# Patient Record
Sex: Female | Born: 1935 | Race: Black or African American | Hispanic: No | Smoking: Never smoker
Health system: Southern US, Community
[De-identification: ages and names within clinical notes are randomized; demographics above are authoritative.]

## PROBLEM LIST (undated history)

## (undated) DIAGNOSIS — K219 Gastro-esophageal reflux disease without esophagitis: Secondary | ICD-10-CM

## (undated) DIAGNOSIS — D649 Anemia, unspecified: Secondary | ICD-10-CM

## (undated) DIAGNOSIS — I2510 Atherosclerotic heart disease of native coronary artery without angina pectoris: Secondary | ICD-10-CM

## (undated) DIAGNOSIS — E7800 Pure hypercholesterolemia, unspecified: Secondary | ICD-10-CM

## (undated) DIAGNOSIS — I739 Peripheral vascular disease, unspecified: Secondary | ICD-10-CM

## (undated) DIAGNOSIS — K449 Diaphragmatic hernia without obstruction or gangrene: Secondary | ICD-10-CM

## (undated) DIAGNOSIS — F419 Anxiety disorder, unspecified: Secondary | ICD-10-CM

## (undated) DIAGNOSIS — K5730 Diverticulosis of large intestine without perforation or abscess without bleeding: Secondary | ICD-10-CM

## (undated) DIAGNOSIS — I4892 Unspecified atrial flutter: Secondary | ICD-10-CM

## (undated) DIAGNOSIS — I219 Acute myocardial infarction, unspecified: Secondary | ICD-10-CM

## (undated) DIAGNOSIS — J189 Pneumonia, unspecified organism: Secondary | ICD-10-CM

## (undated) DIAGNOSIS — I10 Essential (primary) hypertension: Secondary | ICD-10-CM

## (undated) DIAGNOSIS — R0602 Shortness of breath: Secondary | ICD-10-CM

## (undated) DIAGNOSIS — M1990 Unspecified osteoarthritis, unspecified site: Secondary | ICD-10-CM

## (undated) DIAGNOSIS — Z9289 Personal history of other medical treatment: Secondary | ICD-10-CM

## (undated) DIAGNOSIS — K635 Polyp of colon: Secondary | ICD-10-CM

## (undated) HISTORY — DX: Essential (primary) hypertension: I10

## (undated) HISTORY — DX: Anxiety disorder, unspecified: F41.9

## (undated) HISTORY — DX: Unspecified atrial flutter: I48.92

## (undated) HISTORY — DX: Diverticulosis of large intestine without perforation or abscess without bleeding: K57.30

## (undated) HISTORY — DX: Pure hypercholesterolemia, unspecified: E78.00

## (undated) HISTORY — DX: Peripheral vascular disease, unspecified: I73.9

## (undated) HISTORY — DX: Diaphragmatic hernia without obstruction or gangrene: K44.9

## (undated) HISTORY — DX: Anemia, unspecified: D64.9

## (undated) HISTORY — PX: HERNIA REPAIR: SHX51

## (undated) HISTORY — PX: DILATION AND CURETTAGE OF UTERUS: SHX78

## (undated) HISTORY — DX: Atherosclerotic heart disease of native coronary artery without angina pectoris: I25.10

## (undated) HISTORY — DX: Polyp of colon: K63.5

## (undated) HISTORY — DX: Unspecified osteoarthritis, unspecified site: M19.90

## (undated) HISTORY — PX: FRACTURE SURGERY: SHX138

---

## 1997-10-30 ENCOUNTER — Emergency Department (HOSPITAL_COMMUNITY): Admission: EM | Admit: 1997-10-30 | Discharge: 1997-10-30 | Payer: Self-pay | Admitting: Emergency Medicine

## 1998-06-14 ENCOUNTER — Ambulatory Visit (HOSPITAL_COMMUNITY): Admission: RE | Admit: 1998-06-14 | Discharge: 1998-06-14 | Payer: Self-pay | Admitting: Pulmonary Disease

## 1998-06-14 ENCOUNTER — Encounter: Payer: Self-pay | Admitting: Pulmonary Disease

## 1998-08-08 ENCOUNTER — Encounter (INDEPENDENT_AMBULATORY_CARE_PROVIDER_SITE_OTHER): Payer: Self-pay | Admitting: *Deleted

## 1998-11-06 ENCOUNTER — Encounter: Payer: Self-pay | Admitting: Emergency Medicine

## 1998-11-06 ENCOUNTER — Emergency Department (HOSPITAL_COMMUNITY): Admission: EM | Admit: 1998-11-06 | Discharge: 1998-11-06 | Payer: Self-pay | Admitting: *Deleted

## 1998-11-07 ENCOUNTER — Encounter: Payer: Self-pay | Admitting: Pulmonary Disease

## 1998-11-07 ENCOUNTER — Inpatient Hospital Stay (HOSPITAL_COMMUNITY): Admission: EM | Admit: 1998-11-07 | Discharge: 1998-11-11 | Payer: Self-pay | Admitting: Pulmonary Disease

## 1999-11-26 ENCOUNTER — Encounter: Payer: Self-pay | Admitting: Emergency Medicine

## 1999-11-26 ENCOUNTER — Emergency Department (HOSPITAL_COMMUNITY): Admission: EM | Admit: 1999-11-26 | Discharge: 1999-11-26 | Payer: Self-pay

## 2001-11-30 ENCOUNTER — Other Ambulatory Visit: Admission: RE | Admit: 2001-11-30 | Discharge: 2001-11-30 | Payer: Self-pay | Admitting: Obstetrics & Gynecology

## 2001-12-01 ENCOUNTER — Encounter: Admission: RE | Admit: 2001-12-01 | Discharge: 2001-12-01 | Payer: Self-pay | Admitting: Pulmonary Disease

## 2001-12-01 ENCOUNTER — Encounter: Payer: Self-pay | Admitting: Pulmonary Disease

## 2004-05-21 ENCOUNTER — Ambulatory Visit: Payer: Self-pay | Admitting: Pulmonary Disease

## 2004-10-29 ENCOUNTER — Ambulatory Visit: Payer: Self-pay | Admitting: Pulmonary Disease

## 2004-11-26 ENCOUNTER — Ambulatory Visit: Payer: Self-pay | Admitting: Pulmonary Disease

## 2004-12-18 ENCOUNTER — Ambulatory Visit: Payer: Self-pay | Admitting: Gastroenterology

## 2005-01-15 ENCOUNTER — Ambulatory Visit: Payer: Self-pay | Admitting: Gastroenterology

## 2005-01-15 ENCOUNTER — Encounter (INDEPENDENT_AMBULATORY_CARE_PROVIDER_SITE_OTHER): Payer: Self-pay | Admitting: *Deleted

## 2005-01-15 ENCOUNTER — Encounter (INDEPENDENT_AMBULATORY_CARE_PROVIDER_SITE_OTHER): Payer: Self-pay | Admitting: Specialist

## 2005-01-17 ENCOUNTER — Ambulatory Visit: Payer: Self-pay | Admitting: Pulmonary Disease

## 2005-03-08 ENCOUNTER — Ambulatory Visit: Payer: Self-pay | Admitting: Pulmonary Disease

## 2005-09-23 ENCOUNTER — Ambulatory Visit: Payer: Self-pay | Admitting: Pulmonary Disease

## 2006-03-06 ENCOUNTER — Inpatient Hospital Stay (HOSPITAL_COMMUNITY): Admission: AD | Admit: 2006-03-06 | Discharge: 2006-03-11 | Payer: Self-pay | Admitting: Pulmonary Disease

## 2006-03-06 ENCOUNTER — Encounter: Payer: Self-pay | Admitting: Cardiology

## 2006-03-06 ENCOUNTER — Ambulatory Visit: Payer: Self-pay | Admitting: Pulmonary Disease

## 2006-03-06 ENCOUNTER — Ambulatory Visit: Payer: Self-pay | Admitting: Cardiology

## 2006-03-06 ENCOUNTER — Encounter: Payer: Self-pay | Admitting: Vascular Surgery

## 2006-03-18 ENCOUNTER — Ambulatory Visit: Payer: Self-pay | Admitting: Pulmonary Disease

## 2006-05-02 ENCOUNTER — Ambulatory Visit: Payer: Self-pay | Admitting: Pulmonary Disease

## 2006-05-02 LAB — CONVERTED CEMR LAB
Albumin: 3.5 g/dL (ref 3.5–5.2)
Alkaline Phosphatase: 99 units/L (ref 39–117)
BUN: 14 mg/dL (ref 6–23)
Basophils Relative: 0.4 % (ref 0.0–1.0)
Cholesterol: 154 mg/dL (ref 0–200)
Creatinine, Ser: 0.7 mg/dL (ref 0.4–1.2)
Eosinophils Relative: 1.3 % (ref 0.0–5.0)
GFR calc Af Amer: 104 mL/min
GFR calc non Af Amer: 86 mL/min
HCT: 33.4 % — ABNORMAL LOW (ref 36.0–46.0)
Hemoglobin: 10.9 g/dL — ABNORMAL LOW (ref 12.0–15.0)
LDL Cholesterol: 84 mg/dL (ref 0–99)
MCHC: 32.6 g/dL (ref 30.0–36.0)
Neutrophils Relative %: 50.9 % (ref 43.0–77.0)
Potassium: 4 meq/L (ref 3.5–5.1)
RDW: 18.7 % — ABNORMAL HIGH (ref 11.5–14.6)
Saturation Ratios: 6.4 % — ABNORMAL LOW (ref 20.0–50.0)
Transferrin: 390.4 mg/dL — ABNORMAL HIGH (ref >=212.0)
WBC: 7.8 10*3/uL (ref 4.5–10.5)

## 2006-05-13 ENCOUNTER — Ambulatory Visit: Payer: Self-pay | Admitting: Pulmonary Disease

## 2006-05-13 LAB — CONVERTED CEMR LAB
OCCULT 1: POSITIVE — AB
OCCULT 2: POSITIVE — AB
OCCULT 3: POSITIVE — AB

## 2006-06-04 ENCOUNTER — Ambulatory Visit: Payer: Self-pay | Admitting: Gastroenterology

## 2006-06-10 ENCOUNTER — Encounter (INDEPENDENT_AMBULATORY_CARE_PROVIDER_SITE_OTHER): Payer: Self-pay | Admitting: *Deleted

## 2006-06-10 ENCOUNTER — Ambulatory Visit: Payer: Self-pay | Admitting: Gastroenterology

## 2006-06-10 ENCOUNTER — Encounter (INDEPENDENT_AMBULATORY_CARE_PROVIDER_SITE_OTHER): Payer: Self-pay | Admitting: Specialist

## 2006-07-16 ENCOUNTER — Ambulatory Visit: Payer: Self-pay | Admitting: Gastroenterology

## 2006-07-16 LAB — CONVERTED CEMR LAB
Eosinophils Absolute: 0.1 10*3/uL (ref 0.0–0.6)
Hemoglobin: 12.2 g/dL (ref 12.0–15.0)
Iron: 48 ug/dL (ref 42–145)
Lymphocytes Relative: 44.2 % (ref 12.0–46.0)
MCV: 72.5 fL — ABNORMAL LOW (ref 78.0–100.0)
Monocytes Absolute: 0.7 10*3/uL (ref 0.2–0.7)
Monocytes Relative: 9.2 % (ref 3.0–11.0)
Neutro Abs: 3.4 10*3/uL (ref 1.4–7.7)
Platelets: 227 10*3/uL (ref 150–400)

## 2006-07-29 ENCOUNTER — Ambulatory Visit: Payer: Self-pay | Admitting: Pulmonary Disease

## 2006-08-25 ENCOUNTER — Ambulatory Visit: Payer: Self-pay | Admitting: Gastroenterology

## 2006-08-25 LAB — CONVERTED CEMR LAB
OCCULT 1: NEGATIVE
OCCULT 5: NEGATIVE

## 2007-01-09 ENCOUNTER — Ambulatory Visit: Payer: Self-pay | Admitting: Pulmonary Disease

## 2007-01-09 LAB — CONVERTED CEMR LAB
ALT: 21 units/L (ref 0–35)
Albumin: 3.6 g/dL (ref 3.5–5.2)
Alkaline Phosphatase: 98 units/L (ref 39–117)
BUN: 15 mg/dL (ref 6–23)
Basophils Absolute: 0 10*3/uL (ref 0.0–0.1)
Basophils Relative: 0.6 % (ref 0.0–1.0)
CO2: 29 meq/L (ref 19–32)
Calcium: 9.3 mg/dL (ref 8.4–10.5)
Cholesterol: 161 mg/dL (ref 0–200)
Eosinophils Absolute: 0.1 10*3/uL (ref 0.0–0.6)
Ferritin: 25.8 ng/mL (ref 10.0–291.0)
GFR calc Af Amer: 89 mL/min
GFR calc non Af Amer: 74 mL/min
HDL: 61.7 mg/dL (ref >39.0)
Hemoglobin: 13.7 g/dL (ref 12.0–15.0)
Lymphocytes Relative: 41.1 % (ref 12.0–46.0)
MCHC: 33.5 g/dL (ref 30.0–36.0)
Monocytes Absolute: 0.6 10*3/uL (ref 0.2–0.7)
Monocytes Relative: 8.1 % (ref 3.0–11.0)
Neutro Abs: 3.7 10*3/uL (ref 1.4–7.7)
Platelets: 176 10*3/uL (ref 150–400)
Potassium: 4.3 meq/L (ref 3.5–5.1)
Saturation Ratios: 12.5 % — ABNORMAL LOW (ref 20.0–50.0)
TSH: 1.34 microintl units/mL (ref 0.35–5.50)
Total Protein: 7.4 g/dL (ref 6.0–8.3)
Transferrin: 320 mg/dL (ref >=212.0)
Triglycerides: 39 mg/dL (ref 0–149)
VLDL: 8 mg/dL (ref 0–40)

## 2007-03-16 DIAGNOSIS — I10 Essential (primary) hypertension: Secondary | ICD-10-CM

## 2007-03-16 DIAGNOSIS — E7800 Pure hypercholesterolemia, unspecified: Secondary | ICD-10-CM

## 2007-03-16 DIAGNOSIS — M1990 Unspecified osteoarthritis, unspecified site: Secondary | ICD-10-CM

## 2007-03-16 DIAGNOSIS — D649 Anemia, unspecified: Secondary | ICD-10-CM

## 2007-03-16 DIAGNOSIS — D126 Benign neoplasm of colon, unspecified: Secondary | ICD-10-CM

## 2007-03-16 DIAGNOSIS — I2510 Atherosclerotic heart disease of native coronary artery without angina pectoris: Secondary | ICD-10-CM | POA: Insufficient documentation

## 2007-03-16 DIAGNOSIS — F411 Generalized anxiety disorder: Secondary | ICD-10-CM

## 2007-03-16 DIAGNOSIS — K449 Diaphragmatic hernia without obstruction or gangrene: Secondary | ICD-10-CM

## 2007-03-16 DIAGNOSIS — I739 Peripheral vascular disease, unspecified: Secondary | ICD-10-CM

## 2007-05-19 ENCOUNTER — Ambulatory Visit: Payer: Self-pay | Admitting: Pulmonary Disease

## 2007-05-25 ENCOUNTER — Telehealth (INDEPENDENT_AMBULATORY_CARE_PROVIDER_SITE_OTHER): Payer: Self-pay | Admitting: *Deleted

## 2007-05-26 ENCOUNTER — Encounter: Payer: Self-pay | Admitting: Adult Health

## 2007-05-26 ENCOUNTER — Ambulatory Visit: Payer: Self-pay | Admitting: Pulmonary Disease

## 2007-05-28 LAB — CONVERTED CEMR LAB
Bilirubin Urine: NEGATIVE
Crystals: NEGATIVE
Ketones, ur: NEGATIVE mg/dL
Leukocytes, UA: NEGATIVE
Nitrite: NEGATIVE
Specific Gravity, Urine: 1.02 (ref 1.000–1.03)
Urine Glucose: NEGATIVE mg/dL
Urobilinogen, UA: 0.2 (ref 0.0–1.0)

## 2007-06-26 ENCOUNTER — Encounter: Payer: Self-pay | Admitting: Pulmonary Disease

## 2007-07-09 ENCOUNTER — Ambulatory Visit: Payer: Self-pay | Admitting: Pulmonary Disease

## 2007-07-09 DIAGNOSIS — K5730 Diverticulosis of large intestine without perforation or abscess without bleeding: Secondary | ICD-10-CM

## 2007-07-09 LAB — CONVERTED CEMR LAB
Basophils Relative: 0.3 % (ref 0.0–1.0)
Bilirubin, Direct: 0.1 mg/dL (ref 0.0–0.3)
Calcium: 9.6 mg/dL (ref 8.4–10.5)
GFR calc Af Amer: 89 mL/min
GFR calc non Af Amer: 73 mL/min
HCT: 43.7 % (ref 36.0–46.0)
Hemoglobin: 14.2 g/dL (ref 12.0–15.0)
Lymphocytes Relative: 43.7 % (ref 12.0–46.0)
Monocytes Relative: 6.2 % (ref 3.0–12.0)
Potassium: 3.8 meq/L (ref 3.5–5.1)
RBC: 5.55 M/uL — ABNORMAL HIGH (ref 3.87–5.11)
Sed Rate: 30 mm/hr — ABNORMAL HIGH (ref 0–22)
Sodium: 142 meq/L (ref 135–145)
TSH: 1.57 microintl units/mL (ref 0.35–5.50)
Total Bilirubin: 0.9 mg/dL (ref 0.3–1.2)
Total CHOL/HDL Ratio: 2.8
VLDL: 14 mg/dL (ref 0–40)

## 2007-09-11 LAB — CONVERTED CEMR LAB: Vit D, 1,25-Dihydroxy: 9 — ABNORMAL LOW (ref 30–89)

## 2007-11-23 ENCOUNTER — Telehealth: Payer: Self-pay | Admitting: Pulmonary Disease

## 2007-12-09 ENCOUNTER — Ambulatory Visit: Payer: Self-pay | Admitting: Pulmonary Disease

## 2007-12-14 ENCOUNTER — Encounter: Payer: Self-pay | Admitting: Pulmonary Disease

## 2007-12-15 ENCOUNTER — Telehealth (INDEPENDENT_AMBULATORY_CARE_PROVIDER_SITE_OTHER): Payer: Self-pay | Admitting: *Deleted

## 2007-12-31 ENCOUNTER — Telehealth: Payer: Self-pay | Admitting: Pulmonary Disease

## 2008-01-07 ENCOUNTER — Telehealth: Payer: Self-pay | Admitting: Physician Assistant

## 2008-01-13 ENCOUNTER — Ambulatory Visit: Payer: Self-pay | Admitting: Pulmonary Disease

## 2008-01-14 ENCOUNTER — Encounter: Payer: Self-pay | Admitting: Pulmonary Disease

## 2008-01-26 ENCOUNTER — Telehealth: Payer: Self-pay | Admitting: Pulmonary Disease

## 2008-02-02 ENCOUNTER — Inpatient Hospital Stay (HOSPITAL_COMMUNITY): Admission: RE | Admit: 2008-02-02 | Discharge: 2008-02-08 | Payer: Self-pay | Admitting: Orthopedic Surgery

## 2008-02-02 HISTORY — PX: TOTAL HIP ARTHROPLASTY: SHX124

## 2008-03-04 DIAGNOSIS — I219 Acute myocardial infarction, unspecified: Secondary | ICD-10-CM

## 2008-03-04 HISTORY — DX: Acute myocardial infarction, unspecified: I21.9

## 2008-04-04 HISTORY — PX: CORONARY ANGIOPLASTY WITH STENT PLACEMENT: SHX49

## 2008-04-06 ENCOUNTER — Ambulatory Visit: Payer: Self-pay | Admitting: Pulmonary Disease

## 2008-04-08 ENCOUNTER — Telehealth: Payer: Self-pay | Admitting: Pulmonary Disease

## 2008-04-11 LAB — CONVERTED CEMR LAB
Basophils Absolute: 0 10*3/uL (ref 0.0–0.1)
CO2: 30 meq/L (ref 19–32)
Chloride: 103 meq/L (ref 96–112)
Lymphocytes Relative: 35.9 % (ref 12.0–46.0)
MCHC: 32.9 g/dL (ref 30.0–36.0)
Neutrophils Relative %: 55.9 % (ref 43.0–77.0)
Pro B Natriuretic peptide (BNP): 221 pg/mL — ABNORMAL HIGH (ref 0.0–100.0)
RBC: 5.2 M/uL — ABNORMAL HIGH (ref 3.87–5.11)
RDW: 15.7 % — ABNORMAL HIGH (ref 11.5–14.6)
Sodium: 141 meq/L (ref 135–145)
TSH: 0.99 microintl units/mL (ref 0.35–5.50)

## 2008-04-14 ENCOUNTER — Telehealth (INDEPENDENT_AMBULATORY_CARE_PROVIDER_SITE_OTHER): Payer: Self-pay | Admitting: *Deleted

## 2008-04-18 ENCOUNTER — Telehealth: Payer: Self-pay | Admitting: Pulmonary Disease

## 2008-04-18 ENCOUNTER — Inpatient Hospital Stay (HOSPITAL_COMMUNITY): Admission: EM | Admit: 2008-04-18 | Discharge: 2008-04-22 | Payer: Self-pay | Admitting: Emergency Medicine

## 2008-04-24 ENCOUNTER — Emergency Department (HOSPITAL_COMMUNITY): Admission: EM | Admit: 2008-04-24 | Discharge: 2008-04-24 | Payer: Self-pay | Admitting: *Deleted

## 2008-07-11 ENCOUNTER — Ambulatory Visit: Payer: Self-pay | Admitting: Pulmonary Disease

## 2008-07-11 DIAGNOSIS — I4892 Unspecified atrial flutter: Secondary | ICD-10-CM | POA: Insufficient documentation

## 2008-07-25 ENCOUNTER — Telehealth (INDEPENDENT_AMBULATORY_CARE_PROVIDER_SITE_OTHER): Payer: Self-pay | Admitting: *Deleted

## 2008-07-26 ENCOUNTER — Ambulatory Visit: Payer: Self-pay | Admitting: Pulmonary Disease

## 2008-07-26 DIAGNOSIS — M545 Low back pain, unspecified: Secondary | ICD-10-CM | POA: Insufficient documentation

## 2008-07-26 DIAGNOSIS — E559 Vitamin D deficiency, unspecified: Secondary | ICD-10-CM

## 2008-08-16 ENCOUNTER — Telehealth (INDEPENDENT_AMBULATORY_CARE_PROVIDER_SITE_OTHER): Payer: Self-pay | Admitting: *Deleted

## 2008-11-29 ENCOUNTER — Telehealth: Payer: Self-pay | Admitting: Pulmonary Disease

## 2008-11-30 ENCOUNTER — Telehealth: Payer: Self-pay | Admitting: Adult Health

## 2008-11-30 ENCOUNTER — Ambulatory Visit: Payer: Self-pay | Admitting: Pulmonary Disease

## 2008-12-20 ENCOUNTER — Encounter (INDEPENDENT_AMBULATORY_CARE_PROVIDER_SITE_OTHER): Payer: Self-pay | Admitting: *Deleted

## 2009-01-25 ENCOUNTER — Inpatient Hospital Stay (HOSPITAL_COMMUNITY): Admission: EM | Admit: 2009-01-25 | Discharge: 2009-01-30 | Payer: Self-pay | Admitting: Emergency Medicine

## 2009-01-25 ENCOUNTER — Ambulatory Visit: Payer: Self-pay | Admitting: Internal Medicine

## 2009-01-25 ENCOUNTER — Encounter (INDEPENDENT_AMBULATORY_CARE_PROVIDER_SITE_OTHER): Payer: Self-pay | Admitting: *Deleted

## 2009-01-30 ENCOUNTER — Encounter (INDEPENDENT_AMBULATORY_CARE_PROVIDER_SITE_OTHER): Payer: Self-pay | Admitting: Internal Medicine

## 2009-01-30 ENCOUNTER — Encounter: Payer: Self-pay | Admitting: Gastroenterology

## 2009-01-31 ENCOUNTER — Telehealth (INDEPENDENT_AMBULATORY_CARE_PROVIDER_SITE_OTHER): Payer: Self-pay | Admitting: *Deleted

## 2009-02-08 ENCOUNTER — Encounter: Payer: Self-pay | Admitting: Gastroenterology

## 2009-02-10 ENCOUNTER — Ambulatory Visit: Payer: Self-pay | Admitting: Pulmonary Disease

## 2009-02-11 LAB — CONVERTED CEMR LAB
Basophils Relative: 0.7 % (ref 0.0–3.0)
Chloride: 105 meq/L (ref 96–112)
Creatinine, Ser: 1.1 mg/dL (ref 0.4–1.2)
Eosinophils Relative: 2.2 % (ref 0.0–5.0)
HCT: 33.1 % — ABNORMAL LOW (ref 36.0–46.0)
MCV: 81 fL (ref 78.0–100.0)
Monocytes Relative: 9.7 % (ref 3.0–12.0)
Neutrophils Relative %: 52.4 % (ref 43.0–77.0)
Potassium: 4.3 meq/L (ref 3.5–5.1)
RBC: 4.09 M/uL (ref 3.87–5.11)
WBC: 8.2 10*3/uL (ref 4.5–10.5)

## 2009-02-20 ENCOUNTER — Telehealth: Payer: Self-pay | Admitting: Internal Medicine

## 2009-02-21 ENCOUNTER — Ambulatory Visit: Payer: Self-pay | Admitting: Internal Medicine

## 2009-05-23 ENCOUNTER — Ambulatory Visit (HOSPITAL_COMMUNITY): Admission: RE | Admit: 2009-05-23 | Discharge: 2009-05-23 | Payer: Self-pay | Admitting: Family Medicine

## 2009-06-14 ENCOUNTER — Telehealth (INDEPENDENT_AMBULATORY_CARE_PROVIDER_SITE_OTHER): Payer: Self-pay | Admitting: *Deleted

## 2009-06-16 ENCOUNTER — Encounter: Admission: RE | Admit: 2009-06-16 | Discharge: 2009-06-16 | Payer: Self-pay | Admitting: Cardiology

## 2009-06-28 ENCOUNTER — Encounter (HOSPITAL_COMMUNITY): Admission: RE | Admit: 2009-06-28 | Discharge: 2009-09-05 | Payer: Self-pay | Admitting: Cardiology

## 2009-07-13 ENCOUNTER — Telehealth: Payer: Self-pay | Admitting: Internal Medicine

## 2009-07-13 ENCOUNTER — Telehealth: Payer: Self-pay | Admitting: Pulmonary Disease

## 2009-07-13 ENCOUNTER — Telehealth: Payer: Self-pay | Admitting: Adult Health

## 2009-08-28 ENCOUNTER — Encounter: Admission: RE | Admit: 2009-08-28 | Discharge: 2009-08-28 | Payer: Self-pay | Admitting: Orthopedic Surgery

## 2009-09-29 ENCOUNTER — Inpatient Hospital Stay (HOSPITAL_COMMUNITY): Admission: EM | Admit: 2009-09-29 | Discharge: 2009-10-04 | Payer: Self-pay | Admitting: Emergency Medicine

## 2009-10-19 ENCOUNTER — Ambulatory Visit: Payer: Self-pay | Admitting: Pulmonary Disease

## 2009-11-02 ENCOUNTER — Encounter (HOSPITAL_COMMUNITY): Admission: RE | Admit: 2009-11-02 | Discharge: 2009-12-01 | Payer: Self-pay | Admitting: Cardiology

## 2009-11-23 ENCOUNTER — Telehealth (INDEPENDENT_AMBULATORY_CARE_PROVIDER_SITE_OTHER): Payer: Self-pay | Admitting: *Deleted

## 2009-11-24 ENCOUNTER — Ambulatory Visit: Payer: Self-pay | Admitting: Pulmonary Disease

## 2009-12-02 ENCOUNTER — Encounter (HOSPITAL_COMMUNITY)
Admission: RE | Admit: 2009-12-02 | Discharge: 2010-03-02 | Payer: Self-pay | Source: Home / Self Care | Attending: Cardiology | Admitting: Cardiology

## 2010-03-05 ENCOUNTER — Emergency Department (HOSPITAL_COMMUNITY)
Admission: EM | Admit: 2010-03-05 | Discharge: 2010-03-05 | Payer: Self-pay | Source: Home / Self Care | Admitting: Family Medicine

## 2010-03-06 ENCOUNTER — Telehealth (INDEPENDENT_AMBULATORY_CARE_PROVIDER_SITE_OTHER): Payer: Self-pay | Admitting: *Deleted

## 2010-03-07 ENCOUNTER — Telehealth (INDEPENDENT_AMBULATORY_CARE_PROVIDER_SITE_OTHER): Payer: Self-pay | Admitting: *Deleted

## 2010-03-07 ENCOUNTER — Encounter: Payer: Self-pay | Admitting: Pulmonary Disease

## 2010-03-09 ENCOUNTER — Ambulatory Visit
Admission: RE | Admit: 2010-03-09 | Discharge: 2010-03-09 | Payer: Self-pay | Source: Home / Self Care | Attending: Pulmonary Disease | Admitting: Pulmonary Disease

## 2010-03-16 ENCOUNTER — Encounter (HOSPITAL_COMMUNITY)
Admission: RE | Admit: 2010-03-16 | Discharge: 2010-04-03 | Payer: Self-pay | Source: Home / Self Care | Attending: Cardiology | Admitting: Cardiology

## 2010-04-01 LAB — CONVERTED CEMR LAB
ALT: 19 units/L (ref 0–35)
AST: 26 units/L (ref 0–37)
Albumin: 4.1 g/dL (ref 3.5–5.2)
BUN: 11 mg/dL (ref 6–23)
Basophils Relative: 0.8 % (ref 0.0–3.0)
Chloride: 105 meq/L (ref 96–112)
Cholesterol: 194 mg/dL (ref 0–200)
Creatinine, Ser: 0.8 mg/dL (ref 0.4–1.2)
Eosinophils Absolute: 0.1 10*3/uL (ref 0.0–0.7)
Eosinophils Relative: 0.9 % (ref 0.0–5.0)
GFR calc non Af Amer: 73 mL/min
HCT: 42.9 % (ref 36.0–46.0)
Hemoglobin: 13.8 g/dL (ref 12.0–15.0)
MCV: 79.3 fL (ref 78.0–100.0)
Neutrophils Relative %: 48.9 % (ref 43.0–77.0)
RBC: 5.41 M/uL — ABNORMAL HIGH (ref 3.87–5.11)
Total CHOL/HDL Ratio: 3.2
Triglycerides: 86 mg/dL (ref 0–149)
WBC: 7.7 10*3/uL (ref 4.5–10.5)
aPTT: 27.8 s (ref 21.7–29.8)

## 2010-04-04 ENCOUNTER — Ambulatory Visit (HOSPITAL_COMMUNITY): Payer: MEDICARE | Attending: Cardiology

## 2010-04-05 NOTE — Assessment & Plan Note (Signed)
Summary: fever and cough//lmr   CC:  1 month ROV & add-on cough cough....  History of Present Illness: 77 y/o BF here for a follow up visit... she has mult med problems as noted below...    ~  Feb10:  Virginia Casey had an MI- eval & Rx by [facility]- cath report in [place] is incomplete dictation, DC Summary indicates a non-STEMI w/ PTCA & stent to the LAD (?status of the other vessels), paroxysmal AFlutter w/ 2:1 block, HBP & Hypercholesterolemia... all new meds including: ASA, Plavix, Amiodarone, Toprol XL, Altace, Crestor, Pepcid, & NTG... she indicates that she's had a f/u scan ?Myoview, but she doesn't know the results and we don't have DrHarwani's notes... she feels that she is stable at present and she will inquire about cardiac rehab...   ~  Jul 26, 2008:  pt called for add-on appt w/ 1 week hx LBP- no known trauma, etc... Intermittent 6/10 pain in low back & across to right pelvis and hip area (she had right THR 12/09 by DrDean)... ?ppt factors/ may last all day/ ?alleviating factors- tried 1/2 Vicodin w/ some improvement... hasn't tried heat or the Methocarbamol Rx... we discussed the needed eval & rec refer to Ortho ASAP for XRays etc... Rx= rest, heat, icey hott, Vicodin, Robaxin, etc...   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 ASA/ Plavix held, continued on Pepcid 20mg Bid, conserv Rx & improved... colonoscopy 11/29 DrJacobs showed severe divertics, one 7mm polyp removed, no bleeding seen... since disch feeling better- f/u DrPerry for GI> Labs today show Hg=10.6, MCV= 81... rec starting FeSO4... she will f/u w/ her cardiologist, DrHarwani.   ~  October 19, 2009:  she was hosp 7/29 - 10/04/09 by Maia Breslow for CP w/ Myoview showing ant ischemia, norm wall motion & EF= 82%;  cath showed 70% midLAD in-stent restenosis, otherw  non-obstructive dis seen in other vessels;  subseq PTCA was successful & she was placed on PLAVIX x77mo (plus her ASA)... her DC meds were changed to ASA/ PLAVIX, RAMIPRIL, IMDUR, AMIO, CRESTOR, PERCOCET...  we reviewed hosp records> CXR, Labs, etc (see below)... Since hosp she tells me she stopped the Isosorbide on her own due to HA & "makes me sick";  since then her BP is sl higher  ~150/90, & she has a call into DrHarwani to see what to do...   ~  November 24, 2009:  add-on visit for cough x several days> states onset URI w/ cough, yellow sputum, low grade fever, congestion, and bilat rib pain from the cough... no hemoptysis, ch in dyspnea, angina CP, etc...  taking Tylenol, fluids, Robitussin... we discussed checking CXR & Rx w/ Depo80, Pred Dosepak, Augmentin, Mucinex, & Tramadol...    Current Problem List:    ASTHMATIC BRONCHITIS (ICD-466.0) - this is her first exac in several months...  HYPERTENSION (ICD-401.9) - on RAMIPRIL 5mg Bid + IMDUR 60/d (but not taking at present due to HA) per [facility]... BP= 144/70 & not checking BP's at home... she notes HA; but denies visual changes, CP, palipit, syncope, dyspnea, edema, etc...  ATHEROSCLEROTIC HEART DISEASE (ICD-414.00) & PERIPHERAL VASCULAR DISEASE (ICD-443.9) - regularly on ASA 325mg /d, & recent PLAVIX 75mg  due to PTCA... followed by [facility] for Cards (we do not have his notes to review med changes)...  ~  hosp in Jan08 w/ syncope and MRI Brain showed mild atrophy & sm vessel dis;  MRA showed basilar art narrowing & mod stenosis in RICA cavernous segment... CDopplers however were WNL w/ antegrade vertebral flow, and no signif ICA stenoses detected...  ~  hosp Feb10 by [facility] w/ non-STEMI- s/p PTCA and stent in the LAD.  ~  hosp 7/11 by [facility] w/ CP, Abn Myoview & cath w/ 70% midLAD in-stent resteosis; s/p PTCA & started back on Plavix x 79mo.  ATRIAL FLUTTER, PAROXYSMAL (ICD-427.32) - this occured 2/10 following her cath, PTCA, stent...  he diagnosed flutter w/ 2:1 block... currently taking AMIODARONE 200mg - 1/2 tab daily...  HYPERCHOLESTEROLEMIA (ICD-272.0) - on CRESTOR 20mg /d... weight is 179#.[name].  ~  FLP 11/08 showed TChol 161, TG 39, HDL 62, LDL 92... continue same med.  ~  FLP 5/09 showed TChol 209, TG 70, HDL 74, LDL 124... rec- better diet, get weight down!  ~  FLP in hosp 2/10 showed TChol 130, TG 69, HDL 45, LDL 71  ~  she reports that [facility] does fasting labs...  ~  FLP 7/11 in hosp showed TChol 105, TG 30, HDL 66, LDL 33  HIATAL HERNIA (ICD-553.3) - prev on Nexium & Carafate Liq- but changed to PEPCID 20mg Bid by [facility] due to need for Plavix... she has a giant HH measuring 10cm per DrSam, w/ erosions in the pouch... last EGD by DrSam 4/08... hernia is seen on her CXRs.  DIVERTICULOSIS OF COLON (ICD-562.10) & COLONIC POLYPS (ICD-211.3)  ~  colonoscopy 11/06 showed divertics and 2- 4mm polyps... f/u planned in 4-32yrs...  ~  [facility] 11/10 w/ lower GIb believed due to divertics... colonoscopy by DrJacobs showed severe divertics, no bleeding site, 7mm polyp removed...  DEGENERATIVE JOINT DISEASE (ICD-715.90) & HIP PAIN, RIGHT (ICD-719.45) - currently taking OTC meds only but DrDean rec VICODIN for as needed use... Eval by DrDean 3/09 w/ bone-on-bone right hip, tried injection & pain meds... right THR done 12/09...  ANXIETY (ICD-300.00) - prev on Chlorazepate but currently not taking anything for nerves...  ANEMIA (ICD-285.9) - prev on Niferex 150mg /d... 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 Rx w/ iron tabs...   ~  last blood count 11/08 showed Hg=13.7, Fe=56...  ~  labs 5/09 showed Hg= 14.2.[name].  ~  labs 2/10 in hosp showed Hg= 13  ~  [facility] 11/10 w/ lower GIB, & Hg down to 8.6- tc 1u PC up to 9.3.Marland KitchenMarland Kitchen  ~  labs 12/10 showed Hg= 10.6.[name]. rec> continue oral Fe w/ VitC...  ~  [facility] labs 7/11 showed Hg= 11.6 - 10.5, MCV= 70... rec> restart Fe.  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   Preventive Screening-Counseling & Management  Alcohol-Tobacco     Smoking Status: never  Allergies (verified): No Known Drug Allergies  Comments:  Nurse/Medical Assistant: The patient's medications and allergies were reviewed with the patient and were updated in the Medication and Allergy Lists.  Past History:  Past Medical History: ASTHMATIC BRONCHITIS, ACUTE (ICD-466.0) HYPERTENSION (ICD-401.9) ATHEROSCLEROTIC HEART DISEASE (ICD-414.00) ATRIAL FLUTTER, PAROXYSMAL (ICD-427.32) PERIPHERAL VASCULAR DISEASE (ICD-443.9) HYPERCHOLESTEROLEMIA (ICD-272.0) HIATAL HERNIA (ICD-553.3) DIVERTICULOSIS OF COLON (ICD-562.10) COLONIC POLYPS (ICD-211.3) Tubular Adenoma DEGENERATIVE JOINT DISEASE (ICD-715.90) HIP PAIN, RIGHT (ICD-719.45) VITAMIN D DEFICIENCY (ICD-268.9) ANXIETY (ICD-300.00) ANEMIA (ICD-285.9)  Past Surgical History: S/P right THR 12/09 by DrDean S/P PTCA & stent to LAD 2/10 by DrHarwani S/P PTCA to LAD in-stent  restenosis 7/11 by [facility]  Family History: Reviewed history from 10/19/2009 and no changes required. heart disease - mother, MGM, MGF rheumatism - mother, father cancer - father (lung) stroke -  mat aunt  Social History: Reviewed history from 10/19/2009 and no changes required. Never Smoked no alcohol no caffeine lives with granddaughter 3 children, 1 has passed widow  Review of Systems      See HPI       The patient complains of dyspnea on exertion and prolonged cough.  The patient denies anorexia, fever, weight loss, weight gain, vision loss, decreased hearing, hoarseness, chest pain, syncope, peripheral edema, headaches, hemoptysis, abdominal pain, melena, hematochezia, severe indigestion/heartburn, hematuria, incontinence, muscle weakness, suspicious skin lesions, transient blindness, difficulty walking, depression, unusual weight change, abnormal bleeding, enlarged lymph nodes, and angioedema.    Vital  Signs:  Patient profile:   77 year old female Height:      62 inches Weight:      178.25 pounds BMI:     32.72 O2 Sat:      96 % on Room air Temp:     99.7 degrees F oral Pulse rate:   76 / minute BP sitting:   144 / 70  (right arm) Cuff size:   regular  Vitals Entered By: Randell Loop CMA (November 24, 2009 12:01 PM)  O2 Sat at Rest %:  96 O2 Flow:  Room air CC: 1 month ROV & add-on cough cough... Is Patient Diabetic? No Pain Assessment Patient in pain? yes      Onset of pain  pain from couging  Comments meds updated today with pt   Physical Exam  Additional Exam:  WD, WN, 77y/o BF in NAD... GENERAL:  Alert & oriented; pleasant & cooperative... HEENT:  [place]/AT, EOM-wnl, EACs-clear, TMs-wnl, NOSE-clear, THROAT-clear & wnl. NECK:  Supple w/ fairROM; no JVD; normal carotid impulses w/o bruits; no thyromegaly or nodules palpated; no lymphadenopathy. CHEST:  Coarse BS w/ no wheezing, no rales, no signs of consolidation. HEART:  Regular Rhythm; gr 1/6 SEM without rubs or gallops detected... ABDOMEN:  Soft & nontender; normal bowel sounds; no organomegaly or masses palpated... EXT: without deformities, mod arthritic changes; s/p right THR; no varicose veins/ +venous insuffic/  tr edema . BACK:  sl tender along right PSIS area... NEURO:  CN's intact; no focal neuro defict... DERM:  no lesions seen...    Impression & Recommendations:  Problem # 1:  ACUTE BRONCHITIS (ICD-466.0) She has bronchitis and an AB exac... Rx w/ Augmentin for the acute infection, Depo80 +Dosepak for the inflammation, Mucinex/ fluids/ etc for the congestion, Tramadol for the pain... Her updated medication list for this problem includes:    Robitussin Maximum Strength 15 Mg/32ml Syrp (Dextromethorphan hbr) .[name]... As needed for cough    Augmentin 875-125 Mg Tabs (Amoxicillin-pot clavulanate) .[name]... Take 1 tab by mouth two times a day til gone...  Orders: T-2 View CXR (71020TC) Depo- Medrol 80mg   (J1040) Admin of Therapeutic Inj  intramuscular or subcutaneous (04540)  Problem # 2:  HYPERTENSION (ICD-401.9) Controlled>  same meds. Her updated medication list for this problem includes:    Ramipril 5 Mg Caps (Ramipril) .[name]... Take 1 cap by mouth two times a day...  Problem # 3:  ATHEROSCLEROTIC HEART DISEASE (ICD-414.00) Managed by DrHarwani> we do not have his notes to review... Her updated medication list for this problem includes:    Aspir-low 81 Mg Tbec (Aspirin) ..... Once daily    Isosorbide Mononitrate Cr 60 Mg Xr24h-tab (  Isosorbide mononitrate) .[name]... Take 1 tab by mouth once daily    Nitrostat 0.4 Mg Subl (Nitroglycerin) ..... Use as directed    Ramipril 5 Mg Caps (Ramipril) .[name]... Take 1 cap by mouth two times a day...    Amiodarone Hcl 200 Mg Tabs (Amiodarone hcl) .[name]... Take 1/2 tab by mouth once daily...  Problem # 4:  HYPERCHOLESTEROLEMIA (ICD-272.0) On Crestor per DrHarwani>  continue same. Her updated medication list for this problem includes:    Crestor 20 Mg Tabs (Rosuvastatin calcium) .[name]... Take 1 tab by mouth once daily...  Problem # 5:  HIATAL HERNIA (ICD-553.3) GI is stable>  continue current Rx... Her updated medication list for this problem includes:    Pepcid 20 Mg Tabs (Famotidine) .[name]... Take 1 tab by mouth two times a day...  Problem # 6:  DEGENERATIVE JOINT DISEASE (ICD-715.90) Managed by DrDean for Ortho... Her updated medication list for this problem includes:    Aspir-low 81 Mg Tbec (Aspirin) ..... Once daily    Oxycodone-acetaminophen 5-325 Mg Tabs (Oxycodone-acetaminophen) .[name]... Take one tablet by mouth two times a day    Tylenol 325 Mg Tabs (Acetaminophen) .[name]... As needed for pain or fever    Tramadol Hcl 50 Mg Tabs (Tramadol hcl) .[name]... Take 1 tab every 6 h as needed for pain...  Problem # 7:  ANEMIA (ICD-285.9) She is back on the Fe & we plan f/u labs on return... Her updated medication list for this problem includes:    Ferrous Sulfate 325  (65 Fe) Mg Tabs (Ferrous sulfate) .[name]... Take 1 tab by mouth once daily (take w/ a 500mg  vit c tab)...  Complete Medication List: 1)  Aspir-low 81 Mg Tbec (Aspirin) .... Once daily 2)  Isosorbide Mononitrate Cr 60 Mg Xr24h-tab (Isosorbide mononitrate) .... Take 1 tab by mouth once daily 3)  Nitrostat 0.4 Mg Subl (Nitroglycerin) .... Use as directed 4)  Ramipril 5 Mg Caps (Ramipril) .... Take 1 cap by mouth two times a day... 5)  Amiodarone Hcl 200 Mg Tabs (Amiodarone hcl) .... Take 1/2 tab by mouth once daily.Marland KitchenMarland Kitchen 6)  Crestor 20 Mg Tabs (Rosuvastatin calcium) .... Take 1 tab by mouth once daily.Marland KitchenMarland Kitchen 7)  Pepcid 20 Mg Tabs (Famotidine) .... Take 1 tab by mouth two times a day... 8)  Oxycodone-acetaminophen 5-325 Mg Tabs (Oxycodone-acetaminophen) .... Take one tablet by mouth two times a day 9)  Ferrous Sulfate 325 (65 Fe) Mg Tabs (Ferrous sulfate) .... Take 1 tab by mouth once daily (take w/ a 500mg  vit c tab)... 10)  Tylenol 325 Mg Tabs (Acetaminophen) .... As needed for pain or fever 11)  Robitussin Maximum Strength 15 Mg/69ml Syrp (Dextromethorphan hbr) .... As needed for cough 12)  Augmentin 875-125 Mg Tabs (Amoxicillin-pot clavulanate) .... Take 1 tab by mouth two times a day til gone... 13)  Prednisone (pak) 5 Mg Tabs (Prednisone) .... Take as directed starting 11/25/09 am... 14)  Tramadol Hcl 50 Mg Tabs (Tramadol hcl) .... Take 1 tab every 6 h as needed for pain...  Patient Instructions: 1)  Today we updated your med list- see below.... 2)  For your Bronchitis:  Increase your fluid intake & start the [facility] DM 1-2 tabs twice daily... start the new AUGMENTIN antibiotic twice daily til gone... and start the Pred Dosepak in the AM 9/24 as directed... 3)  You may take the TRAMADOL for the pain (every 6H as needed & take it with an extra strength Tylenol)... 4)  Today we did your follow up  CXR to be sure there is no pneumonia... call the phone tree for your XRay results.Marland KitchenMarland Kitchen 5)  Rest at home & take it  easy... Prescriptions: TRAMADOL HCL 50 MG TABS (TRAMADOL HCL) take 1 tab every 6 H as needed for pain...  #50 x 5   Entered and Authorized by:   Michele Mcalpine MD   Signed by:   Michele Mcalpine MD on 11/24/2009   Method used:   Print then Give to Patient   RxID:   [id number] PREDNISONE (PAK) 5 MG TABS (PREDNISONE) take as directed starting 11/25/09 AM...  #5mg -6d pack x 0   Entered and Authorized by:   Michele Mcalpine MD   Signed by:   Michele Mcalpine MD on 11/24/2009   Method used:   Print then Give to Patient   RxID:   [id number] AUGMENTIN 875-125 MG TABS (AMOXICILLIN-POT CLAVULANATE) take 1 tab by mouth two times a day til gone...  #14 x 1   Entered and Authorized by:   Michele Mcalpine MD   Signed by:   Michele Mcalpine MD on 11/24/2009   Method used:   Print then Give to Patient   RxID:   [id number]    Immunization History:  Influenza Immunization History:    Influenza:  historical (09/20/2009)  Pneumovax Immunization History:    Pneumovax:  historical (05/23/2009)    Medication Administration  Injection # 1:    Medication: Depo- Medrol 80mg     Diagnosis: ACUTE BRONCHITIS (ICD-466.0)    Route: IM    Site: RUOQ gluteus    Exp Date: 06/2012    Lot #: [id number]    Mfr: [employer]    Patient tolerated injection without complications    Given by: Randell Loop CMA (November 24, 2009 12:44 PM)  Orders Added: 1)  Est. Patient Level III [53664] 2)  T-2 View CXR [71020TC] 3)  Depo- Medrol 80mg  [J1040] 4)  Admin of Therapeutic Inj  intramuscular or subcutaneous [40347]

## 2010-04-05 NOTE — Assessment & Plan Note (Signed)
Summary: rov/mbw   CC:  8 month ROV & hosp follow up visit....  History of Present Illness: 77 y/o BF here for a follow up visit... she has mult med problems as noted below...    ~  Feb10:  Inza had an MI- eval & Rx by [facility]- cath report in [place] is incomplete dictation, DC Summary indicates a non-STEMI w/ PTCA & stent to the LAD (?status of the other vessels), paroxysmal AFlutter w/ 2:1 block, HBP & Hypercholesterolemia... all new meds including: ASA, Plavix, Amiodarone, Toprol XL, Altace, Crestor, Pepcid, & NTG... she indicates that she's had a f/u scan ?Myoview, but she doesn't know the results and we don't have DrHarwani's notes... she feels that she is stable at present and she will inquire about cardiac rehab...   ~  Jul 26, 2008:  pt called for add-on appt w/ 1 week hx LBP- no known trauma, etc... Intermittent 6/10 pain in low back & across to right pelvis and hip area (she had right THR 12/09 by DrDean)... ?ppt factors/ may last all day/ ?alleviating factors- tried 1/2 Vicodin w/ some improvement... hasn't tried heat or the Methocarbamol Rx... we discussed the needed eval & rec refer to Ortho ASAP for XRays etc... Rx= rest, heat, icey hott, Vicodin, Robaxin, etc...   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 ASA/ Plavix held, continued on Pepcid 20mg Bid, conserv Rx & improved... colonoscopy 11/29 DrJacobs showed severe divertics, one 7mm polyp removed, no bleeding seen... since disch feeling better- f/u DrPerry for GI> Labs today show Hg=10.6, MCV= 81... rec starting FeSO4... she will f/u w/ her cardiologist, DrHarwani.   ~  October 19, 2009:  she was hosp 7/29 - 10/04/09 by [facility] for CP w/ Myoview showing ant ischemia, norm wall motion & EF= 82%;  cath showed 70% midLAD in-stent restenosis, otherw non-obstructive  dis seen in other vessels;  subseq PTCA was successful & she was placed on PLAVIX x30mo (plus her ASA)... her DC meds were changed to ASA/ PLAVIX, RAMIPRIL, IMDUR, AMIO, CRESTOR, PERCOCET...  we reviewed hosp records> CXR, Labs, etc (see below)... Since hosp she tells me she stopped the Isosorbide on her own due to HA & "makes me sick";  since then her BP is sl higher  ~150/90, & she has a call into DrHarwani to see what to do...    Current Problem List:    ASTHMATIC BRONCHITIS (ICD-466.0) - no recent bronchitic flairs or problems...  HYPERTENSION (ICD-401.9) - on RAMIPRIL 5mg Bid + IMDUR 60/d (but not taking at present due to HA)... BP= 150/90 & not checking BP's at home... she notes HA; but denies visual changes, CP, palipit, syncope, dyspnea, edema, etc...  ATHEROSCLEROTIC HEART DISEASE (ICD-414.00) & PERIPHERAL VASCULAR DISEASE (ICD-443.9) - regularly on ASA 325mg /d, & recent PLAVIX 75mg  due to PTCA... [facility] (we do not have his notes to review med changes)...  ~  hosp in Jan08 w/ syncope and MRI Brain showed mild atrophy & sm vessel dis;  MRA showed basilar art narrowing & mod stenosis in RICA cavernous segment... CDopplers however were WNL w/ antegrade vertebral flow, and no signif ICA stenoses detected...  ~  hosp Feb10 by [facility] w/ non-STEMI- s/p PTCA and stent in the LAD.  ~  hosp  7/11 by [facility] w/ CP, AbnMyoview & cath w/ 70% midLAD in-stent resteosis; s/p PTCA & started back on Plavix x 41mo.  ATRIAL FLUTTER, PAROXYSMAL (ICD-427.32) - this occured 2/10 following her cath, PTCA, stent... he diagnosed flutter w/ 2:1 block... currently taking AMIODARONE 200mg - 1/2 tab daily...  HYPERCHOLESTEROLEMIA (ICD-272.0) - on CRESTOR 20mg /d... weight is 179#.[name].  ~  FLP 11/08 showed TChol 161, TG 39, HDL 62, LDL 92... continue same med.  ~  FLP 5/09 showed TChol 209, TG 70, HDL 74, LDL 124... rec- better diet, get weight down!  ~  FLP in hosp 2/10 showed TChol  130, TG 69, HDL 45, LDL 71  ~  she reports that [facility] does fasting labs...  ~  FLP 7/11 in hosp showed TChol 105, TG 30, HDL 66, LDL 33  HIATAL HERNIA (ICD-553.3) - prev on Nexium & Carafate Liq- but changed to PEPCID 20mg Bid by [facility] due to need for Plavix... she has a giant HH measuring 10cm per DrSam, w/ erosions in the pouch... last EGD by DrSam 4/08... hernia is seen on her CXRs.  DIVERTICULOSIS OF COLON (ICD-562.10) & COLONIC POLYPS (ICD-211.3)  ~  colonoscopy 11/06 showed divertics and 2- 4mm polyps... f/u planned in 4-32yrs...  ~  [facility] 11/10 w/ lower GIb believed due to divertics... colonoscopy by DrJacobs showed severe divertics, no bleeding site, 7mm polyp removed...  DEGENERATIVE JOINT DISEASE (ICD-715.90) & HIP PAIN, RIGHT (ICD-719.45) - currently taking OTC meds only but DrDean rec VICODIN for as needed use... Eval by DrDean 3/09 w/ bone-on-bone right hip, tried injection & pain meds... right THR done 12/09...  ANXIETY (ICD-300.00) - prev on Chlorazepate but currently not taking anything for nerves...  ANEMIA (ICD-285.9) - prev on Niferex 150mg /d... 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 Rx w/ iron tabs...   ~  last blood count 11/08 showed Hg=13.7, Fe=56...  ~  labs 5/09 showed Hg= 14.2.[name].  ~  labs 2/10 in hosp showed Hg= 13  ~  [facility] 11/10 w/ lower GIB, & Hg down to 8.6- tc 1u PC up to 9.3.Marland KitchenMarland Kitchen  ~  labs 12/10 showed Hg= 10.6.[name]. rec> continue oral Fe w/ VitC...  ~  [facility] labs 7/11 showed Hg= 11.6 - 10.5, MCV= 70... rec> restart Fe.  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   Preventive Screening-Counseling & Management  Alcohol-Tobacco     Smoking Status: never  Allergies (verified): No Known Drug Allergies  Comments:  Nurse/Medical Assistant: The patient's medications and allergies were reviewed with the patient and were updated in the Medication and Allergy Lists.  Past  History:  Past Medical History: ASTHMATIC BRONCHITIS, ACUTE (ICD-466.0) HYPERTENSION (ICD-401.9) ATHEROSCLEROTIC HEART DISEASE (ICD-414.00) ATRIAL FLUTTER, PAROXYSMAL (ICD-427.32) PERIPHERAL VASCULAR DISEASE (ICD-443.9) HYPERCHOLESTEROLEMIA (ICD-272.0) HIATAL HERNIA (ICD-553.3) DIVERTICULOSIS OF COLON (ICD-562.10) COLONIC POLYPS (ICD-211.3) Tubular Adenoma DEGENERATIVE JOINT DISEASE (ICD-715.90) HIP PAIN, RIGHT (ICD-719.45) VITAMIN D DEFICIENCY (ICD-268.9) ANXIETY (ICD-300.00) ANEMIA (ICD-285.9)  Past Surgical History: S/P right THR 12/09 by DrDean S/P PTCA & stent to LAD 2/10 by DrHarwani S/P PTCA to LAD in-stent restenosis 7/11 by [facility]  Family History: Reviewed history from 11/30/2008 and no changes required. heart disease - mother, MGM, MGF rheumatism - mother, father cancer - father (lung) stroke -  mat aunt  Social History: Reviewed history from 11/30/2008 and no changes required. Never Smoked no alcohol no caffeine lives with granddaughter 3 children, 1 has passed widow  Review of  Systems      See HPI       The patient complains of dyspnea on exertion.  The patient denies anorexia, fever, weight loss, weight gain, vision loss, decreased hearing, hoarseness, chest pain, syncope, peripheral edema, prolonged cough, headaches, hemoptysis, abdominal pain, melena, hematochezia, severe indigestion/heartburn, hematuria, incontinence, muscle weakness, suspicious skin lesions, transient blindness, difficulty walking, depression, unusual weight change, abnormal bleeding, enlarged lymph nodes, and angioedema.    Vital Signs:  Patient profile:   77 year old female Height:      62 inches Weight:      178.50 pounds O2 Sat:      98 % on Room air Temp:     97.4 degrees F oral Pulse rate:   71 / minute BP sitting:   150 / 90  (right arm) Cuff size:   regular  Vitals Entered By: Randell Loop CMA (October 19, 2009 11:29 AM)  O2 Sat at Rest %:  98 O2 Flow:  Room  air CC: 8 month ROV & hosp follow up visit... Is Patient Diabetic? No Pain Assessment Patient in pain? no      Comments pt brought all meds today---pts meds updated today   Physical Exam  Additional Exam:  WD, WN, 77y/o BF in NAD... GENERAL:  Alert & oriented; pleasant & cooperative... HEENT:  [place]/AT, EOM-wnl, EACs-clear, TMs-wnl, NOSE-clear, THROAT-clear & wnl. NECK:  Supple w/ fairROM; no JVD; normal carotid impulses w/o bruits; no thyromegaly or nodules palpated; no lymphadenopathy. CHEST:  Coarse BS w/ no wheezing, no rales, no signs of consolidation. HEART:  Regular Rhythm; gr 1/6 SEM without rubs or gallops detected... ABDOMEN:  Soft & nontender; normal bowel sounds; no organomegaly or masses palpated... EXT: without deformities, mod arthritic changes; s/p right THR; no varicose veins/ +venous insuffic/  tr edema . BACK:  sl tender along right PSIS area... NEURO:  CN's intact; no focal neuro defict... DERM:  no lesions seen...    MISC. Report  Procedure date:  10/19/2009  Findings:      DATA REVIEWED:   ~  [name] the H&P, DCSummary, XRays & Cath report, Labs, etc> all from the 7/11 Hospitalization by [facility].   Impression & Recommendations:  Problem # 1:  ATHEROSCLEROTIC HEART DISEASE (ICD-414.00) S/p PTCA by [facility]... he adjusted her meds but she is not tolerating the Isosorbide> she is asked to check back w/ him since this is an important med for her heart & BP which is sl elev off this med... The following medications were removed from the medication list:    Toprol Xl 25 Mg Xr24h-tab (Metoprolol succinate) .[name]... Take 1/2 tab by mouth once daily... Her updated medication list for this problem includes:    Aspir-low 81 Mg Tbec (Aspirin) ..... Once daily    Isosorbide Mononitrate Cr 60 Mg Xr24h-tab (Isosorbide mononitrate) .[name]... Take 1 tab by mouth once daily    Ramipril 5 Mg Caps (Ramipril) .[name]... Take 1 cap by mouth two times a day...    Amiodarone Hcl  200 Mg Tabs (Amiodarone hcl) .[name]... Take 1/2 tab by mouth once daily...    Nitrostat 0.4 Mg Subl (Nitroglycerin) ..... Use as directed  Problem # 2:  HYPERTENSION (ICD-401.9) As above>  she is checking back w/ her Cardiologist today regarding her IMDUR Rx and poss need for substitute medication... The following medications were removed from the medication list:    Toprol Xl 25 Mg Xr24h-tab (Metoprolol succinate) .[name]... Take 1/2 tab by mouth once daily... Her updated  medication list for this problem includes:    Ramipril 5 Mg Caps (Ramipril) .[name]... Take 1 cap by mouth two times a day...  Problem # 3:  ATRIAL FLUTTER, PAROXYSMAL (ICD-427.32) She remain on AMio per Cards... holding NSR w/o arrhythmias apparent... The following medications were removed from the medication list:    Toprol Xl 25 Mg Xr24h-tab (Metoprolol succinate) .[name]... Take 1/2 tab by mouth once daily... Her updated medication list for this problem includes:    Aspir-low 81 Mg Tbec (Aspirin) ..... Once daily    Amiodarone Hcl 200 Mg Tabs (Amiodarone hcl) .[name]... Take 1/2 tab by mouth once daily...  Problem # 4:  HYPERCHOLESTEROLEMIA (ICD-272.0) Her recent FLP in hosp looked fantastic on the [address]... Her updated medication list for this problem includes:    Crestor 20 Mg Tabs (Rosuvastatin calcium) .[name]... Take 1 tab by mouth once daily...  Problem # 5:  HIATAL HERNIA (ICD-553.3) KNOWN huge HH- seen on CXRs... she remains on PEPCID due to Plavix, but could take PPI when Plavix DC'd... Her updated medication list for this problem includes:    Pepcid 20 Mg Tabs (Famotidine) .[name]... Take 1 tab by mouth two times a day...  Problem # 6:  DIVERTICULOSIS OF COLON (ICD-562.10) No recurrent prob or divertic bleeding...  Problem # 7:  ANEMIA (ICD-285.9)  She should resume FeSO$ & Vit C therapy for her microcytic anemia... we will f/u labs on ret.  Her updated medication list for this problem includes:    Ferrous Sulfate 325 (65 Fe) Mg  Tabs (Ferrous sulfate) .[name]... Take 1 tab by mouth once daily (take w/ a 500mg  vit c tab)...  Problem # 8:  OTHER MEDICAL PROBLEMS AS NOTED>>>  Complete Medication List: 1)  Aspir-low 81 Mg Tbec (Aspirin) .... Once daily 2)  Isosorbide Mononitrate Cr 60 Mg Xr24h-tab (Isosorbide mononitrate) .... Take 1 tab by mouth once daily 3)  Ramipril 5 Mg Caps (Ramipril) .... Take 1 cap by mouth two times a day... 4)  Amiodarone Hcl 200 Mg Tabs (Amiodarone hcl) .... Take 1/2 tab by mouth once daily.Marland KitchenMarland Kitchen 5)  Crestor 20 Mg Tabs (Rosuvastatin calcium) .... Take 1 tab by mouth once daily.Marland KitchenMarland Kitchen 6)  Pepcid 20 Mg Tabs (Famotidine) .... Take 1 tab by mouth two times a day... 7)  Oxycodone-acetaminophen 5-325 Mg Tabs (Oxycodone-acetaminophen) .... Take one tablet by mouth two times a day 8)  Nitrostat 0.4 Mg Subl (Nitroglycerin) .... Use as directed 9)  Ferrous Sulfate 325 (65 Fe) Mg Tabs (Ferrous sulfate) .... Take 1 tab by mouth once daily (take w/ a 500mg  vit c tab)...  Patient Instructions: 1)  Today we updated your med list- see below.... 2)  [name] your recent hospital data, XRays & blood work... 3)  Continue your current meds from [facility] & find out what he wants to do w/ you ISOSORBIDE medication.Marland KitchenMarland Kitchen 4)  Stay as active as possible... 5)  Please schedule a follow-up appointment in 6 months.    Appended Document: rov/mbw called and spoke with pt---she is aware that lab results from hosp showed she was anemic---she will start with the iron tablet once daily along with vit c 500mg  daily.  she will call for any further problems

## 2010-04-05 NOTE — Progress Notes (Signed)
Summary: nos appt  Phone Note Call from Patient   Caller: [email or web address]  Call For: Loveah Like Summary of Call: Rsc 5/11 nos to 5/16 @ 9:30a. Initial call taken by: Darletta Moll,  Jul 13, 2009 10:41 AM

## 2010-04-05 NOTE — Assessment & Plan Note (Signed)
Summary: Acute NP office visit - asmatic bronchitis   CC:  prod cough with clear mucus, wheezing, increased SOB, and temp up to 102 x5days - was given zpak and MMW 3 days ago.  History of Present Illness: 77 year old female with known history of HTN, DJD   March 09, 2010 --Presents for an acute office visit. Complains prod cough with clear mucus, wheezing, increased SOB, temp up to 102 x5days - was given zpak and MMW 3 days ago.Family has been sick, was in close contact w/ grandchild w/ URI/PNA. Of note she is on Amiodarone and Ramipril. She was given zpack last week without much help. Still has cough and no energy. Denies chest pain, dyspnea, orthopnea, hemoptysis, n/v/d, edema, headache,recent travel. Temps up and down, the highest few days ago was 102. Tolerating food/liquids w/ no vomitting.   Medications Prior to Update: 1)  Aspir-Low 81 Mg Tbec (Aspirin) .... Once Daily 2)  Isosorbide Mononitrate Cr 60 Mg Xr24h-Tab (Isosorbide Mononitrate) .... Take 1 Tab By Mouth Once Daily 3)  Nitrostat 0.4 Mg Subl (Nitroglycerin) .... Use As Directed 4)  Ramipril 5 Mg Caps (Ramipril) .... Take 1 Cap By Mouth Two Times A Day... 5)  Amiodarone Hcl 200 Mg Tabs (Amiodarone Hcl) .... Take 1/2 Tab By Mouth Once Daily.Marland KitchenMarland Kitchen 6)  Crestor 20 Mg Tabs (Rosuvastatin Calcium) .... Take 1 Tab By Mouth Once Daily.Marland KitchenMarland Kitchen 7)  Pepcid 20 Mg Tabs (Famotidine) .... Take 1 Tab By Mouth Two Times A Day... 8)  Oxycodone-Acetaminophen 5-325 Mg Tabs (Oxycodone-Acetaminophen) .... Take One Tablet By Mouth Two Times A Day 9)  Ferrous Sulfate 325 (65 Fe) Mg Tabs (Ferrous Sulfate) .... Take 1 Tab By Mouth Once Daily (Take W/ A 500mg  Vit C Tab)... 10)  Tylenol 325 Mg Tabs (Acetaminophen) .... As Needed For Pain or Fever 11)  Robitussin Maximum Strength 15 Mg/58ml Syrp (Dextromethorphan Hbr) .... As Needed For Cough 12)  Augmentin 875-125 Mg Tabs (Amoxicillin-Pot Clavulanate) .... Take 1 Tab By Mouth Two Times A Day Til Gone... 13)   Prednisone (Pak) 5 Mg Tabs (Prednisone) .... Take As Directed Starting 11/25/09 Am... 14)  Tramadol Hcl 50 Mg Tabs (Tramadol Hcl) .... Take 1 Tab Every 6 H As Needed For Pain... 15)  Mmw .... 1 Tsp Swish and Swallow Four Times A Day 16)  Zithromax Z-Pak 250 Mg Tabs (Azithromycin) .... Take As Directed  Current Medications (verified): 1)  Aspir-Low 81 Mg Tbec (Aspirin) .... Once Daily 2)  Isosorbide Mononitrate Cr 60 Mg Xr24h-Tab (Isosorbide Mononitrate) .... Take 1 Tab By Mouth Once Daily 3)  Nitrostat 0.4 Mg Subl (Nitroglycerin) .... Use As Directed 4)  Ramipril 5 Mg Caps (Ramipril) .... Take 2 Capsules By Mouth Every Evening and 1 By Mouth At Bedtime 5)  Amiodarone Hcl 200 Mg Tabs (Amiodarone Hcl) .... Take 1/2 Tab By Mouth Once Daily.Marland KitchenMarland Kitchen 6)  Crestor 20 Mg Tabs (Rosuvastatin Calcium) .... Take 1 Tab By Mouth Once Daily.Marland KitchenMarland Kitchen 7)  Pepcid 20 Mg Tabs (Famotidine) .... Take 1 Tab By Mouth Two Times A Day... 8)  Oxycodone-Acetaminophen 5-325 Mg Tabs (Oxycodone-Acetaminophen) .... Take One Tablet By Mouth Two Times A Day 9)  Ferrous Sulfate 325 (65 Fe) Mg Tabs (Ferrous Sulfate) .... Take 1 Tab By Mouth Once Daily (Take W/ A 500mg  Vit C Tab)... 10)  Tylenol 325 Mg Tabs (Acetaminophen) .... As Needed For Pain or Fever 11)  Robitussin Maximum Strength 15 Mg/58ml Syrp (Dextromethorphan Hbr) .... As Needed For Cough 12)  Tramadol Hcl  50 Mg Tabs (Tramadol Hcl) .... Take 1 Tab Every 6 H As Needed For Pain... 13)  Mmw .... 1 Tsp Swish and Swallow Four Times A Day 14)  Zithromax Z-Pak 250 Mg Tabs (Azithromycin) .... Take As Directed  Allergies (verified): No Known Drug Allergies  Past History:  Past Medical History: Last updated: 11/24/2009 ASTHMATIC BRONCHITIS, ACUTE (ICD-466.0) HYPERTENSION (ICD-401.9) ATHEROSCLEROTIC HEART DISEASE (ICD-414.00) ATRIAL FLUTTER, PAROXYSMAL (ICD-427.32) PERIPHERAL VASCULAR DISEASE (ICD-443.9) HYPERCHOLESTEROLEMIA (ICD-272.0) HIATAL HERNIA (ICD-553.3) DIVERTICULOSIS OF  COLON (ICD-562.10) COLONIC POLYPS (ICD-211.3) Tubular Adenoma DEGENERATIVE JOINT DISEASE (ICD-715.90) HIP PAIN, RIGHT (ICD-719.45) VITAMIN D DEFICIENCY (ICD-268.9) ANXIETY (ICD-300.00) ANEMIA (ICD-285.9)  Past Surgical History: Last updated: 11/24/2009 S/P right THR 12/09 by DrDean S/P PTCA & stent to LAD 2/10 by DrHarwani S/P PTCA to LAD in-stent restenosis 7/11 by DrHarwani  Family History: Last updated: 10/19/2009 heart disease - mother, MGM, MGF rheumatism - mother, father cancer - father (lung) stroke -  mat aunt  Social History: Last updated: 10/19/2009 Never Smoked no alcohol no caffeine lives with granddaughter 3 children, 1 has passed widow  Risk Factors: Smoking Status: never (11/24/2009)  Review of Systems      See HPI  Vital Signs:  Patient profile:   77 year old female Height:      62 inches Weight:      176.50 pounds BMI:     32.40 O2 Sat:      99 % on Room air Temp:     98.2 degrees F oral Pulse rate:   74 / minute BP sitting:   118 / 82  (left arm) Cuff size:   large  Vitals Entered By: Boone Master CNA/MA (March 09, 2010 10:57 AM)  O2 Flow:  Room air CC: prod cough with clear mucus, wheezing, increased SOB, temp up to 102 x5days - was given zpak and MMW 3 days ago Is Patient Diabetic? No Comments Medications reviewed with patient Daytime contact number verified with patient. Boone Master CNA/MA  March 09, 2010 11:00 AM    Physical Exam  Additional Exam:  WD, WN, 77y/o BF in NAD... GENERAL:  Alert & oriented; pleasant & cooperative... HEENT:  [place]/AT, EOM-wnl,, EACs-clear, TMs-wnl, NOSE-clear, THROAT-clear & wnl. NECK:  Supple w/ fairROM; no JVD; normal carotid impulses w/o bruits; no thyromegaly or nodules palpated; no lymphadenopathy. CHEST:  Coarse BS w/ no wheezing HEART:  Regular Rhythm; gr 1/6 SEM without rubs or gallops detected... ABDOMEN:  Soft & nontender; normal bowel sounds; no organomegaly or masses palpated... EXT:  without deformities, mod arthritic changes; s/p right THR; no varicose veins/ +venous insuffic/  tr edema . NEURO:  CN's intact; no focal neuro defict... DERM:  no lesions seen...     Impression & Recommendations:  Problem # 1:  ACUTE BRONCHITIS (ICD-466.0)  slow to resolve w/ limited improvement with zpack  xray pending.  xopenex neb given in office  Plan:  Avelox 400mg  once daily for 7 days Mucinex DM two times a day as needed cough/congestion  Hydromet 1/2-1 tsp every 6 hr as needed cough/congestion  Increase fluids and rest.  Tylenol as needed fever  Please contact office for sooner follow up if symptoms do not improve or worsen  follow up Dr. Kriste Basque 1 month as planned   The following medications were removed from the medication list:    Augmentin 875-125 Mg Tabs (Amoxicillin-pot clavulanate) .[name]... Take 1 tab by mouth two times a day til gone... Her updated medication list for this problem includes:  Robitussin Maximum Strength 15 Mg/68ml Syrp (Dextromethorphan hbr) .[name]... As needed for cough    Zithromax Z-pak 250 Mg Tabs (Azithromycin) .[name]... Take as directed    Avelox 400 Mg Tabs (Moxifloxacin hcl) .[name]... 1 by mouth once daily    Hydromet 5-1.5 Mg/70ml Syrp (Hydrocodone-homatropine) .[name]... 1/2-1 tsp every 6hr as needed cough, may make you sleepy  Orders: T-2 View CXR (71020TC) Nebulizer Tx (81017) Est. Patient Level IV (51025)  Medications Added to Medication List This Visit: 1)  Ramipril 5 Mg Caps (Ramipril) .... Take 2 capsules by mouth every evening and 1 by mouth at bedtime 2)  Avelox 400 Mg Tabs (Moxifloxacin hcl) .[name].. 1 by mouth once daily 3)  Hydromet 5-1.5 Mg/49ml Syrp (Hydrocodone-homatropine) .... 1/2-1 tsp every 6hr as needed cough, may make you sleepy  Patient Instructions: 1)  Avelox 400mg  once daily for 7 days 2)  Mucinex DM two times a day as needed cough/congestion  3)  Hydromet 1/2-1 tsp every 6 hr as needed cough/congestion  4)  Increase fluids and rest.   5)  Tylenol as needed fever  6)  Please contact office for sooner follow up if symptoms do not improve or worsen  7)  follow up Dr. Kriste Basque 1 month as planned  Prescriptions: HYDROMET 5-1.5 MG/5ML SYRP (HYDROCODONE-HOMATROPINE) 1/2-1 tsp every 6hr as needed cough, may make you sleepy  #8 oz x 0   Entered and Authorized by:   Rubye Oaks NP   Signed by:   Juleen Sorrels NP on 03/09/2010   Method used:   Print then Give to Patient   RxID:   [id number] AVELOX 400 MG TABS (MOXIFLOXACIN HCL) 1 by mouth once daily  #5 x 0   Entered and Authorized by:   Rubye Oaks NP   Signed by:   Leonda Cristo NP on 03/09/2010   Method used:   Electronically to        [facility] (retail)       [address]       [place], [place]  [id number]       Ph: [phone]       Fax: [phone]   RxID:   [id number]      Medication Administration  Medication # 1:    Medication: Xopenex 1.25mg     Diagnosis: ACUTE BRONCHITIS (ICD-466.0)    Dose: 1 vial    Route: inhaled    Exp Date: 08-12    Lot #: [id number]    Mfr: sepracor    Patient tolerated medication without complications    Given by: Zackery Barefoot CMA (March 09, 2010 11:25 AM)  Orders Added: 1)  T-2 View CXR [71020TC] 2)  Nebulizer Tx [73532] 3)  Est. Patient Level IV [99242]

## 2010-04-05 NOTE — Miscellaneous (Signed)
Summary: [facility]  [facility]   Imported By: Sherian Rein 03/15/2010 12:41:16  _____________________________________________________________________  External Attachment:    Type:   Image     Comment:   External Document

## 2010-04-05 NOTE — Progress Notes (Signed)
Summary: blood pressure  Phone Note From Other Clinic   Caller: Byrd Hesselbach with [facility] Call For: dr. Kriste Basque Summary of Call: Byrd Hesselbach with [facility] called she is faxing over Ms. Breach blood pressures. Her cardiac doctor Dr. Sharyn Lull is out of the country. If you have any questions Byrd Hesselbach can be reached at [phone]  Initial call taken by: Vedia Coffer,  March 07, 2010 4:24 PM  Follow-up for Phone Call        pt seen today in office - BP 118/82.  per pt BPs fine at home.  per TP: BP ok in office and at home.  no change in meds.  continue to monitor. Boone Master CNA/MA  March 09, 2010 11:12 AM   Byrd Hesselbach at [facility] notified . Abigail Miyamoto RN  March 09, 2010 11:24 AM

## 2010-04-05 NOTE — Progress Notes (Signed)
Summary: nos appt  Phone Note Call from Patient   Caller: [email or web address]  Call For: Virginia Casey Summary of Call: Rsc appt from 5/11 nos to 8/18 @ 11:30a. Initial call taken by: Darletta Moll,  Jul 13, 2009 10:28 AM

## 2010-04-05 NOTE — Progress Notes (Signed)
Summary: sore throat / wheezing  Phone Note Call from Patient   Caller: Patient Call For: nadel Summary of Call: sore throat and wheezing with cough [facility] Initial call taken by: Rickard Patience,  June 14, 2009 10:38 AM  Follow-up for Phone Call        pt request antibiotic and refill for cough syrup tp gave back in september--per sn ok to give z-pak and same cough syrup tp gave back in 11/2008 which was phenergan vc w/cod--pt aware both meds phoned to pharmacy Follow-up by: Philipp Deputy CMA,  June 14, 2009 2:57 PM    New/Updated Medications: PROMETHAZINE VC/CODEINE 6.25-5-10 MG/5ML SYRP (PHENYLEPH-PROMETHAZINE-COD) 1 tsp every 4-6 hrs as needed for cough ZITHROMAX Z-PAK 250 MG TABS (AZITHROMYCIN) take as directed Prescriptions: ZITHROMAX Z-PAK 250 MG TABS (AZITHROMYCIN) take as directed  #1 x 0   Entered by:   Philipp Deputy CMA   Authorized by:   Michele Mcalpine MD   Signed by:   Philipp Deputy CMA on 06/14/2009   Method used:   Telephoned to ...       [facility] [address] [address] (retail)       [address]       [place], [place]  [id number]       Ph: [phone]       Fax: [phone]   RxID:   [id number] PROMETHAZINE VC/CODEINE 6.25-5-10 MG/5ML SYRP (PHENYLEPH-PROMETHAZINE-COD) 1 tsp every 4-6 hrs as needed for cough  #8 x 0   Entered by:   Philipp Deputy CMA   Authorized by:   Michele Mcalpine MD   Signed by:   Philipp Deputy CMA on 06/14/2009   Method used:   Telephoned to ...       [facility] [address] (retail)       [address]       [place], [place]  [id number]       Ph: [phone]       Fax: [phone]   RxID:   [id number]

## 2010-04-05 NOTE — Progress Notes (Signed)
Summary: nos appt  Phone Note Call from Patient   Caller: [email or web address]  Call For: Kariss Longmire Summary of Call: Rsc nos from 5/11 to 6/6 @ 9:30a. Initial call taken by: Darletta Moll,  Jul 13, 2009 10:46 AM

## 2010-04-05 NOTE — Progress Notes (Signed)
Summary: Cold, cough, earache, headache  Phone Note Call from Patient Call back at [phone]   Caller: Patient Summary of Call: Patient has cold and earache, coughing and throat sore, headache.  Went to urgent care yesterday but could not get medication "because she did not have strep throat." Initial call taken by: Leonette Monarch,  March 06, 2010 12:34 PM  Follow-up for Phone Call        Spoke with pt and pt c/o sore throat, ear pain, headache, productive cough with clear phlegm x 3 days. Pt states she went to [facility] urgent care yesterday and they did a cxr and told her no PNA, and tested her for strep throat which was negative.  Pt staes because of this they would not give her any medication, but she knows that Dr. Kriste Basque will not wnat her to stay sick. Please advise. Carron Curie CMA  March 06, 2010 3:16 PM allergies: NKDA  Additional Follow-up for Phone Call Additional follow up Details #1::        per sn". Recs zpak #1 take as directed, mmw #4 oz. take four times a day. call if not better and we will see her. Thanks Carver Fila  March 06, 2010 3:27 PM     Additional Follow-up for Phone Call Additional follow up Details #2::    Spoke with pt and notified of the above recs per SN.  Rxs were sent to pharm. Follow-up by: Vernie Murders,  March 06, 2010 3:42 PM  New/Updated Medications: * MMW 1 tsp swish and swallow four times a day ZITHROMAX Z-PAK 250 MG TABS (AZITHROMYCIN) take as directed Prescriptions: ZITHROMAX Z-PAK 250 MG TABS (AZITHROMYCIN) take as directed  #1 x 0   Entered by:   Vernie Murders   Authorized by:   Michele Mcalpine MD   Signed by:   Vernie Murders on 03/06/2010   Method used:   Telephoned to ...       [facility] (retail)       [address]       [place], [place]  [id number]       Ph: [phone]       Fax: [phone]   RxID:   [id number] MMW 1 tsp swish and swallow four times a day  #4 oz x 0   Entered by:    Vernie Murders   Authorized by:   Michele Mcalpine MD   Signed by:   Vernie Murders on 03/06/2010   Method used:   Telephoned to ...       [facility] (retail)       [address]       [place], [place]  [id number]       Ph: [phone]       Fax: [phone]   RxID:   [id number]

## 2010-04-05 NOTE — Progress Notes (Signed)
Summary: congested/ sob-APPT SCHED  Phone Note Call from Patient Call back at [phone]   Caller: Patient Call For: nadel Summary of Call: pt coughing/ sob and congested would like antibiotic [facility] [address] Initial call taken by: Lehman Prom,  November 23, 2009 11:47 AM Caller: [facility] Call For: nadel  Summary of Call: pt congested sob with chest pains. [facility] [address] Initial call taken by: Lehman Prom,  November 23, 2009 11:45 AM  Follow-up for Phone Call        called and spoke with pt.  pt states symptoms started on Sunday 11/19/2009.  Pt c/o coughing up yellow sputum, "chest hurts on R and L side and coughing makes it worse," sore throat, wheezing, nasal congestion with clear nasal drainage.  Pt states she had a fever last night of 103.0 but took asa and denies a fever today.  Offered pt an appt today but pt declined stating she had no transportation.  Please advise.  Thank you. Arman Filter LPN  November 23, 2009 12:00 PM   NKDA  Additional Follow-up for Phone Call Additional follow up Details #1::        per SN---ov tomorrow at 12 with SN or we can call in augmentin 875mg   #14  1 by mouth two times a day if not allergic to PCN and get mucinex max otc 1 by mouth two times a day with plenty of fluids---if she wants a pain pill we can call in tramadol 50mg   #30  1 by mouth every 6 hours as needed for pain.  thanks Randell Loop CMA  November 23, 2009 12:27 PM     Additional Follow-up for Phone Call Additional follow up Details #2::    Spoke with pt and notified of recs per SN.  Pt states that she prefers to come in for appt at noon tommorrow so I sched this.  Advised that in the meantime if worsens call or go to ER. Follow-up by: Vernie Murders,  November 23, 2009 1:35 PM

## 2010-04-06 ENCOUNTER — Encounter (HOSPITAL_COMMUNITY): Payer: Medicare Other | Attending: Cardiology

## 2010-04-06 DIAGNOSIS — I209 Angina pectoris, unspecified: Secondary | ICD-10-CM | POA: Insufficient documentation

## 2010-04-06 DIAGNOSIS — I4891 Unspecified atrial fibrillation: Secondary | ICD-10-CM | POA: Insufficient documentation

## 2010-04-06 DIAGNOSIS — Z7982 Long term (current) use of aspirin: Secondary | ICD-10-CM | POA: Insufficient documentation

## 2010-04-06 DIAGNOSIS — I252 Old myocardial infarction: Secondary | ICD-10-CM | POA: Insufficient documentation

## 2010-04-06 DIAGNOSIS — E7800 Pure hypercholesterolemia, unspecified: Secondary | ICD-10-CM | POA: Insufficient documentation

## 2010-04-06 DIAGNOSIS — M1990 Unspecified osteoarthritis, unspecified site: Secondary | ICD-10-CM | POA: Insufficient documentation

## 2010-04-06 DIAGNOSIS — K219 Gastro-esophageal reflux disease without esophagitis: Secondary | ICD-10-CM | POA: Insufficient documentation

## 2010-04-06 DIAGNOSIS — Z79899 Other long term (current) drug therapy: Secondary | ICD-10-CM | POA: Insufficient documentation

## 2010-04-06 DIAGNOSIS — I2510 Atherosclerotic heart disease of native coronary artery without angina pectoris: Secondary | ICD-10-CM | POA: Insufficient documentation

## 2010-04-06 DIAGNOSIS — Z5189 Encounter for other specified aftercare: Secondary | ICD-10-CM | POA: Insufficient documentation

## 2010-04-06 DIAGNOSIS — I10 Essential (primary) hypertension: Secondary | ICD-10-CM | POA: Insufficient documentation

## 2010-04-06 DIAGNOSIS — Z7902 Long term (current) use of antithrombotics/antiplatelets: Secondary | ICD-10-CM | POA: Insufficient documentation

## 2010-04-06 DIAGNOSIS — Z9861 Coronary angioplasty status: Secondary | ICD-10-CM | POA: Insufficient documentation

## 2010-04-09 ENCOUNTER — Encounter (HOSPITAL_COMMUNITY): Payer: Medicare Other

## 2010-04-11 ENCOUNTER — Encounter (HOSPITAL_COMMUNITY): Payer: Medicare Other

## 2010-04-13 ENCOUNTER — Encounter (HOSPITAL_COMMUNITY): Payer: Medicare Other

## 2010-04-16 ENCOUNTER — Encounter (HOSPITAL_COMMUNITY): Payer: Medicare Other

## 2010-04-18 ENCOUNTER — Ambulatory Visit (INDEPENDENT_AMBULATORY_CARE_PROVIDER_SITE_OTHER): Payer: MEDICARE | Admitting: Pulmonary Disease

## 2010-04-18 ENCOUNTER — Encounter: Payer: Self-pay | Admitting: Pulmonary Disease

## 2010-04-18 ENCOUNTER — Encounter (HOSPITAL_COMMUNITY): Payer: Medicare Other

## 2010-04-18 DIAGNOSIS — I739 Peripheral vascular disease, unspecified: Secondary | ICD-10-CM

## 2010-04-18 DIAGNOSIS — K5730 Diverticulosis of large intestine without perforation or abscess without bleeding: Secondary | ICD-10-CM

## 2010-04-18 DIAGNOSIS — D649 Anemia, unspecified: Secondary | ICD-10-CM

## 2010-04-18 DIAGNOSIS — F411 Generalized anxiety disorder: Secondary | ICD-10-CM

## 2010-04-18 DIAGNOSIS — I4892 Unspecified atrial flutter: Secondary | ICD-10-CM

## 2010-04-18 DIAGNOSIS — K449 Diaphragmatic hernia without obstruction or gangrene: Secondary | ICD-10-CM

## 2010-04-18 DIAGNOSIS — E559 Vitamin D deficiency, unspecified: Secondary | ICD-10-CM

## 2010-04-18 DIAGNOSIS — D126 Benign neoplasm of colon, unspecified: Secondary | ICD-10-CM

## 2010-04-18 DIAGNOSIS — E7800 Pure hypercholesterolemia, unspecified: Secondary | ICD-10-CM

## 2010-04-18 DIAGNOSIS — J209 Acute bronchitis, unspecified: Secondary | ICD-10-CM

## 2010-04-18 DIAGNOSIS — I2510 Atherosclerotic heart disease of native coronary artery without angina pectoris: Secondary | ICD-10-CM

## 2010-04-18 DIAGNOSIS — M1990 Unspecified osteoarthritis, unspecified site: Secondary | ICD-10-CM

## 2010-04-18 DIAGNOSIS — I10 Essential (primary) hypertension: Secondary | ICD-10-CM

## 2010-04-18 DIAGNOSIS — E079 Disorder of thyroid, unspecified: Secondary | ICD-10-CM | POA: Insufficient documentation

## 2010-04-20 ENCOUNTER — Encounter (INDEPENDENT_AMBULATORY_CARE_PROVIDER_SITE_OTHER): Payer: Self-pay | Admitting: *Deleted

## 2010-04-20 ENCOUNTER — Encounter: Payer: Self-pay | Admitting: Pulmonary Disease

## 2010-04-20 ENCOUNTER — Other Ambulatory Visit: Payer: MEDICARE

## 2010-04-20 ENCOUNTER — Other Ambulatory Visit: Payer: Self-pay | Admitting: Pulmonary Disease

## 2010-04-20 ENCOUNTER — Encounter (HOSPITAL_COMMUNITY): Payer: Medicare Other

## 2010-04-20 DIAGNOSIS — I10 Essential (primary) hypertension: Secondary | ICD-10-CM

## 2010-04-20 DIAGNOSIS — E7800 Pure hypercholesterolemia, unspecified: Secondary | ICD-10-CM

## 2010-04-20 DIAGNOSIS — D649 Anemia, unspecified: Secondary | ICD-10-CM

## 2010-04-20 DIAGNOSIS — E039 Hypothyroidism, unspecified: Secondary | ICD-10-CM

## 2010-04-20 DIAGNOSIS — R748 Abnormal levels of other serum enzymes: Secondary | ICD-10-CM

## 2010-04-20 LAB — TSH: TSH: 0.03 u[IU]/mL — ABNORMAL LOW (ref 0.35–5.50)

## 2010-04-20 LAB — HEPATIC FUNCTION PANEL
ALT: 22 U/L (ref 0–35)
AST: 26 U/L (ref 0–37)
Albumin: 3.7 g/dL (ref 3.5–5.2)
Alkaline Phosphatase: 86 U/L (ref 39–117)
Total Protein: 6.9 g/dL (ref 6.0–8.3)

## 2010-04-20 LAB — CBC WITH DIFFERENTIAL/PLATELET
Basophils Absolute: 0 10*3/uL (ref 0.0–0.1)
Eosinophils Absolute: 0.1 10*3/uL (ref 0.0–0.7)
Hemoglobin: 12.4 g/dL (ref 12.0–15.0)
Lymphocytes Relative: 33.9 % (ref 12.0–46.0)
MCHC: 32.6 g/dL (ref 30.0–36.0)
Monocytes Relative: 9.6 % (ref 3.0–12.0)
Neutrophils Relative %: 55.1 % (ref 43.0–77.0)
Platelets: 160 10*3/uL (ref 150.0–400.0)
RDW: 16.1 % — ABNORMAL HIGH (ref 11.5–14.6)

## 2010-04-20 LAB — BASIC METABOLIC PANEL
CO2: 29 mEq/L (ref 19–32)
Calcium: 9.5 mg/dL (ref 8.4–10.5)
Chloride: 107 mEq/L (ref 96–112)
Glucose, Bld: 82 mg/dL (ref 70–99)
Sodium: 142 mEq/L (ref 135–145)

## 2010-04-20 LAB — LIPID PANEL: HDL: 50.4 mg/dL (ref >39.00)

## 2010-04-20 LAB — IBC PANEL
Iron: 56 ug/dL (ref 42–145)
Saturation Ratios: 10.4 % — ABNORMAL LOW (ref 20.0–50.0)
Transferrin: 385.7 mg/dL — ABNORMAL HIGH (ref 212.0–360.0)

## 2010-04-23 ENCOUNTER — Encounter (HOSPITAL_COMMUNITY): Payer: Medicare Other

## 2010-04-25 ENCOUNTER — Encounter (HOSPITAL_COMMUNITY): Payer: Medicare Other

## 2010-04-27 ENCOUNTER — Encounter (HOSPITAL_COMMUNITY): Payer: Medicare Other

## 2010-04-30 ENCOUNTER — Encounter (HOSPITAL_COMMUNITY): Payer: Medicare Other

## 2010-05-01 NOTE — Assessment & Plan Note (Signed)
Summary: 6 month return/mhh   CC:  5 month ROV & review of mult medical problems....  History of Present Illness: 77 y/o BF here for a follow up visit... she has mult med problems as noted below...    ~  October 19, 2009:  she was hosp 7/29 - 10/04/09 by Maia Breslow for CP w/ Myoview showing ant ischemia, norm wall motion & EF= 82%;  cath showed 70% midLAD in-stent restenosis, otherw non-obstructive dis seen in other vessels;  subseq PTCA was successful & she was placed on PLAVIX x26mo (plus her ASA)...  her DC meds were changed to ASA/ PLAVIX, RAMIPRIL, IMDUR, AMIO, CRESTOR, PERCOCET...  we reviewed hosp records> CXR, Labs, etc (see below)...  Since hosp she tells me she stopped the Isosorbide on her own due to HA & "makes me sick";  since then her BP is sl higher  ~150/90, & she has a call into DrHarwani to see what to do...   ~  November 24, 2009:  add-on visit for cough x several days> states onset URI w/ cough, yellow sputum, low grade fever, congestion, and bilat rib pain from the cough... no hemoptysis, ch in dyspnea, angina CP, etc...  taking Tylenol, fluids, Robitussin... we discussed checking CXR & Rx w/ Depo80, Pred Dosepak, Augmentin, Mucinex, & Tramadol...   ~  April 18, 2010:    She reports a good 6 months- no new complaints or concerns;  Maia Breslow has her in Cardiac Rehab & she really like it- walking, biking, etc & no CP/ palpit/ SOB/ edema/ etc...    BP controlled & as prev noted DrHarwani makes freq changes in her meds & we don't have notes from his office (she reports off Imdur, off Plavix, on Amio 200mg - 1/2 daily);  Chol looks fabulous on Cres20 & she may indeed be able to decr to 10mg  dose (copy labs to [facility]);  GI stable on Pepcid, & as noted she is off the Plavix now);  her Hg is stable on the Fe & VitC;  only surprize finding is low TSH at 0.03 & we will contact pt to ret for hyperthy labs & further eval...   Current Problem List:    ASTHMATIC BRONCHITIS (ICD-466.0) -  breathing is stable w/o recent resp exac...  ~  CXR 1/12 showed basilar atx & large HH, NAD...  HYPERTENSION (ICD-401.9) - on RAMIPRIL 5mg -2Bid per [facility] & off Imdur due to HA... BP= 140/76 & not checking BP's at home... she denies visual changes, CP, palipit, syncope, dyspnea, edema, etc...  ATHEROSCLEROTIC HEART DISEASE (ICD-414.00) & PERIPHERAL VASCULAR DISEASE (ICD-443.9) - regularly on ASA 81mg /d, & off Plavix now... followed by [facility] for Cards (we do not have his notes to review med changes)>>  ~  hosp in Jan08 w/ syncope and MRI Brain showed mild atrophy & sm vessel dis;  MRA showed basilar art narrowing & mod stenosis in RICA cavernous segment... CDopplers however were WNL w/ antegrade vertebral flow, and no signif ICA stenoses detected...  ~  hosp Feb10 [facility] w/ non-STEMI- s/p PTCA and stent in the LAD.  ~  hosp 7/11 by [facility] w/ CP, Abn Myoview & cath w/ 70% midLAD in-stent resteosis; s/p PTCA & started back on Plavix x 9mo.  ~  she is in Cardiac Rehab & really likes it...  ATRIAL FLUTTER, PAROXYSMAL (ICD-427.32) - this occured 2/10 following her cath, PTCA, stent... he diagnosed flutter w/ 2:1 block... currently taking AMIODARONE 200mg - 1/2 tab daily...  HYPERCHOLESTEROLEMIA (ICD-272.0) - on  CRESTOR 20mg /d... weight is 179#.[name].  ~  FLP 11/08 showed TChol 161, TG 39, HDL 62, LDL 92... continue same med.  ~  FLP 5/09 showed TChol 209, TG 70, HDL 74, LDL 124... rec- better diet, get weight down!  ~  FLP in hosp 2/10 showed TChol 130, TG 69, HDL 45, LDL 71  ~  she reports that [facility] does fasting labs...  ~  FLP 7/11 in hosp showed TChol 105, TG 30, HDL 66, LDL 33  ~  FLP 2/12 on Cres20 showed TChol 97, TG 38, HDL 50, LDL 39... copy to Cards ?decr to 10mg ?  HIATAL HERNIA (ICD-553.3) - prev on Nexium & Carafate Liq- but changed to PEPCID 20mg Bid by [facility] due to need for Plavix... she has a giant HH measuring 10cm per DrSam, w/ erosions in the pouch... last EGD by  DrSam 4/08... hernia is seen on her CXRs.  DIVERTICULOSIS OF COLON (ICD-562.10) & COLONIC POLYPS (ICD-211.3)  ~  colonoscopy 11/06 showed divertics and 2- 4mm polyps... f/u planned in 4-62yrs...  ~  [facility] 11/10 w/ lower GIB believed due to divertics... colonoscopy by DrJacobs showed severe divertics, no bleeding site, 7mm polyp removed...  DEGENERATIVE JOINT DISEASE (ICD-715.90) & HIP PAIN, RIGHT (ICD-719.45) - currently taking OTC meds only but DrDean rec Vicodin for as needed use (she prefers Tylenol)... Eval by DrDean 3/09 w/ bone-on-bone right hip, tried injection & pain meds... right THR done 12/09...  VITAMIN D DEFICIENCY (ICD-268.9)  ~  labs 5/09 showed Vit D level = 9... pt started on 50K weekly but she switched on her own to 1000u/d OTC.  ~  labs 2/12 showed Vit D level = 18... asked to incr Vit D supplement to 2000 u daily.  ANXIETY (ICD-300.00) - prev on Chlorazepate but currently not taking anything for nerves...  ANEMIA (ICD-285.9) - prev on Niferex 150mg /d, now FeSO4 325mg /d w/ Vit C 500mg ... 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  ~  last blood count 11/08 showed Hg=13.7, Fe=56...  ~  labs 5/09 showed Hg= 14.2.[name].  ~  labs 2/10 in hosp showed Hg= 13  ~  [facility] 11/10 w/ lower GIB, & Hg down to 8.6- tc 1u PC up to 9.3.Marland KitchenMarland Kitchen  ~  labs 12/10 showed Hg= 10.6.[name]. rec> continue oral Fe w/ VitC...  ~  [facility] labs 7/11 showed Hg= 11.6 - 10.5, MCV= 70... rec> restart Fe.  ~  labs 2/12 showed Hg= 12.4, MCV= 77, Fe= 56 (sat=10%)... continue Fe w/ VitC.  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   Preventive Screening-Counseling & Management  Alcohol-Tobacco     Smoking Status: never  Allergies (verified): No Known Drug Allergies  Comments:  Nurse/Medical Assistant: The patient's medications and allergies were reviewed with the patient and were updated in the Medication and Allergy Lists.  Past History:  Past  Medical History: ASTHMATIC BRONCHITIS, ACUTE (ICD-466.0) HYPERTENSION (ICD-401.9) ATHEROSCLEROTIC HEART DISEASE (ICD-414.00) ATRIAL FLUTTER, PAROXYSMAL (ICD-427.32) PERIPHERAL VASCULAR DISEASE (ICD-443.9) HYPERCHOLESTEROLEMIA (ICD-272.0) HIATAL HERNIA (ICD-553.3) DIVERTICULOSIS OF COLON (ICD-562.10) COLONIC POLYPS (ICD-211.3) Tubular Adenoma DEGENERATIVE JOINT DISEASE (ICD-715.90) HIP PAIN, RIGHT (ICD-719.45) VITAMIN D DEFICIENCY (ICD-268.9) ANXIETY (ICD-300.00) ANEMIA (ICD-285.9)  Past Surgical History: S/P right THR 12/09 by DrDean S/P PTCA & stent to LAD 2/10 by DrHarwani S/P PTCA to LAD in-stent restenosis 7/11 by [facility]  Family History: Reviewed history from 10/19/2009 and no changes required. heart disease - mother, MGM, MGF rheumatism -  mother, father cancer - father (lung) stroke -  mat aunt  Social History: Reviewed history from 10/19/2009 and no changes required. Never Smoked no alcohol no caffeine lives with granddaughter 3 children, 1 has passed widow  Review of Systems      See HPI       The patient complains of dyspnea on exertion.  The patient denies anorexia, fever, weight loss, weight gain, vision loss, decreased hearing, hoarseness, chest pain, syncope, peripheral edema, prolonged cough, headaches, hemoptysis, abdominal pain, melena, hematochezia, severe indigestion/heartburn, hematuria, incontinence, muscle weakness, suspicious skin lesions, transient blindness, difficulty walking, depression, unusual weight change, abnormal bleeding, enlarged lymph nodes, and angioedema.    Vital Signs:  Patient profile:   77 year old female Height:      62 inches Weight:      176.13 pounds BMI:     32.33 O2 Sat:      100 % on Room air Temp:     97.9 degrees F oral Pulse rate:   70 / minute BP sitting:   140 / 76  (right arm) Cuff size:   regular  Vitals Entered By: Randell Loop CMA (April 18, 2010 11:05 AM)  O2 Sat at Rest %:  100 O2 Flow:  Room  air CC: 5 month ROV & review of mult medical problems... Is Patient Diabetic? No Pain Assessment Patient in pain? no      Comments meds updated today with pt---pt brought in all meds today   Physical Exam  Additional Exam:  WD, WN, 77 y/o BF in NAD... GENERAL:  Alert & oriented; pleasant & cooperative... HEENT:  [place]/AT, EOM-wnl, EACs-clear, TMs-wnl, NOSE-clear, THROAT-clear & wnl. NECK:  Supple w/ fairROM; no JVD; normal carotid impulses w/o bruits; no thyromegaly or nodules palpated; no lymphadenopathy. CHEST:  Coarse BS w/ no wheezing, no rales, no signs of consolidation. HEART:  Regular Rhythm; gr 1/6 SEM without rubs or gallops detected... ABDOMEN:  Soft & nontender; normal bowel sounds; no organomegaly or masses palpated... EXT: without deformities, mod arthritic changes; s/p right THR; no varicose veins/ +venous insuffic/  tr edema . BACK:  sl tender along right PSIS area... NEURO:  CN's intact; no focal neuro defict... DERM:  no lesions seen...    Impression & Recommendations:  Problem # 1:  ACUTE BRONCHITIS (ICD-466.0) Breathing is stable w/o recent resp exac... The following medications were removed from the medication list:    Robitussin Maximum Strength 15 Mg/82ml Syrp (Dextromethorphan hbr) .[name]... As needed for cough    Zithromax Z-pak 250 Mg Tabs (Azithromycin) .[name]... Take as directed    Avelox 400 Mg Tabs (Moxifloxacin hcl) .[name]... 1 by mouth once daily    Hydromet 5-1.5 Mg/64ml Syrp (Hydrocodone-homatropine) .[name]... 1/2-1 tsp every 6hr as needed cough, may make you sleepy  Problem # 2:  HYPERTENSION (ICD-401.9) BP controlled on meds>  Cards per [employer] w/ freq med changes... Her updated medication list for this problem includes:    Ramipril 5 Mg Caps (Ramipril) .[name]... Take 2 capsules by mouth every morning and 2  by mouth at bedtime  Problem # 3:  ATHEROSCLEROTIC HEART DISEASE (ICD-414.00) Followed by Maia Breslow for Cards>  we don't have his notes to review... The  following medications were removed from the medication list:    Isosorbide Mononitrate Cr 60 Mg Xr24h-tab (Isosorbide mononitrate) .[name]... Take 1 tab by mouth once daily Her updated medication list for this problem includes:    Aspir-low 81 Mg Tbec (Aspirin) ..... Once daily  Nitrostat 0.4 Mg Subl (Nitroglycerin) ..... Use as directed    Ramipril 5 Mg Caps (Ramipril) .[name]... Take 2 capsules by mouth every morning and 2  by mouth at bedtime    Amiodarone Hcl 200 Mg Tabs (Amiodarone hcl) .[name]... Take 1/2 tab by mouth once daily...  Problem # 4:  ATRIAL FLUTTER, PAROXYSMAL (ICD-427.32) On Amio per [facility] & pt maintains regular f/u w/ him... Her updated medication list for this problem includes:    Aspir-low 81 Mg Tbec (Aspirin) ..... Once daily    Amiodarone Hcl 200 Mg Tabs (Amiodarone hcl) .[name]... Take 1/2 tab by mouth once daily...  Problem # 5:  HYPERCHOLESTEROLEMIA (ICD-272.0) FLP w/ low TChol & LDL... copy to Cards- may be able to decr to 10mg /d at drHarwani's discretion... Her updated medication list for this problem includes:    Crestor 20 Mg Tabs (Rosuvastatin calcium) .[name]... Take 1 tab by mouth once daily...  Problem # 6:  HIATAL HERNIA (ICD-553.3) GI is stable on Pepcid... no pain, bleeding, etc... Her updated medication list for this problem includes:    Pepcid 20 Mg Tabs (Famotidine) .[name]... Take 1 tab by mouth two times a day...  Problem # 7:  DEGENERATIVE JOINT DISEASE (ICD-715.90) DJD followed by DrDean>  stable on OTC Tylenol Prn... The following medications were removed from the medication list:    Oxycodone-acetaminophen 5-325 Mg Tabs (Oxycodone-acetaminophen) .[name]... Take one tablet by mouth two times a day    Tramadol Hcl 50 Mg Tabs (Tramadol hcl) .[name]... Take 1 tab every 6 h as needed for pain... Her updated medication list for this problem includes:    Aspir-low 81 Mg Tbec (Aspirin) ..... Once daily    Tylenol 325 Mg Tabs (Acetaminophen) .[name]... As needed for pain or  fever  Problem # 8:  VITAMIN D DEFICIENCY (ICD-268.9) Vit D level is still only 18... rec to incr Vit D to 2000 u daily...  Problem # 9:  ANEMIA (ICD-285.9) Stable on the FeSO4... Her updated medication list for this problem includes:    Ferrous Sulfate 325 (65 Fe) Mg Tabs (Ferrous sulfate) .[name]... Take 1 tab by mouth once daily (take w/ a 500mg  vit c tab)...  Problem # 10:  OTHER MEDICAL PROBLEMS AS NOTED>>> LABS REVEAL LOW TSH= 0.03... not on thyroid meds... pt to ret for hyperthy w/u 7 check FreeT3, FreeT4 > pending.  Complete Medication List: 1)  Aspir-low 81 Mg Tbec (Aspirin) .... Once daily 2)  Nitrostat 0.4 Mg Subl (Nitroglycerin) .... Use as directed 3)  Ramipril 5 Mg Caps (Ramipril) .... Take 2 capsules by mouth every morning and 2  by mouth at bedtime 4)  Amiodarone Hcl 200 Mg Tabs (Amiodarone hcl) .... Take 1/2 tab by mouth once daily.Marland KitchenMarland Kitchen 5)  Crestor 20 Mg Tabs (Rosuvastatin calcium) .... Take 1 tab by mouth once daily.Marland KitchenMarland Kitchen 6)  Pepcid 20 Mg Tabs (Famotidine) .... Take 1 tab by mouth two times a day... 7)  Tylenol 325 Mg Tabs (Acetaminophen) .... As needed for pain or fever 8)  Ferrous Sulfate 325 (65 Fe) Mg Tabs (Ferrous sulfate) .... Take 1 tab by mouth once daily (take w/ a 500mg  vit c tab).Marland KitchenMarland Kitchen 9)  Vitamin C 500 Mg Tabs (Ascorbic acid) .... Take 1 tab by mouth once daily w/ the iron pill 10)  Vitamin D3 1000 Unit Tabs (Cholecalciferol) .... Take 1 cap by mouth once daily  Patient Instructions: 1)  Today we updated your med list- see below.... 2)  Continue your current meds the same.Marland KitchenMarland Kitchen  3)  Please return to our lab on Fri 04/20/10 AM for your FASTING blood work... .then please call the "phone tree" in a few days for your lab results.Marland KitchenMarland Kitchen 4)  Call for any problems.Marland KitchenMarland Kitchen 5)  Please schedule a follow-up appointment in 6 months.

## 2010-05-02 ENCOUNTER — Encounter (INDEPENDENT_AMBULATORY_CARE_PROVIDER_SITE_OTHER): Payer: Self-pay | Admitting: *Deleted

## 2010-05-02 ENCOUNTER — Encounter (HOSPITAL_COMMUNITY): Payer: Medicare Other

## 2010-05-02 ENCOUNTER — Other Ambulatory Visit: Payer: MEDICARE

## 2010-05-02 ENCOUNTER — Other Ambulatory Visit: Payer: Self-pay | Admitting: Pulmonary Disease

## 2010-05-02 DIAGNOSIS — E0591 Thyrotoxicosis, unspecified with thyrotoxic crisis or storm: Secondary | ICD-10-CM

## 2010-05-04 ENCOUNTER — Encounter (HOSPITAL_COMMUNITY): Payer: Medicare Other | Attending: Cardiology

## 2010-05-04 DIAGNOSIS — M1990 Unspecified osteoarthritis, unspecified site: Secondary | ICD-10-CM | POA: Insufficient documentation

## 2010-05-04 DIAGNOSIS — Z7902 Long term (current) use of antithrombotics/antiplatelets: Secondary | ICD-10-CM | POA: Insufficient documentation

## 2010-05-04 DIAGNOSIS — Z9861 Coronary angioplasty status: Secondary | ICD-10-CM | POA: Insufficient documentation

## 2010-05-04 DIAGNOSIS — I252 Old myocardial infarction: Secondary | ICD-10-CM | POA: Insufficient documentation

## 2010-05-04 DIAGNOSIS — I4891 Unspecified atrial fibrillation: Secondary | ICD-10-CM | POA: Insufficient documentation

## 2010-05-04 DIAGNOSIS — I2510 Atherosclerotic heart disease of native coronary artery without angina pectoris: Secondary | ICD-10-CM | POA: Insufficient documentation

## 2010-05-04 DIAGNOSIS — Z7982 Long term (current) use of aspirin: Secondary | ICD-10-CM | POA: Insufficient documentation

## 2010-05-04 DIAGNOSIS — K219 Gastro-esophageal reflux disease without esophagitis: Secondary | ICD-10-CM | POA: Insufficient documentation

## 2010-05-04 DIAGNOSIS — I209 Angina pectoris, unspecified: Secondary | ICD-10-CM | POA: Insufficient documentation

## 2010-05-04 DIAGNOSIS — Z79899 Other long term (current) drug therapy: Secondary | ICD-10-CM | POA: Insufficient documentation

## 2010-05-04 DIAGNOSIS — E7800 Pure hypercholesterolemia, unspecified: Secondary | ICD-10-CM | POA: Insufficient documentation

## 2010-05-04 DIAGNOSIS — Z5189 Encounter for other specified aftercare: Secondary | ICD-10-CM | POA: Insufficient documentation

## 2010-05-04 DIAGNOSIS — I10 Essential (primary) hypertension: Secondary | ICD-10-CM | POA: Insufficient documentation

## 2010-05-07 ENCOUNTER — Encounter (HOSPITAL_COMMUNITY): Payer: Medicare Other

## 2010-05-09 ENCOUNTER — Telehealth (INDEPENDENT_AMBULATORY_CARE_PROVIDER_SITE_OTHER): Payer: Self-pay | Admitting: *Deleted

## 2010-05-09 ENCOUNTER — Encounter (HOSPITAL_COMMUNITY): Payer: Medicare Other

## 2010-05-10 ENCOUNTER — Telehealth (INDEPENDENT_AMBULATORY_CARE_PROVIDER_SITE_OTHER): Payer: Self-pay | Admitting: *Deleted

## 2010-05-11 ENCOUNTER — Encounter (HOSPITAL_COMMUNITY): Payer: Medicare Other

## 2010-05-14 ENCOUNTER — Encounter (HOSPITAL_COMMUNITY): Payer: Medicare Other

## 2010-05-14 LAB — POCT URINALYSIS DIPSTICK
Bilirubin Urine: NEGATIVE
Glucose, UA: NEGATIVE mg/dL
Ketones, ur: NEGATIVE mg/dL
Nitrite: NEGATIVE
pH: 5.5 (ref 5.0–8.0)

## 2010-05-15 NOTE — Progress Notes (Signed)
Summary: needs last office note and thyroid faxed for tomorrows appt  Phone Note From Other Clinic   Caller: [facility] WITH [facility]ERR Summary of Call: We referred Ms Ribaudo to Dr Sharl Ma and they need copies of her last office note and the last Thyroid results faxed to his office [phone]. Sherrion can be reached at [phone] ext [phone] Initial call taken by: Vedia Coffer,  May 10, 2010 2:13 PM  Follow-up for Phone Call        Faxed records.//Juantia Follow-up by: Darletta Moll,  May 10, 2010 2:33 PM

## 2010-05-15 NOTE — Progress Notes (Signed)
Summary: needs labs faxed for appt tomorrow  Phone Note From Other Clinic   Caller: [facility] WITH [facility] Call For: NADEL Summary of Call: Sherrion with [facility] office phoned stated that Dr Kriste Basque referred this patient to him and they need copies of her lab work faxed to [employer] at [phone] Initial call taken by: Vedia Coffer,  May 09, 2010 10:48 AM  Follow-up for Phone Call        Faxed labs.//Juanita Follow-up by: Darletta Moll,  May 10, 2010 8:27 AM

## 2010-05-16 ENCOUNTER — Encounter (HOSPITAL_COMMUNITY): Payer: Medicare Other

## 2010-05-18 ENCOUNTER — Encounter (HOSPITAL_COMMUNITY): Payer: Medicare Other

## 2010-05-18 LAB — BASIC METABOLIC PANEL
BUN: 13 mg/dL (ref 6–23)
Chloride: 109 mEq/L (ref 96–112)
Creatinine, Ser: 1.02 mg/dL (ref 0.4–1.2)
GFR calc Af Amer: >60 mL/min (ref >60)
GFR calc non Af Amer: 52 mL/min — ABNORMAL LOW (ref >60)
Potassium: 4.2 mEq/L (ref 3.5–5.1)

## 2010-05-18 LAB — CARDIAC PANEL(CRET KIN+CKTOT+MB+TROPI)
Total CK: 131 U/L (ref 7–177)
Troponin I: 0.03 ng/mL (ref 0.00–0.06)

## 2010-05-18 LAB — CBC
MCHC: 33.3 g/dL (ref 30.0–36.0)
RDW: 18.5 % — ABNORMAL HIGH (ref 11.5–15.5)

## 2010-05-18 LAB — PROTIME-INR: INR: 1.09 (ref 0.00–1.49)

## 2010-05-18 LAB — APTT: aPTT: 28 seconds (ref 24–37)

## 2010-05-19 LAB — CK TOTAL AND CKMB (NOT AT ARMC)
CK, MB: 2.9 ng/mL (ref 0.3–4.0)
Relative Index: 1.6 (ref 0.0–2.5)
Total CK: 176 U/L (ref 7–177)

## 2010-05-19 LAB — POCT CARDIAC MARKERS
CKMB, poc: 2.5 ng/mL (ref 1.0–8.0)
Myoglobin, poc: 86 ng/mL (ref 12–200)
Troponin i, poc: <0.05 ng/mL (ref 0.00–0.09)

## 2010-05-19 LAB — URINALYSIS, ROUTINE W REFLEX MICROSCOPIC
Bilirubin Urine: NEGATIVE
Glucose, UA: NEGATIVE mg/dL
Ketones, ur: NEGATIVE mg/dL
Nitrite: NEGATIVE
Protein, ur: 100 mg/dL — AB
Specific Gravity, Urine: 1.019 (ref 1.005–1.030)
Urobilinogen, UA: 0.2 mg/dL (ref 0.0–1.0)
pH: 5 (ref 5.0–8.0)

## 2010-05-19 LAB — COMPREHENSIVE METABOLIC PANEL WITH GFR
ALT: 20 U/L (ref 0–35)
AST: 29 U/L (ref 0–37)
Albumin: 3.5 g/dL (ref 3.5–5.2)
Alkaline Phosphatase: 96 U/L (ref 39–117)
BUN: 17 mg/dL (ref 6–23)
CO2: 25 meq/L (ref 19–32)
Calcium: 8.4 mg/dL (ref 8.4–10.5)
Chloride: 106 meq/L (ref 96–112)
Creatinine, Ser: 1.1 mg/dL (ref 0.4–1.2)
GFR calc Af Amer: 58 mL/min — ABNORMAL LOW (ref >60)
GFR calc non Af Amer: 48 mL/min — ABNORMAL LOW (ref >60)
Glucose, Bld: 157 mg/dL — ABNORMAL HIGH (ref 70–99)
Potassium: 4 meq/L (ref 3.5–5.1)
Sodium: 137 meq/L (ref 135–145)
Total Bilirubin: 0.7 mg/dL (ref 0.3–1.2)
Total Protein: 6.9 g/dL (ref 6.0–8.3)

## 2010-05-19 LAB — PROTIME-INR
INR: 1.07 (ref 0.00–1.49)
Prothrombin Time: 13.8 seconds (ref 11.6–15.2)
Prothrombin Time: 14 seconds (ref 11.6–15.2)

## 2010-05-19 LAB — CBC
HCT: 33.7 % — ABNORMAL LOW (ref 36.0–46.0)
HCT: 34.4 % — ABNORMAL LOW (ref 36.0–46.0)
HCT: 35.1 % — ABNORMAL LOW (ref 36.0–46.0)
HCT: 36.1 % (ref 36.0–46.0)
Hemoglobin: 10.8 g/dL — ABNORMAL LOW (ref 12.0–15.0)
Hemoglobin: 11 g/dL — ABNORMAL LOW (ref 12.0–15.0)
Hemoglobin: 11.1 g/dL — ABNORMAL LOW (ref 12.0–15.0)
Hemoglobin: 11.6 g/dL — ABNORMAL LOW (ref 12.0–15.0)
MCH: 22.5 pg — ABNORMAL LOW (ref 26.0–34.0)
MCH: 22.7 pg — ABNORMAL LOW (ref 26.0–34.0)
MCH: 22.8 pg — ABNORMAL LOW (ref 26.0–34.0)
MCH: 23.1 pg — ABNORMAL LOW (ref 26.0–34.0)
MCHC: 31.5 g/dL (ref 30.0–36.0)
MCHC: 32 g/dL (ref 30.0–36.0)
MCHC: 32.1 g/dL (ref 30.0–36.0)
MCHC: 32.1 g/dL (ref 30.0–36.0)
MCV: 70.8 fL — ABNORMAL LOW (ref 78.0–100.0)
MCV: 71.1 fL — ABNORMAL LOW (ref 78.0–100.0)
MCV: 71.5 fL — ABNORMAL LOW (ref 78.0–100.0)
MCV: 71.9 fL — ABNORMAL LOW (ref 78.0–100.0)
Platelets: 126 K/uL — ABNORMAL LOW (ref 150–400)
Platelets: 141 K/uL — ABNORMAL LOW (ref 150–400)
Platelets: 142 K/uL — ABNORMAL LOW (ref 150–400)
RBC: 4.76 MIL/uL (ref 3.87–5.11)
RBC: 4.78 MIL/uL (ref 3.87–5.11)
RBC: 4.9 MIL/uL (ref 3.87–5.11)
RBC: 5.08 MIL/uL (ref 3.87–5.11)
RDW: 19.7 % — ABNORMAL HIGH (ref 11.5–15.5)
RDW: 19.7 % — ABNORMAL HIGH (ref 11.5–15.5)
RDW: 20 % — ABNORMAL HIGH (ref 11.5–15.5)
WBC: 6.2 K/uL (ref 4.0–10.5)
WBC: 6.3 K/uL (ref 4.0–10.5)
WBC: 6.5 K/uL (ref 4.0–10.5)

## 2010-05-19 LAB — BASIC METABOLIC PANEL WITH GFR
BUN: 23 mg/dL (ref 6–23)
CO2: 27 meq/L (ref 19–32)
Calcium: 8.6 mg/dL (ref 8.4–10.5)
Chloride: 106 meq/L (ref 96–112)
Creatinine, Ser: 1.08 mg/dL (ref 0.4–1.2)
GFR calc Af Amer: 59 mL/min — ABNORMAL LOW (ref >60)
GFR calc non Af Amer: 49 mL/min — ABNORMAL LOW (ref >60)
Glucose, Bld: 100 mg/dL — ABNORMAL HIGH (ref 70–99)
Potassium: 4.2 meq/L (ref 3.5–5.1)
Sodium: 139 meq/L (ref 135–145)

## 2010-05-19 LAB — CARDIAC PANEL(CRET KIN+CKTOT+MB+TROPI)
Relative Index: 1.5 (ref 0.0–2.5)
Troponin I: 0.02 ng/mL (ref 0.00–0.06)

## 2010-05-19 LAB — LIPID PANEL
Cholesterol: 105 mg/dL (ref 0–200)
HDL: 66 mg/dL (ref >39)
LDL Cholesterol: 33 mg/dL (ref 0–99)
Total CHOL/HDL Ratio: 1.6 ratio
Triglycerides: 30 mg/dL (ref <150)
VLDL: 6 mg/dL (ref 0–40)

## 2010-05-19 LAB — APTT
aPTT: 27 s (ref 24–37)
aPTT: 42 seconds — ABNORMAL HIGH (ref 24–37)

## 2010-05-19 LAB — DIFFERENTIAL
Basophils Absolute: 0 K/uL (ref 0.0–0.1)
Basophils Relative: 0 % (ref 0–1)
Eosinophils Absolute: 0.1 K/uL (ref 0.0–0.7)
Eosinophils Relative: 2 % (ref 0–5)
Lymphocytes Relative: 40 % (ref 12–46)
Lymphs Abs: 2.5 K/uL (ref 0.7–4.0)
Monocytes Absolute: 0.6 K/uL (ref 0.1–1.0)
Monocytes Relative: 10 % (ref 3–12)
Neutro Abs: 3.1 K/uL (ref 1.7–7.7)
Neutrophils Relative %: 48 % (ref 43–77)

## 2010-05-19 LAB — URINE MICROSCOPIC-ADD ON

## 2010-05-19 LAB — HEPARIN LEVEL (UNFRACTIONATED)
Heparin Unfractionated: 0.18 [IU]/mL — ABNORMAL LOW (ref 0.30–0.70)
Heparin Unfractionated: 0.52 [IU]/mL (ref 0.30–0.70)

## 2010-05-19 LAB — BASIC METABOLIC PANEL
BUN: 16 mg/dL (ref 6–23)
Chloride: 109 mEq/L (ref 96–112)
Creatinine, Ser: 1.03 mg/dL (ref 0.4–1.2)
Glucose, Bld: 98 mg/dL (ref 70–99)
Potassium: 4.6 mEq/L (ref 3.5–5.1)

## 2010-05-19 LAB — TROPONIN I: Troponin I: 0.02 ng/mL (ref 0.00–0.06)

## 2010-05-21 ENCOUNTER — Encounter (HOSPITAL_COMMUNITY): Payer: Medicare Other

## 2010-05-23 ENCOUNTER — Encounter (HOSPITAL_COMMUNITY): Payer: Medicare Other

## 2010-05-25 ENCOUNTER — Encounter (HOSPITAL_COMMUNITY): Payer: Medicare Other

## 2010-05-28 ENCOUNTER — Encounter (HOSPITAL_COMMUNITY): Payer: Medicare Other

## 2010-05-30 ENCOUNTER — Encounter (HOSPITAL_COMMUNITY): Payer: Medicare Other

## 2010-06-01 ENCOUNTER — Encounter (HOSPITAL_COMMUNITY): Payer: Medicare Other

## 2010-06-04 ENCOUNTER — Encounter (HOSPITAL_COMMUNITY): Payer: Medicare Other | Attending: Cardiology

## 2010-06-04 DIAGNOSIS — Z9861 Coronary angioplasty status: Secondary | ICD-10-CM | POA: Insufficient documentation

## 2010-06-04 DIAGNOSIS — K219 Gastro-esophageal reflux disease without esophagitis: Secondary | ICD-10-CM | POA: Insufficient documentation

## 2010-06-04 DIAGNOSIS — I2510 Atherosclerotic heart disease of native coronary artery without angina pectoris: Secondary | ICD-10-CM | POA: Insufficient documentation

## 2010-06-04 DIAGNOSIS — E7800 Pure hypercholesterolemia, unspecified: Secondary | ICD-10-CM | POA: Insufficient documentation

## 2010-06-04 DIAGNOSIS — Z5189 Encounter for other specified aftercare: Secondary | ICD-10-CM | POA: Insufficient documentation

## 2010-06-04 DIAGNOSIS — Z79899 Other long term (current) drug therapy: Secondary | ICD-10-CM | POA: Insufficient documentation

## 2010-06-04 DIAGNOSIS — I4891 Unspecified atrial fibrillation: Secondary | ICD-10-CM | POA: Insufficient documentation

## 2010-06-04 DIAGNOSIS — Z7902 Long term (current) use of antithrombotics/antiplatelets: Secondary | ICD-10-CM | POA: Insufficient documentation

## 2010-06-04 DIAGNOSIS — I10 Essential (primary) hypertension: Secondary | ICD-10-CM | POA: Insufficient documentation

## 2010-06-04 DIAGNOSIS — Z7982 Long term (current) use of aspirin: Secondary | ICD-10-CM | POA: Insufficient documentation

## 2010-06-04 DIAGNOSIS — I252 Old myocardial infarction: Secondary | ICD-10-CM | POA: Insufficient documentation

## 2010-06-04 DIAGNOSIS — M1990 Unspecified osteoarthritis, unspecified site: Secondary | ICD-10-CM | POA: Insufficient documentation

## 2010-06-04 DIAGNOSIS — I209 Angina pectoris, unspecified: Secondary | ICD-10-CM | POA: Insufficient documentation

## 2010-06-06 ENCOUNTER — Encounter (HOSPITAL_COMMUNITY): Payer: Medicare Other

## 2010-06-06 LAB — BASIC METABOLIC PANEL
CO2: 25 mEq/L (ref 19–32)
CO2: 27 mEq/L (ref 19–32)
Calcium: 8.1 mg/dL — ABNORMAL LOW (ref 8.4–10.5)
Calcium: 8.2 mg/dL — ABNORMAL LOW (ref 8.4–10.5)
Creatinine, Ser: 0.78 mg/dL (ref 0.4–1.2)
GFR calc Af Amer: >60 mL/min (ref >60)
GFR calc Af Amer: >60 mL/min (ref >60)
GFR calc non Af Amer: >60 mL/min (ref >60)
Sodium: 140 mEq/L (ref 135–145)

## 2010-06-06 LAB — CARDIAC PANEL(CRET KIN+CKTOT+MB+TROPI)
CK, MB: 3.3 ng/mL (ref 0.3–4.0)
CK, MB: 3.8 ng/mL (ref 0.3–4.0)
Relative Index: 2.5 (ref 0.0–2.5)
Relative Index: 2.5 (ref 0.0–2.5)
Relative Index: 2.6 — ABNORMAL HIGH (ref 0.0–2.5)
Total CK: 129 U/L (ref 7–177)
Troponin I: 0.02 ng/mL (ref 0.00–0.06)

## 2010-06-06 LAB — CBC
HCT: 28.8 % — ABNORMAL LOW (ref 36.0–46.0)
HCT: 29.1 % — ABNORMAL LOW (ref 36.0–46.0)
HCT: 29.5 % — ABNORMAL LOW (ref 36.0–46.0)
HCT: 30.3 % — ABNORMAL LOW (ref 36.0–46.0)
HCT: 30.5 % — ABNORMAL LOW (ref 36.0–46.0)
HCT: 31.8 % — ABNORMAL LOW (ref 36.0–46.0)
HCT: 33.5 % — ABNORMAL LOW (ref 36.0–46.0)
Hemoglobin: 10.2 g/dL — ABNORMAL LOW (ref 12.0–15.0)
Hemoglobin: 11.1 g/dL — ABNORMAL LOW (ref 12.0–15.0)
Hemoglobin: 8.6 g/dL — ABNORMAL LOW (ref 12.0–15.0)
Hemoglobin: 8.7 g/dL — ABNORMAL LOW (ref 12.0–15.0)
Hemoglobin: 9.3 g/dL — ABNORMAL LOW (ref 12.0–15.0)
Hemoglobin: 9.5 g/dL — ABNORMAL LOW (ref 12.0–15.0)
Hemoglobin: 9.8 g/dL — ABNORMAL LOW (ref 12.0–15.0)
MCHC: 32.4 g/dL (ref 30.0–36.0)
MCHC: 32.9 g/dL (ref 30.0–36.0)
MCHC: 32.9 g/dL (ref 30.0–36.0)
MCHC: 33.2 g/dL (ref 30.0–36.0)
MCHC: 33.3 g/dL (ref 30.0–36.0)
MCHC: 33.3 g/dL (ref 30.0–36.0)
MCHC: 33.4 g/dL (ref 30.0–36.0)
MCHC: 33.4 g/dL (ref 30.0–36.0)
MCHC: 33.4 g/dL (ref 30.0–36.0)
MCV: 77.8 fL — ABNORMAL LOW (ref 78.0–100.0)
MCV: 80 fL (ref 78.0–100.0)
MCV: 80.2 fL (ref 78.0–100.0)
MCV: 80.4 fL (ref 78.0–100.0)
MCV: 80.5 fL (ref 78.0–100.0)
MCV: 80.5 fL (ref 78.0–100.0)
MCV: 80.8 fL (ref 78.0–100.0)
MCV: 81 fL (ref 78.0–100.0)
Platelets: 102 10*3/uL — ABNORMAL LOW (ref 150–400)
Platelets: 108 10*3/uL — ABNORMAL LOW (ref 150–400)
Platelets: 110 K/uL — ABNORMAL LOW (ref 150–400)
Platelets: 80 10*3/uL — ABNORMAL LOW (ref 150–400)
Platelets: 87 10*3/uL — ABNORMAL LOW (ref 150–400)
Platelets: 91 10*3/uL — ABNORMAL LOW (ref 150–400)
Platelets: 98 10*3/uL — ABNORMAL LOW (ref 150–400)
RBC: 3.21 MIL/uL — ABNORMAL LOW (ref 3.87–5.11)
RBC: 3.22 MIL/uL — ABNORMAL LOW (ref 3.87–5.11)
RBC: 3.26 MIL/uL — ABNORMAL LOW (ref 3.87–5.11)
RBC: 3.47 MIL/uL — ABNORMAL LOW (ref 3.87–5.11)
RBC: 3.66 MIL/uL — ABNORMAL LOW (ref 3.87–5.11)
RBC: 3.71 MIL/uL — ABNORMAL LOW (ref 3.87–5.11)
RBC: 3.79 MIL/uL — ABNORMAL LOW (ref 3.87–5.11)
RBC: 3.87 MIL/uL (ref 3.87–5.11)
RBC: 3.93 MIL/uL (ref 3.87–5.11)
RBC: 4.3 MIL/uL (ref 3.87–5.11)
RDW: 16.5 % — ABNORMAL HIGH (ref 11.5–15.5)
RDW: 16.9 % — ABNORMAL HIGH (ref 11.5–15.5)
RDW: 18.8 % — ABNORMAL HIGH (ref 11.5–15.5)
RDW: 18.9 % — ABNORMAL HIGH (ref 11.5–15.5)
RDW: 19.6 % — ABNORMAL HIGH (ref 11.5–15.5)
RDW: 19.9 % — ABNORMAL HIGH (ref 11.5–15.5)
WBC: 7.8 10*3/uL (ref 4.0–10.5)
WBC: 8.6 10*3/uL (ref 4.0–10.5)
WBC: 9 10*3/uL (ref 4.0–10.5)
WBC: 9.1 10*3/uL (ref 4.0–10.5)
WBC: 9.6 K/uL (ref 4.0–10.5)
WBC: 9.8 10*3/uL (ref 4.0–10.5)

## 2010-06-06 LAB — COMPREHENSIVE METABOLIC PANEL
ALT: 15 U/L (ref 0–35)
Alkaline Phosphatase: 65 U/L (ref 39–117)
BUN: 13 mg/dL (ref 6–23)
CO2: 25 mEq/L (ref 19–32)
Chloride: 111 mEq/L (ref 96–112)
GFR calc non Af Amer: >60 mL/min (ref >60)
Glucose, Bld: 83 mg/dL (ref 70–99)
Potassium: 4.2 mEq/L (ref 3.5–5.1)
Sodium: 140 mEq/L (ref 135–145)
Total Bilirubin: 1.1 mg/dL (ref 0.3–1.2)
Total Protein: 6.2 g/dL (ref 6.0–8.3)

## 2010-06-06 LAB — DIFFERENTIAL
Basophils Absolute: 0 10*3/uL (ref 0.0–0.1)
Basophils Relative: 0 % (ref 0–1)
Basophils Relative: 1 % (ref 0–1)
Eosinophils Absolute: 0.3 10*3/uL (ref 0.0–0.7)
Eosinophils Relative: 1 % (ref 0–5)
Eosinophils Relative: 1 % (ref 0–5)
Lymphocytes Relative: 23 % (ref 12–46)
Lymphocytes Relative: 26 % (ref 12–46)
Lymphs Abs: 1.9 10*3/uL (ref 0.7–4.0)
Lymphs Abs: 2.2 10*3/uL (ref 0.7–4.0)
Monocytes Absolute: 0.6 10*3/uL (ref 0.1–1.0)
Monocytes Absolute: 0.6 10*3/uL (ref 0.1–1.0)
Monocytes Relative: 5 % (ref 3–12)
Neutro Abs: 4.1 10*3/uL (ref 1.7–7.7)
Neutrophils Relative %: 64 % (ref 43–77)
Neutrophils Relative %: 71 % (ref 43–77)

## 2010-06-06 LAB — CROSSMATCH
ABO/RH(D): O POS
Antibody Screen: NEGATIVE

## 2010-06-06 LAB — COMPREHENSIVE METABOLIC PANEL WITH GFR
ALT: 20 U/L (ref 0–35)
AST: 27 U/L (ref 0–37)
Albumin: 3.5 g/dL (ref 3.5–5.2)
Alkaline Phosphatase: 77 U/L (ref 39–117)
BUN: 24 mg/dL — ABNORMAL HIGH (ref 6–23)
CO2: 26 meq/L (ref 19–32)
Calcium: 8.8 mg/dL (ref 8.4–10.5)
Chloride: 107 meq/L (ref 96–112)
Creatinine, Ser: 1.24 mg/dL — ABNORMAL HIGH (ref 0.4–1.2)
GFR calc non Af Amer: 42 mL/min — ABNORMAL LOW
Glucose, Bld: 95 mg/dL (ref 70–99)
Potassium: 4.1 meq/L (ref 3.5–5.1)
Sodium: 139 meq/L (ref 135–145)
Total Bilirubin: 0.5 mg/dL (ref 0.3–1.2)
Total Protein: 6.6 g/dL (ref 6.0–8.3)

## 2010-06-06 LAB — PROTIME-INR
INR: 1.05 (ref 0.00–1.49)
Prothrombin Time: 13.6 seconds (ref 11.6–15.2)

## 2010-06-08 ENCOUNTER — Encounter (HOSPITAL_COMMUNITY): Payer: Medicare Other

## 2010-06-11 ENCOUNTER — Encounter (HOSPITAL_COMMUNITY): Payer: Medicare Other

## 2010-06-13 ENCOUNTER — Encounter (HOSPITAL_COMMUNITY): Payer: Medicare Other

## 2010-06-15 ENCOUNTER — Encounter (HOSPITAL_COMMUNITY): Payer: Medicare Other

## 2010-06-18 ENCOUNTER — Encounter (HOSPITAL_COMMUNITY): Payer: Medicare Other

## 2010-06-19 LAB — CBC
HCT: 35.3 % — ABNORMAL LOW (ref 36.0–46.0)
HCT: 33.2 % — ABNORMAL LOW (ref 36.0–46.0)
Hemoglobin: 13.5 g/dL (ref 12.0–15.0)
MCHC: 32.6 g/dL (ref 30.0–36.0)
MCHC: 33 g/dL (ref 30.0–36.0)
MCHC: 33 g/dL (ref 30.0–36.0)
MCHC: 33.2 g/dL (ref 30.0–36.0)
MCV: 75.9 fL — ABNORMAL LOW (ref 78.0–100.0)
MCV: 74.9 fL — ABNORMAL LOW (ref 78.0–100.0)
MCV: 75.5 fL — ABNORMAL LOW (ref 78.0–100.0)
MCV: 75.7 fL — ABNORMAL LOW (ref 78.0–100.0)
Platelets: 115 10*3/uL — ABNORMAL LOW (ref 150–400)
Platelets: 139 10*3/uL — ABNORMAL LOW (ref 150–400)
Platelets: 154 10*3/uL (ref 150–400)
Platelets: 165 10*3/uL (ref 150–400)
RBC: 4.65 MIL/uL (ref 3.87–5.11)
RBC: 5.43 MIL/uL — ABNORMAL HIGH (ref 3.87–5.11)
RBC: 4.37 MIL/uL (ref 3.87–5.11)
RDW: 16.7 % — ABNORMAL HIGH (ref 11.5–15.5)
RDW: 16.9 % — ABNORMAL HIGH (ref 11.5–15.5)
WBC: 6.7 10*3/uL (ref 4.0–10.5)

## 2010-06-19 LAB — DIFFERENTIAL
Basophils Absolute: 0.1 10*3/uL (ref 0.0–0.1)
Basophils Relative: 1 % (ref 0–1)
Lymphocytes Relative: 37 % (ref 12–46)
Monocytes Relative: 6 % (ref 3–12)
Neutro Abs: 6.1 10*3/uL (ref 1.7–7.7)

## 2010-06-19 LAB — CARDIAC PANEL(CRET KIN+CKTOT+MB+TROPI)
CK, MB: 2.5 ng/mL (ref 0.3–4.0)
CK, MB: 6 ng/mL — ABNORMAL HIGH (ref 0.3–4.0)
Total CK: 481 U/L — ABNORMAL HIGH (ref 7–177)
Total CK: 131 U/L (ref 7–177)
Total CK: 166 U/L (ref 7–177)
Total CK: 349 U/L — ABNORMAL HIGH (ref 7–177)
Troponin I: 6.18 ng/mL (ref 0.00–0.06)
Troponin I: 1.62 ng/mL (ref 0.00–0.06)
Troponin I: 2.45 ng/mL (ref 0.00–0.06)
Troponin I: 4.85 ng/mL (ref 0.00–0.06)

## 2010-06-19 LAB — POCT I-STAT, CHEM 8
Chloride: 105 mEq/L (ref 96–112)
Creatinine, Ser: 0.9 mg/dL (ref 0.4–1.2)
Glucose, Bld: 91 mg/dL (ref 70–99)
HCT: 39 % (ref 36.0–46.0)
Potassium: 4 mEq/L (ref 3.5–5.1)

## 2010-06-19 LAB — POCT CARDIAC MARKERS
CKMB, poc: 25.4 ng/mL (ref 1.0–8.0)
Myoglobin, poc: 122 ng/mL (ref 12–200)
Myoglobin, poc: 39.9 ng/mL (ref 12–200)

## 2010-06-19 LAB — CK TOTAL AND CKMB (NOT AT ARMC)
CK, MB: 24.8 ng/mL — ABNORMAL HIGH (ref 0.3–4.0)
CK, MB: 2.1 ng/mL (ref 0.3–4.0)
CK, MB: 2.2 ng/mL (ref 0.3–4.0)
Relative Index: 8.9 — ABNORMAL HIGH (ref 0.0–2.5)
Relative Index: 1.7 (ref 0.0–2.5)
Total CK: 127 U/L (ref 7–177)
Total CK: 129 U/L (ref 7–177)

## 2010-06-19 LAB — BASIC METABOLIC PANEL
BUN: 12 mg/dL (ref 6–23)
BUN: 16 mg/dL (ref 6–23)
CO2: 26 mEq/L (ref 19–32)
CO2: 27 mEq/L (ref 19–32)
Calcium: 9.7 mg/dL (ref 8.4–10.5)
Calcium: 8.5 mg/dL (ref 8.4–10.5)
Calcium: 8.7 mg/dL (ref 8.4–10.5)
Calcium: 8.8 mg/dL (ref 8.4–10.5)
Calcium: 9.5 mg/dL (ref 8.4–10.5)
Chloride: 102 mEq/L (ref 96–112)
Chloride: 106 mEq/L (ref 96–112)
Creatinine, Ser: 0.79 mg/dL (ref 0.4–1.2)
Creatinine, Ser: 0.81 mg/dL (ref 0.4–1.2)
Creatinine, Ser: 0.89 mg/dL (ref 0.4–1.2)
Creatinine, Ser: 0.93 mg/dL (ref 0.4–1.2)
GFR calc Af Amer: >60 mL/min (ref >60)
GFR calc Af Amer: >60 mL/min (ref >60)
GFR calc Af Amer: >60 mL/min (ref >60)
GFR calc Af Amer: >60 mL/min (ref >60)
GFR calc Af Amer: >60 mL/min (ref >60)
GFR calc non Af Amer: >60 mL/min (ref >60)
GFR calc non Af Amer: 58 mL/min — ABNORMAL LOW (ref >60)
GFR calc non Af Amer: >60 mL/min (ref >60)
Sodium: 137 mEq/L (ref 135–145)
Sodium: 138 mEq/L (ref 135–145)
Sodium: 139 mEq/L (ref 135–145)

## 2010-06-19 LAB — COMPREHENSIVE METABOLIC PANEL
AST: 46 U/L — ABNORMAL HIGH (ref 0–37)
BUN: 11 mg/dL (ref 6–23)
CO2: 27 mEq/L (ref 19–32)
Calcium: 8.6 mg/dL (ref 8.4–10.5)
Creatinine, Ser: 0.79 mg/dL (ref 0.4–1.2)
GFR calc Af Amer: >60 mL/min (ref >60)
GFR calc non Af Amer: >60 mL/min (ref >60)
Total Bilirubin: 0.8 mg/dL (ref 0.3–1.2)

## 2010-06-19 LAB — PROTIME-INR
INR: 1.1 (ref 0.00–1.49)
Prothrombin Time: 14.3 seconds (ref 11.6–15.2)

## 2010-06-19 LAB — BRAIN NATRIURETIC PEPTIDE: Pro B Natriuretic peptide (BNP): 211 pg/mL — ABNORMAL HIGH (ref 0.0–100.0)

## 2010-06-19 LAB — APTT
aPTT: 31 seconds (ref 24–37)
aPTT: 27 seconds (ref 24–37)

## 2010-06-19 LAB — TROPONIN I
Troponin I: 0.68 ng/mL (ref 0.00–0.06)
Troponin I: 0.25 ng/mL — ABNORMAL HIGH (ref 0.00–0.06)

## 2010-06-19 LAB — TSH: TSH: 1.942 u[IU]/mL (ref 0.350–4.500)

## 2010-06-19 LAB — LIPID PANEL
Triglycerides: 69 mg/dL (ref <150)
VLDL: 14 mg/dL (ref 0–40)

## 2010-06-20 ENCOUNTER — Encounter (HOSPITAL_COMMUNITY): Payer: Medicare Other

## 2010-06-22 ENCOUNTER — Encounter (HOSPITAL_COMMUNITY): Payer: Medicare Other

## 2010-06-25 ENCOUNTER — Encounter (HOSPITAL_COMMUNITY): Payer: Medicare Other

## 2010-06-27 ENCOUNTER — Encounter (HOSPITAL_COMMUNITY): Payer: Medicare Other

## 2010-06-29 ENCOUNTER — Encounter (HOSPITAL_COMMUNITY): Payer: Medicare Other

## 2010-07-02 ENCOUNTER — Encounter (HOSPITAL_COMMUNITY): Payer: Medicare Other

## 2010-07-04 ENCOUNTER — Encounter (HOSPITAL_COMMUNITY): Payer: Medicare Other | Attending: Cardiology

## 2010-07-04 DIAGNOSIS — Z9861 Coronary angioplasty status: Secondary | ICD-10-CM | POA: Insufficient documentation

## 2010-07-04 DIAGNOSIS — E7800 Pure hypercholesterolemia, unspecified: Secondary | ICD-10-CM | POA: Insufficient documentation

## 2010-07-04 DIAGNOSIS — K219 Gastro-esophageal reflux disease without esophagitis: Secondary | ICD-10-CM | POA: Insufficient documentation

## 2010-07-04 DIAGNOSIS — Z7902 Long term (current) use of antithrombotics/antiplatelets: Secondary | ICD-10-CM | POA: Insufficient documentation

## 2010-07-04 DIAGNOSIS — I4891 Unspecified atrial fibrillation: Secondary | ICD-10-CM | POA: Insufficient documentation

## 2010-07-04 DIAGNOSIS — I2510 Atherosclerotic heart disease of native coronary artery without angina pectoris: Secondary | ICD-10-CM | POA: Insufficient documentation

## 2010-07-04 DIAGNOSIS — Z79899 Other long term (current) drug therapy: Secondary | ICD-10-CM | POA: Insufficient documentation

## 2010-07-04 DIAGNOSIS — Z7982 Long term (current) use of aspirin: Secondary | ICD-10-CM | POA: Insufficient documentation

## 2010-07-04 DIAGNOSIS — I10 Essential (primary) hypertension: Secondary | ICD-10-CM | POA: Insufficient documentation

## 2010-07-04 DIAGNOSIS — I252 Old myocardial infarction: Secondary | ICD-10-CM | POA: Insufficient documentation

## 2010-07-04 DIAGNOSIS — Z5189 Encounter for other specified aftercare: Secondary | ICD-10-CM | POA: Insufficient documentation

## 2010-07-04 DIAGNOSIS — I209 Angina pectoris, unspecified: Secondary | ICD-10-CM | POA: Insufficient documentation

## 2010-07-04 DIAGNOSIS — M1990 Unspecified osteoarthritis, unspecified site: Secondary | ICD-10-CM | POA: Insufficient documentation

## 2010-07-06 ENCOUNTER — Encounter (HOSPITAL_COMMUNITY): Payer: Medicare Other

## 2010-07-09 ENCOUNTER — Encounter (HOSPITAL_COMMUNITY): Payer: Medicare Other

## 2010-07-11 ENCOUNTER — Encounter (HOSPITAL_COMMUNITY): Payer: Medicare Other

## 2010-07-12 ENCOUNTER — Telehealth: Payer: Self-pay | Admitting: Pulmonary Disease

## 2010-07-12 MED ORDER — HYDROCODONE-HOMATROPINE 5-1.5 MG/5ML PO SYRP: ORAL_SOLUTION | ORAL | Status: DC

## 2010-07-12 NOTE — Telephone Encounter (Signed)
Ok for pt hydromet  #6oz  1 tsp by mouth every 6 hours as needed for cough with 1 refill.  thanks

## 2010-07-12 NOTE — Telephone Encounter (Signed)
Pt c/o prod cough x 5-6 days and wheezing. Mucus is white to clear. She denies any sob, sore throat or fever. She wants a refill for Hydromet that she had in 03/2010. Pls advise.No Known Allergies

## 2010-07-12 NOTE — Telephone Encounter (Signed)
Spoke w/ Matt at [facility] and advised of pt rx for hydromet. Pt is also aware rx was sent to pharmacy and nothing further was needed

## 2010-07-13 ENCOUNTER — Encounter (HOSPITAL_COMMUNITY): Payer: Medicare Other

## 2010-07-16 ENCOUNTER — Inpatient Hospital Stay (HOSPITAL_COMMUNITY): Payer: Medicare Other

## 2010-07-16 ENCOUNTER — Encounter (HOSPITAL_COMMUNITY): Payer: Medicare Other

## 2010-07-16 ENCOUNTER — Ambulatory Visit: Payer: MEDICARE | Admitting: Adult Health

## 2010-07-16 ENCOUNTER — Ambulatory Visit (INDEPENDENT_AMBULATORY_CARE_PROVIDER_SITE_OTHER): Payer: Medicare Other | Admitting: Adult Health

## 2010-07-16 ENCOUNTER — Inpatient Hospital Stay (HOSPITAL_COMMUNITY)
Admission: AD | Admit: 2010-07-16 | Discharge: 2010-07-19 | DRG: 191 | Disposition: A | Discharge status: Home / Self Care | Payer: Medicare Other | Source: Ambulatory Visit | Attending: Pulmonary Disease | Admitting: Pulmonary Disease

## 2010-07-16 ENCOUNTER — Encounter: Payer: Self-pay | Admitting: Adult Health

## 2010-07-16 DIAGNOSIS — E0590 Thyrotoxicosis, unspecified without thyrotoxic crisis or storm: Secondary | ICD-10-CM | POA: Diagnosis present

## 2010-07-16 DIAGNOSIS — I25810 Atherosclerosis of coronary artery bypass graft(s) without angina pectoris: Secondary | ICD-10-CM

## 2010-07-16 DIAGNOSIS — M1610 Unilateral primary osteoarthritis, unspecified hip: Secondary | ICD-10-CM | POA: Diagnosis present

## 2010-07-16 DIAGNOSIS — I2510 Atherosclerotic heart disease of native coronary artery without angina pectoris: Secondary | ICD-10-CM | POA: Diagnosis present

## 2010-07-16 DIAGNOSIS — F411 Generalized anxiety disorder: Secondary | ICD-10-CM | POA: Diagnosis present

## 2010-07-16 DIAGNOSIS — E7800 Pure hypercholesterolemia, unspecified: Secondary | ICD-10-CM | POA: Diagnosis present

## 2010-07-16 DIAGNOSIS — J441 Chronic obstructive pulmonary disease with (acute) exacerbation: Secondary | ICD-10-CM

## 2010-07-16 DIAGNOSIS — M169 Osteoarthritis of hip, unspecified: Secondary | ICD-10-CM | POA: Diagnosis present

## 2010-07-16 DIAGNOSIS — I4892 Unspecified atrial flutter: Secondary | ICD-10-CM | POA: Diagnosis present

## 2010-07-16 DIAGNOSIS — E0580 Other thyrotoxicosis without thyrotoxic crisis or storm: Secondary | ICD-10-CM

## 2010-07-16 DIAGNOSIS — E559 Vitamin D deficiency, unspecified: Secondary | ICD-10-CM | POA: Diagnosis present

## 2010-07-16 DIAGNOSIS — K449 Diaphragmatic hernia without obstruction or gangrene: Secondary | ICD-10-CM | POA: Diagnosis present

## 2010-07-16 DIAGNOSIS — D649 Anemia, unspecified: Secondary | ICD-10-CM | POA: Diagnosis present

## 2010-07-16 DIAGNOSIS — J45901 Unspecified asthma with (acute) exacerbation: Secondary | ICD-10-CM

## 2010-07-16 DIAGNOSIS — I70209 Unspecified atherosclerosis of native arteries of extremities, unspecified extremity: Secondary | ICD-10-CM | POA: Diagnosis present

## 2010-07-16 DIAGNOSIS — T50995A Adverse effect of other drugs, medicaments and biological substances, initial encounter: Secondary | ICD-10-CM | POA: Diagnosis present

## 2010-07-16 DIAGNOSIS — J45909 Unspecified asthma, uncomplicated: Secondary | ICD-10-CM

## 2010-07-16 DIAGNOSIS — I10 Essential (primary) hypertension: Secondary | ICD-10-CM

## 2010-07-16 LAB — COMPREHENSIVE METABOLIC PANEL
ALT: 18 U/L (ref 0–35)
AST: 25 U/L (ref 0–37)
Albumin: 3.6 g/dL (ref 3.5–5.2)
Alkaline Phosphatase: 99 U/L (ref 39–117)
Calcium: 8.6 mg/dL (ref 8.4–10.5)
GFR calc Af Amer: >60 mL/min (ref >60)
Potassium: 3.9 mEq/L (ref 3.5–5.1)
Sodium: 139 mEq/L (ref 135–145)
Total Protein: 7 g/dL (ref 6.0–8.3)

## 2010-07-16 LAB — DIFFERENTIAL
Basophils Absolute: 0 10*3/uL (ref 0.0–0.1)
Eosinophils Relative: 2 % (ref 0–5)
Lymphs Abs: 3.2 10*3/uL (ref 0.7–4.0)
Monocytes Absolute: 0.4 10*3/uL (ref 0.1–1.0)
Monocytes Relative: 5 % (ref 3–12)
Neutrophils Relative %: 47 % (ref 43–77)

## 2010-07-16 LAB — CBC
MCHC: 32.9 g/dL (ref 30.0–36.0)
RDW: 15.7 % — ABNORMAL HIGH (ref 11.5–15.5)
WBC: 7 10*3/uL (ref 4.0–10.5)

## 2010-07-16 NOTE — Progress Notes (Signed)
Subjective:    Patient ID: Virginia Casey, female    DOB: September 04, 1924, 77 y.o.   MRN: [id number]  HOSPITAL ADMISSION--H/P  HPI     77 y/o BF with known hx of HTN, Atrial Flutter, CAD     07/16/10 Acute OV  Pt presents for an acute office visit. Complains of congested cough, wheezing x 1 week. Was called in hydromet few days ago without much help. Cough is rattling. No fever or hemoptysis.  She is getting worse and is now very weak.  Coughing up white mucus. Hard to get anything up .  Feels worse today. More dyspnea. Feels very weak. Does not think she can go home.   Of note she is on Amiodarone and ACE inhibitor. Last cxr 03/2010 w/ no acute changes.   Will admit for evaluation and tx at hospital -see admit note   She was given 2 xopenx neb without much improvement.    PMH:   HYPERTENSION (ICD-401.9) - on RAMIPRIL 5mg -2Bid per [facility] & off Imdur due to HA... BP= 140/76 & not checking BP's at home... she denies visual changes, CP, palipit, syncope, dyspnea, edema, etc...   ATHEROSCLEROTIC HEART DISEASE (ICD-414.00) & PERIPHERAL VASCULAR DISEASE (ICD-443.9) - regularly on ASA 81mg /d, & off Plavix now... followed by [facility] for Cards (we do not have his notes to review med changes)>>  ~ hosp in Jan08 w/ syncope and MRI Brain showed mild atrophy & sm vessel dis; MRA showed basilar art narrowing & mod stenosis in RICA cavernous segment... CDopplers however were WNL w/ antegrade vertebral flow, and no signif ICA stenoses detected...  ~ hosp Feb10 by [facility] w/ non-STEMI- s/p PTCA and stent in the LAD.  ~ hosp 7/11 by [facility] w/ CP, Abn Myoview & cath w/ 70% midLAD in-stent resteosis; s/p PTCA & started back on Plavix x 38mo.  ~ she is in Cardiac Rehab & really likes it...   ATRIAL FLUTTER, PAROXYSMAL (ICD-427.32) - this occured 2/10 following her cath, PTCA, stent... he diagnosed flutter w/ 2:1 block... currently taking AMIODARONE 200mg - 1/2 tab daily...   HYPERCHOLESTEROLEMIA  (ICD-272.0) - on CRESTOR 20mg /d... weight is 179#.[name].  ~ FLP 11/08 showed TChol 161, TG 39, HDL 62, LDL 92... continue same med.  ~ FLP 5/09 showed TChol 209, TG 70, HDL 74, LDL 124... rec- better diet, get weight down!  ~ FLP in hosp 2/10 showed TChol 130, TG 69, HDL 45, LDL 71  ~ she reports that [facility] does fasting labs...  ~ FLP 7/11 in hosp showed TChol 105, TG 30, HDL 66, LDL 33  ~ FLP 2/12 on Cres20 showed TChol 97, TG 38, HDL 50, LDL 39... copy to Cards ?decr to 10mg ?   HIATAL HERNIA (ICD-553.3) - prev on Nexium & Carafate Liq- but changed to PEPCID 20mg Bid by [facility] due to need for Plavix... she has a giant HH measuring 10cm per DrSam, w/ erosions in the pouch... last EGD by DrSam 4/08... hernia is seen on her CXRs.   DIVERTICULOSIS OF COLON (ICD-562.10) & COLONIC POLYPS (ICD-211.3)  ~ colonoscopy 11/06 showed divertics and 2- 4mm polyps... f/u planned in 4-61yrs...  ~ [facility] 11/10 w/ lower GIB believed due to divertics... colonoscopy by DrJacobs showed severe divertics, no bleeding site, 7mm polyp removed...   \DEGENERATIVE JOINT DISEASE (ICD-715.90) & HIP PAIN, RIGHT (ICD-719.45) - currently taking OTC meds only but DrDean rec Vicodin for as needed use (she prefers Tylenol)... Eval by DrDean 3/09 w/ bone-on-bone right hip, tried injection & pain meds.Marland KitchenMarland Kitchen  right THR done 12/09...   VITAMIN D DEFICIENCY (ICD-268.9)  ~ labs 5/09 showed Vit D level = 9... pt started on 50K weekly but she switched on her own to 1000u/d OTC.  ~ labs 2/12 showed Vit D level = 18... asked to incr Vit D supplement to 2000 u daily.   ANXIETY (ICD-300.00) - prev on Chlorazepate but currently not taking anything for nerves...   ANEMIA (ICD-285.9) - prev on Niferex 150mg /d, now FeSO4 325mg /d w/ Vit C 500mg ... 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  ~ last blood count 11/08 showed Hg=13.7, Fe=56...  ~ labs 5/09 showed Hg= 14.2.[name].  ~ labs 2/10 in hosp showed Hg= 13  ~ [facility] 11/10 w/ lower GIB, & Hg  down to 8.6- tc 1u PC up to 9.3.Marland KitchenMarland Kitchen  ~ labs 12/10 showed Hg= 10.6.[name]. rec> continue oral Fe w/ VitC...  ~ [facility] labs 7/11 showed Hg= 11.6 - 10.5, MCV= 70... rec> restart Fe.  ~ labs 2/12 showed Hg= 12.4, MCV= 77, Fe= 56 (sat=10%)... continue Fe w/ VitC.  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            :.    Current outpatient prescriptions:acetaminophen (TYLENOL) 325 MG tablet, Take 650 mg by mouth every 6 (six) hours as needed.  , Disp: , Rfl: ;  amiodarone (PACERONE) 200 MG tablet, Take 1/2 tab by mouth once daily , Disp: , Rfl: ;  aspirin 81 MG tablet, Take 81 mg by mouth daily.  , Disp: , Rfl: ;  Cholecalciferol (VITAMIN D) 1000 UNITS capsule, Take 1,000 Units by mouth daily.  , Disp: , Rfl:  famotidine (PEPCID) 20 MG tablet, Take 1 tablet by mouth Twice daily., Disp: , Rfl: ;  ferrous gluconate (FERGON) 325 MG tablet, Take 325 mg by mouth daily with breakfast. With vitamin C tablet , Disp: , Rfl: ;  HYDROcodone-homatropine (HYDROMET) 5-1.5 MG/5ML syrup, Take 1 tsp by mouth every 6 hours as needed for cough, Disp: 180 mL, Rfl: 1 nitroGLYCERIN (NITROSTAT) 0.4 MG SL tablet, Place 0.4 mg under the tongue every 5 (five) minutes as needed. May repeat x3 , Disp: , Rfl: ;  ramipril (ALTACE) 5 MG capsule, Take 2 capsules by mouth every morning and 2 by mouth at bedtime, Disp: , Rfl: ;  rosuvastatin (CRESTOR) 20 MG tablet, Take 20 mg by mouth daily.  , Disp: , Rfl: ;  vitamin C (ASCORBIC ACID) 500 MG tablet, Take 500 mg by mouth daily. With iron tablet , Disp: , Rfl:   NKDA   FH noncontributory SH: she is retired. Never smoker No etoh   Review of Systems Constitutional:   No  weight loss, night sweats,  Fevers, chills,    HEENT:   No headaches,  Difficulty swallowing,  Tooth/dental problems, or  Sore throat,                No sneezing, itching, ear ache, nasal congestion, post nasal drip,   CV:  No chest pain,  Orthopnea,  PND, swelling in lower extremities, anasarca, dizziness, palpitations, syncope.   GI  No heartburn, indigestion, abdominal pain, nausea, vomiting, diarrhea, change in bowel habits, loss of appetite, bloody stools.   Resp: + cough , wheezing Neg for hemoptysis     Skin: no rash or lesions.  GU: no dysuria, change in color of urine, no urgency or frequency.  No flank pain, no  hematuria   MS:  No joint pain or swelling.  No decreased range of motion.  No back pain.  Psych:  No change in mood or affect. No depression or anxiety.  No memory loss.         Objective:   Physical Exam GEN: A/Ox3; pleasant , NAD, elderly.   HEENT:  [place]/AT,  EACs-clear, TMs-wnl, NOSE-clear, THROAT-clear, no lesions, no postnasal drip or exudate noted.   NECK:  Supple w/ fair ROM; no JVD; normal carotid impulses w/o bruits; no thyromegaly or nodules palpated; no lymphadenopathy.  RESP Coarse BS w/ exp wheezing. Loud upper airway psuedowheeze. No stridor notedno accessory muscle use, no dullness to percussion  CARD:  . RRR, no m/r/g   GI:   Soft & nt; nml bowel sounds; no organomegaly or masses detected.  Musco: Warm bil, no deformities or joint swelling noted.   Neuro: alert, no focal deficits noted.    Skin: Warm, no lesions or rashes         Assessment & Plan:  1. Acute Asthmatic Bronchitis  Plan:  Severe exacerbation  Will Admit check xray to r/o PNA Of note on ACE inhibitor with considerable upper airway wheezing- will change to ARB for now.  Also on AMiodarone. -will continue. CHeck ESR and xray . If abn. Consider cards consult.  Begin empiric antibitoics w Avelox  Begin aggressive pulmonary hygiene with xopenex nebs.  Begin Steroid therapy.  [name]  PPI for GI protection  Hepain for DVt proph.    2. Hx of Atrial flutter  Cont current regimen.  Check ekg.   3. Hx of HTN Change ACE to ARB due to severe upper airway wheezing.

## 2010-07-16 NOTE — Patient Instructions (Signed)
Admit to hospital 

## 2010-07-16 NOTE — Assessment & Plan Note (Addendum)
Severe exacerbation  Will Admit check xray to r/o PNA Of note on ACE inhibitor with considerable upper airway wheezing- will change to ARB for now.  Also on AMiodarone. -will continue. CHeck ESR and xray . If abn. Consider cards consult.  Begin empiric antibitoics w Avelox  Begin aggressive pulmonary hygiene with xopenex nebs.  Begin Steroid therapy.  [name]  PPI for GI protection  Heparin for DVT prophylaxis.

## 2010-07-17 LAB — T3, FREE: T3, Free: 2 pg/mL — ABNORMAL LOW (ref 2.3–4.2)

## 2010-07-17 NOTE — Cardiovascular Report (Signed)
NAME:  Virginia Casey, Virginia Casey                  ACCOUNT NO.:  [id number]   MEDICAL RECORD NO.:  [id number]          PATIENT TYPE:  INP   LOCATION:  2907                         FACILITY:  [facility]   PHYSICIAN:  Eduardo Osier. Sharyn Lull, M.D. DATE OF BIRTH:  02/07/1925   DATE OF PROCEDURE:  04/18/2008  DATE OF DISCHARGE:                            CARDIAC CATHETERIZATION   PROCEDURES:  1. Left cardiac catheterization with selective left and right coronary      angiography, left ventriculography via right groin using Judkins      technique.  2. Successful percutaneous transluminal coronary angioplasty to mid      left anterior descending using 2.5 x 12 mm long Voyager balloon.  3. Successful deployment of 2.75 x 15 mm long XIENCE drug-eluting      stent in mid left anterior descending.  4. Successful post dilatation of this stent using 3.0 x 8 mm long Lyndon      Voyager balloon.   INDICATION FOR THE PROCEDURE:  Ms. Sybert is 77 year old black female  with past medical history significant   DICTATION ENDED AT THIS POINT.      Eduardo Osier. Sharyn Lull, M.D.  Electronically Signed     MNH/MEDQ  D:  04/18/2008  T:  04/18/2008  Job:  [id number]

## 2010-07-17 NOTE — Discharge Summary (Signed)
NAME:  Virginia Casey, Virginia Casey                  ACCOUNT NO.:  [id number]   MEDICAL RECORD NO.:  [id number]          PATIENT TYPE:  INP   LOCATION:  3729                         FACILITY:  [facility]   PHYSICIAN:  Mohan N. Sharyn Lull, M.D. DATE OF BIRTH:  1924/05/03   DATE OF ADMISSION:  04/18/2008  DATE OF DISCHARGE:  04/22/2008                               DISCHARGE SUMMARY   ADMITTING DIAGNOSES:  1. Acute non-Q-wave myocardial infarction.  2. Uncontrolled hypertension.  3. Status post paroxysmal atrial flutter with 2:1 block.  4. Hypercholesteremia.  5. Gastroesophageal reflux disease.  6. Degenerative joint disease.   DISCHARGE DIAGNOSES:  1. Status post non-Q-wave myocardial infarction.  2. Status post percutaneous transhepatic cholecystoscopy stenting to      left anterior descending.  3. Hypertension.  4. Status post paroxysmal atrial fibrillation/flutter.  5. Hypercholesteremia.  6. Gastroesophageal reflux disease.  7. Degenerative joint disease.   DISCHARGE HOME MEDICATIONS:  1. Enteric-coated aspirin 325 mg 1 tablet daily.  2. Plavix 75 mg 1 tablet daily with food.  3. Amiodarone 200 mg 1 tablet daily.  4. Toprol-XL 25 mg half tablet daily.  5. Altace 5 mg 1 capsule daily.  6. Crestor 20 mg 1 tablet daily.  7. Pepcid 20 mg 1 tablet twice daily.  8. Nitrostat 0.4 mg sublingual, use as directed.   DIET:  Low salt, low cholesterol.   ACTIVITY:  Increase activity slowly as tolerated.  No lifting, driving,  lifting, pushing, or pulling for 1 week.  Post PTCA stent instructions  have been given.  Follow up with me in 1 week.  The patient will be  scheduled for phase II cardiac rehab as outpatient.  Presently, she is  in ortho rehab secondary to her hip surgery.   BRIEF HISTORY AND HOSPITAL COURSE:  Virginia Casey is an 77 year old black  female with past medical history significant for hypertension,  hypercholesteremia, GERD, morbid obesity, and degenerative joint  disease.  She came to  the ER via EMS complaining of retrosternal chest  pain which started around 8:00 p.m., grade 8/10, associated with nausea  and mild shortness of breath.  Initially did not seek any medical  attention as she thought it is acid reflux.  As pain got worse around  3:00 a.m. in the morning, she called EMS and brought to the ER.  EKG  done in the ER showed normal sinus rhythm with nonspecific ST-T wave  changes.  The patient also stated chest pain radiated to the back and  was noted to have elevated CPK-MB and troponin I.  CT of the chest was  done in the ED which was negative for PE or dissection.  The patient  denies such episodes of chest pain in the past.   PAST MEDICAL HISTORY:  As above.   PAST SURGICAL HISTORY:  She had right hip surgery in December 2009.   SOCIAL HISTORY:  She is widowed, lives with granddaughter.  No history  of smoking or alcohol abuse.  She is retired.  She worked as a  Dispensing optician in the past.   FAMILY HISTORY:  Father died of cancer.  Mother died of MI at the age of  81.  One brother died of MI at age of 53.   ALLERGIES:  No known drug allergies.   MEDICATION AT HOME:  She was on verapamil, Plavix, Lasix, Lescol, and  Nexium.   PHYSICAL EXAMINATION:  GENERAL:  She was alert and oriented x3, in no  acute distress.  VITAL SIGNS:  Blood pressure was 160/83, pulse was 79 and regular with  occasional atrial flutter with 2:1 block on the monitor.  She  spontaneously converted to sinus rhythm after 5 mg of IV Lopressor in  the ED.  HEENT:  Conjunctivae were pink.  NECK:  Supple.  No JVD.  LUNGS:  She had decreased breath sounds at bases.  CARDIOVASCULAR:  S1 and S2 were normal.  There was a soft systolic  murmur.  ABDOMEN:  Soft.  Bowel sounds were present.  Nontender.  EXTREMITIES:  There was no clubbing, cyanosis, or edema.   ADMISSION LABORATORY DATA:  Her first set of CK-MB was 25.4 and troponin  I was 0.37.  Second set; total CK was 278, MB 24.8,  relative index 8.9,  and troponin I was 0.68.  Repeat CK post PCI was 481, MB 47.5, relative  index 9.9, and troponin I was 6.18.  Next set CK was 166, MB 6.0, and  troponin 2.45.  Yesterday's CK was 131, MB 2.5, and troponin I is  trending down to 1.62.  Her hemoglobin was 13.5, hematocrit 40.8, and  white count of 11.1.  Sodium was 137, potassium 3.9, BUN 15, creatinine  0.88, and blood sugar was 120.  Repeat fasting blood sugar was 99.  On  February 17, fasting blood sugar was 83.  Her cholesterol was 130, HDL  was 45, LDL was 71, and triglycerides were 69.   BRIEF HOSPITAL COURSE:  The patient was directly taken to the cath lab  from the ED and underwent left cardiac cath with selective left and  right coronary angiography and PTCA stenting to LAD as per procedure  report.  The patient did not have any further episodes of chest pain  during the hospital stay.  The patient had frequent episodes of  paroxysmal atrial fibrillation and was started on IV amiodarone and  subsequently was switched to p.o. amiodarone.  The patient stayed in  sinus rhythm after switching to phase I p.o. amiodarone.  Phase I  cardiac rehab  was called.  The patient has been ambulating in hallway without any  problems.  Her groin is stable with no evidence of hematoma or bruit.  Her cardiac enzymes are gradually trending downwards.  The patient will  be discharged home on above medications and will be followed up in my  office in 1 week.       Eduardo Osier. Sharyn Lull, M.D.  Electronically Signed     MNH/MEDQ  D:  04/22/2008  T:  04/23/2008  Job:  [id number]

## 2010-07-17 NOTE — Op Note (Signed)
NAME:  Virginia Casey                  ACCOUNT NO.:  [id number]   MEDICAL RECORD NO.:  [id number]          PATIENT TYPE:  INP   LOCATION:  5014                         FACILITY:  [facility]   PHYSICIAN:  Burnard Bunting, M.D.    DATE OF BIRTH:  1924/07/16   DATE OF PROCEDURE:  02/02/2008  DATE OF DISCHARGE:                               OPERATIVE REPORT   PREOPERATIVE DIAGNOSIS:  Right hip arthritis.   POSTOPERATIVE DIAGNOSIS:  Right hip arthritis.   PROCEDURE:  Right total hip replacement.   SURGEON:  Burnard Bunting, MD   ASSISTANT:  Jerolyn Shin. Lavender, MD   ANESTHESIA:  General endotracheal.   ESTIMATED BLOOD LOSS:  400.   DRAINS:  Hemovac x1.   INDICATIONS:  Virginia Casey is an 77 year old patient with right hip  arthritis and presents now for operative management after explanation of  risks and benefits.   IMPLANTS:  DePuy ASR cup 50, ASR Taper Sleeve -3; femoral implant, size  45 ball; and SROM hip stem 80 x 13 x 160 x 36 standard neck.   PROCEDURE IN DETAIL:  The patient was brought to the operating room  where general endotracheal anesthesia was induced.  Preoperative  antibiotics were administered.  Time-out was called.  The patient was  placed in lateral decubitus position with left axilla and left peroneal  nerve well-padded; right hip, leg, and foot were then prepped,  prescrubbed with alcohol and Betadine, which allowed to air dry then  prepped including the foot with DuraPrep solution and draped in a  sterile manner.  Collier Flowers was used to scrub the operative field.  The hip  was flexed, posterior __________ was utilized.  Skin and subcutaneous  tissues were sharply divided.  Fascia lata was encountered and divided.  Gluteus maximus muscles were split in direction of their fibers.  Sciatic nerve was palpated at this time and protected all times during  the case.  Charnley retractor was placed.  Piriformis tendon was  identified and detached.  External rotators were then  detached.  Capsule  was split in T-shaped fashion and was tagged with #1 Ethibond suture and  #1 Tycron suture.  Hip was dislocated in accordance with preoperative  templating and with trial of femoral neck cut was made.  Lateralization  was then performed at the trochanteric region.  The canal was reamed up  to 13.5 with good chatter obtained.  At this time, the acetabular  retractor was placed in the anterior rim of the acetabulum.  The  acetabulum was then reamed up to 50 mm in about 45 degrees of adduction  to about 50 to 20 degrees of anteversion.  Following acetabular reaming,  an acetabular cup was tapped into position with good fixation achieved.  At this time, the __________ reaming was performed on the femur, spout  was placed, which came within 2 mm of femoral neck cut.  A +6 and a 36  standard stem were then placed and the 36 standard stem with -3 neck  adapter gave 3-4 mm of __________ excellent stability, full extension,  and  external rotation.  Position of sleep was 90 degrees of hip flexion,  10 degrees of adduction, and 70 degrees of internal rotation.  At this  time, trial femoral components were removed and thorough irrigation was  performed.  True femoral components were placed with same reduction  parameters and stability parameters maintained.  Intraoperative  radiograph was obtained before implantation of final implants and leg  lengths were approximately equal and the cup position was good.  At this  time, thorough irrigation was again performed.  Hemovac drain was  placed.  Sciatic nerve was again palpated and found to be intact.  The  capsule closed using #1 Vicryl suture.  Piriformis tendon was tagged  back to the capsule using a #1 Vicryl suture, fascia lata was closed  using a #1 Vicryl suture in an interrupted fashion.  Subcutaneous tissue  was closed in 2 layers of the 0 Vicryl suture followed by interrupted  and inverted 2-0 Vicryl suture and staples.   Mepilex dressing was  placed.  Knee immobilizer was placed.  Leg lengths were approximately  equal at the conclusion of the case.  Dr. Lenny Pastel assistance was  required all times during the case for retraction of important  neurovascular structures and limb positioning.  His assistance was a  medical necessity.       Burnard Bunting, M.D.  Electronically Signed     GSD/MEDQ  D:  02/02/2008  T:  02/03/2008  Job:  [id number]

## 2010-07-17 NOTE — Discharge Summary (Signed)
NAME:  Virginia Casey, Virginia Casey                  ACCOUNT NO.:  [id number]   MEDICAL RECORD NO.:  [id number]          PATIENT TYPE:  INP   LOCATION:  5014                         FACILITY:  [facility]   PHYSICIAN:  Burnard Bunting, M.D.    DATE OF BIRTH:  1924-06-29   DATE OF ADMISSION:  02/02/2008  DATE OF DISCHARGE:  02/08/2008                               DISCHARGE SUMMARY   DISCHARGE DIAGNOSES:  1. Right hip arthritis.  2. Hypertension.   OPERATIONS AND PROCEDURES:  Right total hip replacement performed on  February 02, 2008.   HOSPITAL COURSE:  Virginia Casey is an 77 year old female with right hip  arthritis.  She underwent right total hip replacement on February 02, 2008.  She tolerated the procedure well without immediate complications.  She was transferred to recovery room in stable condition.  Leg lengths  were approximately equal.  Dorsiflexion and plantar flexion of the foot  was intact.  Postop radiograph showed prosthesis to be in good position.  She did have a bump in her creatinine to 1.9 on postop day #1, but it  returned to normal on postop day #2 to 0.9.  She received 1 unit of  packed red blood cell transfusion on February 04, 2008, for hemoglobin of  8.1.  INR was therapeutic at the time of discharge.  She was mobilizing  and was ambulating in the hall with physical therapy assist on walker.  At the time of discharge, her incision was intact.  She was discharged  to skilled nursing facility in good condition for continued  mobilization.   DISCHARGE MEDICATIONS:  Include:  1. Calan SR 180 one p.o. at bedtime.  2. Protonix 80 mg p.o. daily.  3. Carafate 1 g p.o. b.i.d.  4. Iron complex 150 mg p.o. daily.  5. Coumadin 5 mg p.o. daily to INR 2-2.5.  6. Colace 100 mg p.o. b.i.d.  7. Lescol 40 mg p.o. daily.  8. Robaxin 500 mg p.o. q.8 h. p.r.n. spasm.  9. Vicodin ES 1 p.o. q.4-6 h. p.r.n. pain.   She will follow up with me in 7 days for suture removal.      Burnard Bunting, M.D.  Electronically Signed     GSD/MEDQ  D:  02/08/2008  T:  02/08/2008  Job:  [id number]

## 2010-07-17 NOTE — Assessment & Plan Note (Signed)
[facility]                         GASTROENTEROLOGY OFFICE NOTE   NAME:Virginia Casey, Virginia Casey                         MRN:          [id number]  DATE:07/16/2006                            DOB:          1925-01-10    This very nice patient comes in to discuss her procedure she had before.  She is still off her arthritis medicine and wanted to know whether she  could resume.  No other problems.  She said she basically feels well.  We discussed her procedure.  She has a huge hernia 10 cm.  I told her it  was as big as they come.  It had multiple erosions within the hernia  pouch, and I thought this was probably responsible for her anemia.  She  had a pulsion diverticulum and an old ulcer, and I told her I thought  this was her main problem; that she could just treat it as we are doing  and hopefully she will get better especially her anemia.  We repeated  her labs with CBC and serum iron, and we told her to get some Hemoccults  in two weeks.  She is a patient of Dr. Alroy Dust.  I told her that she  should continue her iron, and I gave her some along with her other  medications.  At the time of this dictation I do have her studies back  which is really nice.  Her hemoglobin now is 12.2.  MCV is 72.5 but her  iron is 48.  I think she should continue on her iron, continue taking  these pills for lifetime such as the __________ to take care of her  hiatal hernia and have followup CBC and Hemoccults in six weeks.     Ulyess Mort, MD  Electronically Signed    SML/MedQ  DD: 07/16/2006  DT: 07/16/2006  Job #: [id number]   cc:   Lonzo Cloud. Kriste Basque, MD

## 2010-07-18 ENCOUNTER — Encounter (HOSPITAL_COMMUNITY): Payer: Medicare Other

## 2010-07-18 LAB — CBC
HCT: 35.7 % — ABNORMAL LOW (ref 36.0–46.0)
Hemoglobin: 12.3 g/dL (ref 12.0–15.0)
MCV: 68 fL — ABNORMAL LOW (ref 78.0–100.0)
Platelets: 169 10*3/uL (ref 150–400)
RBC: 5.25 MIL/uL — ABNORMAL HIGH (ref 3.87–5.11)
WBC: 14.3 10*3/uL — ABNORMAL HIGH (ref 4.0–10.5)

## 2010-07-18 LAB — BASIC METABOLIC PANEL
BUN: 21 mg/dL (ref 6–23)
CO2: 24 mEq/L (ref 19–32)
Chloride: 105 mEq/L (ref 96–112)
Potassium: 4.3 mEq/L (ref 3.5–5.1)

## 2010-07-20 ENCOUNTER — Encounter (HOSPITAL_COMMUNITY): Payer: Medicare Other

## 2010-07-20 NOTE — Consult Note (Signed)
NAME:  Virginia, RENSTROM NO.:  [id number]   MEDICAL RECORD NO.:  [id number]          PATIENT TYPE:  INP   LOCATION:  4704                         FAC[facility]   PHYSICIAN:  Virginia Salvia, MD, FACCDATE OF BIRTH:  11-26-1924   DATE OF CONSULTATION:  03/08/2006  DATE OF DISCHARGE:                                 CONSULTATION   Thank you very much for asking [place] to see [facility] in consultation  because of syncope.   Virginia Casey is a 77 year old woman with a past medical history notable for  hypertension, hypercholesterolemia, who has actually been quite fit.  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  She had some minimal  warning.  She was unable to speak.  She did not recognize the prodrome.  Her name was called. By the time the woman with whom she was speaking  who got up off the chair and got over to her and shook her, she was  awake.  She had not lost postural tone sufficient to cause her to fall  off the bar stool on which she was sitting.  She was not described as  diaphoretic or flushed.  The patient denies nausea.  She did vomit up  her coffee.  She was then helped from the bar stool to an easy chair.  As she was walking from one site to the other, she felt better although  she describes feeling weak when she first stood.  She had residual 30  minutes of the light headedness, vital signs were not recorded.  She was  ultimately brought back to [facility] for further evaluation from  [facility].  She has been on the monitor.  This is notable for a long RP  tachycardia as described below.   She has, as noted, no previous episodes of syncope or presyncope.  She  has no known heart disease.  She has no neurological disease and no  prior strokes or TIAs.  She has had a recent cold and she has had some  coughing,  but denies coughing prior to the  onset of her episode.   PAST MEDICAL HISTORY:  Noncontributory.  She does have:  1. Hypercholesterolemia.  2. Recent cough as noted.  3. Degenerative joint disease.  4. Some anxiety.  5. GE reflux disease.   PAST SURGICAL HISTORY:  Notable for tubal ligation.   MEDICATIONS:  Include aspirin, Covera 180, Lescol, ranitidine.   ALLERGIES:  She has no known drug allergies.   SOCIAL HISTORY:  She is widowed.  She has three children, two surviving.  She has grandchildren and great-grandchildren.   PHYSICAL EXAMINATION:  GENERAL:  She is an elderly African American female appearing her stated  age of 54.  VITAL SIGNS:  Her blood pressure is 148/80 with a pulse of 85.  HEENT:  No xanthoma.  NECK:  Neck veins were flat, carotids 2+ and equal bilaterally without  bruits.  BACK:  Without kyphosis or scoliosis.  LUNGS:  Clear.  HEART:  Sounds were regular with an early systolic murmur.  ABDOMEN:  Soft with active bowel sounds.  EXTREMITIES:  Femoral pulses not exam. Distal pulses were intact.  There  is no clubbing, cyanosis or edema.  NEUROLOGICAL:  Exam was grossly normal.  SKIN:  Warm and dry.   Electrocardiogram dated January 3 demonstrated sinus rhythm at 74,  intervals were 0.5, 0.06, 0.38, with a QTC of 0.42, electrocardiogram  was, otherwise, normal.  Telemetry from March 08, 2006, at 11:15 today  demonstrates the onset of a long RP tachycardia, the onset and offset of  which are seen.  There is no significant post termination pause.  The  rest of her evaluation included a broadly negative neurological  evaluation as well as the echo noted previously with normal left  ventricular function.   IMPRESSION:  1. Syncope abrupt in onset and offset without prodrome or significant      residual.  2. Normal EF and normal electrocardiogram.  3. Long RP SVT at a cycle length of 500 milliseconds.  4. Hypertension.  5. Recent cold with a cough but  unrelated to number 1.   The story is most suggestive of abrupt bradycardia with its rapid onset  and offset. Recurrence rates are about 40%.  Given that fact, non-  specific intervention is not appropriate.  Event recording may be useful  in this group of patients even with a normal QRS given the history.  An  auto-detection monitor may help [place] identify a recurrent event.  However,  even patients with heart block, the time to recurrence is about 48 days  and so the likelihood that this type of monitor will be useful is less.  In the event that she does have recurrent events, implantation of a loop  recorder would be the next appropriate diagnostic tool if a specific  arrhythmia were not observed.   Thank you for the consultation.      Virginia Salvia, MD, [facility]  Electronically Signed     SCK/MEDQ  D:  03/08/2006  T:  03/08/2006  Job:  [id number]   cc:   [facility]. Virginia Basque, MD

## 2010-07-20 NOTE — Procedures (Signed)
EEG NUMBER:  Is [phone].   CLINICAL INFORMATION:  This patient is being evaluated for a syncopal  episode.   TECHNICAL DESCRIPTION:  This EEG was recorded during the awake and  drowsy states.  There was no evidence of any stage II sleep present  during this EEG.  The background activity shows well-formed alpha  rhythms in the 9-10 Hz range.  There is much eye movement artifact.  Intermittently during this EEG, there is some frontal intermittent  rhythmic delta activity present.  There is no evidence of any  epileptiform activity.  Photic stimulation did not produce a drive  response occipital head regions.  Hyperventilation testing produced no  significant abnormalities.   IMPRESSION:  This is a mildly abnormal electroencephalogram showing  evidence of frontal intermittent rhythmic delta activity, so called  FIRDA, usually seen with toxic and metabolic disorders and should be  taken into account the clinical state of the patient.  No other definite  abnormalities were seen on this study.           ______________________________  Genene Churn. Sandria Manly, M.D.     [id number]  D:  03/07/2006 16:37:42  T:  03/07/2006 20:46:18  Job #:  [id number]

## 2010-07-20 NOTE — Discharge Summary (Signed)
NAME:  [facility]ia [facility], [facility]                  ACCOUNT NO.:  [id number]   MEDICAL RECORD NO.:  [id number]          PATIENT TYPE:  INP   LOCATION:  4704                         FACILITY:  [facility]   PHYSICIAN:  Lonzo Cloud. Kriste Basque, MD     DATE OF BIRTH:  04/17/1924   DATE OF ADMISSION:  03/06/2006  DATE OF DISCHARGE:  03/11/2006                               DISCHARGE SUMMARY   FINAL DIAGNOSES:  1. Syncopal spell of uncertain etiology.  2. Arteriosclerotic peripheral vascular disease with abnormal MRI of      the brain showing mild atrophy and moderate small vessel disease;      and MRA showing moderate stenosis of the right internal carotid      anterior cavernous segment and basilar artery narrowing.  3. Cardiac consultation from Dr. Berton Mount indicating possible      bradyarrhythmia and etiology for syncope and recommendation for an      outpatient auto detection device monitor; follow up with Dr. Graciela Husbands      after discharge.  4. History of asthmatic bronchitis.  5. History of hypertension - controlled on medications.  6. History of hypercholesterolemia - controlled on Lescol.  7. History of anemia treated with Niferex.  8. History of hiatus hernia and gastroesophageal reflux disease on      ranitidine.  9. History of diverticulosis and colon polyps with last colonoscopy in      November of 2006 and 3-year followup recommended.  10.History of degenerative arthritis in the C-spine and previous      rotator cuff tear.  11.History of anxiety on clorazepate.   BRIEF HISTORY AND PHYSICAL:  The patient is a 77 year old black female  known to me with problems as listed above.  She presented to the office  on March 06, 2005 with a syncopal spell on the day prior.  The patient  works as a Social worker and was sitting with a family in [place] when this  episode occurred.  She described sitting at the breakfast table early in  the morning, and felt myself going out.  She leaned over and passed  out for a few  seconds only.  She awoke in the same position and felt  strange; she was nauseated and vomited once; but denied any stool or  urine incontinence, tongue biting, tonic-clonic activity, etc.  She  denied any chest pain or palpitations, etc.  She states there was no  warning to this episode, and she has had no previous or subsequent  episodes.  While in [place], the patient refused to go to the emergency  room and returned to [place] and came into my office the very  next morning and was hospitalized on.  She was quite anxious and upset,  stated she was feeling poorly and needed hospitalization.   PAST MEDICAL HISTORY:  As noted, the patient has a history of asthmatic  bronchitis in the past and had a refractory episode requiring  hospitalization in 2000.  She was treated with albuterol and Flovent at  that time, but uses these just as needed along with  Mucinex at present.  She has a history of hypertension which is treated with Covera h.s. with  good control with blood pressures in the 140/80 range at home, per the  patient's history.  She has a history of hypercholesterolemia and takes  Lescol XL 80, along with diet.  Her last cholesterol in July of 2007 was  178 with an HDL 58 and an LDL of 108.  She has a history of mild anemia  with a hemoglobin of 11.8 earlier this year and takes Niferex 1 tablet  daily.  She has a known hiatus hernia which is easily seen on her chest  x-rays and some gastroesophageal reflux disease which we treat with  ranitidine.  She has a history of diverticulosis, hemorrhoids and an  adenomatous polyp.  Her last colonoscopy in November of 2006 showed a  tubular adenoma about 3 mm in size, and followup recommended by Dr. Victorino Dike in about 3 years.  She has a history of degenerative arthritis  with known C-spine disease, and a previous rotator cuff tear.  She has  seen Dr. Ophelia Charter in the past.  She has a history of anxiety and takes  clorazepate for this.   She has refused flu shots in the past.   ALLERGIES:  She has no known drug allergies.   SOCIAL HISTORY:  She is a nonsmoker.   PHYSICAL EXAMINATION:  Physical examination on admission revealed a 77-  year-old black female in no acute distress.  Blood pressure 160/90,  pulse 84 per minute and regular, respirations 20 per minute and not  labored, O2 saturations 98% on room air, temperature 98.6.  HEENT exam  is unremarkable.  Neck exam showed no jugular distension, no carotid  bruits, no thyromegaly or lymphadenopathy.  Chest exam was clear to  percussion and auscultation without wheezes, rales or rhonchi heard.  Cardiac exam was negative revealing a regular rhythm, normal S1 and S2  without murmurs, rubs or gallops detected.  The abdomen was soft and  nontender without evidence of organomegaly or masses.  Bowel sounds were  intact.  Extremities showed no cyanosis or clubbing.  Mild venous  insufficiency and trace edema noted.  Neurologic exam was intact without  focal abnormalities detected.  Skin exam was negative.   LABORATORY DATA:  EKG showed normal sinus rhythm and nonspecific ST-T  wave changes.  The QRS was not widened, and the QT was normal.  She had  a 2-D echo which showed overall normal left ventricular systolic  function with the ejection fraction estimated between 55% and 65%.  There were no left ventricular regional wall motion abnormalities  detected.  Left ventricular wall thickness was mildly increased.  There  was moderate mitral annular calcification and mild mitral regurgitation,  mildly dilated left atrium and moderately increased peak pulmonary  artery systolic pressures.  Chest x-ray showed chronic bronchitic  changes, large hiatus hernia noted behind the heart and atelectasis of  the lung bases.  There were no acute abnormalities.  MRI of the brain  showed no acute infarction or intracranial lesions.  There was mild atrophy and moderate small vessel disease.  An  MRA performed at the same  time showed some motion degraded artifact but prominent intracranial  atherosclerotic changes were noted including moderate stenosis of the  right internal carotid artery, anterior cavernous segment, narrowing of  the proximal petrous segment of the right internal carotid, and some  basilar artery narrowing.  An EEG was done showing mild  abnormality  including some intermittent rhythmic delta activity in the frontal  lobes, but no seizure activity detected.  Carotid Dopplers were also  performed, and preliminary report indicated no hemodynamically  significant internal carotid stenoses.  CBC showed a hemoglobin of 12.2,  hematocrit 37.5, white count 15,000 with 74% segs.  Pro time of 13.8,  INR 1.0, PTT 29 seconds.  Sodium 138, potassium 4.1, chloride 103, CO2  of 27, BUN 10, creatinine 0.73, blood sugar 86, bilirubin 1.0, alkaline  phosphatase 105, SGOT 24, SGPT 18, total protein 7.5, albumin 3.6,  calcium 9.1.  Beta natruretic peptide 239.  Sedimentation rate 28.  TSH  1.40.  Serum iron 38.  TIBC 452 for saturation of 8%.  Folic acid level  greater than 20.  B-12 level 613.   HOSPITAL COURSE:  The patient was admitted with a syncopal spell for  cardiac and neurologic workups.  The results of MRI of the brain and EEG  are as noted above.  With the finding of significant intracranial  atherosclerotic disease, Plavix was added to her previous aspirin  therapy.  She was seen in consultation by Dr. Graciela Husbands of the cardiology  service.  He felt that her syncopal spell was statistically most likely  be a bradyarrhythmia with rapid onset and offset.  He noted the  recurrence rate quoted at about 40%.  He felt that an on auto detect  monitor device would be indicated, and he is going to set this up as an  outpatient.  The patient had no spells during this hospital stay.  She  had continuous cardiac monitoring with normal sinus rhythm throughout  and no ectopy.  She  ambulated on the floor and was stable and felt to be  maximum hospital benefit and ready for discharge home on March 11, 2006.   The patient did develop a moderate cough and some congestion.  She had a  low-grade temperature of 100.  She was started on Zithromax and given  Tussionex and Mucinex for the cough and sputum.   DISCHARGE MEDICATIONS:  1. Z-Pak, take as directed until gone.  2. Tussionex 1 teaspoon q.12h. as needed for cough.  3. Mucinex 1-2 tablets p.o. b.i.d. with plenty of fluids.  4. Magic mouth wash 5 cc gargle and swallow q.i.d. as needed.  5. Enteric-coated aspirin 81 mg p.o. daily.  6. Plavix 75 mg p.o. daily.  7. Lescol 1 tablet q.h.s. for cholesterol.  8. Covera HS 180 mg one p.o. daily for blood pressure.  9. Ranitidine 150 mg p.o. daily.  10.Clorazepate 7.5 mg p.o. t.i.d. as needed for nerves.  11.Niferex iron 150 mg p.o. daily.   CONDITION ON DISCHARGE:  Improved.  FOLLOW UP:  The patient will return to the office for a follow-up  evaluation on Tuesday, March 18, 2006 at 1:45 p.m.  She will follow  with Dr. Graciela Husbands for the auto detect monitor as noted.   CONDITION ON DISCHARGE:  Improved take her much      [employer]. Kriste Basque, MD  Electronically Signed     SMN/MEDQ  D:  03/10/2006  T:  03/10/2006  Job:  [id number]

## 2010-07-20 NOTE — Assessment & Plan Note (Signed)
[facility]                         GASTROENTEROLOGY OFFICE NOTE   NAME:Saltos, CLARINDA OBI                         MRN:          [id number]  DATE:06/04/2006                            DOB:          04-Sep-1924    The patient comes in with a history of blood in her stool. She says she  has not seen any blood. Her stools have been darker and she has been put  on some iron. She did have stool checks that showed three stools  positive out of six. Her last colonoscopic examination was in 2006 had  some small polyps, but not of enough significance and some diverticular  changes, but she has never had an upper endoscopic examination.   FAMILY HISTORY:  Negative.   PAST MEDICAL HISTORY:  Reveals that she has had some hyperlipidemia and  hypertension, but otherwise has been in good health.   SOCIAL HISTORY:  She is retired. She has a nursing Associate's degree.   REVIEW OF SYSTEMS:  Non-contributory.   PHYSICAL EXAMINATION:  VITAL SIGNS:  She is 5 feet 2 inches, weight 172,  blood pressure 146/76, pulse 82 and regular.  HEENT:  Oral pharynx is negative.  NECK:  Negative.  CHEST:  Clear.  HEART:  Regular rate and rhythm.  ABDOMEN:  No masses or organomegaly, nontender.   IMPRESSION:  1. Anemia; iron deficiency.  2. Positive stools.  3. History of GERD.  4. Status post colon polyps and diverticular disease.  5. Arthrosclerotic coronary vascular disease.  6. Hypertension.  7. Hyperlipidemia.  8. History of degenerative arthritis.  9. History of anxiety on Clorazepate.  10.Atherosclerosis, peripheral vascular disease with an abnormal MRI      of the brain showing some mild atrophy and some small vessel      disease.  11.Asthmatic bronchitis.  12.Recent syncopal episode.   RECOMMENDATIONS:  Schedule her for an upper endoscopy. I do not feel  that colonoscopy is indicated at this time, because it was very recently  done. In the meantime, I could  continue  her on her medications which includes Ranitidine 150 mg daily, Niferex,  Plavix, baby aspirin, Clorazepate, Lescol, and Covera-HS.     Ulyess Mort, MD  Electronically Signed    SML/MedQ  DD: 06/04/2006  DT: 06/05/2006  Job #: [id number]   cc:   Lonzo Cloud. Kriste Basque, MD

## 2010-07-23 ENCOUNTER — Encounter (HOSPITAL_COMMUNITY): Payer: Medicare Other

## 2010-07-23 ENCOUNTER — Telehealth: Payer: Self-pay | Admitting: Pulmonary Disease

## 2010-07-23 NOTE — Telephone Encounter (Signed)
[id number] x1  Put hold on sn schedule for tomorrow at 11:00

## 2010-07-23 NOTE — Telephone Encounter (Signed)
Called and spoke with pt.  Ok'd per Leigh to use 08/01/2010 at 10:30 am.  Pt was ok with this appt date and time.

## 2010-07-24 NOTE — Telephone Encounter (Signed)
This is a duplicate phone note.  Appt has already been made for pt on 5/30 at 10:30 am

## 2010-07-25 ENCOUNTER — Encounter (HOSPITAL_COMMUNITY): Payer: Medicare Other

## 2010-07-25 NOTE — Discharge Summary (Signed)
NAME:  Virginia Casey, Virginia Casey                  ACCOUNT NO.:  [id number]  MEDICAL RECORD NO.:  [id number]           PATIENT TYPE:  I  LOCATION:  1410                         FACILITY:  [facility]  PHYSICIAN:  Lonzo Cloud. Kriste Basque, MD     DATE OF BIRTH:  [birth year]  DATE OF ADMISSION:  07/16/2010 DATE OF DISCHARGE:  07/19/2010                              DISCHARGE SUMMARY   FINAL DIAGNOSES:  1:  Admitted Jul 16, 2010, via the office with refractory asthmatic bronchitis.  The patient responded to intravenous and inhaled medication in the hospital and is discharged on a prednisone Dosepak and Mucinex for office followup. 1. Hypertension.  ACE inhibitor stopped this admission and blood     pressure regimen switched to amlodipine and losartan. 2. Arteriosclerotic heart disease followed by Dr. Sharyn Lull. 3. Arteriosclerotic peripheral vascular disease. 4. History of paroxysmal atrial flutter. 5. Hypercholesterolemia on Crestor. 6. Hiatal hernia on Pepcid. 7. History of diverticulosis. 8. History of colon polyps. 9. Degenerative arthritis. 10.Vitamin D deficiency. 11.Anemia on iron and vitamin C. 12.Anxiety. 13.Hyperthyroidism with abnormal thyroid function tests on amiodarone,     evaluation by Dr. Lucianne Muss, treated with propylthiouracil 50 mg per     oral three times daily.  BRIEF HISTORY AND PHYSICAL:  The patient is an 77 year old black female known to [place] from previous office visits.  She is followed by Dr. Sharyn Lull for her cardiovascular problems and by Dr. Lucianne Muss for hyperthyroidism. She presented to the office on Jul 16, 2010, with refractory asthmatic bronchitis and weakness and she did not think that she could make it on her own at home.  She noted a 1-week history of cough and chest congestion with wheezing.  She was taking cough syrup at home, but noticed increased congestion, rattling cough and progressive weakness as noted.  She was short of breath as well.  She denied sputum production, fever,  chills or sweats.  She denied any hemoptysis or chest pain.  She did not feel that she could go home and therefore was admitted for in- hospital therapy.  As noted, she has a history of asthmatic bronchitis in the past and a known large hiatal hernia, but she denied any aspiration symptoms.  She has hypertension and has been on an ACE inhibitor from Dr. Sharyn Lull.  It was felt that this may be playing a role and the ACE inhibitor will be switched during this hospitalization.  She has a history of arteriosclerotic heart disease with paroxysmal atrial flutter for which she takes amiodarone under Dr. Annitta Jersey supervision and arteriosclerotic peripheral vascular disease.  She has hypercholesterolemia controlled on Crestor.  As noted, she has a large hiatal hernia and takes Pepcid.  She has a history of diverticulosis and colon polyps.  She has degenerative arthritis and has had a moderate amount of right hip pain.  She has vitamin D deficiency and takes vitamin D supplements.  She has a history of anemia for which she takes iron and vitamin C.  She has moderate anxiety.  She was diagnosed last February with hyperthyroidism and, as noted, is on amiodarone.  She was referred  to Dr. Lucianne Muss who placed her on anti-thyroid drugs, initially methimazole and then switched to propylthiouracil.  She is currently taking PTU 50 mg p.o. t.i.d.  PHYSICAL EXAMINATION:  Physical examination at the time of admission revealed an elderly black female in no acute distress.  Blood pressure 144/90, pulse 60 per minute and irregular, respirations 18 per minute and not labored, temperature 98 degrees.  HEENT examination unremarkable.  Neck examination showed no jugular venous distention, no carotid bruits, no thyromegaly or lymphadenopathy.  Chest examination revealed coarse breath sounds with expiratory wheezing and scattered rhonchi.  No rales or signs of consolidation.  Cardiac examination revealed a slightly  irregular rhythm, grade 1/6 systolic ejection murmur in the left sternal border.  No rubs or gallops heard.  The abdomen was soft and nontender without evidence of organomegaly or masses. Extremities showed no cyanosis, clubbing or edema.  Neurologic examination was intact without focal abnormalities detected.  Skin was warm and dry with no lesions detected.  LABORATORY DATA:  Chest x-ray showed moderate hiatal hernia, unchanged in appearance from old films.  There was stable cardiomegaly and some bibasilar atelectasis, osteopenia and kyphosis noted.  No acute abnormalities detected.  EKG showed a normal sinus rhythm and nonspecific ST-T wave changes.  CBC showed a hemoglobin of 12.1, hematocrit 36.8, white count 7000 with a normal differential.  Chemistry studies were normal with a blood sugar 114, BUN 15, creatinine 0.95. Liver enzymes and proteins were normal.  Sedimentation rate 24.  TSH 0.84 with free T3 2.0 and free T4 1.40.  HOSPITAL COURSE:  The patient was admitted with refractory asthmatic bronchitis, chest congestion and wheezing.  She did not feel that she could make it on her own at home.  She was placed on IV Solu-Medrol andIV Avelox, given low-flow oxygen and nebulizer treatments with Xopenex. She responded nicely to this regimen.  We were able to wean the IV Avelox in favor of the oral tablets.  We were able to slowly wean the IV Solu-Medrol in favor of oral prednisone.  She continued on IV nebulizer treatments with the Xopenex.  We added a mucolytic agent in the form of Mucinex 1200 mg p.o. b.i.d. with plenty of fluids.  As noted, she responded nicely and was felt to be MHB and ready for discharge on Jul 19, 2010.  Her hiatal hernia appeared to be well- regulated with acid suppressors and an anti-reflux regimen.  We were concerned about her ACE inhibitor and its effect on her respiratory status and we stopped her ramipril in favor of losartan.  Her blood pressure  tended to be high in the hospital in the 150/90 range and low- dose amlodipine 5 mg a day was added to her regimen with good control at that point.  Her thyroid disease was well-maintained on the PTU 50 mg p.o. t.i.d. under the direction of Dr. Lucianne Muss.  MEDICATIONS AT DISCHARGE: 1. Prednisone Dosepak - take as directed till gone. 2. Mucinex 600 mg tablets two tablets p.o. b.i.d. with plenty of     fluids. 3. Hydromet cough syrup one teaspoon every 4 hours as needed for     cough. 4. Aspirin 81 mg p.o. daily. 5. Amiodarone 200 mg tablet one-half tablet daily. 6. Crestor 20 mg p.o. daily. 7. Pepcid 20 mg p.o. b.i.d. 8. Iron sulfate 325 mg p.o. daily. 9. Vitamin C 500 mg p.o. daily. 10.Vitamin D 1000 units p.o. daily. 11.PTU (propylthiouracil) 50 mg p.o. t.i.d.  CONDITION ON DISCHARGE:  Improved.  DISPOSITION:  The patient is being discharged home on the above medications.  FOLLOWUP:  She will follow up with me in the office in one week and follow up with Dr. Sharyn Lull and Dr. Lucianne Muss as scheduled.     Lonzo Cloud. Kriste Basque, MD     SMN/MEDQ  D:  07/19/2010  T:  07/19/2010  Job:  [id number]  Electronically Signed by Alroy Dust MD on 07/25/2010 01:38:18 PM

## 2010-07-27 ENCOUNTER — Encounter (HOSPITAL_COMMUNITY): Payer: Medicare Other

## 2010-07-30 ENCOUNTER — Encounter (HOSPITAL_COMMUNITY): Payer: Medicare Other

## 2010-08-01 ENCOUNTER — Encounter (HOSPITAL_COMMUNITY): Payer: Medicare Other

## 2010-08-01 ENCOUNTER — Ambulatory Visit (INDEPENDENT_AMBULATORY_CARE_PROVIDER_SITE_OTHER): Payer: Medicare Other | Admitting: Pulmonary Disease

## 2010-08-01 ENCOUNTER — Encounter: Payer: Self-pay | Admitting: Pulmonary Disease

## 2010-08-01 DIAGNOSIS — I4892 Unspecified atrial flutter: Secondary | ICD-10-CM

## 2010-08-01 DIAGNOSIS — J45909 Unspecified asthma, uncomplicated: Secondary | ICD-10-CM

## 2010-08-01 DIAGNOSIS — I2510 Atherosclerotic heart disease of native coronary artery without angina pectoris: Secondary | ICD-10-CM

## 2010-08-01 DIAGNOSIS — I739 Peripheral vascular disease, unspecified: Secondary | ICD-10-CM

## 2010-08-01 DIAGNOSIS — E079 Disorder of thyroid, unspecified: Secondary | ICD-10-CM

## 2010-08-01 DIAGNOSIS — K5730 Diverticulosis of large intestine without perforation or abscess without bleeding: Secondary | ICD-10-CM

## 2010-08-01 DIAGNOSIS — M1990 Unspecified osteoarthritis, unspecified site: Secondary | ICD-10-CM

## 2010-08-01 DIAGNOSIS — F411 Generalized anxiety disorder: Secondary | ICD-10-CM

## 2010-08-01 DIAGNOSIS — K449 Diaphragmatic hernia without obstruction or gangrene: Secondary | ICD-10-CM

## 2010-08-01 DIAGNOSIS — D649 Anemia, unspecified: Secondary | ICD-10-CM

## 2010-08-01 DIAGNOSIS — E7800 Pure hypercholesterolemia, unspecified: Secondary | ICD-10-CM

## 2010-08-01 DIAGNOSIS — I10 Essential (primary) hypertension: Secondary | ICD-10-CM

## 2010-08-01 MED ORDER — ALPRAZOLAM 0.25 MG PO TABS: ORAL_TABLET | ORAL | Status: DC

## 2010-08-01 NOTE — Progress Notes (Signed)
Subjective:    Patient ID: Virginia Casey, female    DOB: 1924/08/07, 77 y.o.   MRN: [id number]  HPI 77 y/o BF here for a follow up visit... she has mult med problems as noted below...   ~  October 19, 2009:  she was hosp 7/29 - 10/04/09 by Maia Breslow for CP w/ Myoview showing ant ischemia, norm wall motion & EF= 82%;  cath showed 70% midLAD in-stent restenosis, otherw non-obstructive dis seen in other vessels;  subseq PTCA was successful & she was placed on PLAVIX x46mo (plus her ASA)...  her DC meds were changed to ASA/ PLAVIX, RAMIPRIL, IMDUR, AMIO, CRESTOR, PERCOCET...  we reviewed hosp records> CXR, Labs, etc (see below)...  Since hosp she tells me she stopped the Isosorbide on her own due to HA & "makes me sick";  since then her BP is sl higher ~150/90, & she has a call into DrHarwani to see what to do...  ~  November 24, 2009: S add-on visit for cough x several days> states onset URI w/ cough, yellow sputum, low grade fever, congestion, and bilat rib pain from the cough... no hemoptysis, ch in dyspnea, angina CP, etc...  taking Tylenol, fluids, Robitussin... we discussed checking CXR & Rx w/ Depo80, Pred Dosepak, Augmentin, Mucinex, & Tramadol...  ~  April 18, 2010:    She reports a good 6 months- no new complaints or concerns;  Maia Breslow has her in Cardiac Rehab & she really like it- walking, biking, etc & no CP/ palpit/ SOB/ edema/ etc...    BP controlled & as prev noted DrHarwani makes freq changes in her meds & we don't have notes from his office (she reports off Imdur, off Plavix, on Amio 200mg - 1/2 daily);  Chol looks fabulous on Cres20 & she may indeed be able to decr to 10mg  dose (copy labs to [facility]);  GI stable on Pepcid, & as noted she is off the Plavix now);  her Hg is stable on the Fe & VitC;  only surprize finding is low TSH at 0.03 & we will contact pt to ret for further eval (Hyperthyroidism confirmed & she is on Amio> referred to [facility]- treated w/ PTU 50mg  Tid, we don't have  his notes to review)...   ~  Aug 01, 2010:  37mo ROV & post-hosp check> she was ADM 5/14 - 07/19/10 w/ refractory AB & she responded to IV & inhaled Rx being disch on Pred Dosepak, Mucinex, Hydromet;  She reports feeling much better, finished the Pred, & advised to use the Mucinex & Hydromet as needed...  She has already been back to see [facility] & she is off the ACE now on Losartan & Amlodipine for her BP & ASHD... [name] her hosp XRays and Labs...  Problem List:    ASTHMATIC BRONCHITIS (ICD-466.0) - breathing is stable now- s/p recent exacerbation... ~  CXR 1/12 showed basilar atx & large HH, NAD... ~  [facility] 5/12 w/ refractory AB exac & improved w/ standard therapy & brief course of Pred...  HYPERTENSION (ICD-401.9) - now on LOSARTAN 100mg /d & AMLODIPINE 5mg /d (and off her prev Ramipril due to asthma & Imdur due to HA) ... BP= 110/72 & not checking BP's at home... she denies visual changes, CP, palipit, syncope, dyspnea, edema, etc...  ATHEROSCLEROTIC HEART DISEASE (ICD-414.00) & PERIPHERAL VASCULAR DISEASE (ICD-443.9) - regularly on ASA 81mg /d, & off Plavix... followed by [facility] for Cards (we do not have his notes to review med changes)>> ~  hosp in  EAV40 w/ syncope and MRI Brain showed mild atrophy & sm vessel dis;  MRA showed basilar art narrowing & mod stenosis in RICA cavernous segment... CDopplers however were WNL w/ antegrade vertebral flow, and no signif ICA stenoses detected... ~  hosp Feb10 by [facility] w/ non-STEMI- s/p PTCA and stent in the LAD. ~  hosp 7/11 by [facility] w/ CP, Abn Myoview & cath w/ 70% midLAD in-stent resteosis; s/p PTCA & started back on Plavix x 25mo. ~  she is in Cardiac Rehab & really likes it...  ATRIAL FLUTTER, PAROXYSMAL (ICD-427.32) - this occured 2/10 following her cath, PTCA, stent... he diagnosed flutter w/ 2:1 block... currently taking AMIODARONE 200mg - 1/2 tab daily...  HYPERCHOLESTEROLEMIA (ICD-272.0) - on CRESTOR 20mg /d... weight is 179#.[name]. ~   FLP 11/08 showed TChol 161, TG 39, HDL 62, LDL 92... continue same med. ~  FLP 5/09 showed TChol 209, TG 70, HDL 74, LDL 124... rec- better diet, get weight down! ~  FLP in hosp 2/10 showed TChol 130, TG 69, HDL 45, LDL 71 ~  she reports that [facility] does fasting labs... ~  FLP 7/11 in hosp showed TChol 105, TG 30, HDL 66, LDL 33 ~  FLP 2/12 on Cres20 showed TChol 97, TG 38, HDL 50, LDL 39... copy to Cards ?decr to 10mg ?  HIATAL HERNIA (ICD-553.3) - prev on Nexium & Carafate Liq- but changed to PEPCID 20mg Bid by [facility] due to need for Plavix... she has a giant HH measuring 10cm per DrSam, w/ erosions in the pouch... last EGD by DrSam 4/08... hernia is seen on her CXRs.  DIVERTICULOSIS OF COLON (ICD-562.10) & COLONIC POLYPS (ICD-211.3) ~  colonoscopy 11/06 showed divertics and 2- 4mm polyps... f/u planned in 4-34yrs... ~  [facility] 11/10 w/ lower GIB believed due to divertics... colonoscopy by DrJacobs showed severe divertics, no bleeding site, 7mm polyp removed...  DEGENERATIVE JOINT DISEASE (ICD-715.90) & HIP PAIN, RIGHT (ICD-719.45) - currently taking OTC meds only but DrDean rec Vicodin for as needed use (she prefers Tylenol)... Eval by DrDean 3/09 w/ bone-on-bone right hip, tried injection & pain meds... right THR done 12/09...  VITAMIN D DEFICIENCY (ICD-268.9) ~  labs 5/09 showed Vit D level = 9... pt started on 50K weekly but she switched on her own to 1000u/d OTC. ~  labs 2/12 showed Vit D level = 18... asked to incr Vit D supplement to 2000 u daily.  ANXIETY (ICD-300.00) - she is requesting a mild nerve pill & we have written ALPRAZOLAM 0.25mg  Prn use...  ANEMIA (ICD-285.9) - prev on Niferex 150mg /d, now FeSO4 325mg /d w/ Vit C 500mg ... 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 ~  last blood count 11/08 showed Hg=13.7, Fe=56... ~  labs 5/09 showed Hg= 14.2.[name]. ~  labs 2/10 in hosp showed Hg= 13 ~  [facility] 11/10 w/ lower GIB, & Hg down to 8.6- tc 1u PC up to 9.3.Marland KitchenMarland Kitchen ~  labs 12/10  showed Hg= 10.6.[name]. rec> continue oral Fe w/ VitC... ~  [facility] labs 7/11 showed Hg= 11.6 - 10.5, MCV= 70... rec> restart Fe. ~  labs 2/12 showed Hg= 12.4, MCV= 77, Fe= 56 (sat=10%)... continue Fe w/ VitC. ~  Labs 5/12 in hosp showed Hg= 12.3, MCV= 68, & she continues on FeSO4 daily w/ VitC...  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   Past Surgical History  Procedure Date  . Total hip arthroplasty 12/09  Dr August Saucer  . Coronary angioplasty with stent placement 2/10    Dr Sharyn Lull  . Coronary angioplasty with stent placement 7/11    Dr Sharyn Lull    Outpatient Encounter Prescriptions as of 08/01/2010  Medication Sig Dispense Refill  . acetaminophen (TYLENOL) 325 MG tablet Take 650 mg by mouth every 6 (six) hours as needed.        [name] amiodarone (PACERONE) 200 MG tablet Take 1/2 tab by mouth once daily       . amLODipine (NORVASC) 5 MG tablet Take 5 mg by mouth daily.        [name] aspirin 81 MG tablet Take 81 mg by mouth daily.        . Cholecalciferol (VITAMIN D) 1000 UNITS capsule Take 1,000 Units by mouth daily.        . famotidine (PEPCID) 20 MG tablet Take 1 tablet by mouth Twice daily.      . ferrous gluconate (FERGON) 325 MG tablet Take 325 mg by mouth daily with breakfast. With vitamin C tablet       . HYDROcodone-homatropine (HYDROMET) 5-1.5 MG/5ML syrup Take 1 tsp by mouth every 6 hours as needed for cough  180 mL  1  . losartan (COZAAR) 100 MG tablet Take 100 mg by mouth daily.        . nitroGLYCERIN (NITROSTAT) 0.4 MG SL tablet Place 0.4 mg under the tongue every 5 (five) minutes as needed. May repeat x3       . rosuvastatin (CRESTOR) 20 MG tablet Take 20 mg by mouth daily.        . vitamin C (ASCORBIC ACID) 500 MG tablet Take 500 mg by mouth daily. With iron tablet       . DISCONTD: ramipril (ALTACE) 5 MG capsule Take 2 capsules by mouth every morning and 2 by mouth at bedtime        No Known Allergies   Review of  Systems         See HPI - all other systems neg except as noted... The patient complains of dyspnea on exertion.  The patient denies anorexia, fever, weight loss, weight gain, vision loss, decreased hearing, hoarseness, chest pain, syncope, peripheral edema, prolonged cough, headaches, hemoptysis, abdominal pain, melena, hematochezia, severe indigestion/heartburn, hematuria, incontinence, muscle weakness, suspicious skin lesions, transient blindness, difficulty walking, depression, unusual weight change, abnormal bleeding, enlarged lymph nodes, and angioedema.    Objective:   Physical Exam     WD, WN, 77 y/o BF in NAD... GENERAL:  Alert & oriented; pleasant & cooperative... HEENT:  [place]/AT, EOM-wnl, EACs-clear, TMs-wnl, NOSE-clear, THROAT-clear & wnl. NECK:  Supple w/ fairROM; no JVD; normal carotid impulses w/o bruits; no thyromegaly or nodules palpated; no lymphadenopathy. CHEST:  Coarse BS w/ no wheezing, no rales, no signs of consolidation. HEART:  Regular Rhythm; gr 1/6 SEM without rubs or gallops detected... ABDOMEN:  Soft & nontender; normal bowel sounds; no organomegaly or masses palpated... EXT: without deformities, mod arthritic changes; s/p right THR; no varicose veins/ +venous insuffic/  tr edema . BACK:  sl tender along right PSIS area... NEURO:  CN's intact; no focal neuro defict... DERM:  no lesions seen...   Assessment & Plan:   AB>  Improved now after in hosp Rx & she has finished the Dosepak...  HBP>  Well controlled on the Losartan & Amlodipine; asked to monitor BP at home...  ASHD>  Followed by Maia Breslow...  AFlutter> on Amio per [facility] & she will continue follow up  w/ him...  CHOL>  On Cres20 w/ good control of FLP...  GI>  Hx HH/ GERD/ Divertics/ Polyps>  She is followed by DrJacobs for GI, continue H2blocker Rx...  DJD>  She is followed by DrDean for Ortho...  Anemia>  Hg is OK but MCV is low, we will f/u Fe on ret visit...  Anxiety>  Requesting a mild  nerve pill & we wrote for Alprazolam..[name]

## 2010-08-01 NOTE — Patient Instructions (Signed)
Today we updated your med list in EPIC...    We wrote a new prescription for Alprazolam, a mild nerve pill, to try 1/2 to 1 tab up to 3 times daily...  Continue your other meds the same...  Call for any problems...  Let's plan a follow up visit in 3 months.Marland KitchenMarland Kitchen

## 2010-08-03 ENCOUNTER — Encounter (HOSPITAL_COMMUNITY): Payer: Medicare Other | Attending: Cardiology

## 2010-08-03 DIAGNOSIS — I2510 Atherosclerotic heart disease of native coronary artery without angina pectoris: Secondary | ICD-10-CM | POA: Insufficient documentation

## 2010-08-03 DIAGNOSIS — Z5189 Encounter for other specified aftercare: Secondary | ICD-10-CM | POA: Insufficient documentation

## 2010-08-03 DIAGNOSIS — K219 Gastro-esophageal reflux disease without esophagitis: Secondary | ICD-10-CM | POA: Insufficient documentation

## 2010-08-03 DIAGNOSIS — I252 Old myocardial infarction: Secondary | ICD-10-CM | POA: Insufficient documentation

## 2010-08-03 DIAGNOSIS — Z7982 Long term (current) use of aspirin: Secondary | ICD-10-CM | POA: Insufficient documentation

## 2010-08-03 DIAGNOSIS — I209 Angina pectoris, unspecified: Secondary | ICD-10-CM | POA: Insufficient documentation

## 2010-08-03 DIAGNOSIS — Z9861 Coronary angioplasty status: Secondary | ICD-10-CM | POA: Insufficient documentation

## 2010-08-03 DIAGNOSIS — Z7902 Long term (current) use of antithrombotics/antiplatelets: Secondary | ICD-10-CM | POA: Insufficient documentation

## 2010-08-03 DIAGNOSIS — I4891 Unspecified atrial fibrillation: Secondary | ICD-10-CM | POA: Insufficient documentation

## 2010-08-03 DIAGNOSIS — M1990 Unspecified osteoarthritis, unspecified site: Secondary | ICD-10-CM | POA: Insufficient documentation

## 2010-08-03 DIAGNOSIS — I10 Essential (primary) hypertension: Secondary | ICD-10-CM | POA: Insufficient documentation

## 2010-08-03 DIAGNOSIS — Z79899 Other long term (current) drug therapy: Secondary | ICD-10-CM | POA: Insufficient documentation

## 2010-08-03 DIAGNOSIS — E7800 Pure hypercholesterolemia, unspecified: Secondary | ICD-10-CM | POA: Insufficient documentation

## 2010-08-04 ENCOUNTER — Encounter: Payer: Self-pay | Admitting: Pulmonary Disease

## 2010-08-06 ENCOUNTER — Encounter (HOSPITAL_COMMUNITY): Payer: Medicare Other

## 2010-08-08 ENCOUNTER — Encounter (HOSPITAL_COMMUNITY): Payer: Medicare Other

## 2010-08-10 ENCOUNTER — Encounter (HOSPITAL_COMMUNITY): Payer: Medicare Other

## 2010-08-13 ENCOUNTER — Encounter (HOSPITAL_COMMUNITY): Payer: Medicare Other

## 2010-08-15 ENCOUNTER — Encounter (HOSPITAL_COMMUNITY): Payer: Medicare Other

## 2010-08-17 ENCOUNTER — Encounter (HOSPITAL_COMMUNITY): Payer: Medicare Other

## 2010-08-20 ENCOUNTER — Encounter (HOSPITAL_COMMUNITY): Payer: Medicare Other

## 2010-08-22 ENCOUNTER — Encounter (HOSPITAL_COMMUNITY): Payer: Medicare Other

## 2010-08-24 ENCOUNTER — Encounter (HOSPITAL_COMMUNITY): Payer: Medicare Other

## 2010-08-27 ENCOUNTER — Encounter (HOSPITAL_COMMUNITY): Payer: Medicare Other

## 2010-08-29 ENCOUNTER — Encounter (HOSPITAL_COMMUNITY): Payer: Medicare Other

## 2010-08-31 ENCOUNTER — Encounter (HOSPITAL_COMMUNITY): Payer: Medicare Other

## 2010-09-03 ENCOUNTER — Encounter (HOSPITAL_COMMUNITY): Payer: Medicare Other | Attending: Cardiology

## 2010-09-03 DIAGNOSIS — I10 Essential (primary) hypertension: Secondary | ICD-10-CM | POA: Insufficient documentation

## 2010-09-03 DIAGNOSIS — I209 Angina pectoris, unspecified: Secondary | ICD-10-CM | POA: Insufficient documentation

## 2010-09-03 DIAGNOSIS — M1990 Unspecified osteoarthritis, unspecified site: Secondary | ICD-10-CM | POA: Insufficient documentation

## 2010-09-03 DIAGNOSIS — Z7982 Long term (current) use of aspirin: Secondary | ICD-10-CM | POA: Insufficient documentation

## 2010-09-03 DIAGNOSIS — E7800 Pure hypercholesterolemia, unspecified: Secondary | ICD-10-CM | POA: Insufficient documentation

## 2010-09-03 DIAGNOSIS — Z79899 Other long term (current) drug therapy: Secondary | ICD-10-CM | POA: Insufficient documentation

## 2010-09-03 DIAGNOSIS — Z7902 Long term (current) use of antithrombotics/antiplatelets: Secondary | ICD-10-CM | POA: Insufficient documentation

## 2010-09-03 DIAGNOSIS — I252 Old myocardial infarction: Secondary | ICD-10-CM | POA: Insufficient documentation

## 2010-09-03 DIAGNOSIS — Z5189 Encounter for other specified aftercare: Secondary | ICD-10-CM | POA: Insufficient documentation

## 2010-09-03 DIAGNOSIS — Z9861 Coronary angioplasty status: Secondary | ICD-10-CM | POA: Insufficient documentation

## 2010-09-03 DIAGNOSIS — I4891 Unspecified atrial fibrillation: Secondary | ICD-10-CM | POA: Insufficient documentation

## 2010-09-03 DIAGNOSIS — K219 Gastro-esophageal reflux disease without esophagitis: Secondary | ICD-10-CM | POA: Insufficient documentation

## 2010-09-03 DIAGNOSIS — I2510 Atherosclerotic heart disease of native coronary artery without angina pectoris: Secondary | ICD-10-CM | POA: Insufficient documentation

## 2010-09-05 ENCOUNTER — Encounter (HOSPITAL_COMMUNITY): Payer: Medicare Other

## 2010-09-07 ENCOUNTER — Encounter (HOSPITAL_COMMUNITY): Payer: Medicare Other

## 2010-09-10 ENCOUNTER — Encounter (HOSPITAL_COMMUNITY): Payer: Medicare Other

## 2010-09-12 ENCOUNTER — Encounter (HOSPITAL_COMMUNITY): Payer: Medicare Other

## 2010-09-14 ENCOUNTER — Encounter (HOSPITAL_COMMUNITY): Payer: Medicare Other

## 2010-09-17 ENCOUNTER — Encounter (HOSPITAL_COMMUNITY): Payer: Medicare Other

## 2010-09-19 ENCOUNTER — Encounter (HOSPITAL_COMMUNITY): Payer: Medicare Other

## 2010-09-21 ENCOUNTER — Encounter (HOSPITAL_COMMUNITY): Payer: Medicare Other

## 2010-09-24 ENCOUNTER — Encounter (HOSPITAL_COMMUNITY): Payer: Medicare Other

## 2010-09-26 ENCOUNTER — Encounter (HOSPITAL_COMMUNITY): Payer: Medicare Other

## 2010-09-28 ENCOUNTER — Encounter (HOSPITAL_COMMUNITY): Payer: Medicare Other

## 2010-10-01 ENCOUNTER — Encounter (HOSPITAL_COMMUNITY): Payer: Medicare Other

## 2010-10-03 ENCOUNTER — Encounter (HOSPITAL_COMMUNITY): Payer: Self-pay | Attending: Cardiology

## 2010-10-03 DIAGNOSIS — I209 Angina pectoris, unspecified: Secondary | ICD-10-CM | POA: Insufficient documentation

## 2010-10-03 DIAGNOSIS — Z9861 Coronary angioplasty status: Secondary | ICD-10-CM | POA: Insufficient documentation

## 2010-10-03 DIAGNOSIS — I4891 Unspecified atrial fibrillation: Secondary | ICD-10-CM | POA: Insufficient documentation

## 2010-10-03 DIAGNOSIS — E7800 Pure hypercholesterolemia, unspecified: Secondary | ICD-10-CM | POA: Insufficient documentation

## 2010-10-03 DIAGNOSIS — Z7982 Long term (current) use of aspirin: Secondary | ICD-10-CM | POA: Insufficient documentation

## 2010-10-03 DIAGNOSIS — Z79899 Other long term (current) drug therapy: Secondary | ICD-10-CM | POA: Insufficient documentation

## 2010-10-03 DIAGNOSIS — Z5189 Encounter for other specified aftercare: Secondary | ICD-10-CM | POA: Insufficient documentation

## 2010-10-03 DIAGNOSIS — I10 Essential (primary) hypertension: Secondary | ICD-10-CM | POA: Insufficient documentation

## 2010-10-03 DIAGNOSIS — I252 Old myocardial infarction: Secondary | ICD-10-CM | POA: Insufficient documentation

## 2010-10-03 DIAGNOSIS — K219 Gastro-esophageal reflux disease without esophagitis: Secondary | ICD-10-CM | POA: Insufficient documentation

## 2010-10-03 DIAGNOSIS — I2510 Atherosclerotic heart disease of native coronary artery without angina pectoris: Secondary | ICD-10-CM | POA: Insufficient documentation

## 2010-10-03 DIAGNOSIS — M1990 Unspecified osteoarthritis, unspecified site: Secondary | ICD-10-CM | POA: Insufficient documentation

## 2010-10-03 DIAGNOSIS — Z7902 Long term (current) use of antithrombotics/antiplatelets: Secondary | ICD-10-CM | POA: Insufficient documentation

## 2010-10-05 ENCOUNTER — Encounter (HOSPITAL_COMMUNITY): Payer: Self-pay

## 2010-10-08 ENCOUNTER — Encounter (HOSPITAL_COMMUNITY): Payer: Self-pay

## 2010-10-10 ENCOUNTER — Encounter (HOSPITAL_COMMUNITY): Payer: Self-pay

## 2010-10-12 ENCOUNTER — Encounter (HOSPITAL_COMMUNITY): Payer: Self-pay

## 2010-10-15 ENCOUNTER — Encounter (HOSPITAL_COMMUNITY): Payer: Self-pay

## 2010-10-17 ENCOUNTER — Encounter: Payer: Self-pay | Admitting: Pulmonary Disease

## 2010-10-17 ENCOUNTER — Encounter (HOSPITAL_COMMUNITY): Payer: Self-pay

## 2010-10-17 ENCOUNTER — Ambulatory Visit (INDEPENDENT_AMBULATORY_CARE_PROVIDER_SITE_OTHER): Payer: Medicare Other | Admitting: Pulmonary Disease

## 2010-10-17 DIAGNOSIS — I4892 Unspecified atrial flutter: Secondary | ICD-10-CM

## 2010-10-17 DIAGNOSIS — I2510 Atherosclerotic heart disease of native coronary artery without angina pectoris: Secondary | ICD-10-CM

## 2010-10-17 DIAGNOSIS — M1990 Unspecified osteoarthritis, unspecified site: Secondary | ICD-10-CM

## 2010-10-17 DIAGNOSIS — D126 Benign neoplasm of colon, unspecified: Secondary | ICD-10-CM

## 2010-10-17 DIAGNOSIS — D649 Anemia, unspecified: Secondary | ICD-10-CM

## 2010-10-17 DIAGNOSIS — K5730 Diverticulosis of large intestine without perforation or abscess without bleeding: Secondary | ICD-10-CM

## 2010-10-17 DIAGNOSIS — I10 Essential (primary) hypertension: Secondary | ICD-10-CM

## 2010-10-17 DIAGNOSIS — E7800 Pure hypercholesterolemia, unspecified: Secondary | ICD-10-CM

## 2010-10-17 DIAGNOSIS — F411 Generalized anxiety disorder: Secondary | ICD-10-CM

## 2010-10-17 DIAGNOSIS — K449 Diaphragmatic hernia without obstruction or gangrene: Secondary | ICD-10-CM

## 2010-10-17 DIAGNOSIS — J45909 Unspecified asthma, uncomplicated: Secondary | ICD-10-CM

## 2010-10-17 NOTE — Patient Instructions (Signed)
Today we updated your med list in EPIC...  Continue your current meds the same...  Call for any prob w/ your breathing...  Let's plan a follow up visit in 6 months, sooner if needed for problems.Marland KitchenMarland Kitchen

## 2010-10-18 ENCOUNTER — Encounter: Payer: Self-pay | Admitting: Pulmonary Disease

## 2010-10-18 NOTE — Progress Notes (Signed)
Subjective:    Patient ID: Virginia Casey, female    DOB: November 05, 1924, 77 y.o.   MRN: [id number]  HPI 77 y/o BF here for a follow up visit... she has mult med problems as noted below...   ~  October 19, 2009:  she was hosp 7/29 - 10/04/09 by Maia Breslow for CP w/ Myoview showing ant ischemia, norm wall motion & EF= 82%;  cath showed 70% midLAD in-stent restenosis, otherw non-obstructive dis seen in other vessels;  subseq PTCA was successful & she was placed on PLAVIX x37mo (plus her ASA)...  her DC meds were changed to ASA/ PLAVIX, RAMIPRIL, IMDUR, AMIO, CRESTOR, PERCOCET...  we reviewed hosp records> CXR, Labs, etc (see below)...  Since hosp she tells me she stopped the Isosorbide on her own due to HA & "makes me sick";  since then her BP is sl higher ~150/90, & she has a call into DrHarwani's office...  ~  November 24, 2009:  add-on visit for cough x several days> states onset URI w/ cough, yellow sputum, low grade fever, congestion, and bilat rib pain from the cough... no hemoptysis, ch in dyspnea, angina CP, etc...  taking Tylenol, fluids, Robitussin... we discussed checking CXR (cardiomeg, HH, osteopenia, & mild basilar atx) & Rx w/ Depo80, Pred Dosepak, Augmentin, Mucinex, & Tramadol...  ~  April 18, 2010:    She reports a good 6 months- no new complaints or concerns;  Maia Breslow has her in Cardiac Rehab & she really like it- walking, biking, etc & no CP/ palpit/ SOB/ edema/ etc...    BP controlled & as prev noted DrHarwani makes freq changes in her meds & we don't have notes from his office (she reports off Imdur, off Plavix, on Amio 200mg - 1/2 daily);  Chol looks fabulous on Cres20 & she may indeed be able to decr to 10mg  dose (copy labs to [facility]);  GI stable on Pepcid, & as noted she is off the Plavix now);  her Hg is stable on the Fe & VitC;  only surprize finding is low TSH at 0.03 & we will contact pt to ret for further eval (Hyperthyroidism confirmed & she is on Amio> referred to [facility]-  treated w/ PTU 50mg  Tid, we don't have his notes to review)...   ~  Aug 01, 2010:  19mo ROV & post-hosp check> she was ADM 5/14 - 07/19/10 w/ refractory AB & she responded to IV & inhaled Rx being disch on Pred Dosepak, Mucinex, Hydromet;  She reports feeling much better, finished the Pred, & advised to use the Mucinex & Hydromet as needed...  She has already been back to see [facility] & she is off the ACE now on Losartan & Amlodipine for her BP & ASHD... [name] her hosp XRays and Labs...  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 ~  CXR 1/12 showed basilar atx & large HH, cardiomeg, osteopenia, NAD... ~  [facility] 5/12 w/ refractory AB exac & improved w/ standard therapy & brief course of Pred;  CXR- similar, NAD...  HYPERTENSION (ICD-401.9) - now on LOSARTAN 100mg /d & AMLODIPINE 5mg /d (and off her prev Ramipril due to asthma & Imdur due to HA) ... BP= 110/72 & not checking BP's at home... she denies visual changes, CP, palipit, syncope, dyspnea, edema, etc...  ATHEROSCLEROTIC HEART DISEASE (ICD-414.00) & PERIPHERAL VASCULAR DISEASE (ICD-443.9) - regularly on ASA 81mg /d, & off Plavix... followed by [facility] for Cards (we do not have his notes to review med changes)>> ~  hosp in Jan08 w/ syncope and MRI Brain showed mild atrophy & sm vessel dis;  MRA showed basilar art narrowing & mod stenosis in RICA cavernous segment... CDopplers however were WNL w/ antegrade vertebral flow, and no signif ICA stenoses detected... ~  hosp Feb10 by [facility] w/ non-STEMI- s/p  PTCA and stent in the LAD. ~  hosp 7/11 by [facility] w/ CP, Abn Myoview & cath w/ 70% midLAD in-stent resteosis; s/p PTCA & started back on Plavix x 63mo. ~  she is in Cardiac Rehab & really likes it...  ATRIAL FLUTTER, PAROXYSMAL (ICD-427.32) - this occured 2/10 following her cath, PTCA, stent... he diagnosed flutter w/ 2:1 block... currently taking AMIODARONE 200mg - 1/2 tab weaned to MWF only...  HYPERCHOLESTEROLEMIA (ICD-272.0) - on CRESTOR 20mg /d... weight is 179#.[name]. ~  FLP 11/08 showed TChol 161, TG 39, HDL 62, LDL 92... continue same med. ~  FLP 5/09 showed TChol 209, TG 70, HDL 74, LDL 124... rec- better diet, get weight down! ~  FLP in hosp 2/10 showed TChol 130, TG 69, HDL 45, LDL 71 ~  she reports that [facility] does fasting labs... ~  FLP 7/11 in hosp showed TChol 105, TG 30, HDL 66, LDL 33 ~  FLP 2/12 on Cres20 showed TChol 97, TG 38, HDL 50, LDL 39... copy to Cards ?decr to 10mg ?  THYROID DISEASE >> Hx Amiodarone induced hyperthyroidism diagnosed 2/12 & referred to [facility], treated w/ PTU (now off) & DrHarwani tapered the Amio...  HIATAL HERNIA (ICD-553.3) - prev on Nexium & Carafate Liq- but changed to PEPCID 20mg Bid by [facility] due to need for Plavix... she has a giant HH measuring 10cm per DrSam, w/ erosions in the pouch... last EGD by DrSam 4/08... hernia is seen on her CXRs.  DIVERTICULOSIS OF COLON (ICD-562.10) & COLONIC POLYPS (ICD-211.3) ~  colonoscopy 11/06 showed divertics and 2- 4mm polyps... f/u planned in 4-40yrs... ~  [facility] 11/10 w/ lower GIB believed due to divertics... colonoscopy by DrJacobs showed severe divertics, no bleeding site, 7mm polyp removed...  DEGENERATIVE JOINT DISEASE (ICD-715.90) & HIP PAIN, RIGHT (ICD-719.45) - currently taking OTC meds only but DrDean rec Vicodin for as needed use (she prefers Tylenol)... Eval by DrDean 3/09 w/ bone-on-bone right hip, tried injection & pain meds... right THR done 12/09...  VITAMIN D DEFICIENCY (ICD-268.9) ~  labs  5/09 showed Vit D level = 9... pt started on 50K weekly but she switched on her own to 1000u/d OTC. ~  labs 2/12 showed Vit D level = 18... asked to incr Vit D supplement to 2000 u daily.  ANXIETY (ICD-300.00) - she is requesting a mild nerve pill & we have written ALPRAZOLAM 0.25mg  Prn use...  ANEMIA (ICD-285.9) - prev on Niferex 150mg /d, now FeSO4 325mg /d w/ Vit C 500mg ... 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 ~  last blood count 11/08 showed Hg=13.7, Fe=56... ~  labs 5/09 showed Hg= 14.2.[name]. ~  labs 2/10 in hosp showed Hg= 13 ~  [facility] 11/10 w/ lower GIB, &  Hg down to 8.6- tc 1u PC up to 9.3.Marland KitchenMarland Kitchen ~  labs 12/10 showed Hg= 10.6.[name]. rec> continue oral Fe w/ VitC... ~  [facility] labs 7/11 showed Hg= 11.6 - 10.5, MCV= 70... rec> restart Fe. ~  labs 2/12 showed Hg= 12.4, MCV= 77, Fe= 56 (sat=10%)... continue Fe w/ VitC. ~  Labs 5/12 in hosp showed Hg= 12.3, MCV= 68, & she continues on FeSO4 daily w/ VitC...  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   Past Surgical History  Procedure Date  . Total hip arthroplasty 12/09    Dr August Saucer  . Coronary angioplasty with stent placement 2/10    Dr Sharyn Lull  . Coronary angioplasty with stent placement 7/11    Dr Sharyn Lull    Outpatient Encounter Prescriptions as of 10/17/2010  Medication Sig Dispense Refill  . acetaminophen (TYLENOL) 325 MG tablet Take 650 mg by mouth every 6 (six) hours as needed.        . ALPRAZolam (XANAX) 0.25 MG tablet Take 1/2 to 1 tablet by mouth three times daily as needed for nerves  90 tablet  5  . amiodarone (PACERONE) 200 MG tablet Take 1/2 tab by mouth on Monday, Wednesday and friday      . amLODipine (NORVASC) 5 MG tablet Take 5 mg by mouth daily.        [name] aspirin 81 MG tablet Take 81 mg by mouth daily.        . Cholecalciferol (VITAMIN D) 1000 UNITS capsule Take 1,000 Units by mouth daily.        . famotidine (PEPCID) 20 MG tablet Take 1 tablet by  mouth Twice daily.      . ferrous gluconate (FERGON) 325 MG tablet Take 325 mg by mouth daily with breakfast. With vitamin C tablet       . losartan (COZAAR) 100 MG tablet Take 100 mg by mouth daily.        . nitroGLYCERIN (NITROSTAT) 0.4 MG SL tablet Place 0.4 mg under the tongue every 5 (five) minutes as needed. May repeat x3       . rosuvastatin (CRESTOR) 20 MG tablet Take 20 mg by mouth daily.        . vitamin C (ASCORBIC ACID) 500 MG tablet Take 500 mg by mouth daily. With iron tablet       . DISCONTD: HYDROcodone-homatropine (HYDROMET) 5-1.5 MG/5ML syrup Take 1 tsp by mouth every 6 hours as needed for cough  180 mL  1    No Known Allergies   Review of Systems         See HPI - all other systems neg except as noted... The patient complains of dyspnea on exertion.  The patient denies anorexia, fever, weight loss, weight gain, vision loss, decreased hearing, hoarseness, chest pain, syncope, peripheral edema, prolonged cough, headaches, hemoptysis, abdominal pain, melena, hematochezia, severe indigestion/heartburn, hematuria, incontinence, muscle weakness, suspicious skin lesions, transient blindness, difficulty walking, depression, unusual weight change, abnormal bleeding, enlarged lymph nodes, and angioedema.    Objective:   Physical Exam     WD, WN, 77 y/o BF in NAD... GENERAL:  Alert & oriented; pleasant & cooperative... HEENT:  [place]/AT, EOM-wnl, EACs-clear, TMs-wnl, NOSE-clear, THROAT-clear & wnl. NECK:  Supple w/ fairROM; no JVD; normal carotid impulses w/o bruits; no thyromegaly or nodules palpated; no lymphadenopathy. CHEST:  Coarse BS w/ no wheezing, no rales, no signs of consolidation. HEART:  Regular Rhythm;  gr 1/6 SEM without rubs or gallops detected... ABDOMEN:  Soft & nontender; normal bowel sounds; no organomegaly or masses palpated... EXT: without deformities, mod arthritic changes; s/p right THR; no varicose veins/ +venous insuffic/  tr edema . BACK:  sl tender along  right PSIS area... NEURO:  CN's intact; no focal neuro defict... DERM:  no lesions seen...   Assessment & Plan:   AB>  Improved overall & she stopped all meds on her own...   HBP>  Well controlled on the Losartan & Amlodipine; asked to monitor BP at home...  ASHD>  Followed by [facility] for Cards...  AFlutter>  DrHarwani has weaned the Amio to 1/2 tab on MWF; her prev thyroid problems have resolved...  CHOL>  On Cres20 w/ good control of FLP...  THYROID DISEASE>  Prev Amio induced hyperthyroidism managed by DrKumar on PTU which she has recently come off of per his recommendation...  GI>  Hx HH/ GERD/ Divertics/ Polyps>  She is followed by DrJacobs for GI, continue H2blocker Rx...  DJD>  She is followed by DrDean for Ortho...  Anemia>  Hg is OK but MCV is low, we will f/u Fe on ret visit...  Anxiety>  Requesting a mild nerve pill & we wrote for Alprazolam..[name]

## 2010-10-19 ENCOUNTER — Encounter (HOSPITAL_COMMUNITY): Payer: Self-pay

## 2010-10-22 ENCOUNTER — Encounter (HOSPITAL_COMMUNITY): Payer: Self-pay

## 2010-10-24 ENCOUNTER — Encounter (HOSPITAL_COMMUNITY): Payer: Self-pay

## 2010-10-26 ENCOUNTER — Encounter (HOSPITAL_COMMUNITY): Payer: Self-pay

## 2010-10-29 ENCOUNTER — Encounter (HOSPITAL_COMMUNITY): Payer: Self-pay

## 2010-10-31 ENCOUNTER — Encounter (HOSPITAL_COMMUNITY): Payer: Self-pay

## 2010-11-02 ENCOUNTER — Encounter (HOSPITAL_COMMUNITY): Payer: Self-pay

## 2010-11-05 ENCOUNTER — Encounter (HOSPITAL_COMMUNITY): Payer: Self-pay | Attending: Cardiology

## 2010-11-05 DIAGNOSIS — K219 Gastro-esophageal reflux disease without esophagitis: Secondary | ICD-10-CM | POA: Insufficient documentation

## 2010-11-05 DIAGNOSIS — I252 Old myocardial infarction: Secondary | ICD-10-CM | POA: Insufficient documentation

## 2010-11-05 DIAGNOSIS — Z7902 Long term (current) use of antithrombotics/antiplatelets: Secondary | ICD-10-CM | POA: Insufficient documentation

## 2010-11-05 DIAGNOSIS — I2510 Atherosclerotic heart disease of native coronary artery without angina pectoris: Secondary | ICD-10-CM | POA: Insufficient documentation

## 2010-11-05 DIAGNOSIS — M1990 Unspecified osteoarthritis, unspecified site: Secondary | ICD-10-CM | POA: Insufficient documentation

## 2010-11-05 DIAGNOSIS — Z5189 Encounter for other specified aftercare: Secondary | ICD-10-CM | POA: Insufficient documentation

## 2010-11-05 DIAGNOSIS — I10 Essential (primary) hypertension: Secondary | ICD-10-CM | POA: Insufficient documentation

## 2010-11-05 DIAGNOSIS — Z9861 Coronary angioplasty status: Secondary | ICD-10-CM | POA: Insufficient documentation

## 2010-11-05 DIAGNOSIS — E7800 Pure hypercholesterolemia, unspecified: Secondary | ICD-10-CM | POA: Insufficient documentation

## 2010-11-05 DIAGNOSIS — Z79899 Other long term (current) drug therapy: Secondary | ICD-10-CM | POA: Insufficient documentation

## 2010-11-05 DIAGNOSIS — I4891 Unspecified atrial fibrillation: Secondary | ICD-10-CM | POA: Insufficient documentation

## 2010-11-05 DIAGNOSIS — I209 Angina pectoris, unspecified: Secondary | ICD-10-CM | POA: Insufficient documentation

## 2010-11-05 DIAGNOSIS — Z7982 Long term (current) use of aspirin: Secondary | ICD-10-CM | POA: Insufficient documentation

## 2010-11-07 ENCOUNTER — Encounter (HOSPITAL_COMMUNITY): Payer: Self-pay

## 2010-11-09 ENCOUNTER — Encounter (HOSPITAL_COMMUNITY): Payer: Self-pay

## 2010-11-12 ENCOUNTER — Encounter (HOSPITAL_COMMUNITY): Payer: Self-pay

## 2010-11-14 ENCOUNTER — Encounter (HOSPITAL_COMMUNITY): Payer: Self-pay

## 2010-11-16 ENCOUNTER — Encounter (HOSPITAL_COMMUNITY): Payer: Self-pay

## 2010-11-19 ENCOUNTER — Encounter (HOSPITAL_COMMUNITY): Payer: Self-pay

## 2010-11-21 ENCOUNTER — Encounter (HOSPITAL_COMMUNITY): Payer: Self-pay

## 2010-11-23 ENCOUNTER — Encounter (HOSPITAL_COMMUNITY): Payer: Self-pay

## 2010-11-26 ENCOUNTER — Encounter (HOSPITAL_COMMUNITY): Payer: Self-pay

## 2010-11-28 ENCOUNTER — Encounter (HOSPITAL_COMMUNITY): Payer: Self-pay

## 2010-11-30 ENCOUNTER — Encounter (HOSPITAL_COMMUNITY): Payer: Self-pay

## 2010-12-03 ENCOUNTER — Encounter (HOSPITAL_COMMUNITY): Payer: Self-pay | Attending: Cardiology

## 2010-12-03 DIAGNOSIS — I209 Angina pectoris, unspecified: Secondary | ICD-10-CM | POA: Insufficient documentation

## 2010-12-03 DIAGNOSIS — I252 Old myocardial infarction: Secondary | ICD-10-CM | POA: Insufficient documentation

## 2010-12-03 DIAGNOSIS — E7800 Pure hypercholesterolemia, unspecified: Secondary | ICD-10-CM | POA: Insufficient documentation

## 2010-12-03 DIAGNOSIS — K219 Gastro-esophageal reflux disease without esophagitis: Secondary | ICD-10-CM | POA: Insufficient documentation

## 2010-12-03 DIAGNOSIS — Z9861 Coronary angioplasty status: Secondary | ICD-10-CM | POA: Insufficient documentation

## 2010-12-03 DIAGNOSIS — I2510 Atherosclerotic heart disease of native coronary artery without angina pectoris: Secondary | ICD-10-CM | POA: Insufficient documentation

## 2010-12-03 DIAGNOSIS — I10 Essential (primary) hypertension: Secondary | ICD-10-CM | POA: Insufficient documentation

## 2010-12-03 DIAGNOSIS — Z5189 Encounter for other specified aftercare: Secondary | ICD-10-CM | POA: Insufficient documentation

## 2010-12-03 DIAGNOSIS — Z79899 Other long term (current) drug therapy: Secondary | ICD-10-CM | POA: Insufficient documentation

## 2010-12-03 DIAGNOSIS — M1990 Unspecified osteoarthritis, unspecified site: Secondary | ICD-10-CM | POA: Insufficient documentation

## 2010-12-03 DIAGNOSIS — Z7982 Long term (current) use of aspirin: Secondary | ICD-10-CM | POA: Insufficient documentation

## 2010-12-03 DIAGNOSIS — Z7902 Long term (current) use of antithrombotics/antiplatelets: Secondary | ICD-10-CM | POA: Insufficient documentation

## 2010-12-03 DIAGNOSIS — I4891 Unspecified atrial fibrillation: Secondary | ICD-10-CM | POA: Insufficient documentation

## 2010-12-04 LAB — URINALYSIS, ROUTINE W REFLEX MICROSCOPIC
Glucose, UA: NEGATIVE mg/dL
Hgb urine dipstick: NEGATIVE
Ketones, ur: NEGATIVE mg/dL
Protein, ur: 100 mg/dL — AB

## 2010-12-04 LAB — CBC
Hemoglobin: 13.2 g/dL (ref 12.0–15.0)
MCHC: 32.9 g/dL (ref 30.0–36.0)
MCV: 77.7 fL — ABNORMAL LOW (ref 78.0–100.0)
RBC: 5.16 MIL/uL — ABNORMAL HIGH (ref 3.87–5.11)

## 2010-12-04 LAB — BASIC METABOLIC PANEL
CO2: 26 mEq/L (ref 19–32)
Chloride: 108 mEq/L (ref 96–112)
Creatinine, Ser: 0.78 mg/dL (ref 0.4–1.2)
GFR calc Af Amer: >60 mL/min (ref >60)

## 2010-12-04 LAB — URINE MICROSCOPIC-ADD ON

## 2010-12-04 LAB — URINE CULTURE: Colony Count: 55000

## 2010-12-05 ENCOUNTER — Encounter (HOSPITAL_COMMUNITY): Payer: Self-pay

## 2010-12-07 ENCOUNTER — Encounter (HOSPITAL_COMMUNITY): Payer: Self-pay

## 2010-12-07 LAB — GLUCOSE, CAPILLARY
Glucose-Capillary: 116 mg/dL — ABNORMAL HIGH (ref 70–99)
Glucose-Capillary: 117 mg/dL — ABNORMAL HIGH (ref 70–99)
Glucose-Capillary: 125 mg/dL — ABNORMAL HIGH (ref 70–99)
Glucose-Capillary: 181 mg/dL — ABNORMAL HIGH (ref 70–99)
Glucose-Capillary: 93 mg/dL (ref 70–99)

## 2010-12-07 LAB — CBC
HCT: 28.3 % — ABNORMAL LOW (ref 36.0–46.0)
HCT: 30.2 % — ABNORMAL LOW (ref 36.0–46.0)
Hemoglobin: 10 g/dL — ABNORMAL LOW (ref 12.0–15.0)
Hemoglobin: 8.1 g/dL — ABNORMAL LOW (ref 12.0–15.0)
Hemoglobin: 9.4 g/dL — ABNORMAL LOW (ref 12.0–15.0)
MCHC: 33.4 g/dL (ref 30.0–36.0)
MCHC: 34 g/dL (ref 30.0–36.0)
MCV: 80.9 fL (ref 78.0–100.0)
Platelets: 150 10*3/uL (ref 150–400)
RBC: 3.07 MIL/uL — ABNORMAL LOW (ref 3.87–5.11)
RDW: 16.8 % — ABNORMAL HIGH (ref 11.5–15.5)
WBC: 12.4 10*3/uL — ABNORMAL HIGH (ref 4.0–10.5)
WBC: 8 10*3/uL (ref 4.0–10.5)

## 2010-12-07 LAB — BASIC METABOLIC PANEL
BUN: 24 mg/dL — ABNORMAL HIGH (ref 6–23)
CO2: 24 mEq/L (ref 19–32)
Calcium: 7.6 mg/dL — ABNORMAL LOW (ref 8.4–10.5)
GFR calc Af Amer: >60 mL/min (ref >60)
GFR calc non Af Amer: 25 mL/min — ABNORMAL LOW (ref >60)
Potassium: 5.7 mEq/L — ABNORMAL HIGH (ref 3.5–5.1)
Sodium: 134 mEq/L — ABNORMAL LOW (ref 135–145)
Sodium: 136 mEq/L (ref 135–145)

## 2010-12-07 LAB — PROTIME-INR
INR: 1.1 (ref 0.00–1.49)
INR: 1.5 (ref 0.00–1.49)
INR: 2.1 — ABNORMAL HIGH (ref 0.00–1.49)
INR: 2.1 — ABNORMAL HIGH (ref 0.00–1.49)
Prothrombin Time: 14.4 seconds (ref 11.6–15.2)
Prothrombin Time: 25 seconds — ABNORMAL HIGH (ref 11.6–15.2)
Prothrombin Time: 25.2 seconds — ABNORMAL HIGH (ref 11.6–15.2)

## 2010-12-07 LAB — CROSSMATCH
ABO/RH(D): O POS
Antibody Screen: NEGATIVE

## 2010-12-07 LAB — URINALYSIS, ROUTINE W REFLEX MICROSCOPIC
Glucose, UA: NEGATIVE mg/dL
Ketones, ur: 15 mg/dL — AB
Specific Gravity, Urine: 1.026 (ref 1.005–1.030)
pH: 6 (ref 5.0–8.0)

## 2010-12-07 LAB — URINE MICROSCOPIC-ADD ON

## 2010-12-10 ENCOUNTER — Encounter (HOSPITAL_COMMUNITY): Payer: Self-pay

## 2010-12-12 ENCOUNTER — Encounter (HOSPITAL_COMMUNITY): Payer: Self-pay

## 2010-12-14 ENCOUNTER — Encounter (HOSPITAL_COMMUNITY): Payer: Self-pay

## 2010-12-17 ENCOUNTER — Encounter (HOSPITAL_COMMUNITY): Payer: Self-pay

## 2010-12-19 ENCOUNTER — Encounter (HOSPITAL_COMMUNITY): Payer: Self-pay

## 2010-12-21 ENCOUNTER — Encounter (HOSPITAL_COMMUNITY): Payer: Self-pay

## 2010-12-24 ENCOUNTER — Encounter (HOSPITAL_COMMUNITY): Payer: Self-pay

## 2010-12-26 ENCOUNTER — Encounter (HOSPITAL_COMMUNITY): Payer: Self-pay

## 2010-12-28 ENCOUNTER — Encounter (HOSPITAL_COMMUNITY): Payer: Self-pay

## 2010-12-31 ENCOUNTER — Encounter (HOSPITAL_COMMUNITY): Payer: Self-pay

## 2011-01-02 ENCOUNTER — Encounter (HOSPITAL_COMMUNITY): Payer: Self-pay

## 2011-01-04 ENCOUNTER — Encounter (HOSPITAL_COMMUNITY): Payer: Self-pay

## 2011-01-07 ENCOUNTER — Encounter (HOSPITAL_COMMUNITY): Payer: Self-pay

## 2011-01-07 DIAGNOSIS — M1990 Unspecified osteoarthritis, unspecified site: Secondary | ICD-10-CM | POA: Insufficient documentation

## 2011-01-07 DIAGNOSIS — Z9861 Coronary angioplasty status: Secondary | ICD-10-CM | POA: Insufficient documentation

## 2011-01-07 DIAGNOSIS — I2510 Atherosclerotic heart disease of native coronary artery without angina pectoris: Secondary | ICD-10-CM | POA: Insufficient documentation

## 2011-01-07 DIAGNOSIS — I4891 Unspecified atrial fibrillation: Secondary | ICD-10-CM | POA: Insufficient documentation

## 2011-01-07 DIAGNOSIS — I252 Old myocardial infarction: Secondary | ICD-10-CM | POA: Insufficient documentation

## 2011-01-07 DIAGNOSIS — Z5189 Encounter for other specified aftercare: Secondary | ICD-10-CM | POA: Insufficient documentation

## 2011-01-07 DIAGNOSIS — I10 Essential (primary) hypertension: Secondary | ICD-10-CM | POA: Insufficient documentation

## 2011-01-07 DIAGNOSIS — K219 Gastro-esophageal reflux disease without esophagitis: Secondary | ICD-10-CM | POA: Insufficient documentation

## 2011-01-07 DIAGNOSIS — Z7902 Long term (current) use of antithrombotics/antiplatelets: Secondary | ICD-10-CM | POA: Insufficient documentation

## 2011-01-07 DIAGNOSIS — Z79899 Other long term (current) drug therapy: Secondary | ICD-10-CM | POA: Insufficient documentation

## 2011-01-07 DIAGNOSIS — I209 Angina pectoris, unspecified: Secondary | ICD-10-CM | POA: Insufficient documentation

## 2011-01-07 DIAGNOSIS — E7800 Pure hypercholesterolemia, unspecified: Secondary | ICD-10-CM | POA: Insufficient documentation

## 2011-01-07 DIAGNOSIS — Z7982 Long term (current) use of aspirin: Secondary | ICD-10-CM | POA: Insufficient documentation

## 2011-01-09 ENCOUNTER — Encounter (HOSPITAL_COMMUNITY): Payer: Self-pay

## 2011-01-11 ENCOUNTER — Encounter (HOSPITAL_COMMUNITY): Payer: Self-pay

## 2011-01-14 ENCOUNTER — Encounter (HOSPITAL_COMMUNITY): Payer: Self-pay

## 2011-01-16 ENCOUNTER — Encounter (HOSPITAL_COMMUNITY): Payer: Self-pay

## 2011-01-18 ENCOUNTER — Encounter (HOSPITAL_COMMUNITY): Payer: Self-pay

## 2011-01-21 ENCOUNTER — Encounter (HOSPITAL_COMMUNITY): Payer: Self-pay

## 2011-01-23 ENCOUNTER — Encounter (HOSPITAL_COMMUNITY): Payer: Self-pay

## 2011-01-25 ENCOUNTER — Encounter (HOSPITAL_COMMUNITY): Payer: Self-pay

## 2011-01-28 ENCOUNTER — Encounter (HOSPITAL_COMMUNITY)
Admission: RE | Admit: 2011-01-28 | Discharge: 2011-01-28 | Disposition: A | Discharge status: Home / Self Care | Payer: Self-pay | Source: Ambulatory Visit | Attending: Cardiology | Admitting: Cardiology

## 2011-01-30 ENCOUNTER — Encounter (HOSPITAL_COMMUNITY)
Admission: RE | Admit: 2011-01-30 | Discharge: 2011-01-30 | Disposition: A | Discharge status: Home / Self Care | Payer: Self-pay | Source: Ambulatory Visit | Attending: Cardiology | Admitting: Cardiology

## 2011-02-01 ENCOUNTER — Encounter (HOSPITAL_COMMUNITY)
Admission: RE | Admit: 2011-02-01 | Discharge: 2011-02-01 | Disposition: A | Discharge status: Home / Self Care | Payer: Self-pay | Source: Ambulatory Visit | Attending: Cardiology | Admitting: Cardiology

## 2011-02-04 ENCOUNTER — Encounter (HOSPITAL_COMMUNITY): Payer: Self-pay

## 2011-02-06 ENCOUNTER — Encounter (HOSPITAL_COMMUNITY)
Admission: RE | Admit: 2011-02-06 | Discharge: 2011-02-06 | Disposition: A | Discharge status: Home / Self Care | Payer: Self-pay | Source: Ambulatory Visit | Attending: Cardiology | Admitting: Cardiology

## 2011-02-06 DIAGNOSIS — I252 Old myocardial infarction: Secondary | ICD-10-CM | POA: Insufficient documentation

## 2011-02-06 DIAGNOSIS — E7800 Pure hypercholesterolemia, unspecified: Secondary | ICD-10-CM | POA: Insufficient documentation

## 2011-02-06 DIAGNOSIS — M1990 Unspecified osteoarthritis, unspecified site: Secondary | ICD-10-CM | POA: Insufficient documentation

## 2011-02-06 DIAGNOSIS — Z5189 Encounter for other specified aftercare: Secondary | ICD-10-CM | POA: Insufficient documentation

## 2011-02-06 DIAGNOSIS — K219 Gastro-esophageal reflux disease without esophagitis: Secondary | ICD-10-CM | POA: Insufficient documentation

## 2011-02-06 DIAGNOSIS — I4891 Unspecified atrial fibrillation: Secondary | ICD-10-CM | POA: Insufficient documentation

## 2011-02-06 DIAGNOSIS — I209 Angina pectoris, unspecified: Secondary | ICD-10-CM | POA: Insufficient documentation

## 2011-02-06 DIAGNOSIS — I2510 Atherosclerotic heart disease of native coronary artery without angina pectoris: Secondary | ICD-10-CM | POA: Insufficient documentation

## 2011-02-06 DIAGNOSIS — Z79899 Other long term (current) drug therapy: Secondary | ICD-10-CM | POA: Insufficient documentation

## 2011-02-06 DIAGNOSIS — Z7982 Long term (current) use of aspirin: Secondary | ICD-10-CM | POA: Insufficient documentation

## 2011-02-06 DIAGNOSIS — Z9861 Coronary angioplasty status: Secondary | ICD-10-CM | POA: Insufficient documentation

## 2011-02-06 DIAGNOSIS — Z7902 Long term (current) use of antithrombotics/antiplatelets: Secondary | ICD-10-CM | POA: Insufficient documentation

## 2011-02-06 DIAGNOSIS — I10 Essential (primary) hypertension: Secondary | ICD-10-CM | POA: Insufficient documentation

## 2011-02-08 ENCOUNTER — Ambulatory Visit: Payer: Medicare Other

## 2011-02-08 ENCOUNTER — Encounter (HOSPITAL_COMMUNITY)
Admission: RE | Admit: 2011-02-08 | Discharge: 2011-02-08 | Disposition: A | Discharge status: Home / Self Care | Payer: Self-pay | Source: Ambulatory Visit | Attending: Cardiology | Admitting: Cardiology

## 2011-02-08 ENCOUNTER — Ambulatory Visit (INDEPENDENT_AMBULATORY_CARE_PROVIDER_SITE_OTHER): Payer: Medicare Other

## 2011-02-08 DIAGNOSIS — Z23 Encounter for immunization: Secondary | ICD-10-CM

## 2011-02-11 ENCOUNTER — Encounter (HOSPITAL_COMMUNITY)
Admission: RE | Admit: 2011-02-11 | Discharge: 2011-02-11 | Disposition: A | Discharge status: Home / Self Care | Payer: Self-pay | Source: Ambulatory Visit | Attending: Cardiology | Admitting: Cardiology

## 2011-02-13 ENCOUNTER — Encounter (HOSPITAL_COMMUNITY)
Admission: RE | Admit: 2011-02-13 | Discharge: 2011-02-13 | Disposition: A | Discharge status: Home / Self Care | Payer: Self-pay | Source: Ambulatory Visit | Attending: Cardiology | Admitting: Cardiology

## 2011-02-15 ENCOUNTER — Encounter (HOSPITAL_COMMUNITY)
Admission: RE | Admit: 2011-02-15 | Discharge: 2011-02-15 | Disposition: A | Discharge status: Home / Self Care | Payer: Self-pay | Source: Ambulatory Visit | Attending: Cardiology | Admitting: Cardiology

## 2011-02-18 ENCOUNTER — Encounter (HOSPITAL_COMMUNITY)
Admission: RE | Admit: 2011-02-18 | Discharge: 2011-02-18 | Disposition: A | Discharge status: Home / Self Care | Payer: Self-pay | Source: Ambulatory Visit | Attending: Cardiology | Admitting: Cardiology

## 2011-02-20 ENCOUNTER — Encounter (HOSPITAL_COMMUNITY): Payer: Self-pay

## 2011-02-22 ENCOUNTER — Encounter (HOSPITAL_COMMUNITY): Payer: Self-pay

## 2011-02-25 ENCOUNTER — Encounter (HOSPITAL_COMMUNITY): Payer: Self-pay

## 2011-02-27 ENCOUNTER — Encounter (HOSPITAL_COMMUNITY): Payer: Self-pay

## 2011-03-01 ENCOUNTER — Encounter (HOSPITAL_COMMUNITY): Payer: Self-pay

## 2011-03-04 ENCOUNTER — Encounter (HOSPITAL_COMMUNITY): Payer: Self-pay

## 2011-03-06 ENCOUNTER — Other Ambulatory Visit: Payer: Self-pay | Admitting: Pulmonary Disease

## 2011-03-06 ENCOUNTER — Other Ambulatory Visit: Payer: Self-pay | Admitting: Cardiology

## 2011-03-06 ENCOUNTER — Encounter (HOSPITAL_COMMUNITY): Payer: Self-pay

## 2011-03-08 ENCOUNTER — Encounter (HOSPITAL_COMMUNITY)
Admission: RE | Admit: 2011-03-08 | Discharge: 2011-03-08 | Disposition: A | Discharge status: Home / Self Care | Payer: Self-pay | Source: Ambulatory Visit | Attending: Cardiology | Admitting: Cardiology

## 2011-03-08 DIAGNOSIS — I4891 Unspecified atrial fibrillation: Secondary | ICD-10-CM | POA: Insufficient documentation

## 2011-03-08 DIAGNOSIS — Z5189 Encounter for other specified aftercare: Secondary | ICD-10-CM | POA: Insufficient documentation

## 2011-03-08 DIAGNOSIS — M1990 Unspecified osteoarthritis, unspecified site: Secondary | ICD-10-CM | POA: Insufficient documentation

## 2011-03-08 DIAGNOSIS — I209 Angina pectoris, unspecified: Secondary | ICD-10-CM | POA: Insufficient documentation

## 2011-03-08 DIAGNOSIS — Z9861 Coronary angioplasty status: Secondary | ICD-10-CM | POA: Insufficient documentation

## 2011-03-08 DIAGNOSIS — Z79899 Other long term (current) drug therapy: Secondary | ICD-10-CM | POA: Insufficient documentation

## 2011-03-08 DIAGNOSIS — K219 Gastro-esophageal reflux disease without esophagitis: Secondary | ICD-10-CM | POA: Insufficient documentation

## 2011-03-08 DIAGNOSIS — Z7982 Long term (current) use of aspirin: Secondary | ICD-10-CM | POA: Insufficient documentation

## 2011-03-08 DIAGNOSIS — I10 Essential (primary) hypertension: Secondary | ICD-10-CM | POA: Insufficient documentation

## 2011-03-08 DIAGNOSIS — I252 Old myocardial infarction: Secondary | ICD-10-CM | POA: Insufficient documentation

## 2011-03-08 DIAGNOSIS — Z7902 Long term (current) use of antithrombotics/antiplatelets: Secondary | ICD-10-CM | POA: Insufficient documentation

## 2011-03-08 DIAGNOSIS — I2510 Atherosclerotic heart disease of native coronary artery without angina pectoris: Secondary | ICD-10-CM | POA: Insufficient documentation

## 2011-03-08 DIAGNOSIS — E7800 Pure hypercholesterolemia, unspecified: Secondary | ICD-10-CM | POA: Insufficient documentation

## 2011-03-11 ENCOUNTER — Encounter (HOSPITAL_COMMUNITY)
Admission: RE | Admit: 2011-03-11 | Discharge: 2011-03-11 | Disposition: A | Discharge status: Home / Self Care | Payer: Self-pay | Source: Ambulatory Visit | Attending: Cardiology | Admitting: Cardiology

## 2011-03-13 ENCOUNTER — Encounter (HOSPITAL_COMMUNITY)
Admission: RE | Admit: 2011-03-13 | Discharge: 2011-03-13 | Disposition: A | Discharge status: Home / Self Care | Payer: Self-pay | Source: Ambulatory Visit | Attending: Cardiology | Admitting: Cardiology

## 2011-03-15 ENCOUNTER — Encounter (HOSPITAL_COMMUNITY)
Admission: RE | Admit: 2011-03-15 | Discharge: 2011-03-15 | Disposition: A | Discharge status: Home / Self Care | Payer: Self-pay | Source: Ambulatory Visit | Attending: Cardiology | Admitting: Cardiology

## 2011-03-18 ENCOUNTER — Encounter (HOSPITAL_COMMUNITY): Payer: Self-pay

## 2011-03-20 ENCOUNTER — Encounter (HOSPITAL_COMMUNITY)
Admission: RE | Admit: 2011-03-20 | Discharge: 2011-03-20 | Disposition: A | Discharge status: Home / Self Care | Payer: Self-pay | Source: Ambulatory Visit | Attending: Cardiology | Admitting: Cardiology

## 2011-03-22 ENCOUNTER — Encounter (HOSPITAL_COMMUNITY): Payer: Self-pay

## 2011-03-25 ENCOUNTER — Encounter (HOSPITAL_COMMUNITY): Payer: Self-pay

## 2011-03-27 ENCOUNTER — Encounter (HOSPITAL_COMMUNITY)
Admission: RE | Admit: 2011-03-27 | Discharge: 2011-03-27 | Disposition: A | Discharge status: Home / Self Care | Payer: Self-pay | Source: Ambulatory Visit | Attending: Cardiology | Admitting: Cardiology

## 2011-03-29 ENCOUNTER — Encounter (HOSPITAL_COMMUNITY): Payer: Self-pay

## 2011-04-01 ENCOUNTER — Encounter (HOSPITAL_COMMUNITY): Payer: Self-pay

## 2011-04-03 ENCOUNTER — Encounter (HOSPITAL_COMMUNITY)
Admission: RE | Admit: 2011-04-03 | Discharge: 2011-04-03 | Disposition: A | Discharge status: Home / Self Care | Payer: Self-pay | Source: Ambulatory Visit | Attending: Cardiology | Admitting: Cardiology

## 2011-04-05 ENCOUNTER — Encounter (HOSPITAL_COMMUNITY)
Admission: RE | Admit: 2011-04-05 | Discharge: 2011-04-05 | Disposition: A | Discharge status: Home / Self Care | Payer: Self-pay | Source: Ambulatory Visit | Attending: Cardiology | Admitting: Cardiology

## 2011-04-05 DIAGNOSIS — Z79899 Other long term (current) drug therapy: Secondary | ICD-10-CM | POA: Insufficient documentation

## 2011-04-05 DIAGNOSIS — I10 Essential (primary) hypertension: Secondary | ICD-10-CM | POA: Insufficient documentation

## 2011-04-05 DIAGNOSIS — M1990 Unspecified osteoarthritis, unspecified site: Secondary | ICD-10-CM | POA: Insufficient documentation

## 2011-04-05 DIAGNOSIS — Z7902 Long term (current) use of antithrombotics/antiplatelets: Secondary | ICD-10-CM | POA: Insufficient documentation

## 2011-04-05 DIAGNOSIS — K219 Gastro-esophageal reflux disease without esophagitis: Secondary | ICD-10-CM | POA: Insufficient documentation

## 2011-04-05 DIAGNOSIS — Z9861 Coronary angioplasty status: Secondary | ICD-10-CM | POA: Insufficient documentation

## 2011-04-05 DIAGNOSIS — Z5189 Encounter for other specified aftercare: Secondary | ICD-10-CM | POA: Insufficient documentation

## 2011-04-05 DIAGNOSIS — I2510 Atherosclerotic heart disease of native coronary artery without angina pectoris: Secondary | ICD-10-CM | POA: Insufficient documentation

## 2011-04-05 DIAGNOSIS — I252 Old myocardial infarction: Secondary | ICD-10-CM | POA: Insufficient documentation

## 2011-04-05 DIAGNOSIS — I4891 Unspecified atrial fibrillation: Secondary | ICD-10-CM | POA: Insufficient documentation

## 2011-04-05 DIAGNOSIS — E7800 Pure hypercholesterolemia, unspecified: Secondary | ICD-10-CM | POA: Insufficient documentation

## 2011-04-05 DIAGNOSIS — Z7982 Long term (current) use of aspirin: Secondary | ICD-10-CM | POA: Insufficient documentation

## 2011-04-05 DIAGNOSIS — I209 Angina pectoris, unspecified: Secondary | ICD-10-CM | POA: Insufficient documentation

## 2011-04-08 ENCOUNTER — Encounter (HOSPITAL_COMMUNITY)
Admission: RE | Admit: 2011-04-08 | Discharge: 2011-04-08 | Disposition: A | Discharge status: Home / Self Care | Payer: Self-pay | Source: Ambulatory Visit | Attending: Cardiology | Admitting: Cardiology

## 2011-04-10 ENCOUNTER — Encounter (HOSPITAL_COMMUNITY)
Admission: RE | Admit: 2011-04-10 | Discharge: 2011-04-10 | Disposition: A | Discharge status: Home / Self Care | Payer: Self-pay | Source: Ambulatory Visit | Attending: Cardiology | Admitting: Cardiology

## 2011-04-12 ENCOUNTER — Encounter (HOSPITAL_COMMUNITY)
Admission: RE | Admit: 2011-04-12 | Discharge: 2011-04-12 | Disposition: A | Discharge status: Home / Self Care | Payer: Self-pay | Source: Ambulatory Visit | Attending: Cardiology | Admitting: Cardiology

## 2011-04-15 ENCOUNTER — Encounter (HOSPITAL_COMMUNITY): Payer: Self-pay

## 2011-04-17 ENCOUNTER — Encounter (HOSPITAL_COMMUNITY): Payer: Self-pay

## 2011-04-19 ENCOUNTER — Encounter (HOSPITAL_COMMUNITY)
Admission: RE | Admit: 2011-04-19 | Discharge: 2011-04-19 | Disposition: A | Discharge status: Home / Self Care | Payer: Self-pay | Source: Ambulatory Visit | Attending: Cardiology | Admitting: Cardiology

## 2011-04-22 ENCOUNTER — Encounter (HOSPITAL_COMMUNITY): Payer: Self-pay

## 2011-04-23 ENCOUNTER — Ambulatory Visit (INDEPENDENT_AMBULATORY_CARE_PROVIDER_SITE_OTHER): Payer: Medicare Other | Admitting: Pulmonary Disease

## 2011-04-23 ENCOUNTER — Other Ambulatory Visit (INDEPENDENT_AMBULATORY_CARE_PROVIDER_SITE_OTHER): Payer: Medicare Other

## 2011-04-23 ENCOUNTER — Encounter: Payer: Self-pay | Admitting: Pulmonary Disease

## 2011-04-23 VITALS — BP 122/82 | HR 74 | Temp 97.0°F | Ht 62.0 in | Wt 175.4 lb

## 2011-04-23 DIAGNOSIS — E7800 Pure hypercholesterolemia, unspecified: Secondary | ICD-10-CM

## 2011-04-23 DIAGNOSIS — M1990 Unspecified osteoarthritis, unspecified site: Secondary | ICD-10-CM

## 2011-04-23 DIAGNOSIS — J45909 Unspecified asthma, uncomplicated: Secondary | ICD-10-CM

## 2011-04-23 DIAGNOSIS — I2510 Atherosclerotic heart disease of native coronary artery without angina pectoris: Secondary | ICD-10-CM

## 2011-04-23 DIAGNOSIS — K5730 Diverticulosis of large intestine without perforation or abscess without bleeding: Secondary | ICD-10-CM

## 2011-04-23 DIAGNOSIS — E079 Disorder of thyroid, unspecified: Secondary | ICD-10-CM

## 2011-04-23 DIAGNOSIS — I4892 Unspecified atrial flutter: Secondary | ICD-10-CM

## 2011-04-23 DIAGNOSIS — E559 Vitamin D deficiency, unspecified: Secondary | ICD-10-CM

## 2011-04-23 DIAGNOSIS — K449 Diaphragmatic hernia without obstruction or gangrene: Secondary | ICD-10-CM

## 2011-04-23 DIAGNOSIS — I10 Essential (primary) hypertension: Secondary | ICD-10-CM

## 2011-04-23 DIAGNOSIS — D126 Benign neoplasm of colon, unspecified: Secondary | ICD-10-CM

## 2011-04-23 DIAGNOSIS — D649 Anemia, unspecified: Secondary | ICD-10-CM

## 2011-04-23 LAB — HEPATIC FUNCTION PANEL
ALT: 20 U/L (ref 0–35)
Alkaline Phosphatase: 83 U/L (ref 39–117)
Bilirubin, Direct: 0.2 mg/dL (ref 0.0–0.3)
Total Bilirubin: 0.9 mg/dL (ref 0.3–1.2)

## 2011-04-23 LAB — CBC WITH DIFFERENTIAL/PLATELET
Basophils Relative: 0.5 % (ref 0.0–3.0)
Eosinophils Relative: 0.5 % (ref 0.0–5.0)
Lymphocytes Relative: 27.5 % (ref 12.0–46.0)
MCV: 78.1 fl (ref 78.0–100.0)
Monocytes Absolute: 0.6 10*3/uL (ref 0.1–1.0)
Monocytes Relative: 7.4 % (ref 3.0–12.0)
Neutrophils Relative %: 64.1 % (ref 43.0–77.0)
Platelets: 134 10*3/uL — ABNORMAL LOW (ref 150.0–400.0)
RBC: 5.18 Mil/uL — ABNORMAL HIGH (ref 3.87–5.11)
WBC: 7.7 10*3/uL (ref 4.5–10.5)

## 2011-04-23 LAB — BASIC METABOLIC PANEL
BUN: 26 mg/dL — ABNORMAL HIGH (ref 6–23)
Creatinine, Ser: 1.5 mg/dL — ABNORMAL HIGH (ref 0.4–1.2)
GFR: 44.23 mL/min — ABNORMAL LOW (ref >60.00)
Glucose, Bld: 85 mg/dL (ref 70–99)
Potassium: 4.5 mEq/L (ref 3.5–5.1)

## 2011-04-23 LAB — LIPID PANEL
HDL: 59.7 mg/dL (ref >39.00)
LDL Cholesterol: 50 mg/dL (ref 0–99)
Total CHOL/HDL Ratio: 2
VLDL: 8.4 mg/dL (ref 0.0–40.0)

## 2011-04-23 MED ORDER — AMLODIPINE BESYLATE 5 MG PO TABS: 5.0000 mg | ORAL_TABLET | Freq: Every day | ORAL | Status: DC

## 2011-04-23 NOTE — Patient Instructions (Signed)
Today we updated your med list in our EPIC system...    Continue your current medications the same...  Today we did your follow up fasting blood work...    Please call the [facility] in a few days for your results...    Dial [id number] & when prompted enter your patient number followed by the # symbol...    Your patient number is:  [id number]#  Keep up the good work on your diet & exercise program...  Call for any questions...  Congrats to our [employer]!!!  Let's plan a follow up visit in 6 months.Marland KitchenMarland Kitchen

## 2011-04-23 NOTE — Progress Notes (Signed)
Subjective:    Patient ID: Virginia Casey, female    DOB: 01/25/1925, 78 y.o.   MRN: [id number]  HPI 78 y/o BF here for a follow up visit... she has mult med problems as noted below...   ~  October 19, 2009:  she was hosp 7/29 - 10/04/09 by Maia Breslow for CP w/ Myoview showing ant ischemia, norm wall motion & EF= 82%;  cath showed 70% midLAD in-stent restenosis, otherw non-obstructive dis seen in other vessels;  subseq PTCA was successful & she was placed on PLAVIX x31mo (plus her ASA)...  her DC meds were changed to ASA/ PLAVIX, RAMIPRIL, IMDUR, AMIO, CRESTOR, PERCOCET...  we reviewed hosp records> CXR, Labs, etc (see below)...  Since hosp she tells me she stopped the Isosorbide on her own due to HA & "makes me sick";  since then her BP is sl higher ~150/90, & she has a call into DrHarwani's office...  ~  November 24, 2009:  add-on visit for cough x several days> states onset URI w/ cough, yellow sputum, low grade fever, congestion, and bilat rib pain from the cough... no hemoptysis, ch in dyspnea, angina CP, etc...  taking Tylenol, fluids, Robitussin... we discussed checking CXR (cardiomeg, HH, osteopenia, & mild basilar atx) & Rx w/ Depo80, Pred Dosepak, Augmentin, Mucinex, & Tramadol...  ~  April 18, 2010:    She reports a good 6 months- no new complaints or concerns;  Maia Breslow has her in Cardiac Rehab & she really like it- walking, biking, etc & no CP/ palpit/ SOB/ edema/ etc...    BP controlled & as prev noted DrHarwani makes freq changes in her meds & we don't have notes from his office (she reports off Imdur, off Plavix, on Amio 200mg - 1/2 daily);  Chol looks fabulous on Cres20 & she may indeed be able to decr to 10mg  dose (copy labs to [facility]);  GI stable on Pepcid, & as noted she is off the Plavix now);  her Hg is stable on the Fe & VitC;  only surprize finding is low TSH at 0.03 & we will contact pt to ret for further eval (Hyperthyroidism confirmed & she is on Amio> referred to [facility]-  treated w/ PTU 50mg  Tid, we don't have his notes to review)...   ~  Aug 01, 2010:  67mo ROV & post-hosp check> she was ADM 5/14 - 07/19/10 w/ refractory AB & she responded to IV & inhaled Rx being disch on Pred Dosepak, Mucinex, Hydromet;  She reports feeling much better, finished the Pred, & advised to use the Mucinex & Hydromet as needed...  She has already been back to see [facility] & she is off the ACE now on Losartan & Amlodipine for her BP & ASHD... [name] her hosp XRays and Labs...  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 She is very proud of being nominated for Mother of the Year thru her church...    AB> doing well, no resp infections or exacerbations, on no regular meds...    HBP> on Amlod5 & Losartan100; BP= 122/82 & she denies CP, palpit, SOB, edema; rec to continue same meds + diet...    CAD> followed by Maia Breslow; we do not have his notes; pt states she is doing fine & denies CP/ angina/ etc; she loves the cardiac rehab program...    PAFlutter> still on a low dose Amio per DrHarwani- 200mg tabs- taking 1/2 MWF; we do ot have records & rely on her hx & med list; she denies palpit, dizzy, SOB, etc...    Cerebrovasc dis> on ASA81; she denies cerebral ischemic symptoms...    Chol> on Cres20; FLP shows TChol 118, TG 42, HDL 60, LDL 50- looks great continue same...    Thyroid dis> followed by Aaron Edelman; she has  hx hyperthyroidism secondary to Amio & a prob multinod goiter; treated w/ Tapazole, then PTU but we do not have recent notes; she tells me that she is off the PTU & "everything is fine";     GI> HH, Divertics, Polyps> on PepcidBid    DJD> on Tylenol & VitD 1000u daily but Vit D level today is still low at 20 & she is asked to incr to 2000u/d...    Anxiety> on Alpraz0.25 prn which really helps...    Anemia> on Fe w/ VitC; Hg=13.1, MCV=78, Fe=66; rec to continue supplements... LABS 2/13:  FLP- great on Cres20;  Chems- ok w/ BUN=26 Creat=1.5;  CBC- wnl x MCV=78 Fe=66;  TSH=2.81;  VitD=20 & rec to incr supplement.  Problem List:  ASTHMATIC BRONCHITIS (ICD-466.0) - breathing is stable now- s/p recent exacerbation... ~  CXR 1/12 showed basilar atx & large HH, cardiomeg, osteopenia, NAD... ~  [facility] 5/12 w/ refractory AB exac & improved w/ standard therapy & brief course of Pred; CXR- similar, NAD...  HYPERTENSION (ICD-401.9) - now on LOSARTAN 100mg /d & AMLODIPINE 5mg /d (and off her prev Ramipril due to asthma & Imdur due to HA) ... BP= 110/72 & not checking BP's at home... she denies visual changes, CP, palipit, syncope, dyspnea, edema, etc...  ATHEROSCLEROTIC HEART DISEASE (ICD-414.00) & PERIPHERAL VASCULAR DISEASE (ICD-443.9) - regularly on ASA 81mg /d, & off Plavix... followed by [facility] for Cards (we do not have his notes to review med changes)>> ~  hosp in Jan08 w/ syncope and MRI Brain showed mild atrophy & sm vessel dis;  MRA showed basilar art narrowing & mod stenosis in RICA cavernous segment... CDopplers however were WNL w/ antegrade vertebral flow, and no signif ICA stenoses detected... ~  hosp Feb10 by [facility] w/ non-STEMI- s/p PTCA and stent in the LAD. ~  hosp 7/11 by [facility] w/ CP, Abn Myoview & cath w/ 70% midLAD in-stent resteosis; s/p PTCA & started back on Plavix x 73mo. ~  she is in Cardiac Rehab & really likes it...  ATRIAL FLUTTER, PAROXYSMAL (ICD-427.32) - this occured 2/10  following her cath, PTCA, stent... he diagnosed flutter w/ 2:1 block... currently taking AMIODARONE 200mg - 1/2 tab weaned to MWF only...  HYPERCHOLESTEROLEMIA (ICD-272.0) - on CRESTOR 20mg /d... weight is 179#.[name]. ~  FLP 11/08 showed TChol 161, TG 39, HDL 62, LDL 92... continue same med. ~  FLP 5/09 showed TChol 209, TG 70, HDL 74, LDL 124... rec- better diet, get weight down! ~  FLP in hosp 2/10 showed TChol 130, TG 69, HDL 45,  LDL 71 ~  she reports that [facility] does fasting labs... ~  FLP 7/11 in hosp showed TChol 105, TG 30, HDL 66, LDL 33 ~  FLP 2/12 on Cres20 showed TChol 97, TG 38, HDL 50, LDL 39... copy to Cards ?decr to 10mg ? ~  FLP 2/13 on Cres20 showed TChol 118, TG 42, HDL 60, LDL 50... Continue same.  THYROID DISEASE >> Hx Amiodarone induced hyperthyroidism diagnosed 2/12 & referred to [facility], treated w/ PTU (now off) & DrHarwani tapered the Amio...  HIATAL HERNIA (ICD-553.3) - prev on Nexium & Carafate Liq- but changed to PEPCID 20mg Bid by [facility] due to need for Plavix... she has a giant HH measuring 10cm per DrSam, w/ erosions in the pouch... last EGD by DrSam 4/08... hernia is seen on her CXRs.  DIVERTICULOSIS OF COLON (ICD-562.10) & COLONIC POLYPS (ICD-211.3) ~  colonoscopy 11/06 showed divertics and 2- 4mm polyps... f/u planned in 4-59yrs... ~  [facility] 11/10 w/ lower GIB believed due to divertics... colonoscopy by DrJacobs showed severe divertics, no bleeding site, 7mm polyp removed...  DEGENERATIVE JOINT DISEASE (ICD-715.90) & HIP PAIN, RIGHT (ICD-719.45) - currently taking OTC meds only but DrDean rec Vicodin for as needed use (she prefers Tylenol)... Eval by DrDean 3/09 w/ bone-on-bone right hip, tried injection & pain meds... right THR done 12/09...  VITAMIN D DEFICIENCY (ICD-268.9) ~  labs 5/09 showed Vit D level = 9... pt started on 50K weekly but she switched on her own to 1000u/d OTC. ~  labs 2/12 showed Vit D level = 18... asked to incr Vit D supplement to 2000 u  daily (she never did). ~  Labs 2/13 showed Vit D level = 20... Asked to finally incr VitD supplement to 2000u daily...  ANXIETY (ICD-300.00) - she is requesting a mild nerve pill & we have written ALPRAZOLAM 0.25mg  Prn use...  ANEMIA (ICD-285.9) - prev on Niferex 150mg /d, now FeSO4 325mg /d w/ Vit C 500mg ... 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 ~  last blood count 11/08 showed Hg=13.7, Fe=56... ~  labs 5/09 showed Hg= 14.2.[name]. ~  labs 2/10 in hosp showed Hg= 13 ~  [facility] 11/10 w/ lower GIB, & Hg down to 8.6- tc 1u PC up to 9.3.Marland KitchenMarland Kitchen ~  labs 12/10 showed Hg= 10.6.[name]. rec> continue oral Fe w/ VitC... ~  [facility] labs 7/11 showed Hg= 11.6 - 10.5, MCV= 70... rec> restart Fe. ~  labs 2/12 showed Hg= 12.4, MCV= 77, Fe= 56 (sat=10%)... continue Fe w/ VitC. ~  Labs 5/12 in hosp showed Hg= 12.3, MCV= 68, & she continues on FeSO4 daily w/ VitC... ~  Labs 2/13 showed Hg=13.1, MCV=78, Fe=66; rec to continue supplements...  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   Past Surgical History  Procedure Date  . Total hip arthroplasty 12/09    Dr August Saucer  . Coronary angioplasty with stent placement 2/10    Dr Sharyn Lull  . Coronary angioplasty with stent placement 7/11    Dr Sharyn Lull    Outpatient Encounter Prescriptions as of 04/23/2011  Medication Sig Dispense Refill  . acetaminophen (TYLENOL) 325 MG tablet Take 650 mg by mouth every 6 (six) hours as needed.        . ALPRAZolam (XANAX) 0.25 MG tablet take 1/2 to 1 tablet by mouth three times a day if needed for nerves  90 tablet  2  . amiodarone (PACERONE) 200 MG tablet Take 1/2  tab by mouth on Monday, Wednesday and friday      . amLODipine (NORVASC) 5 MG tablet Take 5 mg by mouth daily.        [name] aspirin 81 MG tablet Take 81 mg by mouth daily.        . Cholecalciferol (VITAMIN D) 1000 UNITS capsule Take 1,000 Units by mouth daily.        . famotidine (PEPCID) 20 MG tablet Take 1 tablet  by mouth Twice daily.      . ferrous gluconate (FERGON) 325 MG tablet Take 325 mg by mouth daily with breakfast. With vitamin C tablet       . losartan (COZAAR) 100 MG tablet Take 100 mg by mouth daily.        . nitroGLYCERIN (NITROSTAT) 0.4 MG SL tablet Place 0.4 mg under the tongue every 5 (five) minutes as needed. May repeat x3       . rosuvastatin (CRESTOR) 20 MG tablet Take 20 mg by mouth daily.        . vitamin C (ASCORBIC ACID) 500 MG tablet Take 500 mg by mouth daily. With iron tablet         No Known Allergies   Current Medications, Allergies, Past Medical History, Past Surgical History, Family History, and Social History were reviewed in [employer] record.   Review of Systems         See HPI - all other systems neg except as noted... The patient complains of dyspnea on exertion.  The patient denies anorexia, fever, weight loss, weight gain, vision loss, decreased hearing, hoarseness, chest pain, syncope, peripheral edema, prolonged cough, headaches, hemoptysis, abdominal pain, melena, hematochezia, severe indigestion/heartburn, hematuria, incontinence, muscle weakness, suspicious skin lesions, transient blindness, difficulty walking, depression, unusual weight change, abnormal bleeding, enlarged lymph nodes, and angioedema.     Objective:   Physical Exam     WD, WN, 78 y/o BF in NAD... GENERAL:  Alert & oriented; pleasant & cooperative... HEENT:  [place]/AT, EOM-wnl, EACs-clear, TMs-wnl, NOSE-clear, THROAT-clear & wnl. NECK:  Supple w/ fairROM; no JVD; normal carotid impulses w/o bruits; no thyromegaly or nodules palpated; no lymphadenopathy. CHEST:  Coarse BS w/ no wheezing, no rales, no signs of consolidation. HEART:  Regular Rhythm; gr 1/6 SEM without rubs or gallops detected... ABDOMEN:  Soft & nontender; normal bowel sounds; no organomegaly or masses palpated... EXT: without deformities, mod arthritic changes; s/p right THR; no varicose veins/ +venous  insuffic/  tr edema . BACK:  sl tender along right PSIS area... NEURO:  CN's intact; no focal neuro defict... DERM:  no lesions seen...  RADIOLOGY DATA:  Reviewed in the EPIC EMR & discussed w/ the patient...    >>CXR 5/12 showed mod sized HH, stable cardiomeg, bibasilar atx, osteopenia, NAD...  LABORATORY DATA:  Reviewed in the EPIC EMR & discussed w/ the patient...    >>LABS 2/13:  FLP- great on Cres20;  Chems- ok w/ BUN=26 Creat=1.5;  CBC- wnl x MCV=78 Fe=66;  TSH=2.81;  VitD=20 & rec to incr supplement.   Assessment & Plan:   AB>  Improved overall & she stopped all meds on her own...   HBP>  Well controlled on the Losartan & Amlodipine; asked to monitor BP at home...  ASHD>  Followed by [facility] for Cards...  AFlutter>  DrHarwani has weaned the Amio to 1/2 tab on MWF; her prev thyroid problems have resolved...  CHOL>  On Cres20 w/ good control of FLP...  THYROID DISEASE>  Prev Amio induced hyperthyroidism managed by DrKumar on PTU which she has recently come off of per his recommendation...  GI>  Hx HH/ GERD/ Divertics/ Polyps>  She is followed by DrJacobs for GI, continue H2blocker Rx...  DJD>  She is followed by DrDean for Ortho...  Anemia>  Hg is OK but MCV is low, Fe=66 & she will continue her Fe & VitC supplements...  Anxiety>  Requesting a mild nerve pill & we wrote for Alprazolam...   Patient's Medications  New Prescriptions   No medications on file  Previous Medications   ACETAMINOPHEN (TYLENOL) 325 MG TABLET    Take 650 mg by mouth every 6 (six) hours as needed.     ALPRAZOLAM (XANAX) 0.25 MG TABLET    take 1/2 to 1 tablet by mouth three times a day if needed for nerves   AMIODARONE (PACERONE) 200 MG TABLET    Take 1/2 tab by mouth on Monday, Wednesday and friday   ASPIRIN 81 MG TABLET    Take 81 mg by mouth daily.     CHOLECALCIFEROL (VITAMIN D) 1000 UNITS CAPSULE    Take 1,000 Units by mouth daily.     FAMOTIDINE (PEPCID) 20 MG TABLET    Take 1 tablet by  mouth Twice daily.   FERROUS GLUCONATE (FERGON) 325 MG TABLET    Take 325 mg by mouth daily with breakfast. With vitamin C tablet    LOSARTAN (COZAAR) 100 MG TABLET    Take 100 mg by mouth daily.     NITROGLYCERIN (NITROSTAT) 0.4 MG SL TABLET    Place 0.4 mg under the tongue every 5 (five) minutes as needed. May repeat x3    ROSUVASTATIN (CRESTOR) 20 MG TABLET    Take 20 mg by mouth daily.     VITAMIN C (ASCORBIC ACID) 500 MG TABLET    Take 500 mg by mouth daily. With iron tablet   Modified Medications   Modified Medication Previous Medication   AMLODIPINE (NORVASC) 5 MG TABLET amLODipine (NORVASC) 5 MG tablet      Take 1 tablet (5 mg total) by mouth daily.    Take 5 mg by mouth daily.    Discontinued Medications   No medications on file

## 2011-04-24 ENCOUNTER — Encounter (HOSPITAL_COMMUNITY)
Admission: RE | Admit: 2011-04-24 | Discharge: 2011-04-24 | Disposition: A | Discharge status: Home / Self Care | Payer: Self-pay | Source: Ambulatory Visit | Attending: Cardiology | Admitting: Cardiology

## 2011-04-26 ENCOUNTER — Encounter (HOSPITAL_COMMUNITY): Payer: Self-pay

## 2011-04-29 ENCOUNTER — Encounter (HOSPITAL_COMMUNITY)
Admission: RE | Admit: 2011-04-29 | Discharge: 2011-04-29 | Disposition: A | Discharge status: Home / Self Care | Payer: Self-pay | Source: Ambulatory Visit | Attending: Cardiology | Admitting: Cardiology

## 2011-05-01 ENCOUNTER — Encounter (HOSPITAL_COMMUNITY)
Admission: RE | Admit: 2011-05-01 | Discharge: 2011-05-01 | Disposition: A | Discharge status: Home / Self Care | Payer: Self-pay | Source: Ambulatory Visit | Attending: Cardiology | Admitting: Cardiology

## 2011-05-03 ENCOUNTER — Encounter (HOSPITAL_COMMUNITY): Payer: Self-pay

## 2011-05-03 DIAGNOSIS — K219 Gastro-esophageal reflux disease without esophagitis: Secondary | ICD-10-CM | POA: Insufficient documentation

## 2011-05-03 DIAGNOSIS — I252 Old myocardial infarction: Secondary | ICD-10-CM | POA: Insufficient documentation

## 2011-05-03 DIAGNOSIS — M1990 Unspecified osteoarthritis, unspecified site: Secondary | ICD-10-CM | POA: Insufficient documentation

## 2011-05-03 DIAGNOSIS — Z7902 Long term (current) use of antithrombotics/antiplatelets: Secondary | ICD-10-CM | POA: Insufficient documentation

## 2011-05-03 DIAGNOSIS — I10 Essential (primary) hypertension: Secondary | ICD-10-CM | POA: Insufficient documentation

## 2011-05-03 DIAGNOSIS — I4891 Unspecified atrial fibrillation: Secondary | ICD-10-CM | POA: Insufficient documentation

## 2011-05-03 DIAGNOSIS — I2510 Atherosclerotic heart disease of native coronary artery without angina pectoris: Secondary | ICD-10-CM | POA: Insufficient documentation

## 2011-05-03 DIAGNOSIS — Z5189 Encounter for other specified aftercare: Secondary | ICD-10-CM | POA: Insufficient documentation

## 2011-05-03 DIAGNOSIS — Z7982 Long term (current) use of aspirin: Secondary | ICD-10-CM | POA: Insufficient documentation

## 2011-05-03 DIAGNOSIS — Z79899 Other long term (current) drug therapy: Secondary | ICD-10-CM | POA: Insufficient documentation

## 2011-05-03 DIAGNOSIS — Z9861 Coronary angioplasty status: Secondary | ICD-10-CM | POA: Insufficient documentation

## 2011-05-03 DIAGNOSIS — E7800 Pure hypercholesterolemia, unspecified: Secondary | ICD-10-CM | POA: Insufficient documentation

## 2011-05-03 DIAGNOSIS — I209 Angina pectoris, unspecified: Secondary | ICD-10-CM | POA: Insufficient documentation

## 2011-05-06 ENCOUNTER — Encounter (HOSPITAL_COMMUNITY): Payer: Self-pay

## 2011-05-08 ENCOUNTER — Encounter (HOSPITAL_COMMUNITY): Payer: Self-pay

## 2011-05-10 ENCOUNTER — Encounter (HOSPITAL_COMMUNITY): Payer: Self-pay

## 2011-05-13 ENCOUNTER — Encounter (HOSPITAL_COMMUNITY): Payer: Self-pay

## 2011-05-15 ENCOUNTER — Encounter (HOSPITAL_COMMUNITY): Payer: Self-pay

## 2011-05-17 ENCOUNTER — Encounter (HOSPITAL_COMMUNITY): Payer: Self-pay

## 2011-05-20 ENCOUNTER — Encounter (HOSPITAL_COMMUNITY): Payer: Self-pay

## 2011-05-22 ENCOUNTER — Encounter (HOSPITAL_COMMUNITY)
Admission: RE | Admit: 2011-05-22 | Discharge: 2011-05-22 | Disposition: A | Discharge status: Home / Self Care | Payer: Self-pay | Source: Ambulatory Visit | Attending: Cardiology | Admitting: Cardiology

## 2011-05-24 ENCOUNTER — Encounter (HOSPITAL_COMMUNITY)
Admission: RE | Admit: 2011-05-24 | Discharge: 2011-05-24 | Disposition: A | Discharge status: Home / Self Care | Payer: Self-pay | Source: Ambulatory Visit | Attending: Cardiology | Admitting: Cardiology

## 2011-05-27 ENCOUNTER — Encounter (HOSPITAL_COMMUNITY)
Admission: RE | Admit: 2011-05-27 | Discharge: 2011-05-27 | Disposition: A | Discharge status: Home / Self Care | Payer: Self-pay | Source: Ambulatory Visit | Attending: Cardiology | Admitting: Cardiology

## 2011-05-29 ENCOUNTER — Encounter (HOSPITAL_COMMUNITY)
Admission: RE | Admit: 2011-05-29 | Discharge: 2011-05-29 | Disposition: A | Discharge status: Home / Self Care | Payer: Self-pay | Source: Ambulatory Visit | Attending: Cardiology | Admitting: Cardiology

## 2011-05-31 ENCOUNTER — Encounter (HOSPITAL_COMMUNITY)
Admission: RE | Admit: 2011-05-31 | Discharge: 2011-05-31 | Disposition: A | Discharge status: Home / Self Care | Payer: Self-pay | Source: Ambulatory Visit | Attending: Cardiology | Admitting: Cardiology

## 2011-06-03 ENCOUNTER — Encounter (HOSPITAL_COMMUNITY)
Admission: RE | Admit: 2011-06-03 | Discharge: 2011-06-03 | Disposition: A | Discharge status: Home / Self Care | Payer: Self-pay | Source: Ambulatory Visit | Attending: Cardiology | Admitting: Cardiology

## 2011-06-03 DIAGNOSIS — M1990 Unspecified osteoarthritis, unspecified site: Secondary | ICD-10-CM | POA: Insufficient documentation

## 2011-06-03 DIAGNOSIS — I252 Old myocardial infarction: Secondary | ICD-10-CM | POA: Insufficient documentation

## 2011-06-03 DIAGNOSIS — Z79899 Other long term (current) drug therapy: Secondary | ICD-10-CM | POA: Insufficient documentation

## 2011-06-03 DIAGNOSIS — I10 Essential (primary) hypertension: Secondary | ICD-10-CM | POA: Insufficient documentation

## 2011-06-03 DIAGNOSIS — Z7902 Long term (current) use of antithrombotics/antiplatelets: Secondary | ICD-10-CM | POA: Insufficient documentation

## 2011-06-03 DIAGNOSIS — E7800 Pure hypercholesterolemia, unspecified: Secondary | ICD-10-CM | POA: Insufficient documentation

## 2011-06-03 DIAGNOSIS — I2510 Atherosclerotic heart disease of native coronary artery without angina pectoris: Secondary | ICD-10-CM | POA: Insufficient documentation

## 2011-06-03 DIAGNOSIS — I4891 Unspecified atrial fibrillation: Secondary | ICD-10-CM | POA: Insufficient documentation

## 2011-06-03 DIAGNOSIS — Z9861 Coronary angioplasty status: Secondary | ICD-10-CM | POA: Insufficient documentation

## 2011-06-03 DIAGNOSIS — Z7982 Long term (current) use of aspirin: Secondary | ICD-10-CM | POA: Insufficient documentation

## 2011-06-03 DIAGNOSIS — K219 Gastro-esophageal reflux disease without esophagitis: Secondary | ICD-10-CM | POA: Insufficient documentation

## 2011-06-03 DIAGNOSIS — I209 Angina pectoris, unspecified: Secondary | ICD-10-CM | POA: Insufficient documentation

## 2011-06-03 DIAGNOSIS — Z5189 Encounter for other specified aftercare: Secondary | ICD-10-CM | POA: Insufficient documentation

## 2011-06-05 ENCOUNTER — Encounter (HOSPITAL_COMMUNITY): Payer: Self-pay

## 2011-06-07 ENCOUNTER — Encounter (HOSPITAL_COMMUNITY): Payer: Self-pay

## 2011-06-10 ENCOUNTER — Encounter (HOSPITAL_COMMUNITY): Payer: Self-pay

## 2011-06-12 ENCOUNTER — Encounter (HOSPITAL_COMMUNITY): Payer: Self-pay

## 2011-06-14 ENCOUNTER — Encounter (HOSPITAL_COMMUNITY): Payer: Self-pay

## 2011-06-17 ENCOUNTER — Encounter (HOSPITAL_COMMUNITY): Payer: Self-pay

## 2011-06-19 ENCOUNTER — Encounter (HOSPITAL_COMMUNITY)
Admission: RE | Admit: 2011-06-19 | Discharge: 2011-06-19 | Disposition: A | Discharge status: Home / Self Care | Payer: Self-pay | Source: Ambulatory Visit | Attending: Cardiology | Admitting: Cardiology

## 2011-06-21 ENCOUNTER — Encounter (HOSPITAL_COMMUNITY)
Admission: RE | Admit: 2011-06-21 | Discharge: 2011-06-21 | Disposition: A | Discharge status: Home / Self Care | Payer: Self-pay | Source: Ambulatory Visit | Attending: Cardiology | Admitting: Cardiology

## 2011-06-24 ENCOUNTER — Encounter (HOSPITAL_COMMUNITY)
Admission: RE | Admit: 2011-06-24 | Discharge: 2011-06-24 | Disposition: A | Discharge status: Home / Self Care | Payer: Self-pay | Source: Ambulatory Visit | Attending: Cardiology | Admitting: Cardiology

## 2011-06-26 ENCOUNTER — Encounter (HOSPITAL_COMMUNITY): Payer: Self-pay

## 2011-06-28 ENCOUNTER — Encounter (HOSPITAL_COMMUNITY)
Admission: RE | Admit: 2011-06-28 | Discharge: 2011-06-28 | Disposition: A | Discharge status: Home / Self Care | Payer: Self-pay | Source: Ambulatory Visit | Attending: Cardiology | Admitting: Cardiology

## 2011-07-01 ENCOUNTER — Encounter (HOSPITAL_COMMUNITY)
Admission: RE | Admit: 2011-07-01 | Discharge: 2011-07-01 | Disposition: A | Discharge status: Home / Self Care | Payer: Self-pay | Source: Ambulatory Visit | Attending: Cardiology | Admitting: Cardiology

## 2011-07-03 ENCOUNTER — Encounter (HOSPITAL_COMMUNITY)
Admission: RE | Admit: 2011-07-03 | Discharge: 2011-07-03 | Disposition: A | Discharge status: Home / Self Care | Payer: Self-pay | Source: Ambulatory Visit | Attending: Cardiology | Admitting: Cardiology

## 2011-07-03 DIAGNOSIS — I209 Angina pectoris, unspecified: Secondary | ICD-10-CM | POA: Insufficient documentation

## 2011-07-03 DIAGNOSIS — M1990 Unspecified osteoarthritis, unspecified site: Secondary | ICD-10-CM | POA: Insufficient documentation

## 2011-07-03 DIAGNOSIS — Z5189 Encounter for other specified aftercare: Secondary | ICD-10-CM | POA: Insufficient documentation

## 2011-07-03 DIAGNOSIS — Z79899 Other long term (current) drug therapy: Secondary | ICD-10-CM | POA: Insufficient documentation

## 2011-07-03 DIAGNOSIS — Z7982 Long term (current) use of aspirin: Secondary | ICD-10-CM | POA: Insufficient documentation

## 2011-07-03 DIAGNOSIS — E7800 Pure hypercholesterolemia, unspecified: Secondary | ICD-10-CM | POA: Insufficient documentation

## 2011-07-03 DIAGNOSIS — K219 Gastro-esophageal reflux disease without esophagitis: Secondary | ICD-10-CM | POA: Insufficient documentation

## 2011-07-03 DIAGNOSIS — Z9861 Coronary angioplasty status: Secondary | ICD-10-CM | POA: Insufficient documentation

## 2011-07-03 DIAGNOSIS — I4891 Unspecified atrial fibrillation: Secondary | ICD-10-CM | POA: Insufficient documentation

## 2011-07-03 DIAGNOSIS — I252 Old myocardial infarction: Secondary | ICD-10-CM | POA: Insufficient documentation

## 2011-07-03 DIAGNOSIS — Z7902 Long term (current) use of antithrombotics/antiplatelets: Secondary | ICD-10-CM | POA: Insufficient documentation

## 2011-07-03 DIAGNOSIS — I2510 Atherosclerotic heart disease of native coronary artery without angina pectoris: Secondary | ICD-10-CM | POA: Insufficient documentation

## 2011-07-03 DIAGNOSIS — I10 Essential (primary) hypertension: Secondary | ICD-10-CM | POA: Insufficient documentation

## 2011-07-05 ENCOUNTER — Encounter (HOSPITAL_COMMUNITY): Payer: Self-pay

## 2011-07-08 ENCOUNTER — Encounter (HOSPITAL_COMMUNITY)
Admission: RE | Admit: 2011-07-08 | Discharge: 2011-07-08 | Disposition: A | Discharge status: Home / Self Care | Payer: Self-pay | Source: Ambulatory Visit | Attending: Cardiology | Admitting: Cardiology

## 2011-07-10 ENCOUNTER — Encounter (HOSPITAL_COMMUNITY)
Admission: RE | Admit: 2011-07-10 | Discharge: 2011-07-10 | Disposition: A | Discharge status: Home / Self Care | Payer: Self-pay | Source: Ambulatory Visit | Attending: Cardiology | Admitting: Cardiology

## 2011-07-12 ENCOUNTER — Encounter (HOSPITAL_COMMUNITY)
Admission: RE | Admit: 2011-07-12 | Discharge: 2011-07-12 | Disposition: A | Discharge status: Home / Self Care | Payer: Self-pay | Source: Ambulatory Visit | Attending: Cardiology | Admitting: Cardiology

## 2011-07-15 ENCOUNTER — Encounter (HOSPITAL_COMMUNITY)
Admission: RE | Admit: 2011-07-15 | Discharge: 2011-07-15 | Disposition: A | Discharge status: Home / Self Care | Payer: Self-pay | Source: Ambulatory Visit | Attending: Cardiology | Admitting: Cardiology

## 2011-07-17 ENCOUNTER — Encounter (HOSPITAL_COMMUNITY)
Admission: RE | Admit: 2011-07-17 | Discharge: 2011-07-17 | Disposition: A | Discharge status: Home / Self Care | Payer: Self-pay | Source: Ambulatory Visit | Attending: Cardiology | Admitting: Cardiology

## 2011-07-19 ENCOUNTER — Encounter (HOSPITAL_COMMUNITY): Payer: Self-pay

## 2011-07-22 ENCOUNTER — Encounter (HOSPITAL_COMMUNITY): Payer: Self-pay

## 2011-07-24 ENCOUNTER — Encounter (HOSPITAL_COMMUNITY): Payer: Self-pay

## 2011-07-26 ENCOUNTER — Encounter (HOSPITAL_COMMUNITY): Payer: Self-pay

## 2011-07-29 ENCOUNTER — Encounter (HOSPITAL_COMMUNITY): Payer: Self-pay

## 2011-07-31 ENCOUNTER — Encounter (HOSPITAL_COMMUNITY): Payer: Self-pay

## 2011-08-02 ENCOUNTER — Encounter (HOSPITAL_COMMUNITY): Payer: Self-pay

## 2011-08-05 ENCOUNTER — Encounter (HOSPITAL_COMMUNITY): Payer: Self-pay

## 2011-08-05 DIAGNOSIS — Z79899 Other long term (current) drug therapy: Secondary | ICD-10-CM | POA: Insufficient documentation

## 2011-08-05 DIAGNOSIS — I252 Old myocardial infarction: Secondary | ICD-10-CM | POA: Insufficient documentation

## 2011-08-05 DIAGNOSIS — Z7902 Long term (current) use of antithrombotics/antiplatelets: Secondary | ICD-10-CM | POA: Insufficient documentation

## 2011-08-05 DIAGNOSIS — K219 Gastro-esophageal reflux disease without esophagitis: Secondary | ICD-10-CM | POA: Insufficient documentation

## 2011-08-05 DIAGNOSIS — E7800 Pure hypercholesterolemia, unspecified: Secondary | ICD-10-CM | POA: Insufficient documentation

## 2011-08-05 DIAGNOSIS — Z5189 Encounter for other specified aftercare: Secondary | ICD-10-CM | POA: Insufficient documentation

## 2011-08-05 DIAGNOSIS — I10 Essential (primary) hypertension: Secondary | ICD-10-CM | POA: Insufficient documentation

## 2011-08-05 DIAGNOSIS — M1990 Unspecified osteoarthritis, unspecified site: Secondary | ICD-10-CM | POA: Insufficient documentation

## 2011-08-05 DIAGNOSIS — I4891 Unspecified atrial fibrillation: Secondary | ICD-10-CM | POA: Insufficient documentation

## 2011-08-05 DIAGNOSIS — I2510 Atherosclerotic heart disease of native coronary artery without angina pectoris: Secondary | ICD-10-CM | POA: Insufficient documentation

## 2011-08-05 DIAGNOSIS — Z7982 Long term (current) use of aspirin: Secondary | ICD-10-CM | POA: Insufficient documentation

## 2011-08-05 DIAGNOSIS — Z9861 Coronary angioplasty status: Secondary | ICD-10-CM | POA: Insufficient documentation

## 2011-08-05 DIAGNOSIS — I209 Angina pectoris, unspecified: Secondary | ICD-10-CM | POA: Insufficient documentation

## 2011-08-07 ENCOUNTER — Encounter (HOSPITAL_COMMUNITY): Payer: Self-pay

## 2011-08-09 ENCOUNTER — Other Ambulatory Visit: Payer: Self-pay | Admitting: Pulmonary Disease

## 2011-08-09 ENCOUNTER — Encounter (HOSPITAL_COMMUNITY): Payer: Self-pay

## 2011-08-09 MED ORDER — LOSARTAN POTASSIUM 100 MG PO TABS: 100.0000 mg | ORAL_TABLET | Freq: Every day | ORAL | Status: DC

## 2011-08-12 ENCOUNTER — Encounter (HOSPITAL_COMMUNITY): Payer: Self-pay

## 2011-08-14 ENCOUNTER — Encounter (HOSPITAL_COMMUNITY): Payer: Self-pay

## 2011-08-16 ENCOUNTER — Encounter (HOSPITAL_COMMUNITY): Payer: Self-pay

## 2011-08-19 ENCOUNTER — Encounter (HOSPITAL_COMMUNITY)
Admission: RE | Admit: 2011-08-19 | Discharge: 2011-08-19 | Disposition: A | Discharge status: Home / Self Care | Payer: Self-pay | Source: Ambulatory Visit | Attending: Cardiology | Admitting: Cardiology

## 2011-08-21 ENCOUNTER — Encounter (HOSPITAL_COMMUNITY)
Admission: RE | Admit: 2011-08-21 | Discharge: 2011-08-21 | Disposition: A | Discharge status: Home / Self Care | Payer: Self-pay | Source: Ambulatory Visit | Attending: Cardiology | Admitting: Cardiology

## 2011-08-23 ENCOUNTER — Encounter (HOSPITAL_COMMUNITY)
Admission: RE | Admit: 2011-08-23 | Discharge: 2011-08-23 | Disposition: A | Discharge status: Home / Self Care | Payer: Self-pay | Source: Ambulatory Visit | Attending: Cardiology | Admitting: Cardiology

## 2011-08-26 ENCOUNTER — Encounter (HOSPITAL_COMMUNITY): Payer: Self-pay

## 2011-08-28 ENCOUNTER — Encounter (HOSPITAL_COMMUNITY)
Admission: RE | Admit: 2011-08-28 | Discharge: 2011-08-28 | Disposition: A | Discharge status: Home / Self Care | Payer: Self-pay | Source: Ambulatory Visit | Attending: Cardiology | Admitting: Cardiology

## 2011-08-30 ENCOUNTER — Encounter (HOSPITAL_COMMUNITY)
Admission: RE | Admit: 2011-08-30 | Discharge: 2011-08-30 | Disposition: A | Discharge status: Home / Self Care | Payer: Self-pay | Source: Ambulatory Visit | Attending: Cardiology | Admitting: Cardiology

## 2011-09-02 ENCOUNTER — Encounter (HOSPITAL_COMMUNITY): Payer: Medicare Other | Attending: Cardiology

## 2011-09-02 DIAGNOSIS — Z9861 Coronary angioplasty status: Secondary | ICD-10-CM | POA: Insufficient documentation

## 2011-09-02 DIAGNOSIS — M1990 Unspecified osteoarthritis, unspecified site: Secondary | ICD-10-CM | POA: Insufficient documentation

## 2011-09-02 DIAGNOSIS — I4891 Unspecified atrial fibrillation: Secondary | ICD-10-CM | POA: Insufficient documentation

## 2011-09-02 DIAGNOSIS — Z7902 Long term (current) use of antithrombotics/antiplatelets: Secondary | ICD-10-CM | POA: Insufficient documentation

## 2011-09-02 DIAGNOSIS — I252 Old myocardial infarction: Secondary | ICD-10-CM | POA: Insufficient documentation

## 2011-09-02 DIAGNOSIS — Z7982 Long term (current) use of aspirin: Secondary | ICD-10-CM | POA: Insufficient documentation

## 2011-09-02 DIAGNOSIS — I10 Essential (primary) hypertension: Secondary | ICD-10-CM | POA: Insufficient documentation

## 2011-09-02 DIAGNOSIS — Z79899 Other long term (current) drug therapy: Secondary | ICD-10-CM | POA: Insufficient documentation

## 2011-09-02 DIAGNOSIS — I2510 Atherosclerotic heart disease of native coronary artery without angina pectoris: Secondary | ICD-10-CM | POA: Insufficient documentation

## 2011-09-02 DIAGNOSIS — K219 Gastro-esophageal reflux disease without esophagitis: Secondary | ICD-10-CM | POA: Insufficient documentation

## 2011-09-02 DIAGNOSIS — Z5189 Encounter for other specified aftercare: Secondary | ICD-10-CM | POA: Insufficient documentation

## 2011-09-02 DIAGNOSIS — E7800 Pure hypercholesterolemia, unspecified: Secondary | ICD-10-CM | POA: Insufficient documentation

## 2011-09-02 DIAGNOSIS — I209 Angina pectoris, unspecified: Secondary | ICD-10-CM | POA: Insufficient documentation

## 2011-09-04 ENCOUNTER — Encounter (HOSPITAL_COMMUNITY): Payer: Medicare Other

## 2011-09-06 ENCOUNTER — Encounter (HOSPITAL_COMMUNITY): Payer: Medicare Other

## 2011-09-09 ENCOUNTER — Encounter (HOSPITAL_COMMUNITY): Payer: Medicare Other

## 2011-09-11 ENCOUNTER — Encounter (HOSPITAL_COMMUNITY): Payer: Medicare Other

## 2011-09-13 ENCOUNTER — Encounter (HOSPITAL_COMMUNITY): Payer: Medicare Other

## 2011-09-16 ENCOUNTER — Encounter (HOSPITAL_COMMUNITY): Payer: Medicare Other

## 2011-09-18 ENCOUNTER — Encounter (HOSPITAL_COMMUNITY): Payer: Medicare Other

## 2011-09-20 ENCOUNTER — Other Ambulatory Visit: Payer: Self-pay | Admitting: Pulmonary Disease

## 2011-09-20 ENCOUNTER — Encounter (HOSPITAL_COMMUNITY): Payer: Medicare Other

## 2011-09-23 ENCOUNTER — Encounter (HOSPITAL_COMMUNITY): Payer: Medicare Other

## 2011-09-24 ENCOUNTER — Telehealth: Payer: Self-pay | Admitting: Pulmonary Disease

## 2011-09-24 NOTE — Telephone Encounter (Signed)
Received a faxed refill request from [facility] for Alprazolam 0.25 mg tablet. Patient last seen 04/23/11. Dr. Kriste Basque please advise if ok to refill. Thanks.

## 2011-09-25 ENCOUNTER — Emergency Department (HOSPITAL_COMMUNITY)
Admission: EM | Admit: 2011-09-25 | Discharge: 2011-09-25 | Disposition: A | Discharge status: Home / Self Care | Payer: Medicare Other | Attending: Emergency Medicine | Admitting: Emergency Medicine

## 2011-09-25 ENCOUNTER — Other Ambulatory Visit: Payer: Self-pay | Admitting: Pulmonary Disease

## 2011-09-25 ENCOUNTER — Encounter (HOSPITAL_COMMUNITY): Payer: Medicare Other

## 2011-09-25 ENCOUNTER — Encounter (HOSPITAL_COMMUNITY): Payer: Self-pay

## 2011-09-25 ENCOUNTER — Emergency Department (HOSPITAL_COMMUNITY): Payer: Medicare Other

## 2011-09-25 DIAGNOSIS — R002 Palpitations: Secondary | ICD-10-CM

## 2011-09-25 DIAGNOSIS — Z79899 Other long term (current) drug therapy: Secondary | ICD-10-CM | POA: Insufficient documentation

## 2011-09-25 DIAGNOSIS — I10 Essential (primary) hypertension: Secondary | ICD-10-CM | POA: Insufficient documentation

## 2011-09-25 DIAGNOSIS — Z8679 Personal history of other diseases of the circulatory system: Secondary | ICD-10-CM

## 2011-09-25 DIAGNOSIS — R079 Chest pain, unspecified: Secondary | ICD-10-CM | POA: Insufficient documentation

## 2011-09-25 DIAGNOSIS — R209 Unspecified disturbances of skin sensation: Secondary | ICD-10-CM | POA: Insufficient documentation

## 2011-09-25 DIAGNOSIS — R202 Paresthesia of skin: Secondary | ICD-10-CM

## 2011-09-25 DIAGNOSIS — Z7982 Long term (current) use of aspirin: Secondary | ICD-10-CM | POA: Insufficient documentation

## 2011-09-25 DIAGNOSIS — E7800 Pure hypercholesterolemia, unspecified: Secondary | ICD-10-CM | POA: Insufficient documentation

## 2011-09-25 DIAGNOSIS — F411 Generalized anxiety disorder: Secondary | ICD-10-CM | POA: Insufficient documentation

## 2011-09-25 LAB — CBC WITH DIFFERENTIAL/PLATELET
Eosinophils Relative: 2 % (ref 0–5)
HCT: 42 % (ref 36.0–46.0)
Lymphocytes Relative: 41 % (ref 12–46)
Lymphs Abs: 2.5 10*3/uL (ref 0.7–4.0)
MCH: 25.2 pg — ABNORMAL LOW (ref 26.0–34.0)
MCV: 72.9 fL — ABNORMAL LOW (ref 78.0–100.0)
Monocytes Absolute: 0.4 10*3/uL (ref 0.1–1.0)
RBC: 5.76 MIL/uL — ABNORMAL HIGH (ref 3.87–5.11)
WBC: 6.1 10*3/uL (ref 4.0–10.5)

## 2011-09-25 LAB — BASIC METABOLIC PANEL
BUN: 24 mg/dL — ABNORMAL HIGH (ref 6–23)
CO2: 27 mEq/L (ref 19–32)
Calcium: 9.5 mg/dL (ref 8.4–10.5)
Creatinine, Ser: 1.18 mg/dL — ABNORMAL HIGH (ref 0.50–1.10)
Glucose, Bld: 89 mg/dL (ref 70–99)

## 2011-09-25 MED ORDER — ALPRAZOLAM 0.25 MG PO TABS: 0.2500 mg | ORAL_TABLET | Freq: Three times a day (TID) | ORAL | Status: DC | PRN

## 2011-09-25 NOTE — ED Provider Notes (Signed)
History     CSN: [id number]  Arrival date & time 09/25/11  1309   First MD Initiated Contact with Patient 09/25/11 1344      Chief Complaint  Patient presents with  . Irregular Heart Beat  . Chest Pain    (Consider location/radiation/quality/duration/timing/severity/associated sxs/prior treatment) Patient is a 78 y.o. female presenting with chest pain. The history is provided by the patient.  Chest Pain The chest pain began 3 - 5 hours ago. Chest pain occurs constantly. The chest pain is improving. Primary symptoms include shortness of breath. Pertinent negatives for primary symptoms include no fever, no abdominal pain, no nausea and no vomiting.  Associated symptoms include numbness. Associated symptoms comments: She is treated by Dr. Sharyn Lull for an "irregular heart beat" with small dose Amiodarone. She was seen by him 2 days ago for what she describes as chest fullness and mild SOB and he increased her Amiodarone for recurrent irregular rhythm. She felt better but had recurrent symptoms this morning and came here for evaluation. She also reports having the sensation of numbness in  her left lower extremity without weakness, that is improved but persistent..     Past Medical History  Diagnosis Date  . Asthmatic bronchitis   . HTN (hypertension)   . Atherosclerotic heart disease   . Atrial flutter, paroxysmal   . PVD (peripheral vascular disease)   . Hypercholesterolemia   . Hiatal hernia   . Diverticulosis of colon   . Colon polyp   . DJD (degenerative joint disease)   . Hip pain, right   . Vitamin d deficiency   . Anxiety   . Anemia     Past Surgical History  Procedure Date  . Total hip arthroplasty 12/09    Dr August Saucer  . Coronary angioplasty with stent placement 2/10    Dr Sharyn Lull  . Coronary angioplasty with stent placement 7/11    Dr Sharyn Lull    Family History  Problem Relation Age of Onset  . Heart disease Mother   . Heart disease Maternal Grandmother   .  Heart disease Maternal Grandfather   . Rheum arthritis Mother   . Rheum arthritis Father   . Lung cancer Father   . Stroke Maternal Aunt     History  Substance Use Topics  . Smoking status: Never Smoker   . Smokeless tobacco: Never Used  . Alcohol Use: No    OB History    Grav Para Term Preterm Abortions TAB SAB Ect Mult Living                  Review of Systems  Constitutional: Negative for fever.  Respiratory: Positive for shortness of breath.   Cardiovascular: Positive for chest pain.  Gastrointestinal: Negative for nausea, vomiting and abdominal pain.  Neurological: Positive for numbness.    Allergies  Review of patient's allergies indicates no known allergies.  Home Medications   Current Outpatient Rx  Name Route Sig Dispense Refill  . ACETAMINOPHEN 325 MG PO TABS Oral Take 325 mg by mouth every 6 (six) hours as needed. For pain    . ALPRAZOLAM 0.25 MG PO TABS  take 1/2 to 1 tablet by mouth three times a day if needed for nerves 90 tablet 2  . AMIODARONE HCL 200 MG PO TABS Oral Take 200 mg by mouth 2 (two) times daily. Take 1/2 tab by mouth on Monday, Wednesday and friday    . APIXABAN 5 MG PO TABS Oral Take 5 mg by  mouth 2 (two) times daily.    . ASPIRIN 81 MG PO TABS Oral Take 81 mg by mouth daily.     [name] VITAMIN D 1000 UNITS PO CAPS Oral Take 1,000 Units by mouth daily.      [name] FAMOTIDINE 20 MG PO TABS Oral Take 1 tablet by mouth Twice daily.    [name] FERROUS GLUCONATE 325 MG PO TABS Oral Take 325 mg by mouth daily with breakfast. With vitamin C tablet     . LOSARTAN POTASSIUM 100 MG PO TABS Oral Take 1 tablet (100 mg total) by mouth daily. 30 tablet 11  . NITROGLYCERIN 0.4 MG SL SUBL Sublingual Place 0.4 mg under the tongue every 5 (five) minutes as needed. May repeat x3     . ROSUVASTATIN CALCIUM 20 MG PO TABS Oral Take 20 mg by mouth daily.     [name] VITAMIN C 500 MG PO TABS Oral Take 500 mg by mouth daily. With iron tablet       BP 111/60  Pulse 52  Temp 98 F  (36.7 C) (Oral)  Resp 18  SpO2 96%  Physical Exam  Constitutional: She is oriented to person, place, and time. She appears well-developed and well-nourished.  HENT:  Head: Normocephalic.  Neck: Normal range of motion. Neck supple.  Cardiovascular: Normal rate and regular rhythm.   Pulmonary/Chest: Effort normal and breath sounds normal.  Abdominal: Soft. Bowel sounds are normal. There is no tenderness. There is no rebound and no guarding.  Musculoskeletal: Normal range of motion.  Neurological: She is alert and oriented to person, place, and time. Coordination normal.       Equal sensation bilateral lower extremities to light touch.  Skin: Skin is warm and dry. No rash noted.  Psychiatric: She has a normal mood and affect.    ED Course  Procedures (including critical care time)   Labs Reviewed  CBC WITH DIFFERENTIAL  BASIC METABOLIC PANEL  TROPONIN I   Dg Chest Portable 1 View  09/25/2011  *RADIOLOGY REPORT*  Clinical Data: Irregular heart beat  PORTABLE CHEST - 1 VIEW  Comparison: 07/16/2010  Findings: Stable large hiatal hernia and associated bibasilar atelectasis.  Normal heart size.  Upper lungs clear.  No pneumothorax or pleural effusion.  IMPRESSION: Stable hiatal hernia and basilar atelectasis.  Original Report Authenticated By: Donavan Burnet, M.D.    Date: 09/25/2011  Rate: 52  Rhythm: sinus bradycardia  QRS Axis: normal  Intervals: normal  ST/T Wave abnormalities: normal  Conduction Disutrbances:none  Narrative Interpretation:   Old EKG Reviewed: unchanged    No diagnosis found.  1. Palpitations, h/o a-fib 2. Paresthesias   MDM  Patient's EKG is NSR without arrhythmia. She does not have further symptoms of chest fullness. Will consult Dr. Sharyn Lull.       Rodena Medin, PA-C 09/25/11 1557

## 2011-09-25 NOTE — Telephone Encounter (Signed)
Faxed refill request received from [facility] for Alprazolam 0.25 mg tablet. Patient last seen 04/23/11.  Refill ok to refill per TD. Refill called in.

## 2011-09-25 NOTE — ED Notes (Signed)
MD at bedside. 

## 2011-09-25 NOTE — ED Notes (Addendum)
Pt and family aware that we are waiting on results.

## 2011-09-25 NOTE — ED Notes (Signed)
Pt here for chest pain, onset at home points to upper abd pain when occurs. Pt sts started 1 hour 45 mins ago. Unable to get ahold of md today so came here. Denies nauseat or vomiting no stroke symptoms noted but per her MD sts they are scared it is a stroke.

## 2011-09-25 NOTE — Telephone Encounter (Signed)
Refill request for Alprazolam. Patient last seen 04/23/11. Has an appointment pending for 10/22/11. Dr Kriste Basque please advise if ok to refill. Thanks.

## 2011-09-25 NOTE — Telephone Encounter (Signed)
Faxed refill request received for Alprazolam 0.25mg  tablet; ok to refill per Philipp Deputy. Refill called in to pharmacy

## 2011-09-26 ENCOUNTER — Other Ambulatory Visit (HOSPITAL_COMMUNITY): Payer: Self-pay | Admitting: Cardiology

## 2011-09-26 DIAGNOSIS — R079 Chest pain, unspecified: Secondary | ICD-10-CM

## 2011-09-26 NOTE — ED Provider Notes (Signed)
Medical screening examination/treatment/procedure(s) were conducted as a shared visit with non-physician practitioner(s) and myself.  I personally evaluated the patient during the encounter  Nonspecific chest pains, points to epigastrum. Has close followup with Harwani.   Charles B. Bernette Mayers, MD 09/26/11 [phone]

## 2011-09-27 ENCOUNTER — Encounter (HOSPITAL_COMMUNITY): Payer: Medicare Other

## 2011-09-30 ENCOUNTER — Encounter (HOSPITAL_COMMUNITY): Payer: Medicare Other

## 2011-09-30 NOTE — Telephone Encounter (Signed)
Refill phoned in to pharmacy 7/24

## 2011-10-02 ENCOUNTER — Encounter (HOSPITAL_COMMUNITY): Payer: Medicare Other

## 2011-10-04 ENCOUNTER — Encounter (HOSPITAL_COMMUNITY): Payer: Medicare Other

## 2011-10-04 DIAGNOSIS — K219 Gastro-esophageal reflux disease without esophagitis: Secondary | ICD-10-CM | POA: Insufficient documentation

## 2011-10-04 DIAGNOSIS — Z7982 Long term (current) use of aspirin: Secondary | ICD-10-CM | POA: Insufficient documentation

## 2011-10-04 DIAGNOSIS — I10 Essential (primary) hypertension: Secondary | ICD-10-CM | POA: Insufficient documentation

## 2011-10-04 DIAGNOSIS — M1990 Unspecified osteoarthritis, unspecified site: Secondary | ICD-10-CM | POA: Insufficient documentation

## 2011-10-04 DIAGNOSIS — Z79899 Other long term (current) drug therapy: Secondary | ICD-10-CM | POA: Insufficient documentation

## 2011-10-04 DIAGNOSIS — Z7902 Long term (current) use of antithrombotics/antiplatelets: Secondary | ICD-10-CM | POA: Insufficient documentation

## 2011-10-04 DIAGNOSIS — Z5189 Encounter for other specified aftercare: Secondary | ICD-10-CM | POA: Insufficient documentation

## 2011-10-04 DIAGNOSIS — I4891 Unspecified atrial fibrillation: Secondary | ICD-10-CM | POA: Insufficient documentation

## 2011-10-04 DIAGNOSIS — I2510 Atherosclerotic heart disease of native coronary artery without angina pectoris: Secondary | ICD-10-CM | POA: Insufficient documentation

## 2011-10-04 DIAGNOSIS — Z9861 Coronary angioplasty status: Secondary | ICD-10-CM | POA: Insufficient documentation

## 2011-10-04 DIAGNOSIS — I252 Old myocardial infarction: Secondary | ICD-10-CM | POA: Insufficient documentation

## 2011-10-04 DIAGNOSIS — I209 Angina pectoris, unspecified: Secondary | ICD-10-CM | POA: Insufficient documentation

## 2011-10-04 DIAGNOSIS — E7800 Pure hypercholesterolemia, unspecified: Secondary | ICD-10-CM | POA: Insufficient documentation

## 2011-10-07 ENCOUNTER — Encounter (HOSPITAL_COMMUNITY)
Admission: RE | Admit: 2011-10-07 | Discharge: 2011-10-07 | Disposition: A | Discharge status: Home / Self Care | Payer: Medicare Other | Source: Ambulatory Visit | Attending: Cardiology | Admitting: Cardiology

## 2011-10-07 NOTE — Progress Notes (Signed)
Pt returned to cardiac maintenance exercise program. Pt absent for six weeks due to travel and part time work.  Pt reported that she experienced some "fullness in her chest" that required  Pt to  Come into the ER for evaluation.  Per pt "they didn't find anything".  Pt was seen in the office by Dr. Sharyn Lull who she also reported "couldn't find anything". Pt also states that she has been overwhelmed the last couple of weeks having her great grandchildren from [place] in for a visit.  Pt is scheduled for a stress test on Tuesday morning.  Pt advised that she would not be able to exercise until she was cleared by the MD.  Pt plans to call when she knows the result.

## 2011-10-08 ENCOUNTER — Encounter (HOSPITAL_COMMUNITY)
Admission: RE | Admit: 2011-10-08 | Discharge: 2011-10-08 | Disposition: A | Discharge status: Home / Self Care | Payer: Medicare Other | Source: Ambulatory Visit | Attending: Cardiology | Admitting: Cardiology

## 2011-10-08 VITALS — BP 163/75 | HR 78

## 2011-10-08 DIAGNOSIS — R079 Chest pain, unspecified: Secondary | ICD-10-CM | POA: Insufficient documentation

## 2011-10-08 MED ORDER — TECHNETIUM TC 99M TETROFOSMIN IV KIT
10.0000 | PACK | Freq: Once | INTRAVENOUS | Status: AC | PRN
  Administered 2011-10-08: 10 via INTRAVENOUS

## 2011-10-08 MED ORDER — REGADENOSON 0.4 MG/5ML IV SOLN
INTRAVENOUS | Status: AC
  Filled 2011-10-08: qty 5

## 2011-10-08 MED ORDER — TECHNETIUM TC 99M TETROFOSMIN IV KIT
30.0000 | PACK | Freq: Once | INTRAVENOUS | Status: AC | PRN
  Administered 2011-10-08: 30 via INTRAVENOUS

## 2011-10-08 MED ORDER — REGADENOSON 0.4 MG/5ML IV SOLN
0.4000 mg | Freq: Once | INTRAVENOUS | Status: AC
  Administered 2011-10-08: 0.4 mg via INTRAVENOUS

## 2011-10-09 ENCOUNTER — Encounter (HOSPITAL_COMMUNITY): Payer: Medicare Other

## 2011-10-11 ENCOUNTER — Encounter (HOSPITAL_COMMUNITY): Admission: RE | Admit: 2011-10-11 | Payer: Medicare Other | Source: Ambulatory Visit

## 2011-10-14 ENCOUNTER — Encounter (HOSPITAL_COMMUNITY)
Admission: RE | Admit: 2011-10-14 | Discharge: 2011-10-14 | Disposition: A | Discharge status: Home / Self Care | Payer: Medicare Other | Source: Ambulatory Visit | Attending: Cardiology | Admitting: Cardiology

## 2011-10-16 ENCOUNTER — Encounter (HOSPITAL_COMMUNITY): Payer: Medicare Other

## 2011-10-18 ENCOUNTER — Encounter (HOSPITAL_COMMUNITY)
Admission: RE | Admit: 2011-10-18 | Discharge: 2011-10-18 | Disposition: A | Discharge status: Home / Self Care | Payer: Medicare Other | Source: Ambulatory Visit | Attending: Cardiology | Admitting: Cardiology

## 2011-10-21 ENCOUNTER — Encounter (HOSPITAL_COMMUNITY)
Admission: RE | Admit: 2011-10-21 | Discharge: 2011-10-21 | Disposition: A | Discharge status: Home / Self Care | Payer: Medicare Other | Source: Ambulatory Visit | Attending: Cardiology | Admitting: Cardiology

## 2011-10-22 ENCOUNTER — Ambulatory Visit (INDEPENDENT_AMBULATORY_CARE_PROVIDER_SITE_OTHER): Payer: Medicare Other | Admitting: Pulmonary Disease

## 2011-10-22 ENCOUNTER — Encounter: Payer: Self-pay | Admitting: Pulmonary Disease

## 2011-10-22 VITALS — BP 124/86 | HR 50 | Temp 96.8°F | Ht 62.0 in | Wt 174.0 lb

## 2011-10-22 DIAGNOSIS — E7800 Pure hypercholesterolemia, unspecified: Secondary | ICD-10-CM

## 2011-10-22 DIAGNOSIS — F411 Generalized anxiety disorder: Secondary | ICD-10-CM

## 2011-10-22 DIAGNOSIS — E079 Disorder of thyroid, unspecified: Secondary | ICD-10-CM

## 2011-10-22 DIAGNOSIS — D126 Benign neoplasm of colon, unspecified: Secondary | ICD-10-CM

## 2011-10-22 DIAGNOSIS — D649 Anemia, unspecified: Secondary | ICD-10-CM

## 2011-10-22 DIAGNOSIS — K449 Diaphragmatic hernia without obstruction or gangrene: Secondary | ICD-10-CM

## 2011-10-22 DIAGNOSIS — K5730 Diverticulosis of large intestine without perforation or abscess without bleeding: Secondary | ICD-10-CM

## 2011-10-22 DIAGNOSIS — I10 Essential (primary) hypertension: Secondary | ICD-10-CM

## 2011-10-22 DIAGNOSIS — M1990 Unspecified osteoarthritis, unspecified site: Secondary | ICD-10-CM

## 2011-10-22 DIAGNOSIS — I4892 Unspecified atrial flutter: Secondary | ICD-10-CM

## 2011-10-22 DIAGNOSIS — I739 Peripheral vascular disease, unspecified: Secondary | ICD-10-CM

## 2011-10-22 DIAGNOSIS — I2510 Atherosclerotic heart disease of native coronary artery without angina pectoris: Secondary | ICD-10-CM

## 2011-10-22 DIAGNOSIS — J45909 Unspecified asthma, uncomplicated: Secondary | ICD-10-CM

## 2011-10-22 MED ORDER — AMLODIPINE BESYLATE 5 MG PO TABS: 5.0000 mg | ORAL_TABLET | Freq: Every day | ORAL | Status: DC

## 2011-10-22 NOTE — Progress Notes (Signed)
Subjective:    Patient ID: Virginia Casey, female    DOB: 01/25/1925, 78 y.o.   MRN: [id number]  HPI 78 y/o BF here for a follow up visit... Virginia Casey has mult med problems as noted below...   ~  October 19, 2009:  Virginia Casey was hosp 7/29 - 10/04/09 by Maia Breslow for CP w/ Myoview showing ant ischemia, norm wall motion & EF= 82%;  cath showed 70% midLAD in-stent restenosis, otherw non-obstructive dis seen in other vessels;  subseq PTCA was successful & Virginia Casey was placed on PLAVIX x31mo (plus her ASA)...  her DC meds were changed to ASA/ PLAVIX, RAMIPRIL, IMDUR, AMIO, CRESTOR, PERCOCET...  we reviewed hosp records> CXR, Labs, etc (see below)...  Since hosp Virginia Casey tells me Virginia Casey stopped the Isosorbide on her own due to HA & "makes me sick";  since then her BP is sl higher ~150/90, & Virginia Casey has a call into DrHarwani's office...  ~  November 24, 2009:  add-on visit for cough x several days> states onset URI w/ cough, yellow sputum, low grade fever, congestion, and bilat rib pain from the cough... no hemoptysis, ch in dyspnea, angina CP, etc...  taking Tylenol, fluids, Robitussin... we discussed checking CXR (cardiomeg, HH, osteopenia, & mild basilar atx) & Rx w/ Depo80, Pred Dosepak, Augmentin, Mucinex, & Tramadol...  ~  April 18, 2010:    Virginia Casey reports a good 6 months- no new complaints or concerns;  Maia Breslow has her in Cardiac Rehab & Virginia Casey really like it- walking, biking, etc & no CP/ palpit/ SOB/ edema/ etc...    BP controlled & as prev noted DrHarwani makes freq changes in her meds & we don't have notes from his office (Virginia Casey reports off Imdur, off Plavix, on Amio 200mg - 1/2 daily);  Chol looks fabulous on Cres20 & Virginia Casey may indeed be able to decr to 10mg  dose (copy labs to [facility]);  GI stable on Pepcid, & as noted Virginia Casey is off the Plavix now);  her Hg is stable on the Fe & VitC;  only surprize finding is low TSH at 0.03 & we will contact pt to ret for further eval (Hyperthyroidism confirmed & Virginia Casey is on Amio> referred to [facility]-  treated w/ PTU 50mg  Tid, we don't have his notes to review)...   ~  Aug 01, 2010:  67mo ROV & post-hosp check> Virginia Casey was ADM 5/14 - 07/19/10 w/ refractory AB & Virginia Casey responded to IV & inhaled Rx being disch on Pred Dosepak, Mucinex, Hydromet;  Virginia Casey reports feeling much better, finished the Pred, & advised to use the Mucinex & Hydromet as needed...  Virginia Casey has already been back to see [facility] & Virginia Casey is off the ACE now on Losartan & Amlodipine for her BP & ASHD... [name] her hosp XRays and Labs...  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 "I've got twins coming next month in [place]"; Virginia Casey states that Virginia Casey stopped all her breathing meds "I'm doing fine" Virginia Casey says;  Virginia Casey further notes that DrKumar took her off her thyroid med (PTU) noting that her condition has cleared up off the Amio & back to baseline;  ~  April 23, 2011:  52mo ROV & Virginia Casey states that Virginia Casey is doing well, no new complaints or concerns today;  Virginia Casey continues to follow up w/ [facility] for Cards, and DrKumar for Endocrine;  [name] her Problem List, Meds, XRays, &  Labs today... Virginia Casey is very proud of being nominated for Mother of the Year thru her church...    AB> doing well, no resp infections or exacerbations, on no regular meds...    HBP> on Amlod5 & Losartan100; BP= 122/82 & Virginia Casey denies CP, palpit, SOB, edema; rec to continue same meds + diet...    CAD> followed by Maia Breslow; we do not have his notes; pt states Virginia Casey is doing fine & denies CP/ angina/ etc; Virginia Casey loves the cardiac rehab program...    PAFlutter> still on a low dose Amio per [facility]- 200mg tabs- taking 1/2 MWF; we do ot have records & rely on her hx & med list; Virginia Casey denies palpit, dizzy, SOB, etc...    Cerebrovasc dis> on ASA81; Virginia Casey denies cerebral ischemic symptoms...    Chol> on Cres20; FLP shows TChol 118, TG 42, HDL 60, LDL 50- looks great continue same...    Thyroid dis> followed by Aaron Edelman; Virginia Casey has  hx hyperthyroidism secondary to Amio & a prob multinod goiter; treated w/ Tapazole, then PTU but we do not have recent notes; Virginia Casey tells me that Virginia Casey is off the PTU & "everything is fine";     GI> HH, Divertics, Polyps> on PepcidBid    DJD> on Tylenol & VitD 1000u daily but Vit D level today is still low at 20 & Virginia Casey is asked to incr to 2000u/d...    Anxiety> on Alpraz0.25 prn which really helps...    Anemia> on Fe w/ VitC; Hg=13.1, MCV=78, Fe=66; rec to continue supplements... LABS 2/13:  FLP- great on Cres20;  Chems- ok w/ BUN=26 Creat=1.5;  CBC- wnl x MCV=78 Fe=66;  TSH=2.81;  VitD=20 & rec to incr supplement.  ~  October 22, 2011:  38mo ROV & Virginia Casey reports that Virginia Casey is stable- had some chest discomfort and went to the ER 7/13> no evid of ischemia & Virginia Casey had f/u [facility] who did a stress test which was neg...     [name] her medical problems & current medications and updated them below >> CXR 7/13 showed normal heart size, largeHH, bibasilar atx, otherw clear... EKG 7/13 showed SBrady, rate52, no acute changes... Myoview 8/13 at [facility] showed no ischemia or infarction, normal wall motion, EF=70%... LABS 7/13:  Chems- wnl;  CBC- wnl;      Problem List:  Hearing Loss >> Virginia Casey saw DrCrossley 3/13 w/ a high freq hearing loss & he removed creumen impactions' they are holding off on hearing aides for now...  ASTHMATIC BRONCHITIS (ICD-466.0) - breathing is stable now- s/p recent exacerbation... ~  CXR 1/12 showed basilar atx & large HH, cardiomeg, osteopenia, NAD... ~  Pr[facility] 5/12 w/ refractory AB exac & improved w/ standard therapy & brief course of Pred; CXR- similar, NAD...  HYPERTENSION (ICD-401.9) - now on LOSARTAN 100mg /d & AMLODIPINE 5mg /d (and off her prev Ramipril due to asthma & Imdur due to HA) ...  ~  8/12:  BP= 110/72 & not checking BP's at home; Virginia Casey denies visual changes, CP, palipit, syncope, dyspnea, edema, etc... ~  2/13:  BP= 122/82 & Virginia Casey is stable, mostly asymptomatic w/o CP, palpit,  SOB, etc... ~  8/13:  BP= 124/86 & Virginia Casey remains stable...  ATHEROSCLEROTIC HEART DISEASE (ICD-414.00) & PERIPHERAL VASCULAR DISEASE (ICD-443.9) - regularly on ASA 81mg /d, & off Plavix... followed by [facility] for Cards (we do not have his notes to review med changes)>> ~  hosp in Jan08 w/ syncope and MRI Brain showed mild atrophy & sm vessel dis;  MRA showed  basilar art narrowing & mod stenosis in RICA cavernous segment... CDopplers however were WNL w/ antegrade vertebral flow, and no signif ICA stenoses detected... ~  hosp Feb10 by [facility] w/ non-STEMI- s/p PTCA and stent in the LAD. ~  hosp 7/11 by [facility] w/ CP, Abn Myoview & cath w/ 70% midLAD in-stent resteosis; s/p PTCA & started back on Plavix... ~  Virginia Casey is in Cardiac Rehab & really likes it... ~  7/13:  Virginia Casey went to the ER w/ chest discomfort; ruled out for ischemia & followed up w/ DrHarwani> he did MYOVIEW 8/13 which was neg for ischemia or infarction, norm wall motion & EF=70%...  ATRIAL FLUTTER, PAROXYSMAL (ICD-427.32) - this occured 2/10 following her cath, PTCA, stent... he diagnosed flutter w/ 2:1 block... currently taking AMIODARONE 200mg - 1/2 tab/d...  HYPERCHOLESTEROLEMIA (ICD-272.0) - on CRESTOR 20mg /d... ~  FLP 11/08 showed TChol 161, TG 39, HDL 62, LDL 92... continue same med. ~  FLP 5/09 showed TChol 209, TG 70, HDL 74, LDL 124... rec- better diet, get weight down! ~  FLP in hosp 2/10 showed TChol 130, TG 69, HDL 45, LDL 71 ~  Virginia Casey reports that [facility] does fasting labs... ~  FLP 7/11 in hosp showed TChol 105, TG 30, HDL 66, LDL 33 ~  FLP 2/12 on Cres20 showed TChol 97, TG 38, HDL 50, LDL 39... copy to Cards ?decr to 10mg ? ~  FLP 2/13 on Cres20 showed TChol 118, TG 42, HDL 60, LDL 50... Continue same.  THYROID DISEASE >> Hx Amiodarone induced hyperthyroidism diagnosed 2/12 & referred to [facility], treated w/ PTU (now off) & DrHarwani tapered the Amio... ~  1/13:  Virginia Casey had f/u DrKumar> everything improve4d w/ reduction in  Amio dose; clinically euthyroid & no goiter palpated; TFT's in his office were WNL==> TSH=3.74,  FreeT3=2.4 (2.0-4.4), FreeT4=0.91 (0.61-1.12)  HIATAL HERNIA (ICD-553.3) - prev on Nexium & Carafate Liq- but changed to PEPCID 20mg Bid by [facility] due to need for Plavix... Virginia Casey has a giant HH measuring 10cm per DrSam, w/ erosions in the pouch... last EGD by DrSam 4/08... hernia is seen on her CXRs.  DIVERTICULOSIS OF COLON (ICD-562.10) & COLONIC POLYPS (ICD-211.3) ~  colonoscopy 11/06 showed divertics and 2- 4mm polyps... f/u planned in 4-70yrs... ~  [facility] 11/10 w/ lower GIB believed due to divertics... colonoscopy by DrJacobs showed severe divertics, no bleeding site, 7mm polyp removed...  DEGENERATIVE JOINT DISEASE (ICD-715.90) & HIP PAIN, RIGHT (ICD-719.45) - currently taking OTC meds only but DrDean rec Vicodin for as needed use (Virginia Casey prefers Tylenol)... Eval by DrDean 3/09 w/ bone-on-bone right hip, tried injection & pain meds... right THR done 12/09...  VITAMIN D DEFICIENCY (ICD-268.9) ~  labs 5/09 showed Vit D level = 9... pt started on 50K weekly but Virginia Casey switched on her own to 1000u/d OTC. ~  labs 2/12 showed Vit D level = 18... asked to incr Vit D supplement to 2000 u daily (Virginia Casey never did). ~  Labs 2/13 showed Vit D level = 20... Asked to finally incr VitD supplement to 2000u daily...  ANXIETY (ICD-300.00) - Virginia Casey is requesting a mild nerve pill & we have written ALPRAZOLAM 0.25mg  Prn use...  ANEMIA (ICD-285.9) - prev on Niferex 150mg /d, now FeSO4 325mg /d w/ Vit C 500mg ... Virginia Casey had an IDA from her giant HH and erosions... ~  last blood count 11/08 showed Hg=13.7, Fe=56... ~  labs 5/09 showed Hg= 14.2.[name]. ~  labs 2/10 in hosp showed Hg= 13 ~  [facility] 11/10 w/ lower GIB, &  Hg down to 8.6- tc 1u PC up to 9.3.Marland KitchenMarland Kitchen ~  labs 12/10 showed Hg= 10.6.[name]. rec> continue oral Fe w/ VitC... ~  [facility] labs 7/11 showed Hg= 11.6 - 10.5, MCV= 70... rec> restart Fe. ~  labs 2/12 showed Hg= 12.4, MCV= 77, Fe= 56  (sat=10%)... continue Fe w/ VitC. ~  Labs 5/12 in hosp showed Hg= 12.3, MCV= 68, & Virginia Casey continues on FeSO4 daily w/ VitC... ~  Labs 2/13 showed Hg=13.1, MCV=78, Fe=66; rec to continue supplements...  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   Past Surgical History  Procedure Date  . Total hip arthroplasty 12/09    Dr August Saucer  . Coronary angioplasty with stent placement 2/10    Dr Sharyn Lull  . Coronary angioplasty with stent placement 7/11    Dr Sharyn Lull    Outpatient Encounter Prescriptions as of 10/22/2011  Medication Sig Dispense Refill  . acetaminophen (TYLENOL) 325 MG tablet Take 325 mg by mouth every 6 (six) hours as needed. For pain      . ALPRAZolam (XANAX) 0.25 MG tablet Take 1 tablet (0.25 mg total) by mouth 3 (three) times daily as needed for sleep.  90 tablet  2  . amiodarone (PACERONE) 200 MG tablet Take 200 mg by mouth 2 (two) times daily. Take 1/2 tab by mouth on Monday, Wednesday and friday      . amLODipine (NORVASC) 5 MG tablet Take 5 mg by mouth daily.      [name] apixaban (ELIQUIS) 5 MG TABS tablet Take 5 mg by mouth 2 (two) times daily.      [name] aspirin 81 MG tablet Take 81 mg by mouth daily.       . Cholecalciferol (VITAMIN D) 1000 UNITS capsule Take 1,000 Units by mouth daily.        . famotidine (PEPCID) 20 MG tablet Take 1 tablet by mouth Twice daily.      . ferrous gluconate (FERGON) 325 MG tablet Take 325 mg by mouth daily with breakfast. With vitamin C tablet       . losartan (COZAAR) 100 MG tablet Take 1 tablet (100 mg total) by mouth daily.  30 tablet  11  . nitroGLYCERIN (NITROSTAT) 0.4 MG SL tablet Place 0.4 mg under the tongue every 5 (five) minutes as needed. May repeat x3       . rosuvastatin (CRESTOR) 20 MG tablet Take 20 mg by mouth daily.       . vitamin C (ASCORBIC ACID) 500 MG tablet Take 500 mg by mouth daily. With iron tablet         No Known Allergies   Current Medications, Allergies,  Past Medical History, Past Surgical History, Family History, and Social History were reviewed in [employer] record.   Review of Systems         See HPI - all other systems neg except as noted... The patient complains of dyspnea on exertion.  The patient denies anorexia, fever, weight loss, weight gain, vision loss, decreased hearing, hoarseness, chest pain, syncope, peripheral edema, prolonged cough, headaches, hemoptysis, abdominal pain, melena, hematochezia, severe indigestion/heartburn, hematuria, incontinence, muscle weakness, suspicious skin lesions, transient blindness, difficulty walking, depression, unusual weight change, abnormal bleeding, enlarged lymph nodes, and angioedema.     Objective:   Physical Exam     WD, WN, 78 y/o BF in NAD... GENERAL:  Alert & oriented; pleasant & cooperative... HEENT:  [place]/AT,  EOM-wnl, EACs-clear, TMs-wnl, NOSE-clear, THROAT-clear & wnl. NECK:  Supple w/ fairROM; no JVD; normal carotid impulses w/o bruits; no thyromegaly or nodules palpated; no lymphadenopathy. CHEST:  Coarse BS w/ no wheezing, no rales, no signs of consolidation. HEART:  Regular Rhythm; gr 1/6 SEM without rubs or gallops detected... ABDOMEN:  Soft & nontender; normal bowel sounds; no organomegaly or masses palpated... EXT: without deformities, mod arthritic changes; s/p right THR; no varicose veins/ +venous insuffic/  tr edema . BACK:  sl tender along right PSIS area... NEURO:  CN's intact; no focal neuro defict... DERM:  no lesions seen...  RADIOLOGY DATA:  Reviewed in the EPIC EMR & discussed w/ the patient...    >>CXR 5/12 showed mod sized HH, stable cardiomeg, bibasilar atx, osteopenia, NAD...  LABORATORY DATA:  Reviewed in the EPIC EMR & discussed w/ the patient...    >>LABS 2/13:  FLP- great on Cres20;  Chems- ok w/ BUN=26 Creat=1.5;  CBC- wnl x MCV=78 Fe=66;  TSH=2.81;  VitD=20 & rec to incr supplement.   Assessment & Plan:    AB>  Improved  overall & Virginia Casey stopped all meds on her own...   HBP>  Well controlled on the Losartan & Amlodipine; asked to monitor BP at home...  ASHD>  Followed by [facility] for Cards...  AFlutter>  DrHarwani has weaned the Amio to 1/2 tab; her prev thyroid problems have resolved...  CHOL>  On Cres20 w/ good control of FLP...  THYROID DISEASE>  Prev Amio induced hyperthyroidism managed by DrKumar (on PTU prev) & resolved on lower dose of Amio...  GI>  Hx HH/ GERD/ Divertics/ Polyps>  Virginia Casey is followed by DrJacobs for GI, continue H2blocker Rx...  DJD>  Virginia Casey is followed by DrDean for Ortho...  Anemia>  Hg is OK but MCV is low, Fe=66 & Virginia Casey will continue her Fe & VitC supplements...  Anxiety>  Requesting a mild nerve pill & we wrote for Alprazolam...   Patient's Medications  New Prescriptions   No medications on file  Previous Medications   ACETAMINOPHEN (TYLENOL) 325 MG TABLET    Take 325 mg by mouth every 6 (six) hours as needed. For pain   ALPRAZOLAM (XANAX) 0.25 MG TABLET    Take 1 tablet (0.25 mg total) by mouth 3 (three) times daily as needed for sleep.   AMIODARONE (PACERONE) 200 MG TABLET    Take 200 mg by mouth 2 (two) times daily. Take 1/2 tab by mouth on Monday, Wednesday and friday   APIXABAN (ELIQUIS) 5 MG TABS TABLET    Take 5 mg by mouth 2 (two) times daily.   ASPIRIN 81 MG TABLET    Take 81 mg by mouth daily.    CHOLECALCIFEROL (VITAMIN D) 1000 UNITS CAPSULE    Take 1,000 Units by mouth daily.     FAMOTIDINE (PEPCID) 20 MG TABLET    Take 1 tablet by mouth Twice daily.   FERROUS GLUCONATE (FERGON) 325 MG TABLET    Take 325 mg by mouth daily with breakfast. With vitamin C tablet    LOSARTAN (COZAAR) 100 MG TABLET    Take 1 tablet (100 mg total) by mouth daily.   NITROGLYCERIN (NITROSTAT) 0.4 MG SL TABLET    Place 0.4 mg under the tongue every 5 (five) minutes as needed. May repeat x3    ROSUVASTATIN (CRESTOR) 20 MG TABLET    Take 20 mg by mouth daily.    VITAMIN C (ASCORBIC ACID) 500 MG  TABLET    Take  500 mg by mouth daily. With iron tablet   Modified Medications   Modified Medication Previous Medication   AMLODIPINE (NORVASC) 5 MG TABLET amLODipine (NORVASC) 5 MG tablet      Take 1 tablet (5 mg total) by mouth daily.    Take 5 mg by mouth daily.  Discontinued Medications   No medications on file

## 2011-10-22 NOTE — Patient Instructions (Addendum)
Today we updated your med list in our EPIC system...    Continue your current medications the same...  We refilled your NORVASC (Amlodipine) as requested...  [name] your recent ER visit XRays and labs...  Call for any problems...  Let's plan on another follow up visit in 6 months w/ FASTING blood work at that time.Marland KitchenMarland Kitchen

## 2011-10-23 ENCOUNTER — Encounter (HOSPITAL_COMMUNITY)
Admission: RE | Admit: 2011-10-23 | Discharge: 2011-10-23 | Disposition: A | Discharge status: Home / Self Care | Payer: Medicare Other | Source: Ambulatory Visit | Attending: Cardiology | Admitting: Cardiology

## 2011-10-25 ENCOUNTER — Encounter (HOSPITAL_COMMUNITY): Payer: Medicare Other

## 2011-10-28 ENCOUNTER — Encounter (HOSPITAL_COMMUNITY): Payer: Medicare Other

## 2011-10-30 ENCOUNTER — Encounter (HOSPITAL_COMMUNITY)
Admission: RE | Admit: 2011-10-30 | Discharge: 2011-10-30 | Disposition: A | Discharge status: Home / Self Care | Payer: Medicare Other | Source: Ambulatory Visit | Attending: Cardiology | Admitting: Cardiology

## 2011-11-01 ENCOUNTER — Encounter (HOSPITAL_COMMUNITY): Payer: Medicare Other

## 2011-11-04 ENCOUNTER — Encounter (HOSPITAL_COMMUNITY): Payer: Medicare Other

## 2011-11-06 ENCOUNTER — Encounter (HOSPITAL_COMMUNITY)
Admission: RE | Admit: 2011-11-06 | Discharge: 2011-11-06 | Disposition: A | Discharge status: Home / Self Care | Payer: Medicare Other | Source: Ambulatory Visit | Attending: Cardiology | Admitting: Cardiology

## 2011-11-06 DIAGNOSIS — I10 Essential (primary) hypertension: Secondary | ICD-10-CM | POA: Insufficient documentation

## 2011-11-06 DIAGNOSIS — Z79899 Other long term (current) drug therapy: Secondary | ICD-10-CM | POA: Insufficient documentation

## 2011-11-06 DIAGNOSIS — Z7902 Long term (current) use of antithrombotics/antiplatelets: Secondary | ICD-10-CM | POA: Insufficient documentation

## 2011-11-06 DIAGNOSIS — M1990 Unspecified osteoarthritis, unspecified site: Secondary | ICD-10-CM | POA: Insufficient documentation

## 2011-11-06 DIAGNOSIS — Z9861 Coronary angioplasty status: Secondary | ICD-10-CM | POA: Insufficient documentation

## 2011-11-06 DIAGNOSIS — I4891 Unspecified atrial fibrillation: Secondary | ICD-10-CM | POA: Insufficient documentation

## 2011-11-06 DIAGNOSIS — I2510 Atherosclerotic heart disease of native coronary artery without angina pectoris: Secondary | ICD-10-CM | POA: Insufficient documentation

## 2011-11-06 DIAGNOSIS — Z5189 Encounter for other specified aftercare: Secondary | ICD-10-CM | POA: Insufficient documentation

## 2011-11-06 DIAGNOSIS — E7800 Pure hypercholesterolemia, unspecified: Secondary | ICD-10-CM | POA: Insufficient documentation

## 2011-11-06 DIAGNOSIS — Z7982 Long term (current) use of aspirin: Secondary | ICD-10-CM | POA: Insufficient documentation

## 2011-11-06 DIAGNOSIS — I252 Old myocardial infarction: Secondary | ICD-10-CM | POA: Insufficient documentation

## 2011-11-06 DIAGNOSIS — K219 Gastro-esophageal reflux disease without esophagitis: Secondary | ICD-10-CM | POA: Insufficient documentation

## 2011-11-06 DIAGNOSIS — I209 Angina pectoris, unspecified: Secondary | ICD-10-CM | POA: Insufficient documentation

## 2011-11-08 ENCOUNTER — Encounter (HOSPITAL_COMMUNITY): Payer: Medicare Other

## 2011-11-11 ENCOUNTER — Encounter (HOSPITAL_COMMUNITY): Payer: Medicare Other

## 2011-11-13 ENCOUNTER — Encounter (HOSPITAL_COMMUNITY): Payer: Medicare Other

## 2011-11-15 ENCOUNTER — Encounter (HOSPITAL_COMMUNITY): Payer: Medicare Other

## 2011-11-18 ENCOUNTER — Encounter (HOSPITAL_COMMUNITY): Payer: Medicare Other

## 2011-11-20 ENCOUNTER — Encounter (HOSPITAL_COMMUNITY)
Admission: RE | Admit: 2011-11-20 | Discharge: 2011-11-20 | Disposition: A | Discharge status: Home / Self Care | Payer: Medicare Other | Source: Ambulatory Visit | Attending: Cardiology | Admitting: Cardiology

## 2011-11-22 ENCOUNTER — Encounter (HOSPITAL_COMMUNITY)
Admission: RE | Admit: 2011-11-22 | Discharge: 2011-11-22 | Disposition: A | Discharge status: Home / Self Care | Payer: Medicare Other | Source: Ambulatory Visit | Attending: Cardiology | Admitting: Cardiology

## 2011-11-25 ENCOUNTER — Encounter (HOSPITAL_COMMUNITY): Payer: Medicare Other

## 2011-11-27 ENCOUNTER — Encounter (HOSPITAL_COMMUNITY)
Admission: RE | Admit: 2011-11-27 | Discharge: 2011-11-27 | Disposition: A | Discharge status: Home / Self Care | Payer: Medicare Other | Source: Ambulatory Visit | Attending: Cardiology | Admitting: Cardiology

## 2011-11-29 ENCOUNTER — Encounter (HOSPITAL_COMMUNITY): Payer: Medicare Other

## 2011-12-02 ENCOUNTER — Encounter (HOSPITAL_COMMUNITY): Payer: Medicare Other

## 2011-12-04 ENCOUNTER — Encounter (HOSPITAL_COMMUNITY)
Admission: RE | Admit: 2011-12-04 | Discharge: 2011-12-04 | Disposition: A | Discharge status: Home / Self Care | Payer: Medicare Other | Source: Ambulatory Visit | Attending: Cardiology | Admitting: Cardiology

## 2011-12-04 DIAGNOSIS — Z7902 Long term (current) use of antithrombotics/antiplatelets: Secondary | ICD-10-CM | POA: Insufficient documentation

## 2011-12-04 DIAGNOSIS — Z79899 Other long term (current) drug therapy: Secondary | ICD-10-CM | POA: Insufficient documentation

## 2011-12-04 DIAGNOSIS — M1990 Unspecified osteoarthritis, unspecified site: Secondary | ICD-10-CM | POA: Insufficient documentation

## 2011-12-04 DIAGNOSIS — Z7982 Long term (current) use of aspirin: Secondary | ICD-10-CM | POA: Insufficient documentation

## 2011-12-04 DIAGNOSIS — I252 Old myocardial infarction: Secondary | ICD-10-CM | POA: Insufficient documentation

## 2011-12-04 DIAGNOSIS — Z5189 Encounter for other specified aftercare: Secondary | ICD-10-CM | POA: Insufficient documentation

## 2011-12-04 DIAGNOSIS — E7800 Pure hypercholesterolemia, unspecified: Secondary | ICD-10-CM | POA: Insufficient documentation

## 2011-12-04 DIAGNOSIS — I209 Angina pectoris, unspecified: Secondary | ICD-10-CM | POA: Insufficient documentation

## 2011-12-04 DIAGNOSIS — I4891 Unspecified atrial fibrillation: Secondary | ICD-10-CM | POA: Insufficient documentation

## 2011-12-04 DIAGNOSIS — I2510 Atherosclerotic heart disease of native coronary artery without angina pectoris: Secondary | ICD-10-CM | POA: Insufficient documentation

## 2011-12-04 DIAGNOSIS — K219 Gastro-esophageal reflux disease without esophagitis: Secondary | ICD-10-CM | POA: Insufficient documentation

## 2011-12-04 DIAGNOSIS — I10 Essential (primary) hypertension: Secondary | ICD-10-CM | POA: Insufficient documentation

## 2011-12-04 DIAGNOSIS — Z9861 Coronary angioplasty status: Secondary | ICD-10-CM | POA: Insufficient documentation

## 2011-12-06 ENCOUNTER — Encounter (HOSPITAL_COMMUNITY): Payer: Medicare Other

## 2011-12-09 ENCOUNTER — Encounter (HOSPITAL_COMMUNITY)
Admission: RE | Admit: 2011-12-09 | Discharge: 2011-12-09 | Disposition: A | Discharge status: Home / Self Care | Payer: Medicare Other | Source: Ambulatory Visit | Attending: Cardiology | Admitting: Cardiology

## 2011-12-11 ENCOUNTER — Encounter (HOSPITAL_COMMUNITY)
Admission: RE | Admit: 2011-12-11 | Discharge: 2011-12-11 | Disposition: A | Discharge status: Home / Self Care | Payer: Medicare Other | Source: Ambulatory Visit | Attending: Cardiology | Admitting: Cardiology

## 2011-12-13 ENCOUNTER — Encounter (HOSPITAL_COMMUNITY)
Admission: RE | Admit: 2011-12-13 | Discharge: 2011-12-13 | Disposition: A | Discharge status: Home / Self Care | Payer: Medicare Other | Source: Ambulatory Visit | Attending: Cardiology | Admitting: Cardiology

## 2011-12-16 ENCOUNTER — Encounter (HOSPITAL_COMMUNITY): Payer: Medicare Other

## 2011-12-18 ENCOUNTER — Encounter (HOSPITAL_COMMUNITY): Payer: Medicare Other

## 2011-12-20 ENCOUNTER — Encounter (HOSPITAL_COMMUNITY): Payer: Medicare Other

## 2011-12-23 ENCOUNTER — Encounter (HOSPITAL_COMMUNITY)
Admission: RE | Admit: 2011-12-23 | Discharge: 2011-12-23 | Disposition: A | Discharge status: Home / Self Care | Payer: Medicare Other | Source: Ambulatory Visit | Attending: Cardiology | Admitting: Cardiology

## 2011-12-25 ENCOUNTER — Encounter (HOSPITAL_COMMUNITY): Payer: Medicare Other

## 2011-12-27 ENCOUNTER — Encounter (HOSPITAL_COMMUNITY): Payer: Medicare Other

## 2011-12-30 ENCOUNTER — Encounter (HOSPITAL_COMMUNITY)
Admission: RE | Admit: 2011-12-30 | Discharge: 2011-12-30 | Disposition: A | Discharge status: Home / Self Care | Payer: Medicare Other | Source: Ambulatory Visit | Attending: Cardiology | Admitting: Cardiology

## 2012-01-01 ENCOUNTER — Encounter (HOSPITAL_COMMUNITY)
Admission: RE | Admit: 2012-01-01 | Discharge: 2012-01-01 | Disposition: A | Discharge status: Home / Self Care | Payer: Medicare Other | Source: Ambulatory Visit | Attending: Cardiology | Admitting: Cardiology

## 2012-01-03 ENCOUNTER — Encounter (HOSPITAL_COMMUNITY): Payer: Medicare Other

## 2012-01-03 DIAGNOSIS — K219 Gastro-esophageal reflux disease without esophagitis: Secondary | ICD-10-CM | POA: Insufficient documentation

## 2012-01-03 DIAGNOSIS — I2510 Atherosclerotic heart disease of native coronary artery without angina pectoris: Secondary | ICD-10-CM | POA: Insufficient documentation

## 2012-01-03 DIAGNOSIS — I10 Essential (primary) hypertension: Secondary | ICD-10-CM | POA: Insufficient documentation

## 2012-01-03 DIAGNOSIS — M1990 Unspecified osteoarthritis, unspecified site: Secondary | ICD-10-CM | POA: Insufficient documentation

## 2012-01-03 DIAGNOSIS — Z79899 Other long term (current) drug therapy: Secondary | ICD-10-CM | POA: Insufficient documentation

## 2012-01-03 DIAGNOSIS — I209 Angina pectoris, unspecified: Secondary | ICD-10-CM | POA: Insufficient documentation

## 2012-01-03 DIAGNOSIS — E7800 Pure hypercholesterolemia, unspecified: Secondary | ICD-10-CM | POA: Insufficient documentation

## 2012-01-03 DIAGNOSIS — I4891 Unspecified atrial fibrillation: Secondary | ICD-10-CM | POA: Insufficient documentation

## 2012-01-03 DIAGNOSIS — I252 Old myocardial infarction: Secondary | ICD-10-CM | POA: Insufficient documentation

## 2012-01-03 DIAGNOSIS — Z7902 Long term (current) use of antithrombotics/antiplatelets: Secondary | ICD-10-CM | POA: Insufficient documentation

## 2012-01-03 DIAGNOSIS — Z9861 Coronary angioplasty status: Secondary | ICD-10-CM | POA: Insufficient documentation

## 2012-01-03 DIAGNOSIS — Z5189 Encounter for other specified aftercare: Secondary | ICD-10-CM | POA: Insufficient documentation

## 2012-01-03 DIAGNOSIS — Z7982 Long term (current) use of aspirin: Secondary | ICD-10-CM | POA: Insufficient documentation

## 2012-01-06 ENCOUNTER — Encounter (HOSPITAL_COMMUNITY)
Admission: RE | Admit: 2012-01-06 | Discharge: 2012-01-06 | Disposition: A | Discharge status: Home / Self Care | Payer: Medicare Other | Source: Ambulatory Visit | Attending: Cardiology | Admitting: Cardiology

## 2012-01-08 ENCOUNTER — Encounter (HOSPITAL_COMMUNITY)
Admission: RE | Admit: 2012-01-08 | Discharge: 2012-01-08 | Disposition: A | Discharge status: Home / Self Care | Payer: Medicare Other | Source: Ambulatory Visit | Attending: Cardiology | Admitting: Cardiology

## 2012-01-10 ENCOUNTER — Encounter (HOSPITAL_COMMUNITY): Payer: Medicare Other

## 2012-01-13 ENCOUNTER — Encounter (HOSPITAL_COMMUNITY): Payer: Medicare Other

## 2012-01-15 ENCOUNTER — Encounter (HOSPITAL_COMMUNITY): Payer: Medicare Other

## 2012-01-17 ENCOUNTER — Encounter (HOSPITAL_COMMUNITY): Payer: Medicare Other

## 2012-01-20 ENCOUNTER — Encounter (HOSPITAL_COMMUNITY): Payer: Medicare Other

## 2012-01-22 ENCOUNTER — Encounter (HOSPITAL_COMMUNITY): Payer: Medicare Other

## 2012-01-24 ENCOUNTER — Encounter (HOSPITAL_COMMUNITY): Payer: Medicare Other

## 2012-01-27 ENCOUNTER — Encounter (HOSPITAL_COMMUNITY): Payer: Medicare Other

## 2012-01-29 ENCOUNTER — Encounter (HOSPITAL_COMMUNITY): Payer: Medicare Other

## 2012-01-31 ENCOUNTER — Encounter (HOSPITAL_COMMUNITY): Payer: Medicare Other

## 2012-02-03 ENCOUNTER — Encounter (HOSPITAL_COMMUNITY): Payer: Medicare Other

## 2012-02-03 DIAGNOSIS — E7800 Pure hypercholesterolemia, unspecified: Secondary | ICD-10-CM | POA: Insufficient documentation

## 2012-02-03 DIAGNOSIS — M1990 Unspecified osteoarthritis, unspecified site: Secondary | ICD-10-CM | POA: Insufficient documentation

## 2012-02-03 DIAGNOSIS — I4891 Unspecified atrial fibrillation: Secondary | ICD-10-CM | POA: Insufficient documentation

## 2012-02-03 DIAGNOSIS — K219 Gastro-esophageal reflux disease without esophagitis: Secondary | ICD-10-CM | POA: Insufficient documentation

## 2012-02-03 DIAGNOSIS — I2510 Atherosclerotic heart disease of native coronary artery without angina pectoris: Secondary | ICD-10-CM | POA: Insufficient documentation

## 2012-02-03 DIAGNOSIS — Z9861 Coronary angioplasty status: Secondary | ICD-10-CM | POA: Insufficient documentation

## 2012-02-03 DIAGNOSIS — Z7902 Long term (current) use of antithrombotics/antiplatelets: Secondary | ICD-10-CM | POA: Insufficient documentation

## 2012-02-03 DIAGNOSIS — I252 Old myocardial infarction: Secondary | ICD-10-CM | POA: Insufficient documentation

## 2012-02-03 DIAGNOSIS — Z7982 Long term (current) use of aspirin: Secondary | ICD-10-CM | POA: Insufficient documentation

## 2012-02-03 DIAGNOSIS — I10 Essential (primary) hypertension: Secondary | ICD-10-CM | POA: Insufficient documentation

## 2012-02-03 DIAGNOSIS — Z5189 Encounter for other specified aftercare: Secondary | ICD-10-CM | POA: Insufficient documentation

## 2012-02-03 DIAGNOSIS — Z79899 Other long term (current) drug therapy: Secondary | ICD-10-CM | POA: Insufficient documentation

## 2012-02-03 DIAGNOSIS — I209 Angina pectoris, unspecified: Secondary | ICD-10-CM | POA: Insufficient documentation

## 2012-02-05 ENCOUNTER — Encounter (HOSPITAL_COMMUNITY): Payer: Medicare Other

## 2012-02-07 ENCOUNTER — Encounter (HOSPITAL_COMMUNITY): Payer: Medicare Other

## 2012-02-10 ENCOUNTER — Encounter (HOSPITAL_COMMUNITY)
Admission: RE | Admit: 2012-02-10 | Discharge: 2012-02-10 | Disposition: A | Discharge status: Home / Self Care | Payer: Medicare Other | Source: Ambulatory Visit | Attending: Cardiology | Admitting: Cardiology

## 2012-02-12 ENCOUNTER — Encounter (HOSPITAL_COMMUNITY)
Admission: RE | Admit: 2012-02-12 | Discharge: 2012-02-12 | Disposition: A | Discharge status: Home / Self Care | Payer: Medicare Other | Source: Ambulatory Visit | Attending: Cardiology | Admitting: Cardiology

## 2012-02-14 ENCOUNTER — Encounter (HOSPITAL_COMMUNITY): Payer: Medicare Other

## 2012-02-17 ENCOUNTER — Encounter (HOSPITAL_COMMUNITY)
Admission: RE | Admit: 2012-02-17 | Discharge: 2012-02-17 | Disposition: A | Discharge status: Home / Self Care | Payer: Medicare Other | Source: Ambulatory Visit | Attending: Cardiology | Admitting: Cardiology

## 2012-02-19 ENCOUNTER — Encounter (HOSPITAL_COMMUNITY)
Admission: RE | Admit: 2012-02-19 | Discharge: 2012-02-19 | Disposition: A | Discharge status: Home / Self Care | Payer: Medicare Other | Source: Ambulatory Visit | Attending: Cardiology | Admitting: Cardiology

## 2012-02-21 ENCOUNTER — Encounter (HOSPITAL_COMMUNITY): Payer: Medicare Other

## 2012-02-24 ENCOUNTER — Encounter (HOSPITAL_COMMUNITY): Payer: Medicare Other

## 2012-02-26 ENCOUNTER — Encounter (HOSPITAL_COMMUNITY): Payer: Medicare Other

## 2012-02-28 ENCOUNTER — Encounter (HOSPITAL_COMMUNITY): Payer: Medicare Other

## 2012-03-02 ENCOUNTER — Encounter (HOSPITAL_COMMUNITY): Payer: Medicare Other

## 2012-03-04 ENCOUNTER — Encounter (HOSPITAL_COMMUNITY): Payer: Medicare Other

## 2012-03-06 ENCOUNTER — Encounter (HOSPITAL_COMMUNITY): Admission: RE | Admit: 2012-03-06 | Payer: Medicare Other | Source: Ambulatory Visit

## 2012-03-06 DIAGNOSIS — I2510 Atherosclerotic heart disease of native coronary artery without angina pectoris: Secondary | ICD-10-CM | POA: Insufficient documentation

## 2012-03-06 DIAGNOSIS — I252 Old myocardial infarction: Secondary | ICD-10-CM | POA: Insufficient documentation

## 2012-03-06 DIAGNOSIS — M1990 Unspecified osteoarthritis, unspecified site: Secondary | ICD-10-CM | POA: Insufficient documentation

## 2012-03-06 DIAGNOSIS — Z79899 Other long term (current) drug therapy: Secondary | ICD-10-CM | POA: Insufficient documentation

## 2012-03-06 DIAGNOSIS — Z7902 Long term (current) use of antithrombotics/antiplatelets: Secondary | ICD-10-CM | POA: Insufficient documentation

## 2012-03-06 DIAGNOSIS — Z5189 Encounter for other specified aftercare: Secondary | ICD-10-CM | POA: Insufficient documentation

## 2012-03-06 DIAGNOSIS — I4891 Unspecified atrial fibrillation: Secondary | ICD-10-CM | POA: Insufficient documentation

## 2012-03-06 DIAGNOSIS — Z9861 Coronary angioplasty status: Secondary | ICD-10-CM | POA: Insufficient documentation

## 2012-03-06 DIAGNOSIS — K219 Gastro-esophageal reflux disease without esophagitis: Secondary | ICD-10-CM | POA: Insufficient documentation

## 2012-03-06 DIAGNOSIS — I209 Angina pectoris, unspecified: Secondary | ICD-10-CM | POA: Insufficient documentation

## 2012-03-06 DIAGNOSIS — E7800 Pure hypercholesterolemia, unspecified: Secondary | ICD-10-CM | POA: Insufficient documentation

## 2012-03-06 DIAGNOSIS — I10 Essential (primary) hypertension: Secondary | ICD-10-CM | POA: Insufficient documentation

## 2012-03-06 DIAGNOSIS — Z7982 Long term (current) use of aspirin: Secondary | ICD-10-CM | POA: Insufficient documentation

## 2012-03-09 ENCOUNTER — Encounter (HOSPITAL_COMMUNITY)
Admission: RE | Admit: 2012-03-09 | Discharge: 2012-03-09 | Disposition: A | Discharge status: Home / Self Care | Payer: Medicare Other | Source: Ambulatory Visit | Attending: Cardiology | Admitting: Cardiology

## 2012-03-11 ENCOUNTER — Encounter (HOSPITAL_COMMUNITY): Payer: Medicare Other

## 2012-03-13 ENCOUNTER — Encounter (HOSPITAL_COMMUNITY): Payer: Medicare Other

## 2012-03-16 ENCOUNTER — Encounter (HOSPITAL_COMMUNITY)
Admission: RE | Admit: 2012-03-16 | Discharge: 2012-03-16 | Disposition: A | Discharge status: Home / Self Care | Payer: Medicare Other | Source: Ambulatory Visit | Attending: Cardiology | Admitting: Cardiology

## 2012-03-18 ENCOUNTER — Encounter (HOSPITAL_COMMUNITY): Payer: Medicare Other

## 2012-03-20 ENCOUNTER — Encounter (HOSPITAL_COMMUNITY): Payer: Medicare Other

## 2012-03-23 ENCOUNTER — Encounter (HOSPITAL_COMMUNITY): Payer: Medicare Other

## 2012-03-25 ENCOUNTER — Encounter (HOSPITAL_COMMUNITY): Payer: Medicare Other

## 2012-03-27 ENCOUNTER — Encounter (HOSPITAL_COMMUNITY): Payer: Medicare Other

## 2012-03-30 ENCOUNTER — Encounter (HOSPITAL_COMMUNITY)
Admission: RE | Admit: 2012-03-30 | Discharge: 2012-03-30 | Disposition: A | Discharge status: Home / Self Care | Payer: Medicare Other | Source: Ambulatory Visit | Attending: Cardiology | Admitting: Cardiology

## 2012-03-31 ENCOUNTER — Other Ambulatory Visit: Payer: Self-pay | Admitting: Pulmonary Disease

## 2012-04-01 ENCOUNTER — Encounter (HOSPITAL_COMMUNITY): Payer: Medicare Other

## 2012-04-03 ENCOUNTER — Encounter (HOSPITAL_COMMUNITY): Payer: Medicare Other

## 2012-04-06 ENCOUNTER — Encounter (HOSPITAL_COMMUNITY): Payer: Self-pay

## 2012-04-06 DIAGNOSIS — I252 Old myocardial infarction: Secondary | ICD-10-CM | POA: Insufficient documentation

## 2012-04-06 DIAGNOSIS — Z9861 Coronary angioplasty status: Secondary | ICD-10-CM | POA: Insufficient documentation

## 2012-04-06 DIAGNOSIS — Z5189 Encounter for other specified aftercare: Secondary | ICD-10-CM | POA: Insufficient documentation

## 2012-04-06 DIAGNOSIS — I4891 Unspecified atrial fibrillation: Secondary | ICD-10-CM | POA: Insufficient documentation

## 2012-04-06 DIAGNOSIS — I2510 Atherosclerotic heart disease of native coronary artery without angina pectoris: Secondary | ICD-10-CM | POA: Insufficient documentation

## 2012-04-06 DIAGNOSIS — I209 Angina pectoris, unspecified: Secondary | ICD-10-CM | POA: Insufficient documentation

## 2012-04-08 ENCOUNTER — Encounter (HOSPITAL_COMMUNITY): Payer: Self-pay

## 2012-04-10 ENCOUNTER — Encounter (HOSPITAL_COMMUNITY): Payer: Self-pay

## 2012-04-13 ENCOUNTER — Encounter (HOSPITAL_COMMUNITY)
Admission: RE | Admit: 2012-04-13 | Discharge: 2012-04-13 | Disposition: A | Discharge status: Home / Self Care | Payer: Self-pay | Source: Ambulatory Visit | Attending: Cardiology | Admitting: Cardiology

## 2012-04-15 ENCOUNTER — Encounter (HOSPITAL_COMMUNITY): Payer: Self-pay

## 2012-04-17 ENCOUNTER — Encounter (HOSPITAL_COMMUNITY): Payer: Self-pay

## 2012-04-20 ENCOUNTER — Encounter (HOSPITAL_COMMUNITY)
Admission: RE | Admit: 2012-04-20 | Discharge: 2012-04-20 | Disposition: A | Discharge status: Home / Self Care | Payer: Self-pay | Source: Ambulatory Visit | Attending: Cardiology | Admitting: Cardiology

## 2012-04-22 ENCOUNTER — Encounter (HOSPITAL_COMMUNITY): Payer: Self-pay

## 2012-04-23 ENCOUNTER — Ambulatory Visit: Payer: Medicare Other | Admitting: Pulmonary Disease

## 2012-04-24 ENCOUNTER — Encounter (HOSPITAL_COMMUNITY)
Admission: RE | Admit: 2012-04-24 | Discharge: 2012-04-24 | Disposition: A | Discharge status: Home / Self Care | Payer: Self-pay | Source: Ambulatory Visit | Attending: Cardiology | Admitting: Cardiology

## 2012-04-27 ENCOUNTER — Encounter (HOSPITAL_COMMUNITY)
Admission: RE | Admit: 2012-04-27 | Discharge: 2012-04-27 | Disposition: A | Discharge status: Home / Self Care | Payer: Self-pay | Source: Ambulatory Visit | Attending: Cardiology | Admitting: Cardiology

## 2012-04-29 ENCOUNTER — Encounter (HOSPITAL_COMMUNITY): Payer: Self-pay

## 2012-05-01 ENCOUNTER — Encounter (HOSPITAL_COMMUNITY): Payer: Self-pay

## 2012-05-04 ENCOUNTER — Encounter (HOSPITAL_COMMUNITY)
Admission: RE | Admit: 2012-05-04 | Discharge: 2012-05-04 | Disposition: A | Discharge status: Home / Self Care | Payer: Self-pay | Source: Ambulatory Visit | Attending: Cardiology | Admitting: Cardiology

## 2012-05-04 DIAGNOSIS — Z5189 Encounter for other specified aftercare: Secondary | ICD-10-CM | POA: Insufficient documentation

## 2012-05-04 DIAGNOSIS — I2510 Atherosclerotic heart disease of native coronary artery without angina pectoris: Secondary | ICD-10-CM | POA: Insufficient documentation

## 2012-05-04 DIAGNOSIS — I252 Old myocardial infarction: Secondary | ICD-10-CM | POA: Insufficient documentation

## 2012-05-04 DIAGNOSIS — I4891 Unspecified atrial fibrillation: Secondary | ICD-10-CM | POA: Insufficient documentation

## 2012-05-04 DIAGNOSIS — I209 Angina pectoris, unspecified: Secondary | ICD-10-CM | POA: Insufficient documentation

## 2012-05-04 DIAGNOSIS — Z9861 Coronary angioplasty status: Secondary | ICD-10-CM | POA: Insufficient documentation

## 2012-05-06 ENCOUNTER — Encounter (HOSPITAL_COMMUNITY): Payer: Self-pay

## 2012-05-08 ENCOUNTER — Encounter (HOSPITAL_COMMUNITY): Payer: Self-pay

## 2012-05-11 ENCOUNTER — Encounter (HOSPITAL_COMMUNITY): Payer: Self-pay

## 2012-05-13 ENCOUNTER — Encounter (HOSPITAL_COMMUNITY): Payer: Self-pay

## 2012-05-15 ENCOUNTER — Encounter (HOSPITAL_COMMUNITY): Payer: Self-pay

## 2012-05-18 ENCOUNTER — Encounter (HOSPITAL_COMMUNITY): Payer: Self-pay

## 2012-05-20 ENCOUNTER — Encounter (HOSPITAL_COMMUNITY): Payer: Self-pay

## 2012-05-22 ENCOUNTER — Encounter (HOSPITAL_COMMUNITY): Payer: Self-pay

## 2012-05-25 ENCOUNTER — Encounter (HOSPITAL_COMMUNITY)
Admission: RE | Admit: 2012-05-25 | Discharge: 2012-05-25 | Disposition: A | Discharge status: Home / Self Care | Payer: Self-pay | Source: Ambulatory Visit | Attending: Cardiology | Admitting: Cardiology

## 2012-05-27 ENCOUNTER — Encounter (HOSPITAL_COMMUNITY): Payer: Self-pay

## 2012-05-29 ENCOUNTER — Encounter (HOSPITAL_COMMUNITY): Payer: Self-pay

## 2012-06-01 ENCOUNTER — Ambulatory Visit (INDEPENDENT_AMBULATORY_CARE_PROVIDER_SITE_OTHER): Payer: Medicare Other | Admitting: Pulmonary Disease

## 2012-06-01 ENCOUNTER — Encounter (HOSPITAL_COMMUNITY)
Admission: RE | Admit: 2012-06-01 | Discharge: 2012-06-01 | Disposition: A | Discharge status: Home / Self Care | Payer: Self-pay | Source: Ambulatory Visit | Attending: Cardiology | Admitting: Cardiology

## 2012-06-01 ENCOUNTER — Encounter: Payer: Self-pay | Admitting: Pulmonary Disease

## 2012-06-01 ENCOUNTER — Other Ambulatory Visit (INDEPENDENT_AMBULATORY_CARE_PROVIDER_SITE_OTHER): Payer: Medicare Other

## 2012-06-01 VITALS — BP 130/84 | HR 56 | Temp 97.5°F | Ht 62.0 in | Wt 172.6 lb

## 2012-06-01 DIAGNOSIS — E7800 Pure hypercholesterolemia, unspecified: Secondary | ICD-10-CM

## 2012-06-01 DIAGNOSIS — I4892 Unspecified atrial flutter: Secondary | ICD-10-CM

## 2012-06-01 DIAGNOSIS — I2510 Atherosclerotic heart disease of native coronary artery without angina pectoris: Secondary | ICD-10-CM

## 2012-06-01 DIAGNOSIS — I10 Essential (primary) hypertension: Secondary | ICD-10-CM

## 2012-06-01 DIAGNOSIS — D126 Benign neoplasm of colon, unspecified: Secondary | ICD-10-CM

## 2012-06-01 DIAGNOSIS — K5730 Diverticulosis of large intestine without perforation or abscess without bleeding: Secondary | ICD-10-CM

## 2012-06-01 DIAGNOSIS — E559 Vitamin D deficiency, unspecified: Secondary | ICD-10-CM

## 2012-06-01 DIAGNOSIS — D649 Anemia, unspecified: Secondary | ICD-10-CM

## 2012-06-01 DIAGNOSIS — F411 Generalized anxiety disorder: Secondary | ICD-10-CM

## 2012-06-01 DIAGNOSIS — K449 Diaphragmatic hernia without obstruction or gangrene: Secondary | ICD-10-CM

## 2012-06-01 DIAGNOSIS — J45909 Unspecified asthma, uncomplicated: Secondary | ICD-10-CM

## 2012-06-01 DIAGNOSIS — E079 Disorder of thyroid, unspecified: Secondary | ICD-10-CM

## 2012-06-01 DIAGNOSIS — M1990 Unspecified osteoarthritis, unspecified site: Secondary | ICD-10-CM

## 2012-06-01 LAB — CBC WITH DIFFERENTIAL/PLATELET
Basophils Absolute: 0 10*3/uL (ref 0.0–0.1)
Basophils Relative: 0.6 % (ref 0.0–3.0)
Eosinophils Absolute: 0.1 10*3/uL (ref 0.0–0.7)
Lymphocytes Relative: 36.1 % (ref 12.0–46.0)
MCHC: 31.8 g/dL (ref 30.0–36.0)
Neutrophils Relative %: 53.2 % (ref 43.0–77.0)
RBC: 5.26 Mil/uL — ABNORMAL HIGH (ref 3.87–5.11)

## 2012-06-01 LAB — LIPID PANEL
Cholesterol: 128 mg/dL (ref 0–200)
Total CHOL/HDL Ratio: 2
Triglycerides: 32 mg/dL (ref 0.0–149.0)

## 2012-06-01 LAB — HEPATIC FUNCTION PANEL
ALT: 17 U/L (ref 0–35)
Bilirubin, Direct: 0.2 mg/dL (ref 0.0–0.3)
Total Protein: 7.8 g/dL (ref 6.0–8.3)

## 2012-06-01 LAB — BASIC METABOLIC PANEL
BUN: 19 mg/dL (ref 6–23)
CO2: 27 mEq/L (ref 19–32)
Chloride: 103 mEq/L (ref 96–112)
Creatinine, Ser: 1.1 mg/dL (ref 0.4–1.2)

## 2012-06-01 LAB — TSH: TSH: 2.11 u[IU]/mL (ref 0.35–5.50)

## 2012-06-01 NOTE — Patient Instructions (Addendum)
Today we updated your med list in our EPIC system...    Continue your current medications the same...  Today we did your follow up FASTING blood work...    We will contact you w/ the results when available...   Keep up the good work in cardiac rehab...  Call for any questions...  Let's plan a follow up visit in 63mo, sooner if needed for problems.Marland KitchenMarland Kitchen

## 2012-06-01 NOTE — Progress Notes (Signed)
Subjective:    Patient ID: Virginia Casey, female    DOB: [birth year], 79 y.o.   MRN: [id number]  HPI 79 y/o BF here for a follow up visit... she has mult med problems as noted below...   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 She is very proud of being nominated for Mother of the Year thru her church...    AB> doing well, no resp infections or exacerbations, on no regular meds...    HBP> on Amlod5 & Losartan100; BP= 122/82 & she denies CP, palpit, SOB, edema; rec to continue same meds + diet...    CAD> followed by Maia Breslow; we do not have his notes; pt states she is doing fine & denies CP/ angina/ etc; she loves the cardiac rehab program...    PAFlutter> still on a low dose Amio per DrHarwani- 200mg tabs- taking 1/2 MWF; we do ot have records & rely on her hx & med list; she denies palpit, dizzy, SOB, etc...    Cerebrovasc dis> on ASA81; she denies cerebral ischemic symptoms...    Chol> on Cres20; FLP shows TChol 118, TG 42, HDL 60, LDL 50- looks great continue same...    Thyroid dis> followed by Aaron Edelman; she has hx hyperthyroidism secondary to Amio & a prob multinod goiter; treated w/ Tapazole, then PTU but we do not have recent notes; she tells me that she is off the PTU & "everything is fine";     GI> HH, Divertics, Polyps> on PepcidBid    DJD> on Tylenol & VitD 1000u daily but Vit D level today is still low at  20 & she is asked to incr to 2000u/d...    Anxiety> on Alpraz0.25 prn which really helps...    Anemia> on Fe w/ VitC; Hg=13.1, MCV=78, Fe=66; rec to continue supplements... LABS 2/13:  FLP- great on Cres20;  Chems- ok w/ BUN=26 Creat=1.5;  CBC- wnl x MCV=78 Fe=66;  TSH=2.81;  VitD=20 & rec to incr supplement.  ~  October 22, 2011:  42mo ROV & Virginia Casey reports that she is stable- had some chest discomfort and went to the ER 7/13> no evid of ischemia & she had f/u [facility] who did a stress test which was neg...     [name] her medical problems & current medications and updated them below >> CXR 7/13 showed normal heart size, largeHH, bibasilar atx, otherw clear... EKG 7/13 showed SBrady, rate52, no acute changes... Myoview 8/13 at [facility] showed no ischemia or infarction, normal wall motion, EF=70%... LABS 7/13:  Chems- wnl;  CBC- wnl...   ~  June 01, 2012:  46mo ROV & Virginia Casey reports a stable interval w/o new complaints or concerns... [name] the following medical problems during today's office visit >>     AB> doing well, no resp infections or exacerbations, on no  regular meds...    HBP> on Losartan100 & off Amlod5; BP= 130/84 & she denies CP, palpit, SOB, edema; rec to continue same meds + diet...    CAD> followed by Maia Breslow; we do not have his notes; pt states she is doing fine & denies CP/ angina/ etc; she loves the cardiac rehab program...    PAFlutter> still on Amio per [facility]- 200mg tabs- taking one daily now; she is off the Eliquis; we do not have records & rely on her hx & med list; she denies palpit, dizzy, SOB, etc...    Cerebrovasc dis> on ASA81; she denies cerebral ischemic symptoms...    Chol> on Cres20; FLP shows TChol 128, TG 32, HDL 68, LDL 53- looks great continue same...    Thyroid dis> followed by Aaron Edelman; she has hx hyperthyroidism secondary to Amio & a prob multinod goiter; treated w/ Tapazole, then PTU but we do not have recent notes; she tells me that she is off the PTU &  "everything is fine";     GI> HH, Divertics, Polyps> on PepcidBid; she denies abd pain, dysphagia, n/v, c/d, blood seen...    DJD> on Tylenol & VitD 1000u daily but Vit D level today is still low at 20 & she is asked to incr to 2000u/d...    Anxiety> on Alpraz0.25 prn which really helps...    Anemia> on Fe w/ VitC; Hg=13.1, MCV=78, Fe=66; rec to continue supplements... [name] prob list, meds, xrays and labs> see below for updates >>  LABS 3/14:  FLP- at goals on Cres20;  Chems- wnl;  CBC- wnl;  TSH=2.11;  VitD=12 & Rec to start 50,000u weekly supplement...          Problem List:  Hearing Loss >> she saw DrCrossley 3/13 w/ a high freq hearing loss & he removed creumen impactions' they are holding off on hearing aides for now...  ASTHMATIC BRONCHITIS (ICD-466.0) - breathing is stable now- s/p recent exacerbation... ~  CXR 1/12 showed basilar atx & large HH, cardiomeg, osteopenia, NAD... ~  [facility] 5/12 w/ refractory AB exac & improved w/ standard therapy & brief course of Pred; CXR- similar, NAD.[name]. ~  CXR 7/13 showed norm heart size, large HH, clear lungs, NAD...  HYPERTENSION (ICD-401.9) - now on LOSARTAN 100mg /d & AMLODIPINE 5mg /d (and off her prev Ramipril due to asthma & Imdur due to HA) ...  ~  8/12:  BP= 110/72 & not checking BP's at home; she denies visual changes, CP, palipit, syncope, dyspnea, edema, etc... ~  2/13:  BP= 122/82 & she is stable, mostly asymptomatic w/o CP, palpit, SOB, etc... ~  8/13:  BP= 124/86 & she remains stable... ~  3/14: on Losartan100 & off Amlod5; BP= 130/84 & she denies CP, palpit, SOB, edema; rec to continue same meds + diet.  ATHEROSCLEROTIC HEART DISEASE (ICD-414.00) & PERIPHERAL VASCULAR DISEASE (ICD-443.9) - regularly on ASA 81mg /d, & off Plavix... followed by [facility] for Cards (we do not have his notes to review med changes)>> ~  hosp in Jan08 w/ syncope and MRI Brain showed mild atrophy & sm vessel dis;  MRA showed basilar art narrowing & mod  stenosis in RICA cavernous segment... CDopplers however were WNL w/ antegrade vertebral flow, and no signif ICA stenoses detected... ~  hosp Feb10 by [facility] w/ non-STEMI- s/p PTCA and stent in the LAD. ~  hosp 7/11 by [facility] w/ CP, Abn Myoview & cath w/ 70% midLAD in-stent resteosis; s/p PTCA & started back on Plavix.Marland KitchenMarland Kitchen ~  she is in Cardiac Rehab & really likes it... ~  7/13:  She went to the ER w/ chest discomfort; ruled out for ischemia (EKG= SBrday, rate52, wnl/NAD) & followed up w/ DrHarwani> he did MYOVIEW 8/13 which was neg for ischemia or infarction, norm wall motion & EF=70%...  ATRIAL FLUTTER, PAROXYSMAL (ICD-427.32) - this occured 2/10 following her cath, PTCA, stent... he diagnosed flutter w/ 2:1 block... currently taking AMIODARONE 200mg /d...  HYPERCHOLESTEROLEMIA (ICD-272.0) - on CRESTOR 20mg /d... ~  FLP 11/08 showed TChol 161, TG 39, HDL 62, LDL 92... continue same med. ~  FLP 5/09 showed TChol 209, TG 70, HDL 74, LDL 124... rec- better diet, get weight down! ~  FLP in hosp 2/10 showed TChol 130, TG 69, HDL 45, LDL 71 ~  she reports that [facility] does fasting labs... ~  FLP 7/11 in hosp showed TChol 105, TG 30, HDL 66, LDL 33 ~  FLP 2/12 on Cres20 showed TChol 97, TG 38, HDL 50, LDL 39... copy to Cards ?decr to 10mg ? ~  FLP 2/13 on Cres20 showed TChol 118, TG 42, HDL 60, LDL 50... Continue same. ~  FLP 3/14 on Cres20 showed TChol 128, TG 32, HDL 68, LDL 53   THYROID DISEASE >> Hx Amiodarone induced hyperthyroidism diagnosed 2/12 & referred to [facility], treated w/ PTU (now off) & DrHarwani tapered the Amio... ~  1/13:  She had f/u DrKumar> everything improve4d w/ reduction in Amio dose; clinically euthyroid & no goiter palpated; TFT's in his office were WNL==> TSH=3.74,  FreeT3=2.4 (2.0-4.4), FreeT4=0.91 (0.61-1.12) ~  Labs 3/14 showed TSH= 2.11  HIATAL HERNIA (ICD-553.3) - prev on Nexium & Carafate Liq- but changed to PEPCID 20mg Bid by [facility] due to need for Plavix... she  has a giant HH measuring 10cm per DrSam, w/ erosions in the pouch... last EGD by DrSam 4/08... hernia is seen on her CXRs.  DIVERTICULOSIS OF COLON (ICD-562.10) & COLONIC POLYPS (ICD-211.3) ~  colonoscopy 11/06 showed divertics and 2- 4mm polyps... f/u planned in 4-81yrs... ~  [facility] 11/10 w/ lower GIB believed due to divertics... colonoscopy by DrJacobs showed severe divertics, no bleeding site, 7mm polyp removed...  DEGENERATIVE JOINT DISEASE (ICD-715.90) & HIP PAIN, RIGHT (ICD-719.45) - currently taking OTC meds only but DrDean rec Vicodin for as needed use (she prefers Tylenol)... Eval by DrDean 3/09 w/ bone-on-bone right hip, tried injection & pain meds... right THR done 12/09...  VITAMIN D DEFICIENCY (ICD-268.9) ~  labs 5/09 showed Vit D level = 9... pt started on 50K weekly but she switched on her own to 1000u/d OTC. ~  labs 2/12 showed Vit D level = 18... asked to incr Vit D supplement to 2000 u daily (she never did). ~  Labs 2/13 showed Vit D level = 20... Asked to finally incr VitD supplement to 2000u daily... ~  Labs 3/14 showed Vit D = 12 & Rec to switch to 50K weekly Rx...  ANXIETY (ICD-300.00) - she is requesting a mild nerve pill & we have written ALPRAZOLAM 0.25mg  Prn use...  ANEMIA (ICD-285.9) - prev on Niferex 150mg /d, now FeSO4 325mg /d w/ Vit C 500mg ... 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 ~  last blood count 11/08 showed Hg=13.7, Fe=56... ~  labs 5/09 showed Hg= 14.2.[name]. ~  labs 2/10 in hosp showed Hg= 13 ~  [facility] 11/10 w/ lower GIB, & Hg down to 8.6- tc 1u PC up to 9.3.Marland KitchenMarland Kitchen ~  labs 12/10 showed Hg= 10.6.[name]. rec> continue oral Fe w/ VitC... ~  Hosp labs 7/11 showed Hg= 11.6 - 10.5, MCV= 70... rec> restart Fe. ~  labs 2/12 showed Hg= 12.4, MCV= 77, Fe= 56 (sat=10%)... continue Fe w/ VitC. ~  Labs 5/12 in hosp showed Hg= 12.3, MCV= 68, & she continues on FeSO4 daily w/ VitC... ~  Labs 2/13 showed Hg=13.1, MCV=78, Fe=66; rec to continue supplements... 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   Past Surgical History  Procedure Laterality Date  . Total hip arthroplasty  12/09    Dr August Saucer  . Coronary angioplasty with stent placement  2/10    Dr Sharyn Lull  . Coronary angioplasty with stent placement  7/11    Dr Sharyn Lull    Outpatient Encounter Prescriptions as of 06/01/2012  Medication Sig Dispense Refill  . acetaminophen (TYLENOL) 325 MG tablet Take 325 mg by mouth every 6 (six) hours as needed. For pain      . ALPRAZolam (XANAX) 0.25 MG tablet take 1 tablet by mouth three times a day if needed  90 tablet  0  . amiodarone (PACERONE) 200 MG tablet Take 200 mg by mouth daily.       [name] aspirin 81 MG tablet Take 81 mg by mouth daily.       . Cholecalciferol (VITAMIN D) 1000 UNITS capsule Take 1,000 Units by mouth daily.        . famotidine (PEPCID) 20 MG tablet Take 1 tablet by mouth Twice daily.      . ferrous gluconate (FERGON) 325 MG tablet Take 325 mg by mouth daily with breakfast. With vitamin C tablet       . losartan (COZAAR) 100 MG tablet Take 1 tablet (100 mg total) by mouth daily.  30 tablet  11  . nitroGLYCERIN (NITROSTAT) 0.4 MG SL tablet Place 0.4 mg under the tongue every 5 (five) minutes as needed. May repeat x3       . rosuvastatin (CRESTOR) 20 MG tablet Take 20 mg by mouth daily.       . vitamin C (ASCORBIC ACID) 500 MG tablet Take 500 mg by mouth daily. With iron tablet       . [DISCONTINUED] amLODipine (NORVASC) 5 MG tablet Take 1 tablet (5 mg total) by mouth daily.  30 tablet  11  . [DISCONTINUED] apixaban (ELIQUIS) 5 MG TABS tablet Take 5 mg by mouth 2 (two) times daily.       No facility-administered encounter medications on file as of 06/01/2012.    No Known Allergies   Current Medications, Allergies, Past Medical History, Past Surgical History, Family History, and Social History were reviewed in [employer]  record.   Review of Systems         See HPI - all other systems neg except as noted... The patient complains of dyspnea on exertion.  The patient denies anorexia, fever, weight loss, weight gain, vision loss, decreased hearing, hoarseness, chest pain, syncope, peripheral edema, prolonged cough, headaches, hemoptysis, abdominal pain, melena, hematochezia, severe indigestion/heartburn, hematuria, incontinence, muscle weakness, suspicious skin lesions, transient blindness, difficulty walking, depression, unusual weight change, abnormal bleeding, enlarged lymph nodes, and angioedema.     Objective:   Physical Exam     WD, WN, 79 y/o BF in NAD... GENERAL:  Alert & oriented; pleasant & cooperative... HEENT:  [place]/AT, EOM-wnl, EACs-clear, TMs-wnl, NOSE-clear, THROAT-clear & wnl. NECK:  Supple w/ fairROM; no JVD;  normal carotid impulses w/o bruits; no thyromegaly or nodules palpated; no lymphadenopathy. CHEST:  Coarse BS w/ no wheezing, no rales, no signs of consolidation. HEART:  Regular Rhythm; gr 1/6 SEM without rubs or gallops detected... ABDOMEN:  Soft & nontender; normal bowel sounds; no organomegaly or masses palpated... EXT: without deformities, mod arthritic changes; s/p right THR; no varicose veins/ +venous insuffic/  tr edema . BACK:  sl tender along right PSIS area... NEURO:  CN's intact; no focal neuro defict... DERM:  no lesions seen...  RADIOLOGY DATA:  Reviewed in the EPIC EMR & discussed w/ the patient...    >>CXR 5/12 showed mod sized HH, stable cardiomeg, bibasilar atx, osteopenia, NAD...  LABORATORY DATA:  Reviewed in the EPIC EMR & discussed w/ the patient...    >>LABS 2/13:  FLP- great on Cres20;  Chems- ok w/ BUN=26 Creat=1.5;  CBC- wnl x MCV=78 Fe=66;  TSH=2.81;  VitD=20 & rec to incr supplement.   Assessment & Plan:    AB>  Improved overall & she stopped all meds on her own...   HBP>  Well controlled on the Losartan & Amlodipine; asked to monitor BP at  home...  ASHD>  Followed by [facility] for Cards...  AFlutter>  DrHarwani has her on Amio to 1 tab daily; her prev thyroid problems have resolved...  CHOL>  On Cres20 w/ good control of FLP...  THYROID DISEASE>  Prev Amio induced hyperthyroidism managed by DrKumar (on PTU prev) & resolved on lower dose of Amio...  GI>  Hx HH/ GERD/ Divertics/ Polyps>  She is followed by DrJacobs for GI, continue H2blocker Rx...  DJD>  She is followed by DrDean for Ortho...  Anemia>  Hg is OK but MCV is low, Fe=66 & she will continue her Fe & VitC supplements...  Anxiety>  Requesting a mild nerve pill & we wrote for Alprazolam...   Patient's Medications  New Prescriptions   VITAMIN D, ERGOCALCIFEROL, (DRISDOL) 50000 UNITS CAPS    Take 1 capsule (50,000 Units total) by mouth every 7 (seven) days.  Previous Medications   ACETAMINOPHEN (TYLENOL) 325 MG TABLET    Take 325 mg by mouth every 6 (six) hours as needed. For pain   ALPRAZOLAM (XANAX) 0.25 MG TABLET    take 1 tablet by mouth three times a day if needed   AMIODARONE (PACERONE) 200 MG TABLET    Take 200 mg by mouth daily.    ASPIRIN 81 MG TABLET    Take 81 mg by mouth daily.    CHOLECALCIFEROL (VITAMIN D) 1000 UNITS CAPSULE    Take 1,000 Units by mouth daily.     FAMOTIDINE (PEPCID) 20 MG TABLET    Take 1 tablet by mouth Twice daily.   FERROUS GLUCONATE (FERGON) 325 MG TABLET    Take 325 mg by mouth daily with breakfast. With vitamin C tablet    LOSARTAN (COZAAR) 100 MG TABLET    Take 1 tablet (100 mg total) by mouth daily.   NITROGLYCERIN (NITROSTAT) 0.4 MG SL TABLET    Place 0.4 mg under the tongue every 5 (five) minutes as needed. May repeat x3    ROSUVASTATIN (CRESTOR) 20 MG TABLET    Take 20 mg by mouth daily.    VITAMIN C (ASCORBIC ACID) 500 MG TABLET    Take 500 mg by mouth daily. With iron tablet   Modified Medications   No medications on file  Discontinued Medications   AMLODIPINE (NORVASC) 5 MG TABLET    Take 1  tablet (5 mg total) by  mouth daily.   APIXABAN (ELIQUIS) 5 MG TABS TABLET    Take 5 mg by mouth 2 (two) times daily.

## 2012-06-02 LAB — VITAMIN D 25 HYDROXY (VIT D DEFICIENCY, FRACTURES): Vit D, 25-Hydroxy: 12 ng/mL — ABNORMAL LOW (ref 30–89)

## 2012-06-03 ENCOUNTER — Encounter (HOSPITAL_COMMUNITY): Payer: Self-pay

## 2012-06-03 DIAGNOSIS — Z9861 Coronary angioplasty status: Secondary | ICD-10-CM | POA: Insufficient documentation

## 2012-06-03 DIAGNOSIS — I4891 Unspecified atrial fibrillation: Secondary | ICD-10-CM | POA: Insufficient documentation

## 2012-06-03 DIAGNOSIS — I209 Angina pectoris, unspecified: Secondary | ICD-10-CM | POA: Insufficient documentation

## 2012-06-03 DIAGNOSIS — I252 Old myocardial infarction: Secondary | ICD-10-CM | POA: Insufficient documentation

## 2012-06-03 DIAGNOSIS — Z5189 Encounter for other specified aftercare: Secondary | ICD-10-CM | POA: Insufficient documentation

## 2012-06-03 DIAGNOSIS — I2510 Atherosclerotic heart disease of native coronary artery without angina pectoris: Secondary | ICD-10-CM | POA: Insufficient documentation

## 2012-06-04 ENCOUNTER — Other Ambulatory Visit: Payer: Self-pay | Admitting: Pulmonary Disease

## 2012-06-04 MED ORDER — VITAMIN D (ERGOCALCIFEROL) 1.25 MG (50000 UNIT) PO CAPS: 50000.0000 [IU] | ORAL_CAPSULE | ORAL | Status: DC

## 2012-06-05 ENCOUNTER — Encounter (HOSPITAL_COMMUNITY): Payer: Self-pay

## 2012-06-08 ENCOUNTER — Encounter (HOSPITAL_COMMUNITY)
Admission: RE | Admit: 2012-06-08 | Discharge: 2012-06-08 | Disposition: A | Discharge status: Home / Self Care | Payer: Self-pay | Source: Ambulatory Visit | Attending: Cardiology | Admitting: Cardiology

## 2012-06-10 ENCOUNTER — Encounter (HOSPITAL_COMMUNITY): Payer: Self-pay

## 2012-06-12 ENCOUNTER — Encounter (HOSPITAL_COMMUNITY): Payer: Self-pay

## 2012-06-15 ENCOUNTER — Encounter (HOSPITAL_COMMUNITY): Payer: Self-pay

## 2012-06-17 ENCOUNTER — Encounter (HOSPITAL_COMMUNITY): Payer: Self-pay

## 2012-06-19 ENCOUNTER — Encounter (HOSPITAL_COMMUNITY): Payer: Self-pay

## 2012-06-22 ENCOUNTER — Encounter (HOSPITAL_COMMUNITY)
Admission: RE | Admit: 2012-06-22 | Discharge: 2012-06-22 | Disposition: A | Discharge status: Home / Self Care | Payer: Self-pay | Source: Ambulatory Visit | Attending: Cardiology | Admitting: Cardiology

## 2012-06-24 ENCOUNTER — Encounter (HOSPITAL_COMMUNITY): Payer: Self-pay

## 2012-06-26 ENCOUNTER — Encounter (HOSPITAL_COMMUNITY): Payer: Self-pay

## 2012-06-29 ENCOUNTER — Encounter (HOSPITAL_COMMUNITY)
Admission: RE | Admit: 2012-06-29 | Discharge: 2012-06-29 | Disposition: A | Discharge status: Home / Self Care | Payer: Self-pay | Source: Ambulatory Visit | Attending: Cardiology | Admitting: Cardiology

## 2012-07-01 ENCOUNTER — Encounter (HOSPITAL_COMMUNITY): Payer: Self-pay

## 2012-07-03 ENCOUNTER — Encounter (HOSPITAL_COMMUNITY): Payer: Self-pay

## 2012-07-03 DIAGNOSIS — I209 Angina pectoris, unspecified: Secondary | ICD-10-CM | POA: Insufficient documentation

## 2012-07-03 DIAGNOSIS — I4891 Unspecified atrial fibrillation: Secondary | ICD-10-CM | POA: Insufficient documentation

## 2012-07-03 DIAGNOSIS — Z9861 Coronary angioplasty status: Secondary | ICD-10-CM | POA: Insufficient documentation

## 2012-07-03 DIAGNOSIS — I2510 Atherosclerotic heart disease of native coronary artery without angina pectoris: Secondary | ICD-10-CM | POA: Insufficient documentation

## 2012-07-03 DIAGNOSIS — I252 Old myocardial infarction: Secondary | ICD-10-CM | POA: Insufficient documentation

## 2012-07-03 DIAGNOSIS — Z5189 Encounter for other specified aftercare: Secondary | ICD-10-CM | POA: Insufficient documentation

## 2012-07-06 ENCOUNTER — Encounter (HOSPITAL_COMMUNITY)
Admission: RE | Admit: 2012-07-06 | Discharge: 2012-07-06 | Disposition: A | Discharge status: Home / Self Care | Payer: Self-pay | Source: Ambulatory Visit | Attending: Cardiology | Admitting: Cardiology

## 2012-07-08 ENCOUNTER — Encounter (HOSPITAL_COMMUNITY): Payer: Self-pay

## 2012-07-10 ENCOUNTER — Encounter (HOSPITAL_COMMUNITY): Payer: Self-pay

## 2012-07-13 ENCOUNTER — Encounter (HOSPITAL_COMMUNITY): Payer: Self-pay

## 2012-07-15 ENCOUNTER — Encounter (HOSPITAL_COMMUNITY): Payer: Self-pay

## 2012-07-17 ENCOUNTER — Encounter (HOSPITAL_COMMUNITY): Payer: Self-pay

## 2012-07-20 ENCOUNTER — Encounter (HOSPITAL_COMMUNITY): Payer: Self-pay

## 2012-07-22 ENCOUNTER — Encounter (HOSPITAL_COMMUNITY): Payer: Self-pay

## 2012-07-24 ENCOUNTER — Encounter (HOSPITAL_COMMUNITY): Payer: Self-pay

## 2012-07-29 ENCOUNTER — Encounter (HOSPITAL_COMMUNITY): Payer: Self-pay

## 2012-07-31 ENCOUNTER — Encounter (HOSPITAL_COMMUNITY): Payer: Self-pay

## 2012-08-03 ENCOUNTER — Encounter (HOSPITAL_COMMUNITY)
Admission: RE | Admit: 2012-08-03 | Discharge: 2012-08-03 | Disposition: A | Discharge status: Home / Self Care | Payer: Self-pay | Source: Ambulatory Visit | Attending: Cardiology | Admitting: Cardiology

## 2012-08-03 DIAGNOSIS — Z5189 Encounter for other specified aftercare: Secondary | ICD-10-CM | POA: Insufficient documentation

## 2012-08-03 DIAGNOSIS — I209 Angina pectoris, unspecified: Secondary | ICD-10-CM | POA: Insufficient documentation

## 2012-08-03 DIAGNOSIS — I2510 Atherosclerotic heart disease of native coronary artery without angina pectoris: Secondary | ICD-10-CM | POA: Insufficient documentation

## 2012-08-03 DIAGNOSIS — Z9861 Coronary angioplasty status: Secondary | ICD-10-CM | POA: Insufficient documentation

## 2012-08-03 DIAGNOSIS — I4891 Unspecified atrial fibrillation: Secondary | ICD-10-CM | POA: Insufficient documentation

## 2012-08-03 DIAGNOSIS — I252 Old myocardial infarction: Secondary | ICD-10-CM | POA: Insufficient documentation

## 2012-08-05 ENCOUNTER — Encounter (HOSPITAL_COMMUNITY): Payer: Self-pay

## 2012-08-07 ENCOUNTER — Encounter (HOSPITAL_COMMUNITY): Payer: Self-pay

## 2012-08-10 ENCOUNTER — Encounter (HOSPITAL_COMMUNITY)
Admission: RE | Admit: 2012-08-10 | Discharge: 2012-08-10 | Disposition: A | Discharge status: Home / Self Care | Payer: Self-pay | Source: Ambulatory Visit | Attending: Cardiology | Admitting: Cardiology

## 2012-08-12 ENCOUNTER — Encounter (HOSPITAL_COMMUNITY): Payer: Self-pay

## 2012-08-14 ENCOUNTER — Encounter (HOSPITAL_COMMUNITY): Payer: Self-pay

## 2012-08-17 ENCOUNTER — Encounter (HOSPITAL_COMMUNITY)
Admission: RE | Admit: 2012-08-17 | Discharge: 2012-08-17 | Disposition: A | Discharge status: Home / Self Care | Payer: Self-pay | Source: Ambulatory Visit | Attending: Cardiology | Admitting: Cardiology

## 2012-08-19 ENCOUNTER — Encounter (HOSPITAL_COMMUNITY): Payer: Self-pay

## 2012-08-21 ENCOUNTER — Encounter (HOSPITAL_COMMUNITY): Payer: Self-pay

## 2012-08-24 ENCOUNTER — Encounter (HOSPITAL_COMMUNITY): Payer: Self-pay

## 2012-08-26 ENCOUNTER — Encounter (HOSPITAL_COMMUNITY): Payer: Self-pay

## 2012-08-28 ENCOUNTER — Encounter (HOSPITAL_COMMUNITY): Payer: Self-pay

## 2012-08-31 ENCOUNTER — Encounter (HOSPITAL_COMMUNITY)
Admission: RE | Admit: 2012-08-31 | Discharge: 2012-08-31 | Disposition: A | Discharge status: Home / Self Care | Payer: Self-pay | Source: Ambulatory Visit | Attending: Cardiology | Admitting: Cardiology

## 2012-09-02 ENCOUNTER — Encounter (HOSPITAL_COMMUNITY): Payer: Medicare Other | Attending: Cardiology

## 2012-09-02 DIAGNOSIS — I209 Angina pectoris, unspecified: Secondary | ICD-10-CM | POA: Insufficient documentation

## 2012-09-02 DIAGNOSIS — I4891 Unspecified atrial fibrillation: Secondary | ICD-10-CM | POA: Insufficient documentation

## 2012-09-02 DIAGNOSIS — I2510 Atherosclerotic heart disease of native coronary artery without angina pectoris: Secondary | ICD-10-CM | POA: Insufficient documentation

## 2012-09-02 DIAGNOSIS — Z9861 Coronary angioplasty status: Secondary | ICD-10-CM | POA: Insufficient documentation

## 2012-09-02 DIAGNOSIS — I252 Old myocardial infarction: Secondary | ICD-10-CM | POA: Insufficient documentation

## 2012-09-02 DIAGNOSIS — Z5189 Encounter for other specified aftercare: Secondary | ICD-10-CM | POA: Insufficient documentation

## 2012-09-07 ENCOUNTER — Encounter (HOSPITAL_COMMUNITY): Payer: Medicare Other

## 2012-09-07 ENCOUNTER — Other Ambulatory Visit: Payer: Self-pay | Admitting: Pulmonary Disease

## 2012-09-09 ENCOUNTER — Encounter (HOSPITAL_COMMUNITY): Payer: Medicare Other

## 2012-09-11 ENCOUNTER — Encounter (HOSPITAL_COMMUNITY): Payer: Medicare Other

## 2012-09-14 ENCOUNTER — Encounter (HOSPITAL_COMMUNITY): Payer: Medicare Other

## 2012-09-16 ENCOUNTER — Encounter (HOSPITAL_COMMUNITY): Payer: Medicare Other

## 2012-09-18 ENCOUNTER — Encounter (HOSPITAL_COMMUNITY): Payer: Medicare Other

## 2012-09-21 ENCOUNTER — Telehealth: Payer: Self-pay | Admitting: Pulmonary Disease

## 2012-09-21 ENCOUNTER — Other Ambulatory Visit: Payer: Self-pay | Admitting: Pulmonary Disease

## 2012-09-21 ENCOUNTER — Encounter (HOSPITAL_COMMUNITY): Payer: Medicare Other

## 2012-09-21 NOTE — Telephone Encounter (Signed)
Virginia Casey has already spoken with pt and scheduled ov w/ SN tomorrow 7.22.14 @ 1130 Will sign off

## 2012-09-21 NOTE — Telephone Encounter (Signed)
Pt scheduled for appt 09/22/12 at 11:30 am with Dr. Kriste Basque.

## 2012-09-21 NOTE — Telephone Encounter (Signed)
Pt calling again in ref to previous msg can be reached at [phone].Virginia Casey

## 2012-09-22 ENCOUNTER — Ambulatory Visit (INDEPENDENT_AMBULATORY_CARE_PROVIDER_SITE_OTHER)
Admission: RE | Admit: 2012-09-22 | Discharge: 2012-09-22 | Disposition: A | Discharge status: Home / Self Care | Payer: Medicare Other | Source: Ambulatory Visit | Attending: Pulmonary Disease | Admitting: Pulmonary Disease

## 2012-09-22 ENCOUNTER — Encounter: Payer: Self-pay | Admitting: Pulmonary Disease

## 2012-09-22 ENCOUNTER — Ambulatory Visit (INDEPENDENT_AMBULATORY_CARE_PROVIDER_SITE_OTHER): Payer: Medicare Other | Admitting: Pulmonary Disease

## 2012-09-22 ENCOUNTER — Other Ambulatory Visit (INDEPENDENT_AMBULATORY_CARE_PROVIDER_SITE_OTHER): Payer: Medicare Other

## 2012-09-22 VITALS — BP 132/92 | HR 57 | Temp 97.0°F | Ht 61.0 in | Wt 173.6 lb

## 2012-09-22 DIAGNOSIS — R0600 Dyspnea, unspecified: Secondary | ICD-10-CM

## 2012-09-22 DIAGNOSIS — M545 Low back pain: Secondary | ICD-10-CM

## 2012-09-22 DIAGNOSIS — R0609 Other forms of dyspnea: Secondary | ICD-10-CM

## 2012-09-22 DIAGNOSIS — E559 Vitamin D deficiency, unspecified: Secondary | ICD-10-CM

## 2012-09-22 DIAGNOSIS — E079 Disorder of thyroid, unspecified: Secondary | ICD-10-CM

## 2012-09-22 DIAGNOSIS — F411 Generalized anxiety disorder: Secondary | ICD-10-CM

## 2012-09-22 DIAGNOSIS — J45909 Unspecified asthma, uncomplicated: Secondary | ICD-10-CM

## 2012-09-22 DIAGNOSIS — K449 Diaphragmatic hernia without obstruction or gangrene: Secondary | ICD-10-CM

## 2012-09-22 DIAGNOSIS — D649 Anemia, unspecified: Secondary | ICD-10-CM

## 2012-09-22 DIAGNOSIS — E7800 Pure hypercholesterolemia, unspecified: Secondary | ICD-10-CM

## 2012-09-22 DIAGNOSIS — R0989 Other specified symptoms and signs involving the circulatory and respiratory systems: Secondary | ICD-10-CM

## 2012-09-22 DIAGNOSIS — I10 Essential (primary) hypertension: Secondary | ICD-10-CM

## 2012-09-22 DIAGNOSIS — M1990 Unspecified osteoarthritis, unspecified site: Secondary | ICD-10-CM

## 2012-09-22 DIAGNOSIS — I2510 Atherosclerotic heart disease of native coronary artery without angina pectoris: Secondary | ICD-10-CM

## 2012-09-22 DIAGNOSIS — J4520 Mild intermittent asthma, uncomplicated: Secondary | ICD-10-CM

## 2012-09-22 DIAGNOSIS — K5730 Diverticulosis of large intestine without perforation or abscess without bleeding: Secondary | ICD-10-CM

## 2012-09-22 DIAGNOSIS — I4892 Unspecified atrial flutter: Secondary | ICD-10-CM

## 2012-09-22 LAB — BASIC METABOLIC PANEL
CO2: 30 mEq/L (ref 19–32)
Calcium: 9.4 mg/dL (ref 8.4–10.5)
GFR: 56.46 mL/min — ABNORMAL LOW (ref >60.00)
Sodium: 140 mEq/L (ref 135–145)

## 2012-09-22 LAB — CBC WITH DIFFERENTIAL/PLATELET
Basophils Relative: 0.5 % (ref 0.0–3.0)
Eosinophils Relative: 1.4 % (ref 0.0–5.0)
Hemoglobin: 13.3 g/dL (ref 12.0–15.0)
Lymphocytes Relative: 34.6 % (ref 12.0–46.0)
Monocytes Relative: 9.8 % (ref 3.0–12.0)
Neutro Abs: 3.6 10*3/uL (ref 1.4–7.7)
RBC: 5.2 Mil/uL — ABNORMAL HIGH (ref 3.87–5.11)
WBC: 6.7 10*3/uL (ref 4.5–10.5)

## 2012-09-22 MED ORDER — IPRATROPIUM-ALBUTEROL 20-100 MCG/ACT IN AERS: 1.0000 | INHALATION_SPRAY | Freq: Four times a day (QID) | RESPIRATORY_TRACT | Status: DC

## 2012-09-22 NOTE — Patient Instructions (Addendum)
Today we updated your med list in our EPIC system...    Continue your current medications the same...  We decided to add in a NEW INHALER for your shortness of breath & wheezing>    Use the COMBIVENT Respimat as demonstrated - one inhalation every 6H as needed for wheezing...  Today we did your follow up CXR, PFT, Labs>>    We will contact you w/ the results when available...   Stay as active as poss but avoid the heat & humidity...  Call for any questions...  Let's plan a follow up visit in 36mo, sooner if needed for problems.Marland KitchenMarland Kitchen

## 2012-09-22 NOTE — Progress Notes (Signed)
Subjective:    Patient ID: Virginia Casey, female    DOB: Apr 13, 1924, 79 y.o.   MRN: [id number]  HPI 80 y/o BF here for a follow up visit... she has mult med problems as noted below...   ~  April 23, 2011:  165mo ROV & [name] states that she is doing well, no new complaints or concerns today;  She continues to follow up w/ [facility] for Cards, and DrKumar for Endocrine;  [name] her Problem List, Meds, XRays, & Labs today... She is very proud of being nominated for Mother of the Year thru her church...    AB> doing well, no resp infections or exacerbations, on no regular meds...    HBP> on Amlod5 & Losartan100; BP= 122/82 & she denies CP, palpit, SOB, edema; rec to continue same meds + diet...    CAD> followed by Maia Breslow; we do not have his notes; pt states she is doing fine & denies CP/ angina/ etc; she loves the cardiac rehab program...    PAFlutter> still on a low dose Amio per [facility]- 200mg tabs- taking 1/2 MWF; we do ot have records & rely on her hx & med list; she denies palpit, dizzy, SOB, etc...    Cerebrovasc dis> on ASA81; she denies cerebral ischemic symptoms...    Chol> on Cres20; FLP shows TChol 118, TG 42, HDL 60, LDL 50- looks great continue same...    Thyroid dis> followed by Aaron Edelman; she has hx hyperthyroidism secondary to Amio & a prob multinod goiter; treated w/ Tapazole, then PTU but we do not have recent notes; she tells me that she is off the PTU & "everything is fine";     GI> HH, Divertics, Polyps> on PepcidBid    DJD> on Tylenol & VitD 1000u daily but Vit D level today is still low at 20 & she is asked to incr to 2000u/d...    Anxiety> on Alpraz0.25 prn which really helps...    Anemia> on Fe w/ VitC; Hg=13.1, MCV=78, Fe=66; rec to continue supplements... LABS 2/13:  FLP- great on Cres20;  Chems- ok w/ BUN=26 Creat=1.5;  CBC- wnl x MCV=78 Fe=66;  TSH=2.81;  VitD=20 & rec to incr supplement.  ~  October 22, 2011:  165mo ROV & Ladavia reports that she is stable- had some  chest discomfort and went to the ER 7/13> no evid of ischemia & she had f/u [facility] who did a stress test which was neg...     [name] her medical problems & current medications and updated them below >> CXR 7/13 showed normal heart size, largeHH, bibasilar atx, otherw clear... EKG 7/13 showed SBrady, rate52, no acute changes... Myoview 8/13 at [facility] showed no ischemia or infarction, normal wall motion, EF=70%... LABS 7/13:  Chems- wnl;  CBC- wnl...   ~  June 01, 2012:  14mo ROV & Tranise reports a stable interval w/o new complaints or concerns... [name] the following medical problems during today's office visit >>     AB> doing well, no resp infections or exacerbations, on no regular meds...    HBP> on Losartan100 & off Amlod5; BP= 130/84 & she denies CP, palpit, SOB, edema; rec to continue same meds + diet...    CAD> followed by Maia Breslow; we do not have his notes; pt states she is doing fine & denies CP/ angina/ etc; she loves the cardiac rehab program...    PAFlutter> still on Amio per [facility]- 200mg tabs- taking one daily now; she is off the Eliquis; we  do not have records & rely on her hx & med list; she denies palpit, dizzy, SOB, etc...    Cerebrovasc dis> on ASA81; she denies cerebral ischemic symptoms...    Chol> on Cres20; FLP shows TChol 128, TG 32, HDL 68, LDL 53- looks great continue same...    Thyroid dis> followed by Aaron Edelman; she has hx hyperthyroidism secondary to Amio & a prob multinod goiter; treated w/ Tapazole, then PTU but we do not have recent notes; she tells me that she is off the PTU & "everything is fine";     GI> HH, Divertics, Polyps> on PepcidBid; she denies abd pain, dysphagia, n/v, c/d, blood seen...    DJD> on Tylenol & VitD 1000u daily but Vit D level today is still low at 20 & she is asked to incr to 2000u/d...    Anxiety> on Alpraz0.25 prn which really helps...    Anemia> on Fe w/ VitC; Hg=13.1, MCV=78, Fe=66; rec to continue supplements... [name]  prob list, meds, xrays and labs> see below for updates >>  LABS 3/14:  FLP- at goals on Cres20;  Chems- wnl;  CBC- wnl;  TSH=2.11;  VitD=12 & Rec to start 50,000u weekly supplement...   ~  September 22, 2012:  84mo ROV & add-on appt at pt request for SOB> Phelan reports that she went [place] the [place] (very hot >100 daily) & ret yest noting incr SOB, can't get a DB, ?wheezy, didn't rest well & was anxious (this was her deceased husb's favorite group);  We discussed doing a thorough assessment>>  Exam is clear- no wheezing, rales, or rhonchi heard...  O2 sats were 98% on RA & did not desat w/ ambulation...  CXR 7/14 showed cardiomeg, largeHH, NAD...   PFT's were attempted but pt absolutely could not do the simple spirometry...  LABS 7/14 showed Chems- wnl, CBC- ok x plat=113K We decided to treat w/ a COMBIVENT Respimat to use 1 spray every 6h as needed for wheezing; she will relax & take her ALPRAZOLAM 0.25mg  tid & avoid the heat... [name] prob list, meds, xrays and labs> see below for updates >>           Problem List:  Hearing Loss >> she saw DrCrossley 3/13 w/ a high freq hearing loss & he removed creumen impactions' they are holding off on hearing aides for now...  ASTHMATIC BRONCHITIS (ICD-466.0) - breathing is stable now- s/p recent exacerbation... ~  CXR 1/12 showed basilar atx & large HH, cardiomeg, osteopenia, NAD... ~  [facility] 5/12 w/ refractory AB exac & improved w/ standard therapy & brief course of Pred; CXR- similar, NAD.[name]. ~  CXR 7/13 showed norm heart size, large HH, clear lungs, NAD.[name]. ~  7/14:  She presented w/ a mild exac brought on by the stress of a trip to [facility] & the heat etc=> Rx w/ COMBIVENT every 6h as needed... ~  CXR 7/14 showed cardiomeg, largeHH, NAD... We attempted PFTs but she was unable to perform... Os sats were 98% on RA...  HYPERTENSION (ICD-401.9) - now on LOSARTAN 100mg /d & AMLODIPINE 5mg /d (and off her prev Ramipril due to asthma &  Imdur due to HA) ...  ~  8/12:  BP= 110/72 & not checking BP's at home; she denies visual changes, CP, palipit, syncope, dyspnea, edema, etc... ~  2/13:  BP= 122/82 & she is stable, mostly asymptomatic w/o CP, palpit, SOB, etc... ~  8/13:  BP= 124/86 & she remains stable... ~  3/14: on Losartan100 & off Amlod5; BP= 130/84 & she denies CP, palpit, SOB, edema; rec to continue same meds + diet. ~  7/14: on Losartan100 & off Amlod5; BP= 132/92 7 she denies CP, palpit, or edema...  ATHEROSCLEROTIC HEART DISEASE (ICD-414.00) & PERIPHERAL VASCULAR DISEASE (ICD-443.9) - regularly on ASA 81mg /d, & off Plavix... followed by [facility] for Cards (we do not have his notes to review med changes)>> ~  hosp in Jan08 w/ syncope and MRI Brain showed mild atrophy & sm vessel dis;  MRA showed basilar art narrowing & mod stenosis in RICA cavernous segment... CDopplers however were WNL w/ antegrade vertebral flow, and no signif ICA stenoses detected... ~  hosp Feb10 by [facility] w/ non-STEMI- s/p PTCA and stent in the LAD. ~  hosp 7/11 by [facility] w/ CP, Abn Myoview & cath w/ 70% midLAD in-stent resteosis; s/p PTCA & started back on Plavix... ~  she is in Cardiac Rehab & really likes it... ~  7/13:  She went to the ER w/ chest discomfort; ruled out for ischemia (EKG= SBrday, rate52, wnl/NAD) & followed up w/ DrHarwani> he did MYOVIEW 8/13 which was neg for ischemia or infarction, norm wall motion & EF=70%... ~  7/14:  She continues to f/u w/ [facility] her cardiologist;  She will see him soon to f/yu from this [place] trip in case he wants to do any additional tests...  ATRIAL FLUTTER, PAROXYSMAL (ICD-427.32) - this occured 2/10 following her cath, PTCA, stent... he diagnosed flutter w/ 2:1 block... currently taking AMIODARONE 200mg /d...  HYPERCHOLESTEROLEMIA (ICD-272.0) - on CRESTOR 20mg /d... ~  FLP 11/08 showed TChol 161, TG 39, HDL 62, LDL 92... continue same med. ~  FLP 5/09 showed TChol 209, TG 70, HDL 74, LDL  124... rec- better diet, get weight down! ~  FLP in hosp 2/10 showed TChol 130, TG 69, HDL 45, LDL 71 ~  she reports that [facility] does fasting labs... ~  FLP 7/11 in hosp showed TChol 105, TG 30, HDL 66, LDL 33 ~  FLP 2/12 on Cres20 showed TChol 97, TG 38, HDL 50, LDL 39... copy to Cards ?decr to 10mg ? ~  FLP 2/13 on Cres20 showed TChol 118, TG 42, HDL 60, LDL 50... Continue same. ~  FLP 3/14 on Cres20 showed TChol 128, TG 32, HDL 68, LDL 53   THYROID DISEASE >> Hx Amiodarone induced hyperthyroidism diagnosed 2/12 & referred to [facility], treated w/ PTU (now off) & DrHarwani tapered the Amio... ~  1/13:  She had f/u DrKumar> everything improve4d w/ reduction in Amio dose; clinically euthyroid & no goiter palpated; TFT's in his office were WNL==> TSH=3.74,  FreeT3=2.4 (2.0-4.4), FreeT4=0.91 (0.61-1.12) ~  Labs 3/14 showed TSH= 2.11  HIATAL HERNIA (ICD-553.3) - prev on Nexium & Carafate Liq- but changed to PEPCID 20mg Bid by [facility] due to need for Plavix... she has a giant HH measuring 10cm per DrSam, w/ erosions in the pouch... last EGD by DrSam 4/08... hernia is seen on her CXRs.  DIVERTICULOSIS OF COLON (ICD-562.10) & COLONIC POLYPS (ICD-211.3) ~  colonoscopy 11/06 showed divertics and 2- 4mm polyps... f/u planned in 4-65yrs... ~  [facility] 11/10 w/ lower GIB believed due to divertics... colonoscopy by DrJacobs showed severe divertics, no bleeding site, 7mm polyp removed...  DEGENERATIVE JOINT DISEASE (ICD-715.90) & HIP PAIN, RIGHT (ICD-719.45) - currently taking OTC meds only but DrDean rec Vicodin for as needed use (she prefers Tylenol)... Eval by DrDean 3/09 w/ bone-on-bone right hip, tried injection & pain meds... right THR  done 12/09...  VITAMIN D DEFICIENCY (ICD-268.9) ~  labs 5/09 showed Vit D level = 9... pt started on 50K weekly but she switched on her own to 1000u/d OTC. ~  labs 2/12 showed Vit D level = 18... asked to incr Vit D supplement to 2000 u daily (she never did). ~  Labs 2/13  showed Vit D level = 20... Asked to finally incr VitD supplement to 2000u daily... ~  Labs 3/14 showed Vit D = 12 & Rec to switch to 50K weekly Rx...  ANXIETY (ICD-300.00) - she is requesting a mild nerve pill & we have written ALPRAZOLAM 0.25mg  Prn use...  ANEMIA (ICD-285.9) - prev on Niferex 150mg /d, now FeSO4 325mg /d w/ Vit C 500mg ... 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 ~  last blood count 11/08 showed Hg=13.7, Fe=56... ~  labs 5/09 showed Hg= 14.2.[name]. ~  labs 2/10 in hosp showed Hg= 13 ~  [facility] 11/10 w/ lower GIB, & Hg down to 8.6- tc 1u PC up to 9.3.Marland KitchenMarland Kitchen ~  labs 12/10 showed Hg= 10.6.[name]. rec> continue oral Fe w/ VitC... ~  [facility] labs 7/11 showed Hg= 11.6 - 10.5, MCV= 70... rec> restart Fe. ~  labs 2/12 showed Hg= 12.4, MCV= 77, Fe= 56 (sat=10%)... continue Fe w/ VitC. ~  Labs 5/12 in hosp showed Hg= 12.3, MCV= 68, & she continues on FeSO4 daily w/ VitC... ~  Labs 2/13 showed Hg=13.1, MCV=78, Fe=66; rec to continue supplements... 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   Past Surgical History  Procedure Laterality Date  . Total hip arthroplasty  12/09    Dr August Saucer  . Coronary angioplasty with stent placement  2/10    Dr Sharyn Lull  . Coronary angioplasty with stent placement  7/11    Dr Sharyn Lull    Outpatient Encounter Prescriptions as of 09/22/2012  Medication Sig Dispense Refill  . acetaminophen (TYLENOL) 325 MG tablet Take 325 mg by mouth every 6 (six) hours as needed. For pain      . ALPRAZolam (XANAX) 0.25 MG tablet take 1 tablet by mouth three times a day if needed  90 tablet  0  . amiodarone (PACERONE) 200 MG tablet Take 200 mg by mouth daily.       [name] aspirin 81 MG tablet Take 81 mg by mouth daily.       . Cholecalciferol (VITAMIN D) 1000 UNITS capsule Take 1,000 Units by mouth daily.        . famotidine (PEPCID) 20 MG tablet Take 1 tablet by mouth Twice daily.      .  ferrous gluconate (FERGON) 325 MG tablet Take 325 mg by mouth daily with breakfast. With vitamin C tablet       . losartan (COZAAR) 100 MG tablet take 1 tablet by mouth once daily  30 tablet  11  . nitroGLYCERIN (NITROSTAT) 0.4 MG SL tablet Place 0.4 mg under the tongue every 5 (five) minutes as needed. May repeat x3       . rosuvastatin (CRESTOR) 20 MG tablet Take 20 mg by mouth daily.       . vitamin C (ASCORBIC ACID) 500 MG tablet Take 500 mg by mouth daily. With iron tablet       . Vitamin D, Ergocalciferol, (DRISDOL) 50000 UNITS CAPS Take 1 capsule (50,000 Units total) by mouth every 7 (seven) days.  4 capsule  11   No facility-administered encounter medications on file as of 09/22/2012.    No Known Allergies   Current Medications, Allergies, Past Medical History, Past Surgical History, Family History, and Social History were reviewed in [employer] record.   Review of Systems         See HPI - all other systems neg except as noted... The patient complains of dyspnea on exertion.  The patient denies anorexia, fever, weight loss, weight gain, vision loss, decreased hearing, hoarseness, chest pain, syncope, peripheral edema, prolonged cough, headaches, hemoptysis, abdominal pain, melena, hematochezia, severe indigestion/heartburn, hematuria, incontinence, muscle weakness, suspicious skin lesions, transient blindness, difficulty walking, depression, unusual weight change, abnormal bleeding, enlarged lymph nodes, and angioedema.     Objective:   Physical Exam     WD, WN, 79 y/o BF in NAD... GENERAL:  Alert & oriented; pleasant & cooperative... HEENT:  [place]/AT, EOM-wnl, EACs-clear, TMs-wnl, NOSE-clear, THROAT-clear & wnl. NECK:  Supple w/ fairROM; no JVD; normal carotid impulses w/o bruits; no thyromegaly or nodules palpated; no lymphadenopathy. CHEST:  Coarse BS w/ no wheezing, no rales, no signs of consolidation. HEART:  Regular Rhythm; gr 1/6 SEM without rubs or  gallops detected... ABDOMEN:  Soft & nontender; normal bowel sounds; no organomegaly or masses palpated... EXT: without deformities, mod arthritic changes; s/p right THR; no varicose veins/ +venous insuffic/  tr edema . BACK:  sl tender along right PSIS area... NEURO:  CN's intact; no focal neuro defict... DERM:  no lesions seen...  RADIOLOGY DATA:  Reviewed in the EPIC EMR & discussed w/ the patient...    >>CXR 5/12 showed mod sized HH, stable cardiomeg, bibasilar atx, osteopenia, NAD...  LABORATORY DATA:  Reviewed in the EPIC EMR & discussed w/ the patient...    >>LABS 2/13:  FLP- great on Cres20;  Chems- ok w/ BUN=26 Creat=1.5;  CBC- wnl x MCV=78 Fe=66;  TSH=2.81;  VitD=20 & rec to incr supplement.   Assessment & Plan:    AB>  Improved overall & she had stopped all meds on her own=> we decided to try the COMBIVENT respimat for prn use...  HBP>  Fair control on the Losartan & Amlodipine; asked to monitor BP at home...  ASHD>  Followed by [facility] for Cards...  AFlutter>  DrHarwani has her on Amio to 1 tab daily; her prev thyroid problems have resolved...  CHOL>  On Cres20 w/ good control of FLP...  THYROID DISEASE>  Prev Amio induced hyperthyroidism managed by DrKumar (on PTU prev) & resolved on lower dose of Amio...  GI>  Hx HH/ GERD/ Divertics/ Polyps>  She is followed by DrJacobs for GI, continue H2blocker Rx...  DJD>  She is followed by DrDean for Ortho...  Anemia>  Hg is OK but MCV is low, Fe=66 & she will continue her Fe & VitC supplements...  Anxiety>  Requesting a mild nerve pill & we wrote for Alprazolam...   Patient's Medications  New Prescriptions   CLOPIDOGREL (PLAVIX) 75 MG TABLET    Take 1 tablet (75 mg total) by mouth daily with breakfast.   IPRATROPIUM-ALBUTEROL (COMBIVENT RESPIMAT) 20-100 MCG/ACT AERS RESPIMAT    Inhale 1 puff into the lungs every 6 (six) hours.  Previous Medications   ACETAMINOPHEN (TYLENOL) 325 MG TABLET    Take 325 mg by mouth every 6  (six) hours as needed. For pain   ALPRAZOLAM (XANAX) 0.25 MG TABLET    Take 0.25 mg by mouth 3 (three) times daily as needed  for anxiety.   AMIODARONE (PACERONE) 200 MG TABLET    Take 200 mg by mouth daily.    AMLODIPINE (NORVASC) 5 MG TABLET    Take 5 mg by mouth daily.   ASPIRIN 81 MG TABLET    Take 81 mg by mouth daily.    CHOLECALCIFEROL (VITAMIN D) 1000 UNITS CAPSULE    Take 1,000 Units by mouth daily.     FAMOTIDINE (PEPCID) 20 MG TABLET    Take 1 tablet by mouth Twice daily.   FERROUS GLUCONATE (FERGON) 325 MG TABLET    Take 325 mg by mouth daily with breakfast. With vitamin C tablet    ISOSORBIDE MONONITRATE (IMDUR) 30 MG 24 HR TABLET    Take 30 mg by mouth daily.   LOSARTAN (COZAAR) 100 MG TABLET    take 1 tablet by mouth once daily   NITROGLYCERIN (NITROSTAT) 0.4 MG SL TABLET    Place 0.4 mg under the tongue every 5 (five) minutes as needed. May repeat x3    ROSUVASTATIN (CRESTOR) 20 MG TABLET    Take 20 mg by mouth daily.    VITAMIN C (ASCORBIC ACID) 500 MG TABLET    Take 500 mg by mouth daily. With iron tablet    VITAMIN D, ERGOCALCIFEROL, (DRISDOL) 50000 UNITS CAPS    Take 1 capsule (50,000 Units total) by mouth every 7 (seven) days.  Modified Medications   No medications on file  Discontinued Medications   ALPRAZOLAM (XANAX) 0.25 MG TABLET    take 1 tablet by mouth three times a day if needed

## 2012-09-23 ENCOUNTER — Other Ambulatory Visit: Payer: Self-pay | Admitting: Pulmonary Disease

## 2012-09-23 ENCOUNTER — Encounter (HOSPITAL_COMMUNITY): Payer: Medicare Other

## 2012-09-23 MED ORDER — ALPRAZOLAM 0.25 MG PO TABS: ORAL_TABLET | ORAL | Status: DC

## 2012-09-23 NOTE — Telephone Encounter (Signed)
Received faxed refill for Alprazolam 0.25mg  TID PRN  Last refills 03/31/12 for #90 w/ 0 refills Last ov 7.22.14 w/ SN  Per Leigh, okay to refill with 5 additional refills ATC [facility] and speak with pharmacist, but line kept circling back to the automated message Voice message left on pharmacy phone for pt's refill as authorized by [facility] Med list updated.

## 2012-09-25 ENCOUNTER — Other Ambulatory Visit (HOSPITAL_COMMUNITY): Payer: Self-pay | Admitting: Cardiology

## 2012-09-25 ENCOUNTER — Encounter (HOSPITAL_COMMUNITY): Payer: Medicare Other

## 2012-09-25 DIAGNOSIS — R079 Chest pain, unspecified: Secondary | ICD-10-CM

## 2012-09-28 ENCOUNTER — Encounter (HOSPITAL_COMMUNITY): Payer: Medicare Other

## 2012-09-29 ENCOUNTER — Other Ambulatory Visit: Payer: Self-pay

## 2012-09-29 ENCOUNTER — Encounter (HOSPITAL_COMMUNITY)
Admission: RE | Admit: 2012-09-29 | Discharge: 2012-09-29 | Disposition: A | Discharge status: Home / Self Care | Payer: Medicare Other | Source: Ambulatory Visit | Attending: Cardiology | Admitting: Cardiology

## 2012-09-29 DIAGNOSIS — R079 Chest pain, unspecified: Secondary | ICD-10-CM | POA: Insufficient documentation

## 2012-09-29 MED ORDER — TECHNETIUM TC 99M SESTAMIBI GENERIC - CARDIOLITE
10.0000 | Freq: Once | INTRAVENOUS | Status: AC | PRN
  Administered 2012-09-29: 10 via INTRAVENOUS

## 2012-09-29 MED ORDER — TECHNETIUM TC 99M SESTAMIBI GENERIC - CARDIOLITE
30.0000 | Freq: Once | INTRAVENOUS | Status: AC | PRN
  Administered 2012-09-29: 30 via INTRAVENOUS

## 2012-09-29 MED ORDER — REGADENOSON 0.4 MG/5ML IV SOLN
INTRAVENOUS | Status: AC
  Administered 2012-09-29: 0.4 mg
  Filled 2012-09-29: qty 5

## 2012-09-30 ENCOUNTER — Encounter (HOSPITAL_COMMUNITY): Payer: Medicare Other

## 2012-10-02 ENCOUNTER — Encounter (HOSPITAL_COMMUNITY): Payer: Medicare Other | Attending: Cardiology

## 2012-10-02 DIAGNOSIS — I4891 Unspecified atrial fibrillation: Secondary | ICD-10-CM | POA: Insufficient documentation

## 2012-10-02 DIAGNOSIS — Z9861 Coronary angioplasty status: Secondary | ICD-10-CM | POA: Insufficient documentation

## 2012-10-02 DIAGNOSIS — Z5189 Encounter for other specified aftercare: Secondary | ICD-10-CM | POA: Insufficient documentation

## 2012-10-02 DIAGNOSIS — I209 Angina pectoris, unspecified: Secondary | ICD-10-CM | POA: Insufficient documentation

## 2012-10-02 DIAGNOSIS — I2510 Atherosclerotic heart disease of native coronary artery without angina pectoris: Secondary | ICD-10-CM | POA: Insufficient documentation

## 2012-10-02 DIAGNOSIS — I252 Old myocardial infarction: Secondary | ICD-10-CM | POA: Insufficient documentation

## 2012-10-05 ENCOUNTER — Encounter (HOSPITAL_COMMUNITY): Payer: Self-pay | Admitting: Pharmacy Technician

## 2012-10-05 ENCOUNTER — Encounter (HOSPITAL_COMMUNITY): Payer: Medicare Other

## 2012-10-06 ENCOUNTER — Encounter (HOSPITAL_COMMUNITY): Payer: Self-pay | Admitting: General Practice

## 2012-10-06 ENCOUNTER — Encounter (HOSPITAL_COMMUNITY)
Admission: RE | Disposition: A | Discharge status: Home / Self Care | Payer: Self-pay | Source: Ambulatory Visit | Attending: Cardiology

## 2012-10-06 ENCOUNTER — Ambulatory Visit (HOSPITAL_COMMUNITY)
Admission: RE | Admit: 2012-10-06 | Discharge: 2012-10-07 | Disposition: A | Discharge status: Home / Self Care | Payer: Medicare Other | Source: Ambulatory Visit | Attending: Cardiology | Admitting: Cardiology

## 2012-10-06 DIAGNOSIS — Z9861 Coronary angioplasty status: Secondary | ICD-10-CM

## 2012-10-06 DIAGNOSIS — Y831 Surgical operation with implant of artificial internal device as the cause of abnormal reaction of the patient, or of later complication, without mention of misadventure at the time of the procedure: Secondary | ICD-10-CM | POA: Insufficient documentation

## 2012-10-06 DIAGNOSIS — M1990 Unspecified osteoarthritis, unspecified site: Secondary | ICD-10-CM

## 2012-10-06 DIAGNOSIS — D649 Anemia, unspecified: Secondary | ICD-10-CM | POA: Insufficient documentation

## 2012-10-06 DIAGNOSIS — Z79899 Other long term (current) drug therapy: Secondary | ICD-10-CM | POA: Insufficient documentation

## 2012-10-06 DIAGNOSIS — I252 Old myocardial infarction: Secondary | ICD-10-CM | POA: Insufficient documentation

## 2012-10-06 DIAGNOSIS — K219 Gastro-esophageal reflux disease without esophagitis: Secondary | ICD-10-CM | POA: Insufficient documentation

## 2012-10-06 DIAGNOSIS — E7800 Pure hypercholesterolemia, unspecified: Secondary | ICD-10-CM | POA: Insufficient documentation

## 2012-10-06 DIAGNOSIS — I2510 Atherosclerotic heart disease of native coronary artery without angina pectoris: Secondary | ICD-10-CM | POA: Insufficient documentation

## 2012-10-06 DIAGNOSIS — I200 Unstable angina: Secondary | ICD-10-CM | POA: Insufficient documentation

## 2012-10-06 DIAGNOSIS — I10 Essential (primary) hypertension: Secondary | ICD-10-CM | POA: Insufficient documentation

## 2012-10-06 HISTORY — PX: LEFT HEART CATHETERIZATION WITH CORONARY ANGIOGRAM: SHX5451

## 2012-10-06 HISTORY — DX: Acute myocardial infarction, unspecified: I21.9

## 2012-10-06 HISTORY — DX: Gastro-esophageal reflux disease without esophagitis: K21.9

## 2012-10-06 HISTORY — DX: Shortness of breath: R06.02

## 2012-10-06 HISTORY — DX: Pneumonia, unspecified organism: J18.9

## 2012-10-06 HISTORY — PX: CORONARY ANGIOPLASTY: SHX604

## 2012-10-06 HISTORY — DX: Personal history of other medical treatment: Z92.89

## 2012-10-06 SURGERY — LEFT HEART CATHETERIZATION WITH CORONARY ANGIOGRAM
Anesthesia: LOCAL

## 2012-10-06 MED ORDER — AMIODARONE HCL 200 MG PO TABS
200.0000 mg | ORAL_TABLET | Freq: Every day | ORAL | Status: DC
  Administered 2012-10-06 – 2012-10-07 (×2): 200 mg via ORAL
  Filled 2012-10-06 (×2): qty 1

## 2012-10-06 MED ORDER — ASPIRIN 81 MG PO CHEW
81.0000 mg | CHEWABLE_TABLET | Freq: Every day | ORAL | Status: DC
  Administered 2012-10-07: 09:00:00 81 mg via ORAL
  Filled 2012-10-06: qty 1

## 2012-10-06 MED ORDER — ASPIRIN 81 MG PO CHEW
CHEWABLE_TABLET | ORAL | Status: AC
  Filled 2012-10-06: qty 4

## 2012-10-06 MED ORDER — IPRATROPIUM-ALBUTEROL 20-100 MCG/ACT IN AERS
1.0000 | INHALATION_SPRAY | Freq: Four times a day (QID) | RESPIRATORY_TRACT | Status: DC
  Administered 2012-10-06 – 2012-10-07 (×3): 1 via RESPIRATORY_TRACT
  Filled 2012-10-06: qty 4

## 2012-10-06 MED ORDER — VITAMIN D (ERGOCALCIFEROL) 1.25 MG (50000 UNIT) PO CAPS
50000.0000 [IU] | ORAL_CAPSULE | ORAL | Status: DC
  Administered 2012-10-06: 50000 [IU] via ORAL
  Filled 2012-10-06: qty 1

## 2012-10-06 MED ORDER — FERROUS GLUCONATE 324 (38 FE) MG PO TABS
325.0000 mg | ORAL_TABLET | Freq: Every day | ORAL | Status: DC
  Administered 2012-10-07: 09:00:00 324 mg via ORAL
  Filled 2012-10-06 (×2): qty 1

## 2012-10-06 MED ORDER — AMLODIPINE BESYLATE 5 MG PO TABS
5.0000 mg | ORAL_TABLET | Freq: Every day | ORAL | Status: DC
  Administered 2012-10-07: 5 mg via ORAL
  Filled 2012-10-06: qty 1

## 2012-10-06 MED ORDER — MIDAZOLAM HCL 2 MG/2ML IJ SOLN
INTRAMUSCULAR | Status: AC
  Filled 2012-10-06: qty 2

## 2012-10-06 MED ORDER — NITROGLYCERIN 0.4 MG SL SUBL: 0.4000 mg | SUBLINGUAL_TABLET | SUBLINGUAL | Status: DC | PRN

## 2012-10-06 MED ORDER — NITROGLYCERIN IN D5W 200-5 MCG/ML-% IV SOLN
10.0000 ug/min | INTRAVENOUS | Status: DC
  Filled 2012-10-06: qty 250

## 2012-10-06 MED ORDER — BIVALIRUDIN 250 MG IV SOLR
INTRAVENOUS | Status: AC
  Filled 2012-10-06: qty 250

## 2012-10-06 MED ORDER — CLOPIDOGREL BISULFATE 75 MG PO TABS
75.0000 mg | ORAL_TABLET | Freq: Every day | ORAL | Status: DC
  Administered 2012-10-07: 09:00:00 75 mg via ORAL
  Filled 2012-10-06: qty 1

## 2012-10-06 MED ORDER — ASPIRIN 81 MG PO CHEW: 81.0000 mg | CHEWABLE_TABLET | Freq: Every day | ORAL | Status: DC

## 2012-10-06 MED ORDER — ATORVASTATIN CALCIUM 80 MG PO TABS
80.0000 mg | ORAL_TABLET | Freq: Every day | ORAL | Status: DC
  Administered 2012-10-06: 21:00:00 80 mg via ORAL
  Filled 2012-10-06 (×2): qty 1

## 2012-10-06 MED ORDER — CLOPIDOGREL BISULFATE 300 MG PO TABS
ORAL_TABLET | ORAL | Status: AC
  Filled 2012-10-06: qty 2

## 2012-10-06 MED ORDER — SODIUM CHLORIDE 0.9 % IV SOLN
0.2500 mg/kg/h | INTRAVENOUS | Status: AC
  Filled 2012-10-06: qty 250

## 2012-10-06 MED ORDER — ALPRAZOLAM 0.25 MG PO TABS
0.2500 mg | ORAL_TABLET | Freq: Three times a day (TID) | ORAL | Status: DC | PRN
  Administered 2012-10-07: 04:00:00 0.25 mg via ORAL
  Filled 2012-10-06: qty 1

## 2012-10-06 MED ORDER — SODIUM CHLORIDE 0.9 % IV SOLN: INTRAVENOUS | Status: AC

## 2012-10-06 MED ORDER — SODIUM CHLORIDE 0.9 % IJ SOLN: 3.0000 mL | INTRAMUSCULAR | Status: DC | PRN

## 2012-10-06 MED ORDER — NITROGLYCERIN 0.2 MG/ML ON CALL CATH LAB
INTRAVENOUS | Status: AC
  Filled 2012-10-06: qty 1

## 2012-10-06 MED ORDER — FENTANYL CITRATE 0.05 MG/ML IJ SOLN
INTRAMUSCULAR | Status: AC
  Filled 2012-10-06: qty 2

## 2012-10-06 MED ORDER — FAMOTIDINE 20 MG PO TABS
20.0000 mg | ORAL_TABLET | Freq: Two times a day (BID) | ORAL | Status: DC
  Administered 2012-10-06 – 2012-10-07 (×3): 20 mg via ORAL
  Filled 2012-10-06 (×4): qty 1

## 2012-10-06 MED ORDER — ACETAMINOPHEN 325 MG PO TABS: 650.0000 mg | ORAL_TABLET | ORAL | Status: DC | PRN

## 2012-10-06 MED ORDER — LOSARTAN POTASSIUM 50 MG PO TABS
50.0000 mg | ORAL_TABLET | Freq: Every day | ORAL | Status: DC
  Administered 2012-10-07: 50 mg via ORAL
  Filled 2012-10-06 (×2): qty 1

## 2012-10-06 MED ORDER — ONDANSETRON HCL 4 MG/2ML IJ SOLN: 4.0000 mg | Freq: Four times a day (QID) | INTRAMUSCULAR | Status: DC | PRN

## 2012-10-06 MED ORDER — DIAZEPAM 5 MG PO TABS
5.0000 mg | ORAL_TABLET | ORAL | Status: AC
  Administered 2012-10-06: 5 mg via ORAL

## 2012-10-06 MED ORDER — SODIUM CHLORIDE 0.9 % IV SOLN
INTRAVENOUS | Status: DC
  Administered 2012-10-06: 07:00:00 via INTRAVENOUS

## 2012-10-06 MED ORDER — DIAZEPAM 5 MG PO TABS
ORAL_TABLET | ORAL | Status: AC
  Filled 2012-10-06: qty 1

## 2012-10-06 MED ORDER — SODIUM CHLORIDE 0.9 % IV SOLN: 250.0000 mL | INTRAVENOUS | Status: DC | PRN

## 2012-10-06 MED ORDER — NITROGLYCERIN IN D5W 200-5 MCG/ML-% IV SOLN
INTRAVENOUS | Status: AC
  Filled 2012-10-06: qty 250

## 2012-10-06 MED ORDER — HEPARIN (PORCINE) IN NACL 2-0.9 UNIT/ML-% IJ SOLN
INTRAMUSCULAR | Status: AC
  Filled 2012-10-06: qty 1000

## 2012-10-06 MED ORDER — SODIUM CHLORIDE 0.9 % IJ SOLN: 3.0000 mL | Freq: Two times a day (BID) | INTRAMUSCULAR | Status: DC

## 2012-10-06 MED ORDER — FAMOTIDINE IN NACL 20-0.9 MG/50ML-% IV SOLN
INTRAVENOUS | Status: AC
  Filled 2012-10-06: qty 50

## 2012-10-06 MED ORDER — LIDOCAINE HCL (PF) 1 % IJ SOLN
INTRAMUSCULAR | Status: AC
  Filled 2012-10-06: qty 30

## 2012-10-06 MED ORDER — ASPIRIN 81 MG PO CHEW
324.0000 mg | CHEWABLE_TABLET | ORAL | Status: AC
  Administered 2012-10-06: 324 mg via ORAL

## 2012-10-06 MED ORDER — ASPIRIN 81 MG PO TABS: 81.0000 mg | ORAL_TABLET | Freq: Every day | ORAL | Status: DC

## 2012-10-06 NOTE — Cardiovascular Report (Signed)
NAMEMarland Kitchen  Virginia Casey, Virginia Casey                  ACCOUNT NO.:  [id number]  MEDICAL RECORD NO.:  [id number]  LOCATION:  6C01C                        FACILITY:  [facility]  PHYSICIAN:  Eduardo Osier. Sharyn Lull, M.D. DATE OF BIRTH:  03/01/1925  DATE OF PROCEDURE:  10/06/2012 DATE OF DISCHARGE:                           CARDIAC CATHETERIZATION   PROCEDURE: 1. Left cardiac catheterization with selective left and right coronary     angiography, left ventriculography via right groin using Judkins     technique. 2. Successful percutaneous transluminal coronary angioplasty to mid     left anterior descending using AngioSculpt balloon in mid left     anterior descending.  INDICATION FOR THE PROCEDURE:  Virginia Casey is an 79 year old black female with past medical history significant for coronary artery disease, history of non-Q-wave myocardial infarction in the past, status post PTCA stenting to mid LAD, hypertension, history of paroxysmal AFib, hypercholesteremia, history of GI bleeding in the past, GERD, and degenerative joint disease, complains of retrosternal chest pain associated with mild shortness of breath with minimal exertion, relieves with sublingual nitro and rest.  The patient denies any nausea, vomiting, or diaphoresis.  Denies PND, orthopnea, or leg swelling.  The patient denies any palpitation, lightheadedness, or syncope.  The patient underwent Lexiscan Myoview on September 29, 2012, which showed distal lateral wall reversible ischemia with hyperdynamic EF of 84%.  Due to typical anginal chest pain, positive Lexiscan Myoview, and multiple risk factors, discussed with the patient regarding left cath, possible PTCA stenting, its risks and benefits, i.e., death, MI, stroke, need for emergency CABG, local vascular complications, etc. and consented for PCI.  PROCEDURE:  After obtaining the informed consent, the patient was brought to the cath lab and was placed on fluoroscopy table.  Right groin was  prepped and draped in usual fashion.  Xylocaine 1% was used for local anesthesia in the right groin.  With the help of thin-wall needle, 5-French arterial sheath was placed.  The sheath was aspirated and flushed.  A 5-French left Judkins catheter was advanced over the wire under fluoroscopic guidance up to the ascending aorta.  Wire was pulled out. The catheter was aspirated and connected to the Manifold.  Catheter was further advanced and engaged into left coronary ostium.  Multiple views of the left system were taken.  Next, catheter was disengaged and was pulled out over the wire and was replaced with 5-French right Judkins catheter, which was advanced over the wire under fluoroscopic guidance up to the ascending aorta.  Wire was pulled out, the catheter was aspirated, and connected to the Manifold.  Catheter was further advanced and engaged into right coronary ostium.  Multiple views of the right system were taken.  Next, the catheter was disengaged and was pulled out over the wire and was replaced with 5-French pigtail catheter, which was advanced over the wire under fluoroscopic guidance up to the ascending aorta.  Wire was pulled out.  The catheter was aspirated and connected to the Manifold.  Catheter was further advanced across the aortic valve into the LV.  LV pressures were recorded.  LV graphy was done in 30-degree RAO position.  Post-angiographic pressures were recorded from  LV and then pullback pressures were recorded from the aorta.  There was no gradient across the aortic valve.  Next, pigtail catheter was pulled out over the wire.  Sheaths were aspirated and flushed.  FINDINGS:  LV showed good LV systolic function, EF of 60% to 70%.  Left main was patent.  LAD has 20% to 30% ostial stenosis and 80% to 85% mid in-stent restenosis.  Ramus has mild disease.  Left circumflex has mild disease.  OM 1 is small, which is patent.  RCA has 10% to 15% proximal and distal  disease.  PDA and PLV branches were patent.  Also diagonal 1 was small, which was patent.  Diagonal 2 was very, very small.  INTERVENTIONAL PROCEDURE:  Successful PTCA to mid LAD in-stent restenosis was done using 3.0 x 6 mm long AngioSculpt PTCA balloon going up to 8 atmospheric pressure.  Three inflations were done.  Lesion dilated from 80% to 85% to 0% residual with excellent TIMI grade 3 distal flow without evidence of dissection or distal embolization.  The patient received weight-based Angiomax and 600 mg of Plavix prior to the procedure.  The patient tolerated the procedure well.  There were no complications.  The patient was transferred to recovery room in stable condition.     Eduardo Osier. Sharyn Lull, M.D.     MNH/MEDQ  D:  10/06/2012  T:  10/06/2012  Job:  [id number]

## 2012-10-06 NOTE — Progress Notes (Signed)
Subjective:  Doing well denies any chest pain or shortness of breath. Tolerated PCI to mid LAD with excellent results  Objective:  Vital Signs in the last 24 hours: Temp:  [97.4 F (36.3 C)-98 F (36.7 C)] 97.4 F (36.3 C) (08/05 0930) Pulse Rate:  [52-61] 52 (08/05 0930) Resp:  [16-18] 16 (08/05 0930) BP: (143-154)/(55-74) 143/55 mmHg (08/05 0930) SpO2:  [92 %-97 %] 92 % (08/05 0930) Weight:  [78.472 kg (173 lb)] 78.472 kg (173 lb) (08/05 0553)  Intake/Output from previous day:   Intake/Output from this shift:    Physical Exam: Neck: no adenopathy, no carotid bruit, no JVD and supple, symmetrical, trachea midline Lungs: clear to auscultation bilaterally Heart: regular rate and rhythm, S1, S2 normal and Soft systolic murmur and S4 gallop noted Abdomen: soft, non-tender; bowel sounds normal; no masses,  no organomegaly Extremities: extremities normal, atraumatic, no cyanosis or edema and Right groin dressing dry  Lab Results: No results found for this basename: WBC, HGB, PLT,  in the last 72 hours No results found for this basename: NA, K, CL, CO2, GLUCOSE, BUN, CREATININE,  in the last 72 hours No results found for this basename: TROPONINI, CK, MB,  in the last 72 hours Hepatic Function Panel No results found for this basename: PROT, ALBUMIN, AST, ALT, ALKPHOS, BILITOT, BILIDIR, IBILI,  in the last 72 hours No results found for this basename: CHOL,  in the last 72 hours No results found for this basename: PROTIME,  in the last 72 hours  Imaging: Imaging results have been reviewed and No results found.  Cardiac Studies:  Assessment/Plan:  New-onset angina status post PCI to in-stent restenosis to mid LAD with excellent results Coronary artery disease history of non-Q-wave MI in the past status post PTCA stenting to LAD in the past Hypertension Hypercholesteremia History of paroxysmal atrial fibrillation. History of GI bleeding in the past GERD Degenerative joint  disease Plan Continue present management. Check labs in a.m.   LOS: 0 days    Virginia Casey N 10/06/2012, 10:38 AM

## 2012-10-06 NOTE — H&P (Signed)
  Handwritten H&P in the chart needs to be scanned. 

## 2012-10-06 NOTE — Progress Notes (Addendum)
IV NOT REMOVED CHARTED BY MISTAKE

## 2012-10-06 NOTE — Progress Notes (Signed)
Site area: right groin  Site Prior to Removal:  Level 0  Pressure Applied For 20 MINUTES    Minutes Beginning at 1300  Manual:   yes  Patient Status During Pull:  stable  Post Pull Groin Site:  Level 0  Post Pull Instructions Given:  yes  Post Pull Pulses Present:  yes  Dressing Applied:  yes  Comments:  Sheath pulled by Stefano Gaul

## 2012-10-06 NOTE — CV Procedure (Signed)
Left cardiac cath/PTCA stenting report dictated on 10/06/2012 dictation number is [id number]

## 2012-10-07 ENCOUNTER — Encounter (HOSPITAL_COMMUNITY): Payer: Medicare Other

## 2012-10-07 LAB — BASIC METABOLIC PANEL
CO2: 23 mEq/L (ref 19–32)
Chloride: 105 mEq/L (ref 96–112)
Sodium: 138 mEq/L (ref 135–145)

## 2012-10-07 LAB — CBC
Platelets: 116 10*3/uL — ABNORMAL LOW (ref 150–400)
RBC: 4.6 MIL/uL (ref 3.87–5.11)
WBC: 5.4 10*3/uL (ref 4.0–10.5)

## 2012-10-07 LAB — POCT ACTIVATED CLOTTING TIME: Activated Clotting Time: 344 seconds

## 2012-10-07 MED ORDER — ALUM & MAG HYDROXIDE-SIMETH 200-200-20 MG/5ML PO SUSP
30.0000 mL | Freq: Four times a day (QID) | ORAL | Status: DC | PRN
  Filled 2012-10-07: qty 30

## 2012-10-07 MED ORDER — CLOPIDOGREL BISULFATE 75 MG PO TABS: 75.0000 mg | ORAL_TABLET | Freq: Every day | ORAL | Status: DC

## 2012-10-07 MED ORDER — IPRATROPIUM-ALBUTEROL 20-100 MCG/ACT IN AERS: 1.0000 | INHALATION_SPRAY | Freq: Four times a day (QID) | RESPIRATORY_TRACT | Status: DC | PRN

## 2012-10-07 MED FILL — Sodium Chloride IV Soln 0.9%: INTRAVENOUS | Qty: 50 | Status: AC

## 2012-10-07 NOTE — Progress Notes (Signed)
Changed to PRN per pt. Pt states she does not like treatments, only takes when needed

## 2012-10-07 NOTE — Progress Notes (Signed)
CARDIAC REHAB PHASE I   PRE:  Rate/Rhythm: 55SB  BP:  Supine:   Sitting: 162/74  Standing:    SaO2:   MODE:  Ambulation: 300 ft   POST:  Rate/Rhythm: 69SR  BP:  Supine:   Sitting: 148/50  Standing:    SaO2:  [id number] Pt walked 300 ft with hand held asst with very slow pace. Denied CP. Tolerated well. To chair after walk. Tired. Pt agrees to referral to CRP 2 [place] as she is in maintenance program now. Brief review of NTG use and exercise. Pt has attended diet classes in Cardiac Rehab. Encouraged walking as tolerated.  Call bell in reach.   Luetta Nutting, RN BSN  10/07/2012 8:23 AM

## 2012-10-07 NOTE — Discharge Summary (Signed)
NAMEMarland Kitchen  LYRIC, Virginia Casey                  ACCOUNT NO.:  [id number]  MEDICAL RECORD NO.:  [id number]  LOCATION:  6C01C                        FACILITY:  [facility]  PHYSICIAN:  Eduardo Osier. Sharyn Lull, M.D. DATE OF BIRTH:  01-04-1925  DATE OF ADMISSION:  10/06/2012 DATE OF DISCHARGE:  10/07/2012                              DISCHARGE SUMMARY   ADMITTING DIAGNOSES: 1. New onset angina, positive Lexiscan Myoview to rule out progression     of coronary artery disease. 2. Coronary artery disease, history of non-Q-wave myocardial     infarction in the past, status post PCI to LAD in the past. 3. Hypertension. 4. Hypercholesteremia. 5. History of paroxysmal atrial fibrillation. 6. History of gastrointestinal bleed in the past. 7. Chronic anemia.  DISCHARGE DIAGNOSES: 1. New onset angina, positive Lexiscan Myoview, status post left     cardiac cath/PTCA to in-stent restenosis of mid LAD. 2. Coronary artery disease, history of non-Q-wave myocardial     infarction in the past, status post PTCA stenting to mid LAD in the     past. 3. Hypertension. 4. Hypercholesteremia. 5. History of atrial fibrillation in the past. 6. History of gastrointestinal bleeding in the past. 7. Chronic anemia.  DISCHARGE HOME MEDICATIONS: 1. Clopidogrel 75 mg 1 tablet daily. 2. Acetaminophen 325 mg every 6 hours for pain as before. 3. Xanax 0.25 mg 3 times daily as before. 4. Amiodarone 200 mg 1 tablet daily. 5. Amlodipine 5 mg 1 tablet daily. 6. Enteric-coated aspirin 81 mg 1 tablet daily. 7. Pepcid 20 mg twice daily. 8. Ferrous gluconate 325 mg daily. 9. Combivent inhaler every 6 hours as before. 10.Imdur 30 mg 1 tablet daily. 11.Losartan 100 mg 1 tablet daily. 12.Nitrostat 0.4 mg sublingual use as directed. 13.Crestor 20 mg 1 tablet daily. 14.Vitamin C 500 mg daily. 15.Vitamin D 50,000 units 1 capsule every 7 days. 16.Vitamin D 1 capsule 1000 units every day.  DIET:  Low salt, low cholesterol.  ACTIVITY:   Increase activity slowly as tolerated.  The patient will be scheduled for phase 2 cardiac rehab as outpatient.  Post PTCA instructions have been given.  Follow up with me in 1 week.  CONDITION AT DISCHARGE:  Stable.  BRIEF HISTORY AND HOSPITAL COURSE:  Virginia Casey is an 79 year old black female with past medical history significant for coronary artery disease, history of non-Q-wave myocardial infarction approximately 4 years ago, status post PTCA stenting to LAD in 2010, hypertension, history of paroxysmal atrial fibrillation, hypercholesteremia, history of GI bleeding in the past, GERD, degenerative joint disease, complains of retrosternal chest pain associated with mild shortness of breath with minimal exertion, relieves with sublingual nitro and rest.  The patient denies any nausea, vomiting, or diaphoresis.  Denies PND, orthopnea, or leg swelling.  Denies palpitation, lightheadedness, or syncope.  The patient underwent Lexiscan Myoview on July 29, which showed distal lateral wall reversible ischemia with hyperdynamic EF of 84%.  PAST MEDICAL HISTORY:  As above.  PAST SURGICAL HISTORY:  She had right hip surgery in 2009.  SOCIAL HISTORY:  She is widowed, retired, worked as a Dispensing optician in the past.  No history of smoking or alcohol abuse.  ALLERGIES:  No known drug  allergies.  MEDICATION AT HOME: 1. She is on enteric-coated aspirin 81 mg daily. 2. Amiodarone 200 mg daily. 3. Norvasc 5 mg daily. 4. Imdur 30 mg daily. 5. Losartan 100 mg daily. 6. Pepcid 20 mg twice daily. 7. Crestor 10 mg daily. 8. Xanax 0.25 mg twice daily. 9. Combivent inhaler p.r.n.  FAMILY HISTORY:  Noncontributory.  PHYSICAL EXAMINATION:  GENERAL:  She was alert, awake, oriented x3, in no acute distress. VITAL SIGNS:  Blood pressure was 140/90, pulse was 54. EYES:  Conjunctivae was pink. NECK:  Supple.  No JVD.  No bruit. LUNGS:  Clear to auscultation without rhonchi or rales. CARDIOVASCULAR:  S1,  S2 was normal.  There was soft systolic murmur.  No S3 gallop. ABDOMEN:  Soft.  Bowel sounds were present.  Nontender. EXTREMITIES:  There is no clubbing, cyanosis, or edema.  LABORATORY DATA:  Sodium was 140, potassium 4.1, BUN 19, creatinine 1.2. Repeat creatinine today is also 1.2, hemoglobin was 13.3, hematocrit 41, white count of 5.9, platelet count was 113,000; repeat platelet count today is 116,000, hemoglobin was 11.6, hematocrit 32.8, white count of 5.4.  Chest x-ray showed cardiomegaly without acute disease.  Large hiatal hernia.  EKG showed sinus bradycardia with nonspecific T-wave abnormalities.  BRIEF HOSPITAL COURSE:  The patient was admitted and underwent left cardiac cath with selective left and right coronary angiography and PTCA to mid LAD in-stent restenosis using AngioSculpt balloon with excellent result.  The patient tolerated the procedure well.  There were no complications.  Postprocedure, the patient did not have any episodes of chest pain during the hospital stay.  Her groin is stable with no evidence of hematoma or bruit.  The patient has been ambulating in room without any problems.  The patient will be discharged home on above medications and will be followed up in my office in 1 week.  Post PCI instructions have been given.  The patient has been also counseled regarding lifestyle modification, diet and exercise.     Eduardo Osier. Sharyn Lull, M.D.     [facility]  D:  10/07/2012  T:  10/07/2012  Job:  [id number]

## 2012-10-07 NOTE — Discharge Summary (Signed)
  Discharge summary dictated on 10/07/2012 dictation number is [phone]

## 2012-10-09 ENCOUNTER — Encounter (HOSPITAL_COMMUNITY): Payer: Medicare Other

## 2012-10-12 ENCOUNTER — Encounter (HOSPITAL_COMMUNITY): Payer: Medicare Other

## 2012-10-14 ENCOUNTER — Encounter (HOSPITAL_COMMUNITY): Payer: Medicare Other

## 2012-10-16 ENCOUNTER — Encounter (HOSPITAL_COMMUNITY): Payer: Medicare Other

## 2012-10-19 ENCOUNTER — Encounter (HOSPITAL_COMMUNITY): Payer: Self-pay

## 2012-10-19 ENCOUNTER — Encounter (HOSPITAL_COMMUNITY): Payer: Medicare Other

## 2012-10-21 ENCOUNTER — Encounter (HOSPITAL_COMMUNITY): Payer: Medicare Other

## 2012-10-21 ENCOUNTER — Encounter (HOSPITAL_COMMUNITY)
Admission: RE | Admit: 2012-10-21 | Discharge: 2012-10-21 | Disposition: A | Discharge status: Home / Self Care | Payer: Self-pay | Source: Ambulatory Visit | Attending: Cardiology | Admitting: Cardiology

## 2012-10-21 DIAGNOSIS — I4891 Unspecified atrial fibrillation: Secondary | ICD-10-CM | POA: Insufficient documentation

## 2012-10-21 DIAGNOSIS — Z5189 Encounter for other specified aftercare: Secondary | ICD-10-CM | POA: Insufficient documentation

## 2012-10-21 DIAGNOSIS — Z9861 Coronary angioplasty status: Secondary | ICD-10-CM | POA: Insufficient documentation

## 2012-10-21 DIAGNOSIS — I2510 Atherosclerotic heart disease of native coronary artery without angina pectoris: Secondary | ICD-10-CM | POA: Insufficient documentation

## 2012-10-21 NOTE — Progress Notes (Signed)
Reoriented patient to the cardiac rehab maintenance program. Medications reviewed.

## 2012-10-23 ENCOUNTER — Encounter (HOSPITAL_COMMUNITY)
Admission: RE | Admit: 2012-10-23 | Discharge: 2012-10-23 | Disposition: A | Discharge status: Home / Self Care | Payer: Self-pay | Source: Ambulatory Visit | Attending: Cardiology | Admitting: Cardiology

## 2012-10-23 ENCOUNTER — Encounter (HOSPITAL_COMMUNITY): Payer: Medicare Other

## 2012-10-26 ENCOUNTER — Encounter (HOSPITAL_COMMUNITY): Payer: Self-pay

## 2012-10-26 ENCOUNTER — Encounter (HOSPITAL_COMMUNITY): Payer: Medicare Other

## 2012-10-28 ENCOUNTER — Encounter (HOSPITAL_COMMUNITY): Payer: Medicare Other

## 2012-10-28 ENCOUNTER — Encounter (HOSPITAL_COMMUNITY): Payer: Self-pay

## 2012-10-30 ENCOUNTER — Encounter (HOSPITAL_COMMUNITY): Payer: Medicare Other

## 2012-10-30 ENCOUNTER — Encounter (HOSPITAL_COMMUNITY)
Admission: RE | Admit: 2012-10-30 | Discharge: 2012-10-30 | Disposition: A | Discharge status: Home / Self Care | Payer: Self-pay | Source: Ambulatory Visit | Attending: Cardiology | Admitting: Cardiology

## 2012-10-30 NOTE — Progress Notes (Signed)
Prior to exercise, patient indicated that she "blacked out" on Saturday at church. Patient has an appointment with Dr. Sharyn Lull next Thursday. Will hold exercise until after MD appointment next week.

## 2012-11-04 ENCOUNTER — Encounter (HOSPITAL_COMMUNITY): Payer: Medicare Other

## 2012-11-04 DIAGNOSIS — Z5189 Encounter for other specified aftercare: Secondary | ICD-10-CM | POA: Insufficient documentation

## 2012-11-04 DIAGNOSIS — I4891 Unspecified atrial fibrillation: Secondary | ICD-10-CM | POA: Insufficient documentation

## 2012-11-04 DIAGNOSIS — Z9861 Coronary angioplasty status: Secondary | ICD-10-CM | POA: Insufficient documentation

## 2012-11-04 DIAGNOSIS — I2510 Atherosclerotic heart disease of native coronary artery without angina pectoris: Secondary | ICD-10-CM | POA: Insufficient documentation

## 2012-11-06 ENCOUNTER — Encounter (HOSPITAL_COMMUNITY): Payer: Medicare Other

## 2012-11-09 ENCOUNTER — Encounter (HOSPITAL_COMMUNITY): Payer: Medicare Other

## 2012-11-11 ENCOUNTER — Encounter (HOSPITAL_COMMUNITY): Payer: Medicare Other

## 2012-11-11 ENCOUNTER — Encounter (HOSPITAL_COMMUNITY)
Admission: RE | Admit: 2012-11-11 | Discharge: 2012-11-11 | Disposition: A | Discharge status: Home / Self Care | Payer: Self-pay | Source: Ambulatory Visit | Attending: Cardiology | Admitting: Cardiology

## 2012-11-13 ENCOUNTER — Encounter (HOSPITAL_COMMUNITY): Payer: Medicare Other

## 2012-11-13 ENCOUNTER — Encounter (HOSPITAL_COMMUNITY)
Admission: RE | Admit: 2012-11-13 | Discharge: 2012-11-13 | Disposition: A | Discharge status: Home / Self Care | Payer: Self-pay | Source: Ambulatory Visit | Attending: Cardiology | Admitting: Cardiology

## 2012-11-15 ENCOUNTER — Emergency Department (HOSPITAL_COMMUNITY)
Admission: EM | Admit: 2012-11-15 | Discharge: 2012-11-15 | Disposition: A | Discharge status: Home / Self Care | Payer: Medicare Other | Attending: Emergency Medicine | Admitting: Emergency Medicine

## 2012-11-15 ENCOUNTER — Encounter (HOSPITAL_COMMUNITY): Payer: Self-pay | Admitting: Nurse Practitioner

## 2012-11-15 DIAGNOSIS — I2510 Atherosclerotic heart disease of native coronary artery without angina pectoris: Secondary | ICD-10-CM | POA: Insufficient documentation

## 2012-11-15 DIAGNOSIS — Z7902 Long term (current) use of antithrombotics/antiplatelets: Secondary | ICD-10-CM | POA: Insufficient documentation

## 2012-11-15 DIAGNOSIS — R0602 Shortness of breath: Secondary | ICD-10-CM | POA: Insufficient documentation

## 2012-11-15 DIAGNOSIS — K219 Gastro-esophageal reflux disease without esophagitis: Secondary | ICD-10-CM | POA: Insufficient documentation

## 2012-11-15 DIAGNOSIS — Z860100 Personal history of colon polyps, unspecified: Secondary | ICD-10-CM | POA: Insufficient documentation

## 2012-11-15 DIAGNOSIS — E559 Vitamin D deficiency, unspecified: Secondary | ICD-10-CM | POA: Insufficient documentation

## 2012-11-15 DIAGNOSIS — Z7982 Long term (current) use of aspirin: Secondary | ICD-10-CM | POA: Insufficient documentation

## 2012-11-15 DIAGNOSIS — I4892 Unspecified atrial flutter: Secondary | ICD-10-CM | POA: Insufficient documentation

## 2012-11-15 DIAGNOSIS — Z9861 Coronary angioplasty status: Secondary | ICD-10-CM | POA: Insufficient documentation

## 2012-11-15 DIAGNOSIS — Z8739 Personal history of other diseases of the musculoskeletal system and connective tissue: Secondary | ICD-10-CM | POA: Insufficient documentation

## 2012-11-15 DIAGNOSIS — I252 Old myocardial infarction: Secondary | ICD-10-CM | POA: Insufficient documentation

## 2012-11-15 DIAGNOSIS — F411 Generalized anxiety disorder: Secondary | ICD-10-CM | POA: Insufficient documentation

## 2012-11-15 DIAGNOSIS — I10 Essential (primary) hypertension: Secondary | ICD-10-CM | POA: Insufficient documentation

## 2012-11-15 DIAGNOSIS — Z8719 Personal history of other diseases of the digestive system: Secondary | ICD-10-CM | POA: Insufficient documentation

## 2012-11-15 DIAGNOSIS — Z79899 Other long term (current) drug therapy: Secondary | ICD-10-CM | POA: Insufficient documentation

## 2012-11-15 DIAGNOSIS — Z8701 Personal history of pneumonia (recurrent): Secondary | ICD-10-CM | POA: Insufficient documentation

## 2012-11-15 DIAGNOSIS — R1013 Epigastric pain: Secondary | ICD-10-CM | POA: Insufficient documentation

## 2012-11-15 DIAGNOSIS — E669 Obesity, unspecified: Secondary | ICD-10-CM | POA: Insufficient documentation

## 2012-11-15 DIAGNOSIS — E7800 Pure hypercholesterolemia, unspecified: Secondary | ICD-10-CM | POA: Insufficient documentation

## 2012-11-15 DIAGNOSIS — D649 Anemia, unspecified: Secondary | ICD-10-CM | POA: Insufficient documentation

## 2012-11-15 LAB — COMPREHENSIVE METABOLIC PANEL
ALT: 20 U/L (ref 0–35)
AST: 29 U/L (ref 0–37)
CO2: 24 mEq/L (ref 19–32)
Calcium: 10.2 mg/dL (ref 8.4–10.5)
Chloride: 101 mEq/L (ref 96–112)
GFR calc non Af Amer: 26 mL/min — ABNORMAL LOW (ref >90)
Sodium: 137 mEq/L (ref 135–145)

## 2012-11-15 LAB — CBC WITH DIFFERENTIAL/PLATELET
Eosinophils Absolute: 0.1 10*3/uL (ref 0.0–0.7)
Eosinophils Relative: 1 % (ref 0–5)
HCT: 37.2 % (ref 36.0–46.0)
Lymphs Abs: 2.9 10*3/uL (ref 0.7–4.0)
MCH: 25.4 pg — ABNORMAL LOW (ref 26.0–34.0)
MCV: 73.8 fL — ABNORMAL LOW (ref 78.0–100.0)
Monocytes Absolute: 0.6 10*3/uL (ref 0.1–1.0)
Monocytes Relative: 9 % (ref 3–12)
Platelets: 101 10*3/uL — ABNORMAL LOW (ref 150–400)
RBC: 5.04 MIL/uL (ref 3.87–5.11)

## 2012-11-15 LAB — URINE MICROSCOPIC-ADD ON

## 2012-11-15 LAB — URINALYSIS, ROUTINE W REFLEX MICROSCOPIC
Ketones, ur: NEGATIVE mg/dL
Protein, ur: NEGATIVE mg/dL
Urobilinogen, UA: 0.2 mg/dL (ref 0.0–1.0)

## 2012-11-15 LAB — TROPONIN I: Troponin I: <0.3 ng/mL (ref <0.30)

## 2012-11-15 MED ORDER — FAMOTIDINE IN NACL 20-0.9 MG/50ML-% IV SOLN
20.0000 mg | Freq: Once | INTRAVENOUS | Status: AC
  Administered 2012-11-15: 20 mg via INTRAVENOUS
  Filled 2012-11-15: qty 50

## 2012-11-15 MED ORDER — OMEPRAZOLE 20 MG PO CPDR: 20.0000 mg | DELAYED_RELEASE_CAPSULE | Freq: Every day | ORAL | Status: DC

## 2012-11-15 MED ORDER — PANTOPRAZOLE SODIUM 40 MG IV SOLR
40.0000 mg | Freq: Once | INTRAVENOUS | Status: AC
  Administered 2012-11-15: 40 mg via INTRAVENOUS
  Filled 2012-11-15: qty 40

## 2012-11-15 MED ORDER — GI COCKTAIL ~~LOC~~
30.0000 mL | Freq: Once | ORAL | Status: AC
  Administered 2012-11-15: 30 mL via ORAL
  Filled 2012-11-15: qty 30

## 2012-11-15 NOTE — ED Provider Notes (Signed)
CSN: [id number]     Arrival date & time 11/15/12  1843 History   First MD Initiated Contact with Patient 11/15/12 1853     Chief Complaint  Patient presents with  . Abdominal Pain   (Consider location/radiation/quality/duration/timing/severity/associated sxs/prior Treatment) HPI History provided by pt.   Pt developed intermittent, non-radiating, burning epigastric pain 2 nights ago.  Pain is non-exertional and non-positional and not aggravated by eating.  Episodes occur at random. No associated fever, cough, CP, SOB, N/V/D, hematochezia/melena or urinary sx.  Per prior chart, pt has h/o MI and stent to LAD.  Was admitted 8/5-10/07/12 for SOB, had a positive myoview, was taken back to cath lab and found to have a restenosed stent.  Coronaries otherwise patent or with mild disease.  She underwent successful angiography.  Pt reports that she was not experiencing any chest or epigastric pain at time of admission and has been feeling well since, up until 2 days ago.  Has h/o acid reflux for which she has been compliant w/ daily pepcid, and current pain feels different than indigestion.  Has not recently had alcohol, NSAIDs, caffeine or spicy food.  Past Medical History  Diagnosis Date  . HTN (hypertension)   . Atherosclerotic heart disease   . Atrial flutter, paroxysmal   . PVD (peripheral vascular disease)   . Hypercholesterolemia   . Hiatal hernia   . Diverticulosis of colon   . Colon polyp   . Vitamin D deficiency   . Anxiety   . Anemia   . Myocardial infarction 2010  . Pneumonia     "once; years ago" (10/06/2012)  . Exertional shortness of breath   . History of blood transfusion ? 2010  . GERD (gastroesophageal reflux disease)   . DJD (degenerative joint disease)     "legs" (10/06/2012)   Past Surgical History  Procedure Laterality Date  . Total hip arthroplasty Right 12/09    Dr August Saucer  . Coronary angioplasty with stent placement  04/2008    "1; Dr Sharyn Lull" (10/06/2012)  . Coronary  angioplasty  10/06/2012  . Dilation and curettage of uterus     Family History  Problem Relation Age of Onset  . Heart disease Mother   . Heart disease Maternal Grandmother   . Heart disease Maternal Grandfather   . Rheum arthritis Mother   . Rheum arthritis Father   . Lung cancer Father   . Stroke Maternal Aunt    History  Substance Use Topics  . Smoking status: Never Smoker   . Smokeless tobacco: Never Used  . Alcohol Use: No   OB History   Grav Para Term Preterm Abortions TAB SAB Ect Mult Living                 Review of Systems  All other systems reviewed and are negative.    Allergies  Review of patient's allergies indicates no known allergies.  Home Medications   Current Outpatient Rx  Name  Route  Sig  Dispense  Refill  . acetaminophen (TYLENOL) 325 MG tablet   Oral   Take 325 mg by mouth every 6 (six) hours as needed. For pain         . ALPRAZolam (XANAX) 0.25 MG tablet   Oral   Take 0.25 mg by mouth 3 (three) times daily as needed for anxiety.         [name] amiodarone (PACERONE) 200 MG tablet   Oral   Take 200 mg by mouth daily.          [name]  amLODipine (NORVASC) 5 MG tablet   Oral   Take 5 mg by mouth daily.         [name] aspirin 81 MG tablet   Oral   Take 81 mg by mouth daily.          . Cholecalciferol (VITAMIN D) 1000 UNITS capsule   Oral   Take 1,000 Units by mouth daily.           . clopidogrel (PLAVIX) 75 MG tablet   Oral   Take 1 tablet (75 mg total) by mouth daily with breakfast.   30 tablet   3   . famotidine (PEPCID) 20 MG tablet   Oral   Take 1 tablet by mouth Twice daily.         . ferrous gluconate (FERGON) 325 MG tablet   Oral   Take 325 mg by mouth daily with breakfast. With vitamin C tablet          . Ipratropium-Albuterol (COMBIVENT RESPIMAT) 20-100 MCG/ACT AERS respimat   Inhalation   Inhale 1 puff into the lungs every 6 (six) hours.   4 g   11   . losartan (COZAAR) 100 MG tablet      take 1 tablet by  mouth once daily   30 tablet   11   . rosuvastatin (CRESTOR) 20 MG tablet   Oral   Take 20 mg by mouth daily.          . vitamin C (ASCORBIC ACID) 500 MG tablet   Oral   Take 500 mg by mouth daily. With iron tablet          . Vitamin D, Ergocalciferol, (DRISDOL) 50000 UNITS CAPS   Oral   Take 1 capsule (50,000 Units total) by mouth every 7 (seven) days.   4 capsule   11   . nitroGLYCERIN (NITROSTAT) 0.4 MG SL tablet   Sublingual   Place 0.4 mg under the tongue every 5 (five) minutes as needed. May repeat x3           BP 116/50  Pulse 67  Temp(Src) 97.8 F (36.6 C) (Oral)  Resp 18  Ht 5\' 2"  (1.575 m)  Wt 172 lb (78.019 kg)  BMI 31.45 kg/m2  SpO2 98% Physical Exam  Nursing note and vitals reviewed. Constitutional: She is oriented to person, place, and time. She appears well-developed and well-nourished. No distress.  HENT:  Head: Normocephalic and atraumatic.  Eyes:  Normal appearance  Neck: Normal range of motion.  Cardiovascular: Normal rate, regular rhythm and intact distal pulses.   Pulmonary/Chest: Effort normal and breath sounds normal. No respiratory distress. She exhibits no tenderness.  No pleuritic pain reported  Abdominal: Soft. Bowel sounds are normal. She exhibits no distension. There is no guarding.  Obese. Epigastric ttp  Musculoskeletal: Normal range of motion.  No peripheral edema or calf tenderness  Neurological: She is alert and oriented to person, place, and time.  Skin: Skin is warm and dry. No rash noted.  Psychiatric: She has a normal mood and affect. Her behavior is normal.    ED Course  Procedures (including critical care time)  Date: 11/15/2012  Rate: 63  Rhythm: normal sinus rhythm  QRS Axis: normal  Intervals: normal  ST/T Wave abnormalities: normal  Conduction Disutrbances:none  Narrative Interpretation:   Old EKG Reviewed: unchanged   Labs Review Labs Reviewed  COMPREHENSIVE METABOLIC PANEL - Abnormal; Notable for  the following:    Glucose, Bld 137 (*)  BUN 32 (*)    Creatinine, Ser 1.69 (*)    Total Protein 8.7 (*)    GFR calc non Af Amer 26 (*)    GFR calc Af Amer 31 (*)    All other components within normal limits  CBC WITH DIFFERENTIAL - Abnormal; Notable for the following:    MCV 73.8 (*)    MCH 25.4 (*)    RDW 15.6 (*)    All other components within normal limits  LIPASE, BLOOD  CBC WITH DIFFERENTIAL  URINALYSIS, ROUTINE W REFLEX MICROSCOPIC   Imaging Review No results found.  MDM   1. Epigastric pain    79yo F presents w/ epigastric pain x 2 days.  H/o MI w/ stent to LAD.  Admitted last month for SOB and underwent angiography for restenosis of stent; coronaries otherwise patent or w/ mild disease.  Has h/o acid reflux for which she has been compliant w/ daily pepcid, but this pain feels different.  No associated sx.  Doubt ACS; current sx atypical and reportedly different than what she had w/ MI and at time of recent admission for angiography, pain reproducible w/ palpation epigastrium and relieved w/ GI cocktail and IV pepcid/protonix, EKG non-ischemic and troponin negative.  Labs sig for AKI w/ CR 1.62.  Baseline 1.1-1.2.  Pt aware and I advised f/u with PCP.  Discussed case w/ Dr. Fayrene Fearing and he is comfortable w/ discharge. Strict return precautions discussed. 11:03 PM   Otilio Miu, PA-C 11/15/12 2320

## 2012-11-15 NOTE — ED Notes (Signed)
PA at bedside.

## 2012-11-15 NOTE — ED Notes (Signed)
Pt reports upper abd/epigastric pain onset yesterday, more severe today. States she has been nauseated and "burping a lot." pt was admitted here 3 weeks for cardiac issues and surgery, and she is concerned about her heart today. A&Ox4, resp e/u

## 2012-11-16 ENCOUNTER — Encounter (HOSPITAL_COMMUNITY): Payer: Medicare Other

## 2012-11-16 ENCOUNTER — Telehealth: Payer: Self-pay | Admitting: Pulmonary Disease

## 2012-11-16 LAB — URINE CULTURE
Colony Count: NO GROWTH
Culture: NO GROWTH

## 2012-11-16 MED ORDER — OMEPRAZOLE 20 MG PO CPDR: DELAYED_RELEASE_CAPSULE | ORAL | Status: DC

## 2012-11-16 NOTE — Telephone Encounter (Signed)
Pt is aware of recs. appt made and RX sent. Nothing further needed

## 2012-11-16 NOTE — Telephone Encounter (Signed)
I spoke with pt. She stated she went to ED yesterday bc she ahd bad attack of reflux. She stated she was changed to omeprazole 20 mg QD and was told to call her PCP bc he may want to see her. She does not have a GI physician per pt. Please advise SN thanks Pending 03/24/13

## 2012-11-16 NOTE — Telephone Encounter (Signed)
lmtcb x1 for pt. 

## 2012-11-16 NOTE — Telephone Encounter (Signed)
Per SN    recs to increase the omeprazole 20 mg  Bid 30 mins before breakfast and dinner  Elevated head of bed 6"  rov with SN in 1 month.  thanks

## 2012-11-18 ENCOUNTER — Encounter (HOSPITAL_COMMUNITY): Payer: Medicare Other

## 2012-11-18 NOTE — ED Provider Notes (Signed)
Medical screening examination/treatment/procedure(s) were conducted as a shared visit with non-physician practitioner(s) and myself.  I personally evaluated the patient during the encounter I personally saw and evaluated this patient. History is that of epigastric pain. She states the symptoms are nothing like the difficulty she experience with her angina last month. Her creatinine is slightly high will have her follow this with her physician in either yourself. Think her pain is epigastric in nature and likely GI. She is ruled out for myocardial injury here.  Claudean Kinds, MD 11/18/12 [phone]

## 2012-11-20 ENCOUNTER — Encounter (HOSPITAL_COMMUNITY): Payer: Medicare Other

## 2012-11-23 ENCOUNTER — Encounter (HOSPITAL_COMMUNITY): Payer: Medicare Other

## 2012-11-25 ENCOUNTER — Encounter (HOSPITAL_COMMUNITY): Payer: Medicare Other

## 2012-11-25 ENCOUNTER — Encounter (HOSPITAL_COMMUNITY)
Admission: RE | Admit: 2012-11-25 | Discharge: 2012-11-25 | Disposition: A | Discharge status: Home / Self Care | Payer: Self-pay | Source: Ambulatory Visit | Attending: Cardiology | Admitting: Cardiology

## 2012-11-27 ENCOUNTER — Encounter (HOSPITAL_COMMUNITY): Payer: Medicare Other

## 2012-11-30 ENCOUNTER — Encounter (HOSPITAL_COMMUNITY): Payer: Medicare Other

## 2012-12-01 ENCOUNTER — Ambulatory Visit: Payer: Medicare Other | Admitting: Pulmonary Disease

## 2012-12-02 ENCOUNTER — Encounter (HOSPITAL_COMMUNITY): Payer: Medicare Other

## 2012-12-02 DIAGNOSIS — I2510 Atherosclerotic heart disease of native coronary artery without angina pectoris: Secondary | ICD-10-CM | POA: Insufficient documentation

## 2012-12-02 DIAGNOSIS — Z9861 Coronary angioplasty status: Secondary | ICD-10-CM | POA: Insufficient documentation

## 2012-12-02 DIAGNOSIS — I4891 Unspecified atrial fibrillation: Secondary | ICD-10-CM | POA: Insufficient documentation

## 2012-12-02 DIAGNOSIS — Z5189 Encounter for other specified aftercare: Secondary | ICD-10-CM | POA: Insufficient documentation

## 2012-12-04 ENCOUNTER — Encounter (HOSPITAL_COMMUNITY): Payer: Medicare Other

## 2012-12-07 ENCOUNTER — Encounter (HOSPITAL_COMMUNITY): Payer: Medicare Other

## 2012-12-09 ENCOUNTER — Encounter (HOSPITAL_COMMUNITY): Payer: Medicare Other

## 2012-12-11 ENCOUNTER — Encounter (HOSPITAL_COMMUNITY): Payer: Medicare Other

## 2012-12-14 ENCOUNTER — Encounter (HOSPITAL_COMMUNITY): Payer: Medicare Other

## 2012-12-16 ENCOUNTER — Encounter (HOSPITAL_COMMUNITY): Payer: Medicare Other

## 2012-12-18 ENCOUNTER — Encounter (HOSPITAL_COMMUNITY): Payer: Medicare Other

## 2012-12-18 ENCOUNTER — Ambulatory Visit: Payer: Medicare Other | Admitting: Pulmonary Disease

## 2012-12-21 ENCOUNTER — Ambulatory Visit (INDEPENDENT_AMBULATORY_CARE_PROVIDER_SITE_OTHER): Payer: Medicare Other | Admitting: Pulmonary Disease

## 2012-12-21 ENCOUNTER — Encounter (HOSPITAL_COMMUNITY): Payer: Medicare Other

## 2012-12-21 ENCOUNTER — Encounter: Payer: Self-pay | Admitting: Pulmonary Disease

## 2012-12-21 VITALS — BP 118/88 | HR 63 | Temp 98.4°F | Ht 61.0 in | Wt 175.4 lb

## 2012-12-21 DIAGNOSIS — D126 Benign neoplasm of colon, unspecified: Secondary | ICD-10-CM

## 2012-12-21 DIAGNOSIS — I10 Essential (primary) hypertension: Secondary | ICD-10-CM

## 2012-12-21 DIAGNOSIS — Z23 Encounter for immunization: Secondary | ICD-10-CM

## 2012-12-21 DIAGNOSIS — E079 Disorder of thyroid, unspecified: Secondary | ICD-10-CM

## 2012-12-21 DIAGNOSIS — I2510 Atherosclerotic heart disease of native coronary artery without angina pectoris: Secondary | ICD-10-CM

## 2012-12-21 DIAGNOSIS — K5730 Diverticulosis of large intestine without perforation or abscess without bleeding: Secondary | ICD-10-CM

## 2012-12-21 DIAGNOSIS — E7800 Pure hypercholesterolemia, unspecified: Secondary | ICD-10-CM

## 2012-12-21 DIAGNOSIS — K449 Diaphragmatic hernia without obstruction or gangrene: Secondary | ICD-10-CM

## 2012-12-21 DIAGNOSIS — F411 Generalized anxiety disorder: Secondary | ICD-10-CM

## 2012-12-21 DIAGNOSIS — I4892 Unspecified atrial flutter: Secondary | ICD-10-CM

## 2012-12-21 DIAGNOSIS — M1990 Unspecified osteoarthritis, unspecified site: Secondary | ICD-10-CM

## 2012-12-21 DIAGNOSIS — M545 Low back pain: Secondary | ICD-10-CM

## 2012-12-21 DIAGNOSIS — E559 Vitamin D deficiency, unspecified: Secondary | ICD-10-CM

## 2012-12-21 MED ORDER — OMEPRAZOLE 20 MG PO CPDR: DELAYED_RELEASE_CAPSULE | ORAL | Status: DC

## 2012-12-21 NOTE — Progress Notes (Signed)
Subjective:    Patient ID: Virginia Casey, female    DOB: 01-Mar-1925, 79 y.o.   MRN: [id number]  HPI 80 y/o BF here for a follow up visit... she has mult med problems as noted below...   ~  April 23, 2011:  64mo ROV & Virginia Casey states that she is doing well, no new complaints or concerns today;  She continues to follow up w/ [facility] for Cards, and DrKumar for Endocrine;  [name] her Problem List, Meds, XRays, & Labs today... She is very proud of being nominated for Mother of the Year thru her church...    AB> doing well, no resp infections or exacerbations, on no regular meds...    HBP> on Amlod5 & Losartan100; BP= 122/82 & she denies CP, palpit, SOB, edema; rec to continue same meds + diet...    CAD> followed by Maia Breslow; we do not have his notes; pt states she is doing fine & denies CP/ angina/ etc; she loves the cardiac rehab program...    PAFlutter> still on a low dose Amio per [facility]- 200mg tabs- taking 1/2 MWF; we do ot have records & rely on her hx & med list; she denies palpit, dizzy, SOB, etc...    Cerebrovasc dis> on ASA81; she denies cerebral ischemic symptoms...    Chol> on Cres20; FLP shows TChol 118, TG 42, HDL 60, LDL 50- looks great continue same...    Thyroid dis> followed by Aaron Edelman; she has hx hyperthyroidism secondary to Amio & a prob multinod goiter; treated w/ Tapazole, then PTU but we do not have recent notes; she tells me that she is off the PTU & "everything is fine";     GI> HH, Divertics, Polyps> on PepcidBid    DJD> on Tylenol & VitD 1000u daily but Vit D level today is still low at 20 & she is asked to incr to 2000u/d...    Anxiety> on Alpraz0.25 prn which really helps...    Anemia> on Fe w/ VitC; Hg=13.1, MCV=78, Fe=66; rec to continue supplements... LABS 2/13:  FLP- great on Cres20;  Chems- ok w/ BUN=26 Creat=1.5;  CBC- wnl x MCV=78 Fe=66;  TSH=2.81;  VitD=20 & rec to incr supplement.  ~  October 22, 2011:  64mo ROV & Virginia Casey reports that she is stable- had some  chest discomfort and went to the ER 7/13> no evid of ischemia & she had f/u [facility] who did a stress test which was neg...     [name] her medical problems & current medications and updated them below >> CXR 7/13 showed normal heart size, largeHH, bibasilar atx, otherw clear... EKG 7/13 showed SBrady, rate52, no acute changes... Myoview 8/13 at [facility] showed no ischemia or infarction, normal wall motion, EF=70%... LABS 7/13:  Chems- wnl;  CBC- wnl...   ~  June 01, 2012:  15mo ROV & Virginia Casey reports a stable interval w/o new complaints or concerns... [name] the following medical problems during today's office visit >>     AB> doing well, no resp infections or exacerbations, on no regular meds...    HBP> on Losartan100 & off Amlod5; BP= 130/84 & she denies CP, palpit, SOB, edema; rec to continue same meds + diet...    CAD> followed by Maia Breslow; we do not have his notes; pt states she is doing fine & denies CP/ angina/ etc; she loves the cardiac rehab program...    PAFlutter> still on Amio per [facility]- 200mg tabs- taking one daily now; she is off the Eliquis; we  do not have records & rely on her hx & med list; she denies palpit, dizzy, SOB, etc...    Cerebrovasc dis> on ASA81; she denies cerebral ischemic symptoms...    Chol> on Cres20; FLP shows TChol 128, TG 32, HDL 68, LDL 53- looks great continue same...    Thyroid dis> followed by Aaron Edelman; she has hx hyperthyroidism secondary to Amio & a prob multinod goiter; treated w/ Tapazole, then PTU but we do not have recent notes; she tells me that she is off the PTU & "everything is fine";     GI> HH, Divertics, Polyps> on PepcidBid; she denies abd pain, dysphagia, n/v, c/d, blood seen...    DJD> on Tylenol & VitD 1000u daily but Vit D level today is still low at 20 & she is asked to incr to 2000u/d...    Anxiety> on Alpraz0.25 prn which really helps...    Anemia> on Fe w/ VitC; Hg=13.1, MCV=78, Fe=66; rec to continue supplements... [name]  prob list, meds, xrays and labs> see below for updates >>  LABS 3/14:  FLP- at goals on Cres20;  Chems- wnl;  CBC- wnl;  TSH=2.11;  VitD=12 & Rec to start 50,000u weekly supplement...   ~  September 22, 2012:  22mo ROV & add-on appt at pt request for SOB> Virginia Casey reports that [place] >100 daily[place] & ret yest noting incr SOB, can't get a DB, ?wheezy, didn't rest well & was anxious (this was her deceased husb's favorite group);  We discussed doing a thorough assessment>>  Exam is clear- no wheezing, rales, or rhonchi heard...  O2 sats were 98% on RA & did not desat w/ ambulation...  CXR 7/14 showed cardiomeg, largeHH, NAD...   PFT's were attempted but pt absolutely could not do the simple spirometry...  LABS 7/14 showed Chems- wnl, CBC- ok x plat=113K We decided to treat w/ a COMBIVENT Respimat to use 1 spray every 6h as needed for wheezing; she will relax & take her ALPRAZOLAM 0.25mg  tid & avoid the heat... [name] prob list, meds, xrays and labs> see below for updates >>   ~  December 21, 2012:  52mo ROV & Virginia Casey reports that she had a f/u w/ [facility] 7/14 & after extensive eval w/ abn Myoview, he did a cath w/ in-stent re-stenosis, dilated, & she is improved back to baseline;  She is now in Cardiac Rehab & loves it!!!  More recently she had epig pain & went to the ER- known largeHH & felt to have reflux; they changed her Pepcid to Omeprazole20Bid & I have asked her to change the Omep to Protonix40Bid in light of the recent in-stent restenosis & need for ASA81/ Plavix75...  [name] the following medical problems during today's office visit >>     AB> on Combivent prn; doing well, no resp infections or exacerbations...    HBP> on Losartan100 & Amlod5; BP= 118/88 & she denies CP, palpit, SOB, edema; rec to continue same meds + diet...    CAD> followed by Maia Breslow on ASA81, Plavix75; eval 7/14 w/ abn Myoview & subseq cath 8/14 showed in-stent  restenosis in LAD, s/p PCI & now back to baseline...    PAFlutter> still on Amio200 per [facility]; she denies palpit, dizzy, ch in SOB, etc...    Cerebrovasc dis> on ASA81/ Plavix75; she denies cerebral ischemic symptoms...    Chol> on Cres20; FLP 3/14 showed TChol 128, TG 32, HDL 68,  LDL 53- we don't have notes/ labs from [facility]...    Thyroid dis> followed by Aaron Edelman; she has hx hyperthyroidism secondary to Amio & a prob multinod goiter; treated w/ Tapazole, then PTU; she tells me that she is off the PTU & "everything is fine"; Labs 3/14 showed TSH= 2.11...    GI> HH, Divertics, Polyps> prev on PepcidBid; switched to Prilosec20bid after 9/14 ER visit but she is in need of effective Plavix anticoag effect, therefore switched to PANTOPRAZOLE 40mg  bid...    DJD> on Tylenol & VitD 50K per week since labs 3/14 showed VitD level lower at 12...    Anxiety> on Alpraz0.25 prn which really helps...    Anemia> on Fe w/ VitC; Hg=12.8, MCV=74, Fe= needs to be repeated; rec to continue supplements... [name] prob list, meds, xrays and labs> see below for updates >>           Problem List:  Hearing Loss >> she saw DrCrossley 3/13 w/ a high freq hearing loss & he removed creumen impactions' they are holding off on hearing aides for now...  ASTHMATIC BRONCHITIS (ICD-466.0) - breathing is stable now- s/p recent exacerbation... ~  CXR 1/12 showed basilar atx & large HH, cardiomeg, osteopenia, NAD... ~  [facility] 5/12 w/ refractory AB exac & improved w/ standard therapy & brief course of Pred; CXR- similar, NAD.[name]. ~  CXR 7/13 showed norm heart size, large HH, clear lungs, NAD.[name]. ~  7/14:  She presented w/ a mild exac brought on by the stress of a trip to [facility] & the heat etc=> Rx w/ COMBIVENT every 6h as needed... ~  CXR 7/14 showed cardiomeg, largeHH, NAD... We attempted PFTs but she was unable to perform... Os sats were 98% on RA...  HYPERTENSION (ICD-401.9) - now on LOSARTAN 100mg /d & AMLODIPINE 5mg /d  (and off her prev Ramipril due to asthma & Imdur due to HA) ...  ~  8/12:  BP= 110/72 & not checking BP's at home; she denies visual changes, CP, palipit, syncope, dyspnea, edema, etc... ~  2/13:  BP= 122/82 & she is stable, mostly asymptomatic w/o CP, palpit, SOB, etc... ~  8/13:  BP= 124/86 & she remains stable... ~  3/14: on Losartan100 & off Amlod5; BP= 130/84 & she denies CP, palpit, SOB, edema; rec to continue same meds + diet. ~  7/14: on Losartan100 & off Amlod5; BP= 132/92 & she denies CP, palpit, or edema... ~  10/14: now on Losartan100 + Amlodipine5; BP= 118/88 & she remains asymptomatic...  ATHEROSCLEROTIC HEART DISEASE (ICD-414.00) & PERIPHERAL VASCULAR DISEASE (ICD-443.9) - regularly on ASA 81mg /d, & off Plavix... followed by [facility] for Cards (we do not have his notes to review med changes)>> ~  hosp in Jan08 w/ syncope and MRI Brain showed mild atrophy & sm vessel dis;  MRA showed basilar art narrowing & mod stenosis in RICA cavernous segment... CDopplers however were WNL w/ antegrade vertebral flow, and no signif ICA stenoses detected... ~  hosp Feb10 by [facility] w/ non-STEMI- s/p PTCA and stent in the LAD. ~  hosp 7/11 by [facility] w/ CP, Abn Myoview & cath w/ 70% midLAD in-stent resteosis; s/p PTCA & started back on Plavix... ~  she is in Cardiac Rehab & really likes it... ~  7/13:  She went to the ER w/ chest discomfort; ruled out for ischemia (EKG= SBrday, rate52, wnl/NAD) & followed up w/ DrHarwani> he did MYOVIEW 8/13 which was neg for ischemia or infarction, norm wall motion & EF=70%... ~  7/14:  She continues to f/u w/ [facility] her cardiologist;  She will see him soon to f/u from this [place] trip for additional test => Myoview was Abn & Cath 8/14 w/ in-stent restenosis, s/p PCI... ~  10/14: now on ASA81 + Plavix75 and enrolled in cardiac rehab which she loves... (Note: Omep20Bid give via ER 9/14 is being changed to Protonix40Bid due to her Plavix Rx)...  ATRIAL FLUTTER,  PAROXYSMAL (ICD-427.32) - this occured 2/10 following her cath, PTCA, stent... he diagnosed flutter w/ 2:1 block... currently taking AMIODARONE 200mg /d...  HYPERCHOLESTEROLEMIA (ICD-272.0) - on CRESTOR 20mg /d... ~  FLP 11/08 showed TChol 161, TG 39, HDL 62, LDL 92... continue same med. ~  FLP 5/09 showed TChol 209, TG 70, HDL 74, LDL 124... rec- better diet, get weight down! ~  FLP in hosp 2/10 showed TChol 130, TG 69, HDL 45, LDL 71 ~  she reports that [facility] does fasting labs... ~  FLP 7/11 in hosp showed TChol 105, TG 30, HDL 66, LDL 33 ~  FLP 2/12 on Cres20 showed TChol 97, TG 38, HDL 50, LDL 39... copy to Cards ?decr to 10mg ? ~  FLP 2/13 on Cres20 showed TChol 118, TG 42, HDL 60, LDL 50... Continue same. ~  FLP 3/14 on Cres20 showed TChol 128, TG 32, HDL 68, LDL 53   THYROID DISEASE >> Hx Amiodarone induced hyperthyroidism diagnosed 2/12 & referred to [facility], treated w/ PTU (now off) & DrHarwani tapered the Amio... ~  1/13:  She had f/u DrKumar> everything improve4d w/ reduction in Amio dose; clinically euthyroid & no goiter palpated; TFT's in his office were WNL==> TSH=3.74,  FreeT3=2.4 (2.0-4.4), FreeT4=0.91 (0.61-1.12) ~  Labs 3/14 showed TSH= 2.11  HIATAL HERNIA (ICD-553.3) - prev on Nexium & Carafate Liq- but changed to PEPCID 20mg Bid by [facility] due to need for Plavix... she has a giant HH measuring 10cm per DrSam, w/ erosions in the pouch... last EGD by DrSam 4/08... hernia is seen on her CXRs. ~  9/14: she went to the Er w/ Epig pain & they felt it was reflux related due to her largeHH; they switched Pepcid to Omep20Bid but she will need to change this to PROTONIX40Bid due to the Plavix...  DIVERTICULOSIS OF COLON (ICD-562.10) & COLONIC POLYPS (ICD-211.3) ~  colonoscopy 11/06 showed divertics and 2- 4mm polyps... f/u planned in 4-36yrs... ~  [facility] 11/10 w/ lower GIB believed due to divertics... colonoscopy by DrJacobs showed severe divertics, no bleeding site, 7mm polyp  removed...  DEGENERATIVE JOINT DISEASE (ICD-715.90) & HIP PAIN, RIGHT (ICD-719.45) - currently taking OTC meds only but DrDean rec Vicodin for as needed use (she prefers Tylenol)... Eval by DrDean 3/09 w/ bone-on-bone right hip, tried injection & pain meds... right THR done 12/09...  VITAMIN D DEFICIENCY (ICD-268.9) ~  labs 5/09 showed Vit D level = 9... pt started on 50K weekly but she switched on her own to 1000u/d OTC. ~  labs 2/12 showed Vit D level = 18... asked to incr Vit D supplement to 2000 u daily (she never did). ~  Labs 2/13 showed Vit D level = 20... Asked to finally incr VitD supplement to 2000u daily... ~  Labs 3/14 showed Vit D = 12 & Rec to switch to 50K weekly Rx...  ANXIETY (ICD-300.00) - she is requesting a mild nerve pill & we have written ALPRAZOLAM 0.25mg  Prn use...  ANEMIA (ICD-285.9) - prev on Niferex 150mg /d, now FeSO4 325mg /d w/ Vit C 500mg ... she had an IDA from her  giant HH and erosions... ~  last blood count 11/08 showed Hg=13.7, Fe=56... ~  labs 5/09 showed Hg= 14.2.[name]. ~  labs 2/10 in hosp showed Hg= 13 ~  [facility] 11/10 w/ lower GIB, & Hg down to 8.6- tc 1u PC up to 9.3.Marland KitchenMarland Kitchen ~  labs 12/10 showed Hg= 10.6.[name]. rec> continue oral Fe w/ VitC... ~  [facility] labs 7/11 showed Hg= 11.6 - 10.5, MCV= 70... rec> restart Fe. ~  labs 2/12 showed Hg= 12.4, MCV= 77, Fe= 56 (sat=10%)... continue Fe w/ VitC. ~  Labs 5/12 in hosp showed Hg= 12.3, MCV= 68, & she continues on FeSO4 daily w/ VitC... ~  Labs 2/13 showed Hg=13.1, MCV=78, Fe=66; rec to continue supplements... 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   Past Surgical History  Procedure Laterality Date  . Total hip arthroplasty Right 12/09    Dr August Saucer  . Coronary angioplasty with stent placement  04/2008    "1; Dr Sharyn Lull" (10/06/2012)  . Coronary angioplasty  10/06/2012  . Dilation and  curettage of uterus      Outpatient Encounter Prescriptions as of 12/21/2012  Medication Sig Dispense Refill  . acetaminophen (TYLENOL) 325 MG tablet Take 325 mg by mouth every 6 (six) hours as needed. For pain      . ALPRAZolam (XANAX) 0.25 MG tablet Take 0.25 mg by mouth 3 (three) times daily as needed for anxiety.      [name] amiodarone (PACERONE) 200 MG tablet Take 200 mg by mouth daily.       [name] amLODipine (NORVASC) 5 MG tablet Take 5 mg by mouth daily.      [name] aspirin 81 MG tablet Take 81 mg by mouth daily.       . clopidogrel (PLAVIX) 75 MG tablet Take 1 tablet (75 mg total) by mouth daily with breakfast.  30 tablet  3  . ferrous gluconate (FERGON) 325 MG tablet Take 325 mg by mouth daily with breakfast. With vitamin C tablet       . Ipratropium-Albuterol (COMBIVENT RESPIMAT) 20-100 MCG/ACT AERS respimat Inhale 1 puff into the lungs every 6 (six) hours.  4 g  11  . losartan (COZAAR) 100 MG tablet take 1 tablet by mouth once daily  30 tablet  11  . nitroGLYCERIN (NITROSTAT) 0.4 MG SL tablet Place 0.4 mg under the tongue every 5 (five) minutes as needed. May repeat x3       . omeprazole (PRILOSEC) 20 MG capsule 1 capsule twice daily before breakfsat and dinner  60 capsule  3  . rosuvastatin (CRESTOR) 20 MG tablet Take 20 mg by mouth daily.       . Vitamin D, Ergocalciferol, (DRISDOL) 50000 UNITS CAPS Take 1 capsule (50,000 Units total) by mouth every 7 (seven) days.  4 capsule  11  . vitamin C (ASCORBIC ACID) 500 MG tablet Take 500 mg by mouth daily. With iron tablet       . [DISCONTINUED] Cholecalciferol (VITAMIN D) 1000 UNITS capsule Take 1,000 Units by mouth daily.        . [DISCONTINUED] famotidine (PEPCID) 20 MG tablet Take 1 tablet by mouth Twice daily.       No facility-administered encounter medications on file as of 12/21/2012.    No Known Allergies   Current Medications, Allergies, Past Medical History, Past Surgical History, Family  History, and Social History were reviewed in  [facility] record.   Review of Systems         See HPI - all other systems neg except as noted... The patient complains of dyspnea on exertion.  The patient denies anorexia, fever, weight loss, weight gain, vision loss, decreased hearing, hoarseness, chest pain, syncope, peripheral edema, prolonged cough, headaches, hemoptysis, abdominal pain, melena, hematochezia, severe indigestion/heartburn, hematuria, incontinence, muscle weakness, suspicious skin lesions, transient blindness, difficulty walking, depression, unusual weight change, abnormal bleeding, enlarged lymph nodes, and angioedema.     Objective:   Physical Exam     WD, WN, 79 y/o BF in NAD... GENERAL:  Alert & oriented; pleasant & cooperative... HEENT:  [place]/AT, EOM-wnl, EACs-clear, TMs-wnl, NOSE-clear, THROAT-clear & wnl. NECK:  Supple w/ fairROM; no JVD; normal carotid impulses w/o bruits; no thyromegaly or nodules palpated; no lymphadenopathy. CHEST:  Coarse BS w/ no wheezing, no rales, no signs of consolidation. HEART:  Regular Rhythm; gr 1/6 SEM without rubs or gallops detected... ABDOMEN:  Soft & nontender; normal bowel sounds; no organomegaly or masses palpated... EXT: without deformities, mod arthritic changes; s/p right THR; no varicose veins/ +venous insuffic/  tr edema . BACK:  sl tender along right PSIS area... NEURO:  CN's intact; no focal neuro defict... DERM:  no lesions seen...  RADIOLOGY DATA:  Reviewed in the EPIC EMR & discussed w/ the patient...  LABORATORY DATA:  Reviewed in the EPIC EMR & discussed w/ the patient...     Assessment & Plan:    AB>  Improved overall & she had stopped all meds on her own=> using the COMBIVENT respimat prn...  HBP>  Fair control on the Losartan & Amlodipine; asked to monitor BP at home...  ASHD>  Followed by Maia Breslow for Cards> cath 8/14 w/ LAD instent restenosis, s/p successful PCI & she is back to baseline; Note: ER 9/14 switched her Pepcid to  Omep20Bid but her Plavix is critically important to help patency of the LAD stent; therefore we will switch the Omep to PANTOPRAZOLE40Bid...  AFlutter>  DrHarwani has her on Amio 200mg  daily; her prev thyroid problems have resolved...  CHOL>  On Cres20 w/ good control of FLP...  THYROID DISEASE>  Prev Amio induced hyperthyroidism managed by DrKumar (on PTU prev) & resolved on lower dose of Amio...  GI>  Hx HH/ GERD/ Divertics/ Polyps>  She is followed by DrJacobs for GI, we will switch her PPI to PROTONIX40Bid due to her needed Plavix rx...  DJD>  She is followed by DrDean for Ortho...  Anemia>  Hg is OK but MCV is low, Fe=66 & she will continue her Fe & VitC supplements...  Anxiety>  Requesting a mild nerve pill & we wrote for Alprazolam...   Patient's Medications  New Prescriptions   No medications on file  Previous Medications   ACETAMINOPHEN (TYLENOL) 325 MG TABLET    Take 325 mg by mouth every 6 (six) hours as needed. For pain   ALPRAZOLAM (XANAX) 0.25 MG TABLET    Take 0.25 mg by mouth 3 (three) times daily as needed for anxiety.   AMIODARONE (PACERONE) 200 MG TABLET    Take 200 mg by mouth daily.    AMLODIPINE (NORVASC) 5 MG TABLET    Take 5 mg by mouth daily.   ASPIRIN 81 MG TABLET    Take 81 mg by mouth daily.    CLOPIDOGREL (PLAVIX) 75 MG TABLET    Take 1 tablet (75 mg total)  by mouth daily with breakfast.   FERROUS GLUCONATE (FERGON) 325 MG TABLET    Take 325 mg by mouth daily with breakfast. With vitamin C tablet    IPRATROPIUM-ALBUTEROL (COMBIVENT RESPIMAT) 20-100 MCG/ACT AERS RESPIMAT    Inhale 1 puff into the lungs every 6 (six) hours.   LOSARTAN (COZAAR) 100 MG TABLET    take 1 tablet by mouth once daily   NITROGLYCERIN (NITROSTAT) 0.4 MG SL TABLET    Place 0.4 mg under the tongue every 5 (five) minutes as needed. May repeat x3    ROSUVASTATIN (CRESTOR) 20 MG TABLET    Take 20 mg by mouth daily.    VITAMIN C (ASCORBIC ACID) 500 MG TABLET    Take 500 mg by mouth daily.  With iron tablet    VITAMIN D, ERGOCALCIFEROL, (DRISDOL) 50000 UNITS CAPS    Take 1 capsule (50,000 Units total) by mouth every 7 (seven) days.  Modified Medications   Modified Medication Previous Medication   OMEPRAZOLE (PRILOSEC) 20 MG CAPSULE omeprazole (PRILOSEC) 20 MG capsule      1 capsule twice daily before breakfsat and dinner    1 capsule twice daily before breakfsat and dinner  Discontinued Medications   CHOLECALCIFEROL (VITAMIN D) 1000 UNITS CAPSULE    Take 1,000 Units by mouth daily.     FAMOTIDINE (PEPCID) 20 MG TABLET    Take 1 tablet by mouth Twice daily.

## 2012-12-21 NOTE — Patient Instructions (Signed)
Today we updated your med list in our EPIC system...    Continue your current medications the same...  We reflled your PRILOSEC 20mg  - take on tab twice daily about 30 min before breakfast & dinner...  Keep up the great work at Cardiac rehab... (I'll be sure Kathlene November takes good care of you)...  Call for any questions...  Let's plan a follow up visit in January as planned.Marland KitchenMarland Kitchen

## 2012-12-22 MED ORDER — PANTOPRAZOLE SODIUM 40 MG PO TBEC: 40.0000 mg | DELAYED_RELEASE_TABLET | Freq: Two times a day (BID) | ORAL | Status: DC

## 2012-12-22 NOTE — Addendum Note (Signed)
Addended by: Marcellus Scott on: 12/22/2012 08:52 AM   Modules accepted: Orders

## 2012-12-23 ENCOUNTER — Encounter (HOSPITAL_COMMUNITY)
Admission: RE | Admit: 2012-12-23 | Discharge: 2012-12-23 | Disposition: A | Discharge status: Home / Self Care | Payer: Self-pay | Source: Ambulatory Visit | Attending: Cardiology | Admitting: Cardiology

## 2012-12-23 ENCOUNTER — Encounter (HOSPITAL_COMMUNITY): Payer: Medicare Other

## 2012-12-25 ENCOUNTER — Ambulatory Visit: Payer: Medicare Other | Admitting: Pulmonary Disease

## 2012-12-25 ENCOUNTER — Encounter (HOSPITAL_COMMUNITY): Payer: Medicare Other

## 2012-12-28 ENCOUNTER — Encounter (HOSPITAL_COMMUNITY): Payer: Medicare Other

## 2012-12-30 ENCOUNTER — Encounter (HOSPITAL_COMMUNITY)
Admission: RE | Admit: 2012-12-30 | Discharge: 2012-12-30 | Disposition: A | Discharge status: Home / Self Care | Payer: Self-pay | Source: Ambulatory Visit | Attending: Cardiology | Admitting: Cardiology

## 2012-12-30 ENCOUNTER — Encounter (HOSPITAL_COMMUNITY): Payer: Medicare Other

## 2013-01-01 ENCOUNTER — Encounter (HOSPITAL_COMMUNITY): Payer: Medicare Other

## 2013-01-04 ENCOUNTER — Encounter (HOSPITAL_COMMUNITY): Payer: Medicare Other

## 2013-01-04 DIAGNOSIS — I4891 Unspecified atrial fibrillation: Secondary | ICD-10-CM | POA: Insufficient documentation

## 2013-01-04 DIAGNOSIS — Z5189 Encounter for other specified aftercare: Secondary | ICD-10-CM | POA: Insufficient documentation

## 2013-01-04 DIAGNOSIS — I2510 Atherosclerotic heart disease of native coronary artery without angina pectoris: Secondary | ICD-10-CM | POA: Insufficient documentation

## 2013-01-04 DIAGNOSIS — Z9861 Coronary angioplasty status: Secondary | ICD-10-CM | POA: Insufficient documentation

## 2013-01-06 ENCOUNTER — Encounter (HOSPITAL_COMMUNITY): Payer: Medicare Other

## 2013-01-08 ENCOUNTER — Encounter (HOSPITAL_COMMUNITY)
Admission: RE | Admit: 2013-01-08 | Discharge: 2013-01-08 | Disposition: A | Discharge status: Home / Self Care | Payer: Self-pay | Source: Ambulatory Visit | Attending: Cardiology | Admitting: Cardiology

## 2013-01-08 ENCOUNTER — Encounter (HOSPITAL_COMMUNITY): Payer: Medicare Other

## 2013-01-11 ENCOUNTER — Encounter (HOSPITAL_COMMUNITY): Payer: Medicare Other

## 2013-01-13 ENCOUNTER — Encounter (HOSPITAL_COMMUNITY): Payer: Medicare Other

## 2013-01-15 ENCOUNTER — Encounter (HOSPITAL_COMMUNITY): Payer: Medicare Other

## 2013-01-18 ENCOUNTER — Encounter (HOSPITAL_COMMUNITY): Payer: Medicare Other

## 2013-01-18 ENCOUNTER — Encounter (HOSPITAL_COMMUNITY)
Admission: RE | Admit: 2013-01-18 | Discharge: 2013-01-18 | Disposition: A | Discharge status: Home / Self Care | Payer: Self-pay | Source: Ambulatory Visit | Attending: Cardiology | Admitting: Cardiology

## 2013-01-20 ENCOUNTER — Encounter (HOSPITAL_COMMUNITY): Payer: Medicare Other

## 2013-01-20 ENCOUNTER — Encounter (HOSPITAL_COMMUNITY)
Admission: RE | Admit: 2013-01-20 | Discharge: 2013-01-20 | Disposition: A | Discharge status: Home / Self Care | Payer: Self-pay | Source: Ambulatory Visit | Attending: Cardiology | Admitting: Cardiology

## 2013-01-22 ENCOUNTER — Encounter (HOSPITAL_COMMUNITY): Payer: Medicare Other

## 2013-01-25 ENCOUNTER — Encounter (HOSPITAL_COMMUNITY): Payer: Medicare Other

## 2013-01-27 ENCOUNTER — Encounter (HOSPITAL_COMMUNITY): Payer: Medicare Other

## 2013-02-01 ENCOUNTER — Encounter (HOSPITAL_COMMUNITY)
Admission: RE | Admit: 2013-02-01 | Discharge: 2013-02-01 | Disposition: A | Discharge status: Home / Self Care | Payer: Self-pay | Source: Ambulatory Visit | Attending: Cardiology | Admitting: Cardiology

## 2013-02-01 ENCOUNTER — Encounter (HOSPITAL_COMMUNITY): Payer: Medicare Other

## 2013-02-01 DIAGNOSIS — Z5189 Encounter for other specified aftercare: Secondary | ICD-10-CM | POA: Insufficient documentation

## 2013-02-01 DIAGNOSIS — I2510 Atherosclerotic heart disease of native coronary artery without angina pectoris: Secondary | ICD-10-CM | POA: Insufficient documentation

## 2013-02-01 DIAGNOSIS — Z9861 Coronary angioplasty status: Secondary | ICD-10-CM | POA: Insufficient documentation

## 2013-02-01 DIAGNOSIS — I4891 Unspecified atrial fibrillation: Secondary | ICD-10-CM | POA: Insufficient documentation

## 2013-02-03 ENCOUNTER — Encounter (HOSPITAL_COMMUNITY): Payer: Medicare Other

## 2013-02-05 ENCOUNTER — Encounter (HOSPITAL_COMMUNITY): Payer: Medicare Other

## 2013-02-08 ENCOUNTER — Encounter (HOSPITAL_COMMUNITY): Payer: Medicare Other

## 2013-02-10 ENCOUNTER — Encounter (HOSPITAL_COMMUNITY): Payer: Medicare Other

## 2013-02-12 ENCOUNTER — Encounter (HOSPITAL_COMMUNITY)
Admission: RE | Admit: 2013-02-12 | Discharge: 2013-02-12 | Disposition: A | Discharge status: Home / Self Care | Payer: Self-pay | Source: Ambulatory Visit | Attending: Cardiology | Admitting: Cardiology

## 2013-02-12 ENCOUNTER — Encounter (HOSPITAL_COMMUNITY): Payer: Medicare Other

## 2013-02-15 ENCOUNTER — Encounter (HOSPITAL_COMMUNITY): Payer: Medicare Other

## 2013-02-17 ENCOUNTER — Encounter (HOSPITAL_COMMUNITY)
Admission: RE | Admit: 2013-02-17 | Discharge: 2013-02-17 | Disposition: A | Discharge status: Home / Self Care | Payer: Self-pay | Source: Ambulatory Visit | Attending: Cardiology | Admitting: Cardiology

## 2013-02-17 ENCOUNTER — Encounter (HOSPITAL_COMMUNITY): Payer: Medicare Other

## 2013-02-19 ENCOUNTER — Encounter (HOSPITAL_COMMUNITY): Payer: Medicare Other

## 2013-02-22 ENCOUNTER — Encounter (HOSPITAL_COMMUNITY)
Admission: RE | Admit: 2013-02-22 | Discharge: 2013-02-22 | Disposition: A | Discharge status: Home / Self Care | Payer: Self-pay | Source: Ambulatory Visit | Attending: Cardiology | Admitting: Cardiology

## 2013-02-22 ENCOUNTER — Encounter (HOSPITAL_COMMUNITY): Payer: Medicare Other

## 2013-02-24 ENCOUNTER — Encounter (HOSPITAL_COMMUNITY): Payer: Medicare Other

## 2013-03-01 ENCOUNTER — Encounter (HOSPITAL_COMMUNITY): Payer: Medicare Other

## 2013-03-01 ENCOUNTER — Encounter (HOSPITAL_COMMUNITY)
Admission: RE | Admit: 2013-03-01 | Discharge: 2013-03-01 | Disposition: A | Discharge status: Home / Self Care | Payer: Self-pay | Source: Ambulatory Visit | Attending: Cardiology | Admitting: Cardiology

## 2013-03-03 ENCOUNTER — Encounter (HOSPITAL_COMMUNITY): Payer: Medicare Other

## 2013-03-05 ENCOUNTER — Encounter (HOSPITAL_COMMUNITY): Payer: Medicare Other

## 2013-03-05 DIAGNOSIS — I4891 Unspecified atrial fibrillation: Secondary | ICD-10-CM | POA: Insufficient documentation

## 2013-03-05 DIAGNOSIS — I2510 Atherosclerotic heart disease of native coronary artery without angina pectoris: Secondary | ICD-10-CM | POA: Insufficient documentation

## 2013-03-05 DIAGNOSIS — Z9861 Coronary angioplasty status: Secondary | ICD-10-CM | POA: Insufficient documentation

## 2013-03-05 DIAGNOSIS — Z5189 Encounter for other specified aftercare: Secondary | ICD-10-CM | POA: Insufficient documentation

## 2013-03-08 ENCOUNTER — Encounter (HOSPITAL_COMMUNITY): Payer: Medicare Other

## 2013-03-10 ENCOUNTER — Encounter (HOSPITAL_COMMUNITY): Payer: Medicare Other

## 2013-03-12 ENCOUNTER — Encounter (HOSPITAL_COMMUNITY): Payer: Medicare Other

## 2013-03-15 ENCOUNTER — Encounter (HOSPITAL_COMMUNITY): Payer: Medicare Other

## 2013-03-17 ENCOUNTER — Encounter (HOSPITAL_COMMUNITY): Payer: Medicare Other

## 2013-03-17 ENCOUNTER — Encounter (HOSPITAL_COMMUNITY)
Admission: RE | Admit: 2013-03-17 | Discharge: 2013-03-17 | Disposition: A | Discharge status: Home / Self Care | Payer: Self-pay | Source: Ambulatory Visit | Attending: Cardiology | Admitting: Cardiology

## 2013-03-19 ENCOUNTER — Encounter (HOSPITAL_COMMUNITY): Payer: Medicare Other

## 2013-03-22 ENCOUNTER — Encounter (HOSPITAL_COMMUNITY): Payer: Medicare Other

## 2013-03-22 ENCOUNTER — Encounter (HOSPITAL_COMMUNITY): Admission: RE | Admit: 2013-03-22 | Payer: Medicare Other | Source: Ambulatory Visit

## 2013-03-23 ENCOUNTER — Other Ambulatory Visit (INDEPENDENT_AMBULATORY_CARE_PROVIDER_SITE_OTHER): Payer: Medicare Other

## 2013-03-23 ENCOUNTER — Encounter: Payer: Self-pay | Admitting: Pulmonary Disease

## 2013-03-23 ENCOUNTER — Ambulatory Visit (INDEPENDENT_AMBULATORY_CARE_PROVIDER_SITE_OTHER): Payer: Medicare Other | Admitting: Pulmonary Disease

## 2013-03-23 VITALS — BP 132/82 | HR 71 | Temp 97.2°F | Ht 61.0 in | Wt 181.0 lb

## 2013-03-23 DIAGNOSIS — E559 Vitamin D deficiency, unspecified: Secondary | ICD-10-CM

## 2013-03-23 DIAGNOSIS — I10 Essential (primary) hypertension: Secondary | ICD-10-CM

## 2013-03-23 DIAGNOSIS — D126 Benign neoplasm of colon, unspecified: Secondary | ICD-10-CM

## 2013-03-23 DIAGNOSIS — I2510 Atherosclerotic heart disease of native coronary artery without angina pectoris: Secondary | ICD-10-CM

## 2013-03-23 DIAGNOSIS — E7800 Pure hypercholesterolemia, unspecified: Secondary | ICD-10-CM

## 2013-03-23 DIAGNOSIS — I4892 Unspecified atrial flutter: Secondary | ICD-10-CM

## 2013-03-23 DIAGNOSIS — E079 Disorder of thyroid, unspecified: Secondary | ICD-10-CM

## 2013-03-23 DIAGNOSIS — F411 Generalized anxiety disorder: Secondary | ICD-10-CM

## 2013-03-23 DIAGNOSIS — K5730 Diverticulosis of large intestine without perforation or abscess without bleeding: Secondary | ICD-10-CM

## 2013-03-23 DIAGNOSIS — M545 Low back pain, unspecified: Secondary | ICD-10-CM

## 2013-03-23 DIAGNOSIS — K449 Diaphragmatic hernia without obstruction or gangrene: Secondary | ICD-10-CM

## 2013-03-23 DIAGNOSIS — M1990 Unspecified osteoarthritis, unspecified site: Secondary | ICD-10-CM

## 2013-03-23 DIAGNOSIS — I739 Peripheral vascular disease, unspecified: Secondary | ICD-10-CM

## 2013-03-23 LAB — HEPATIC FUNCTION PANEL
ALT: 21 U/L (ref 0–35)
AST: 25 U/L (ref 0–37)
Albumin: 3.9 g/dL (ref 3.5–5.2)
Alkaline Phosphatase: 81 U/L (ref 39–117)
BILIRUBIN TOTAL: 1 mg/dL (ref 0.3–1.2)
Bilirubin, Direct: 0.2 mg/dL (ref 0.0–0.3)
TOTAL PROTEIN: 8 g/dL (ref 6.0–8.3)

## 2013-03-23 LAB — BASIC METABOLIC PANEL
BUN: 42 mg/dL — AB (ref 6–23)
CHLORIDE: 107 meq/L (ref 96–112)
CO2: 26 meq/L (ref 19–32)
CREATININE: 1.8 mg/dL — AB (ref 0.4–1.2)
Calcium: 9.3 mg/dL (ref 8.4–10.5)
GFR: 33.44 mL/min — ABNORMAL LOW (ref >60.00)
Glucose, Bld: 88 mg/dL (ref 70–99)
Potassium: 4.8 mEq/L (ref 3.5–5.1)
Sodium: 141 mEq/L (ref 135–145)

## 2013-03-23 LAB — CBC WITH DIFFERENTIAL/PLATELET
BASOS PCT: 0.4 % (ref 0.0–3.0)
Basophils Absolute: 0 10*3/uL (ref 0.0–0.1)
EOS PCT: 1.1 % (ref 0.0–5.0)
Eosinophils Absolute: 0.1 10*3/uL (ref 0.0–0.7)
HCT: 38 % (ref 36.0–46.0)
Hemoglobin: 12.3 g/dL (ref 12.0–15.0)
LYMPHS PCT: 37.2 % (ref 12.0–46.0)
Lymphs Abs: 2.3 10*3/uL (ref 0.7–4.0)
MCHC: 32.3 g/dL (ref 30.0–36.0)
MCV: 77.8 fl — ABNORMAL LOW (ref 78.0–100.0)
MONOS PCT: 7.6 % (ref 3.0–12.0)
Monocytes Absolute: 0.5 10*3/uL (ref 0.1–1.0)
NEUTROS PCT: 53.7 % (ref 43.0–77.0)
Neutro Abs: 3.4 10*3/uL (ref 1.4–7.7)
Platelets: 112 10*3/uL — ABNORMAL LOW (ref 150.0–400.0)
RBC: 4.89 Mil/uL (ref 3.87–5.11)
RDW: 15.4 % — ABNORMAL HIGH (ref 11.5–14.6)
WBC: 6.3 10*3/uL (ref 4.5–10.5)

## 2013-03-23 LAB — LIPID PANEL
CHOLESTEROL: 133 mg/dL (ref 0–200)
HDL: 75.1 mg/dL (ref >39.00)
LDL CALC: 50 mg/dL (ref 0–99)
TRIGLYCERIDES: 38 mg/dL (ref 0.0–149.0)
Total CHOL/HDL Ratio: 2
VLDL: 7.6 mg/dL (ref 0.0–40.0)

## 2013-03-23 LAB — TSH: TSH: 3.09 u[IU]/mL (ref 0.35–5.50)

## 2013-03-23 NOTE — Patient Instructions (Signed)
Today we updated your med list in our EPIC system...    Continue your current medications the same...  Today we did your follow up FASTING blood work...    We will contact you w/ the results when available...     We will send a copy to drHarwani for his records as well...  Call for any questions...  Let's plan a follow up visit in 2mo, sooner if needed for problems.Marland KitchenMarland Kitchen

## 2013-03-23 NOTE — Progress Notes (Signed)
Subjective:    Patient ID: Virginia Casey, female    DOB: [birth year], 80 y.o.   MRN: [id number]  HPI 80 y/o BF here for a follow up visit... she has mult med problems as noted below...   ~  October 22, 2011:  22mo ROV & Virginia Casey reports that she is stable- had some chest discomfort and went to the ER 7/13> no evid of ischemia & she had f/u [facility] who did a stress test which was neg...     [name] her medical problems & current medications and updated them below >>  CXR 7/13 showed normal heart size, largeHH, bibasilar atx, otherw clear...  EKG 7/13 showed SBrady, rate52, no acute changes...  Myoview 8/13 at [facility] showed no ischemia or infarction, normal wall motion, EF=70%...  LABS 7/13:  Chems- wnl;  CBC- wnl...   ~  June 01, 2012:  51mo ROV & Virginia Casey reports a stable interval w/o new complaints or concerns... [name] the following medical problems during today's office visit >>     AB> doing well, no resp infections or exacerbations, on no regular meds...    HBP> on Losartan100 & off Amlod5; BP= 130/84 & she denies CP, palpit, SOB, edema; rec to continue same meds + diet...    CAD> followed by Maia Breslow; we do not have his notes; pt states she is doing fine & denies CP/ angina/ etc; she loves the cardiac rehab program...    PAFlutter> still on Amio per [facility]- 200mg tabs- taking one daily now; she is off the Eliquis; we do not have records & rely on her hx & med list; she denies palpit, dizzy, SOB, etc...    Cerebrovasc dis> on ASA81; she denies cerebral ischemic symptoms...    Chol> on Cres20; FLP shows TChol 128, TG 32, HDL 68, LDL 53- looks great continue same...    Thyroid dis> followed by Aaron Edelman; she has hx hyperthyroidism secondary to Amio & a prob multinod goiter; treated w/ Tapazole, then PTU but we do not have recent notes; she tells me that she is off the PTU & "everything is fine";     GI> HH, Divertics, Polyps> on PepcidBid; she denies abd pain, dysphagia, n/v, c/d, blood  seen...    DJD> on Tylenol & VitD 1000u daily but Vit D level today is still low at 20 & she is asked to incr to 2000u/d...    Anxiety> on Alpraz0.25 prn which really helps...    Anemia> on Fe w/ VitC; Hg=13.1, MCV=78, Fe=66; rec to continue supplements... [name] prob list, meds, xrays and labs> see below for updates >>   LABS 3/14:  FLP- at goals on Cres20;  Chems- wnl;  CBC- wnl;  TSH=2.11;  VitD=12 & Rec to start 50,000u weekly supplement...   ~  September 22, 2012:  12mo ROV & add-on appt at pt request for SOB> Virginia Casey reports that [place]very hot >100 daily) & ret yest noting incr SOB, can't get a DB, ?wheezy, didn't rest well & was anxious (this was her deceased husb's favorite group);  We discussed doing a thorough assessment>>  Exam is clear- no wheezing, rales, or rhonchi heard...  O2 sats were 98% on RA & did not desat w/ ambulation...  CXR 7/14 showed cardiomeg, largeHH, NAD...   PFT's were attempted but pt absolutely could not do the simple spirometry...  LABS 7/14 showed Chems- wnl, CBC- ok x plat=113K We decided to treat  w/ a COMBIVENT Respimat to use 1 spray every 6h as needed for wheezing; she will relax & take her ALPRAZOLAM 0.25mg  tid & avoid the heat... [name] prob list, meds, xrays and labs> see below for updates >>   ~  December 21, 2012:  39mo ROV & Virginia Casey reports that she had a f/u w/ [facility] 7/14 & after extensive eval w/ abn Myoview, he did a cath w/ in-stent re-stenosis, dilated, & she is improved back to baseline;  She is now in Cardiac Rehab & loves it!!!  More recently she had epig pain & went to the ER- known largeHH & felt to have reflux; they changed her Pepcid to Omeprazole20Bid & I have asked her to change the Omep to Protonix40Bid in light of the recent in-stent restenosis & need for ASA81/ Plavix75...  [name] the following medical problems during today's office visit >>     AB> on Combivent prn; doing well, no  resp infections or exacerbations...    HBP> on Losartan100 & Amlod5; BP= 118/88 & she denies CP, palpit, SOB, edema; rec to continue same meds + diet...    CAD> followed by Maia Breslow on ASA81, Plavix75; eval 7/14 w/ abn Myoview & subseq cath 8/14 showed in-stent restenosis in LAD, s/p PCI & now back to baseline...    PAFlutter> still on Amio200 per [facility]; she denies palpit, dizzy, ch in SOB, etc...    Cerebrovasc dis> on ASA81/ Plavix75; she denies cerebral ischemic symptoms...    Chol> on Cres20; FLP 3/14 showed TChol 128, TG 32, HDL 68, LDL 53- we don't have notes/ labs from [facility]....    Thyroid dis> followed by Aaron Edelman; she has hx hyperthyroidism secondary to Amio & a prob multinod goiter; treated w/ Tapazole, then PTU; she tells me that she is off the PTU & "everything is fine"; Labs 3/14 showed TSH= 2.11...    GI> HH, Divertics, Polyps> prev on PepcidBid; switched to Prilosec20bid after 9/14 ER visit but she is in need of effective Plavix anticoag effect, therefore switched to PANTOPRAZOLE 40mg  bid...    DJD> on Tylenol & VitD 50K per week since labs 3/14 showed VitD level lower at 12...    Anxiety> on Alpraz0.25 prn which really helps...    Anemia> on Fe w/ VitC; Hg=12.8, MCV=74, Fe= needs to be repeated; rec to continue supplements... [name] prob list, meds, xrays and labs> see below for updates >>   ~  March 23, 2013:  39mo ROV & Virginia Casey reports doing well, no new complaints or concerns...    Chol> on Cres20; FLP 1/15 showed TChol 133, TG 38, HDL 75, LDL 50- we don't have notes/ labs from [facility]....    Renal insuffic> on Amlod5, Losar100, Amio200; Labs 1/15 showed BUN=42, Cr=1.8.Marland KitchenMarland Kitchen She knows to avoid NSAIDs & incr fluids...    Anemia> Hg=12.3, MCV=78; needs to restart Fe daily & stay on this... [name] prob list, meds, xrays and labs> see below for updates >>   LABS 1/15:  FLP- at goals on Cres20;  Chems- ok x renal insuffic w/ BUN=42, Cr=1.8;  CBC- ok w/ Hg=12.3, MCV=78;   TSH=3.09;  VitD=50... REC to continue Cres20, avoid NSAIDs & incr fluids, start FeSO4 daily and continue VitD50K weekly...           Problem List:  Hearing Loss >> she saw DrCrossley 3/13 w/ a high freq hearing loss & he removed creumen impactions' they are holding off on hearing aides for now...  ASTHMATIC BRONCHITIS (ICD-466.0) -  breathing is stable now- s/p recent exacerbation... ~  CXR 1/12 showed basilar atx & large HH, cardiomeg, osteopenia, NAD... ~  [facility] 5/12 w/ refractory AB exac & improved w/ standard therapy & brief course of Pred; CXR- similar, NAD.[name]. ~  CXR 7/13 showed norm heart size, large HH, clear lungs, NAD.[name]. ~  7/14:  She presented w/ a mild exac brought on by the stress of a trip to [facility] & the heat etc=> Rx w/ COMBIVENT every 6h as needed... ~  CXR 7/14 showed cardiomeg, largeHH, NAD... We attempted PFTs but she was unable to perform... Os sats were 98% on RA...  HYPERTENSION (ICD-401.9) - now on LOSARTAN 100mg /d & AMLODIPINE 5mg /d (and off her prev Ramipril due to asthma & Imdur due to HA) ...  ~  8/12:  BP= 110/72 & not checking BP's at home; she denies visual changes, CP, palipit, syncope, dyspnea, edema, etc... ~  2/13:  BP= 122/82 & she is stable, mostly asymptomatic w/o CP, palpit, SOB, etc... ~  8/13:  BP= 124/86 & she remains stable... ~  3/14: on Losartan100 & off Amlod5; BP= 130/84 & she denies CP, palpit, SOB, edema; rec to continue same meds + diet. ~  7/14: on Losartan100 & off Amlod5; BP= 132/92 & she denies CP, palpit, or edema... ~  10/14: now on Losartan100 + Amlodipine5; BP= 118/88 & she remains asymptomatic...  ATHEROSCLEROTIC HEART DISEASE (ICD-414.00) & PERIPHERAL VASCULAR DISEASE (ICD-443.9) - regularly on ASA 81mg /d, & off Plavix... followed by [facility] for Cards (we do not have his notes to review med changes)>> ~  hosp in Jan08 w/ syncope and MRI Brain showed mild atrophy & sm vessel dis;  MRA showed basilar art narrowing & mod stenosis in  RICA cavernous segment... CDopplers however were WNL w/ antegrade vertebral flow, and no signif ICA stenoses detected... ~  hosp Feb10 by [facility] w/ non-STEMI- s/p PTCA and stent in the LAD. ~  hosp 7/11 by [facility] w/ CP, Abn Myoview & cath w/ 70% midLAD in-stent resteosis; s/p PTCA & started back on Plavix... ~  she is in Cardiac Rehab & really likes it... ~  7/13:  She went to the ER w/ chest discomfort; ruled out for ischemia (EKG= SBrday, rate52, wnl/NAD) & followed up w/ DrHarwani> he did MYOVIEW 8/13 which was neg for ischemia or infarction, norm wall motion & EF=70%... ~  7/14:  She continues to f/u w/ [facility] her cardiologist;  She will see him soon to f/u from this [place] trip for additional test => Myoview was Abn & Cath 8/14 w/ in-stent restenosis, s/p PCI... ~  10/14: now on ASA81 + Plavix75 and enrolled in cardiac rehab which she loves... (Note: Omep20Bid give via ER 9/14 is being changed to Protonix40Bid due to her Plavix Rx)...  ATRIAL FLUTTER, PAROXYSMAL (ICD-427.32) - this occured 2/10 following her cath, PTCA, stent... he diagnosed flutter w/ 2:1 block... currently taking AMIODARONE 200mg /d...  HYPERCHOLESTEROLEMIA (ICD-272.0) - on CRESTOR 20mg /d... ~  FLP 11/08 showed TChol 161, TG 39, HDL 62, LDL 92... continue same med. ~  FLP 5/09 showed TChol 209, TG 70, HDL 74, LDL 124... rec- better diet, get weight down! ~  FLP in hosp 2/10 showed TChol 130, TG 69, HDL 45, LDL 71 ~  she reports that [facility] does fasting labs... ~  FLP 7/11 in hosp showed TChol 105, TG 30, HDL 66, LDL 33 ~  FLP 2/12 on Cres20 showed TChol 97, TG 38, HDL 50, LDL 39... copy to  Cards ?decr to 10mg ? ~  FLP 2/13 on Cres20 showed TChol 118, TG 42, HDL 60, LDL 50... Continue same. ~  FLP 3/14 on Cres20 showed TChol 128, TG 32, HDL 68, LDL 53  ~  FLP 1/15 on Cres20 showed TChol 133, TG 38, HDL 75, LDL 50  THYROID DISEASE >> Hx Amiodarone induced hyperthyroidism diagnosed 2/12 & referred to [facility],  treated w/ PTU (now off) & DrHarwani tapered the Amio... ~  1/13:  She had f/u DrKumar> everything improve4d w/ reduction in Amio dose; clinically euthyroid & no goiter palpated; TFT's in his office were WNL==> TSH=3.74,  FreeT3=2.4 (2.0-4.4), FreeT4=0.91 (0.61-1.12) ~  Labs 3/14 showed TSH= 2.11 ~  Labs 1/15 showed TSH= 3.09  HIATAL HERNIA (ICD-553.3) - prev on Nexium & Carafate Liq- but changed to PEPCID 20mg Bid by [facility] due to need for Plavix... she has a giant HH measuring 10cm per DrSam, w/ erosions in the pouch... last EGD by DrSam 4/08... hernia is seen on her CXRs. ~  9/14: she went to the Er w/ Epig pain & they felt it was reflux related due to her largeHH; they switched Pepcid to Omep20Bid but she will need to change this to PROTONIX40Bid due to the Plavix...  DIVERTICULOSIS OF COLON (ICD-562.10) & COLONIC POLYPS (ICD-211.3) ~  colonoscopy 11/06 showed divertics and 2- 4mm polyps... f/u planned in 4-80yrs... ~  [facility] 11/10 w/ lower GIB believed due to divertics... colonoscopy by DrJacobs showed severe divertics, no bleeding site, 7mm polyp removed...  RENAL INSUFFICIENCY >> she knows to avoid NSAIDs and incr fluid intake... ~  Labs 1/15 showed BUN= 42, Cr= 1.8  DEGENERATIVE JOINT DISEASE (ICD-715.90) & HIP PAIN, RIGHT (ICD-719.45) - currently taking OTC meds only but DrDean rec Vicodin for as needed use (she prefers Tylenol)... Eval by DrDean 3/09 w/ bone-on-bone right hip, tried injection & pain meds... right THR done 12/09...  VITAMIN D DEFICIENCY (ICD-268.9) ~  labs 5/09 showed Vit D level = 9... pt started on 50K weekly but she switched on her own to 1000u/d OTC. ~  labs 2/12 showed Vit D level = 18... asked to incr Vit D supplement to 2000 u daily (she never did). ~  Labs 2/13 showed Vit D level = 20... Asked to finally incr VitD supplement to 2000u daily... ~  Labs 3/14 showed Vit D = 12 & Rec to switch to 50K weekly Rx... ~  Labs 1/15 showed VitD = 50 & rec to continue VitD  50K weekly...  ANXIETY (ICD-300.00) - she is requesting a mild nerve pill & we have written ALPRAZOLAM 0.25mg  Prn use...  ANEMIA (ICD-285.9) - prev on Niferex 150mg /d, now FeSO4 325mg /d w/ Vit C 500mg ... 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 ~  last blood count 11/08 showed Hg=13.7, Fe=56... ~  labs 5/09 showed Hg= 14.2.[name]. ~  labs 2/10 in hosp showed Hg= 13 ~  [facility] 11/10 w/ lower GIB, & Hg down to 8.6- tc 1u PC up to 9.3.Marland KitchenMarland Kitchen ~  labs 12/10 showed Hg= 10.6.[name]. rec> continue oral Fe w/ VitC... ~  [facility] labs 7/11 showed Hg= 11.6 - 10.5, MCV= 70... rec> restart Fe. ~  labs 2/12 showed Hg= 12.4, MCV= 77, Fe= 56 (sat=10%)... continue Fe w/ VitC. ~  Labs 5/12 in hosp showed Hg= 12.3, MCV= 68, & she continues on FeSO4 daily w/ VitC... ~  Labs 2/13 showed Hg=13.1, MCV=78, Fe=66; rec to continue supplements... ~  Labs 3/14 showed Hg= 13.1 ~  Labs  9/14 showed Hg= 12.8, MCV=74 ~  Labs 1/15 showed Hg= 12.3, MCV=78 and rec to restart FeSO4 325mg  daily...  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   Past Surgical History  Procedure Laterality Date  . Total hip arthroplasty Right 12/09    Dr August Saucer  . Coronary angioplasty with stent placement  04/2008    "1; Dr Sharyn Lull" (10/06/2012)  . Coronary angioplasty  10/06/2012  . Dilation and curettage of uterus      Outpatient Encounter Prescriptions as of 03/23/2013  Medication Sig  . acetaminophen (TYLENOL) 325 MG tablet Take 325 mg by mouth every 6 (six) hours as needed. For pain  . ALPRAZolam (XANAX) 0.25 MG tablet Take 0.25 mg by mouth 3 (three) times daily as needed for anxiety.  [name] amiodarone (PACERONE) 200 MG tablet Take 200 mg by mouth daily.   [name] amLODipine (NORVASC) 5 MG tablet Take 5 mg by mouth daily.  [name] aspirin 81 MG tablet Take 81 mg by mouth daily.   . clopidogrel (PLAVIX) 75 MG tablet Take 1 tablet (75 mg total) by mouth daily with breakfast.  . ferrous gluconate  (FERGON) 325 MG tablet Take 325 mg by mouth daily with breakfast. With vitamin C tablet   . Ipratropium-Albuterol (COMBIVENT RESPIMAT) 20-100 MCG/ACT AERS respimat Inhale 1 puff into the lungs every 6 (six) hours.  [name] losartan (COZAAR) 100 MG tablet take 1 tablet by mouth once daily  . nitroGLYCERIN (NITROSTAT) 0.4 MG SL tablet Place 0.4 mg under the tongue every 5 (five) minutes as needed. May repeat x3   . pantoprazole (PROTONIX) 40 MG tablet Take 1 tablet (40 mg total) by mouth 2 (two) times daily.  . rosuvastatin (CRESTOR) 20 MG tablet Take 20 mg by mouth daily.   . vitamin C (ASCORBIC ACID) 500 MG tablet Take 500 mg by mouth daily. With iron tablet   . Vitamin D, Ergocalciferol, (DRISDOL) 50000 UNITS CAPS Take 1 capsule (50,000 Units total) by mouth every 7 (seven) days.  . [DISCONTINUED] omeprazole (PRILOSEC) 20 MG capsule 1 capsule twice daily before breakfsat and dinner    No Known Allergies   Current Medications, Allergies, Past Medical History, Past Surgical History, Family History, and Social History were reviewed in [employer] record.   Review of Systems         See HPI - all other systems neg except as noted... The patient complains of dyspnea on exertion.  The patient denies anorexia, fever, weight loss, weight gain, vision loss, decreased hearing, hoarseness, chest pain, syncope, peripheral edema, prolonged cough, headaches, hemoptysis, abdominal pain, melena, hematochezia, severe indigestion/heartburn, hematuria, incontinence, muscle weakness, suspicious skin lesions, transient blindness, difficulty walking, depression, unusual weight change, abnormal bleeding, enlarged lymph nodes, and angioedema.     Objective:   Physical Exam     WD, WN, 80 y/o BF in NAD... GENERAL:  Alert & oriented; pleasant & cooperative... HEENT:  [place]/AT, EOM-wnl, EACs-clear, TMs-wnl, NOSE-clear, THROAT-clear & wnl. NECK:  Supple w/ fairROM; no JVD; normal carotid impulses w/o  bruits; no thyromegaly or nodules palpated; no lymphadenopathy. CHEST:  Coarse BS w/ no wheezing, no rales, no signs of consolidation. HEART:  Regular Rhythm; gr 1/6 SEM without rubs or gallops detected... ABDOMEN:  Soft & nontender; normal bowel sounds; no organomegaly or masses palpated... EXT: without deformities, mod arthritic changes; s/p right THR; no varicose veins/ +venous insuffic/  tr edema . BACK:  sl  tender along right PSIS area... NEURO:  CN's intact; no focal neuro defict... DERM:  no lesions seen...  RADIOLOGY DATA:  Reviewed in the EPIC EMR & discussed w/ the patient...  LABORATORY DATA:  Reviewed in the EPIC EMR & discussed w/ the patient...     Assessment & Plan:    AB>  Improved overall & she had stopped all meds on her own=> using the COMBIVENT respimat prn...  HBP>  Fair control on the Losartan & Amlodipine; asked to monitor BP at home...  ASHD>  Followed by Maia Breslow for Cards> cath 8/14 w/ LAD instent restenosis, s/p successful PCI & she is back to baseline; Note: ER 9/14 switched her Pepcid to Omep20Bid but her Plavix is critically important to help patency of the LAD stent; therefore we will switch the Omep to PANTOPRAZOLE40Bid...  AFlutter>  DrHarwani has her on Amio 200mg  daily; her prev thyroid problems have resolved...  CHOL>  On Cres20 w/ good control of FLP...  THYROID DISEASE>  Prev Amio induced hyperthyroidism managed by DrKumar (on PTU prev) & resolved on lower dose of Amio...  GI>  Hx HH/ GERD/ Divertics/ Polyps>  She is followed by DrJacobs for GI, we will switch her PPI to PROTONIX40Bid due to her needed Plavix rx...  DJD>  She is followed by DrDean for Ortho...  Anemia>  Hg is OK but MCV is low, Fe=66 & she will continue her Fe & VitC supplements...  Anxiety>  Requesting a mild nerve pill & we wrote for Alprazolam..[name]

## 2013-03-24 ENCOUNTER — Ambulatory Visit: Payer: Medicare Other | Admitting: Pulmonary Disease

## 2013-03-24 ENCOUNTER — Encounter (HOSPITAL_COMMUNITY)
Admission: RE | Admit: 2013-03-24 | Discharge: 2013-03-24 | Disposition: A | Discharge status: Home / Self Care | Payer: Self-pay | Source: Ambulatory Visit | Attending: Cardiology | Admitting: Cardiology

## 2013-03-24 ENCOUNTER — Encounter (HOSPITAL_COMMUNITY): Payer: Medicare Other

## 2013-03-25 LAB — VITAMIN D 25 HYDROXY (VIT D DEFICIENCY, FRACTURES): VIT D 25 HYDROXY: 50 ng/mL (ref 30–89)

## 2013-03-26 ENCOUNTER — Encounter (HOSPITAL_COMMUNITY)
Admission: RE | Admit: 2013-03-26 | Discharge: 2013-03-26 | Disposition: A | Discharge status: Home / Self Care | Payer: Self-pay | Source: Ambulatory Visit | Attending: Cardiology | Admitting: Cardiology

## 2013-03-26 ENCOUNTER — Encounter (HOSPITAL_COMMUNITY): Payer: Medicare Other

## 2013-03-29 ENCOUNTER — Encounter (HOSPITAL_COMMUNITY): Payer: Medicare Other

## 2013-03-29 ENCOUNTER — Telehealth: Payer: Self-pay | Admitting: Pulmonary Disease

## 2013-03-29 NOTE — Telephone Encounter (Signed)
Notes Recorded by Noralee Space, MD on 03/25/2013 at 5:06 PM Please notify patient>  FLP looks great on Cres20> continue same! Chems, LFTs, Thyroid are WNL x mild renal insuffic w/ Cr=1.8> Rec- NO NSAIDs & incr fluids by mouth... CBC shows borderline anemia w/ small cells & Rec daily OTC IRON supplement... VitD level is improved to normal on the VitD 50K weekly pill> Rec to continue same....   I spoke with patient about results and she verbalized understanding and had no questions

## 2013-03-31 ENCOUNTER — Encounter (HOSPITAL_COMMUNITY): Payer: Medicare Other

## 2013-04-02 ENCOUNTER — Encounter (HOSPITAL_COMMUNITY)
Admission: RE | Admit: 2013-04-02 | Discharge: 2013-04-02 | Disposition: A | Discharge status: Home / Self Care | Payer: Self-pay | Source: Ambulatory Visit | Attending: Cardiology | Admitting: Cardiology

## 2013-04-02 ENCOUNTER — Encounter (HOSPITAL_COMMUNITY): Payer: Medicare Other

## 2013-04-05 ENCOUNTER — Encounter (HOSPITAL_COMMUNITY)
Admission: RE | Admit: 2013-04-05 | Discharge: 2013-04-05 | Disposition: A | Discharge status: Home / Self Care | Payer: Self-pay | Source: Ambulatory Visit | Attending: Cardiology | Admitting: Cardiology

## 2013-04-05 DIAGNOSIS — Z9861 Coronary angioplasty status: Secondary | ICD-10-CM | POA: Insufficient documentation

## 2013-04-05 DIAGNOSIS — Z5189 Encounter for other specified aftercare: Secondary | ICD-10-CM | POA: Insufficient documentation

## 2013-04-05 DIAGNOSIS — I4891 Unspecified atrial fibrillation: Secondary | ICD-10-CM | POA: Insufficient documentation

## 2013-04-05 DIAGNOSIS — I2510 Atherosclerotic heart disease of native coronary artery without angina pectoris: Secondary | ICD-10-CM | POA: Insufficient documentation

## 2013-04-07 ENCOUNTER — Encounter (HOSPITAL_COMMUNITY)
Admission: RE | Admit: 2013-04-07 | Discharge: 2013-04-07 | Disposition: A | Discharge status: Home / Self Care | Payer: Self-pay | Source: Ambulatory Visit | Attending: Cardiology | Admitting: Cardiology

## 2013-04-09 ENCOUNTER — Encounter (HOSPITAL_COMMUNITY)
Admission: RE | Admit: 2013-04-09 | Discharge: 2013-04-09 | Disposition: A | Discharge status: Home / Self Care | Payer: Self-pay | Source: Ambulatory Visit | Attending: Cardiology | Admitting: Cardiology

## 2013-04-12 ENCOUNTER — Encounter (HOSPITAL_COMMUNITY)
Admission: RE | Admit: 2013-04-12 | Discharge: 2013-04-12 | Disposition: A | Discharge status: Home / Self Care | Payer: Self-pay | Source: Ambulatory Visit | Attending: Cardiology | Admitting: Cardiology

## 2013-04-14 ENCOUNTER — Encounter (HOSPITAL_COMMUNITY)
Admission: RE | Admit: 2013-04-14 | Discharge: 2013-04-14 | Disposition: A | Discharge status: Home / Self Care | Payer: Self-pay | Source: Ambulatory Visit | Attending: Cardiology | Admitting: Cardiology

## 2013-04-15 ENCOUNTER — Telehealth: Payer: Self-pay | Admitting: Pulmonary Disease

## 2013-04-15 MED ORDER — AZITHROMYCIN 250 MG PO TABS: ORAL_TABLET | ORAL | Status: AC

## 2013-04-15 NOTE — Telephone Encounter (Signed)
Per CY - okay to give Zpack.  Rx has been sent in. Pt is aware.

## 2013-04-15 NOTE — Telephone Encounter (Signed)
Pt c/o nasal congestion, PND, non prod cough, temp last night of 99.0 x yesterady couple days. Not taking anything OTC. No chills, no sweets, no body aches. Pt would like ABX called in. Please advise SN thanks  No Known Allergies

## 2013-04-16 ENCOUNTER — Ambulatory Visit (INDEPENDENT_AMBULATORY_CARE_PROVIDER_SITE_OTHER): Payer: Medicare Other | Admitting: Adult Health

## 2013-04-16 ENCOUNTER — Encounter (HOSPITAL_COMMUNITY): Payer: Medicare Other

## 2013-04-16 ENCOUNTER — Encounter: Payer: Self-pay | Admitting: Adult Health

## 2013-04-16 ENCOUNTER — Ambulatory Visit (INDEPENDENT_AMBULATORY_CARE_PROVIDER_SITE_OTHER)
Admission: RE | Admit: 2013-04-16 | Discharge: 2013-04-16 | Disposition: A | Discharge status: Home / Self Care | Payer: Medicare Other | Source: Ambulatory Visit | Attending: Adult Health | Admitting: Adult Health

## 2013-04-16 VITALS — BP 144/80 | HR 77 | Temp 98.3°F | Ht 62.0 in | Wt 178.0 lb

## 2013-04-16 DIAGNOSIS — R0609 Other forms of dyspnea: Secondary | ICD-10-CM

## 2013-04-16 DIAGNOSIS — R0600 Dyspnea, unspecified: Secondary | ICD-10-CM

## 2013-04-16 DIAGNOSIS — R6889 Other general symptoms and signs: Secondary | ICD-10-CM

## 2013-04-16 DIAGNOSIS — J45909 Unspecified asthma, uncomplicated: Secondary | ICD-10-CM

## 2013-04-16 DIAGNOSIS — R0989 Other specified symptoms and signs involving the circulatory and respiratory systems: Secondary | ICD-10-CM

## 2013-04-16 NOTE — Patient Instructions (Addendum)
Finish a Z-Pak as directed. Mucinex DM twice daily as needed. For cough and congestion. Fluids and rest. Tylenol as needed. Follow up Dr. Lenna Gilford as planned and as needed. Please contact office for sooner follow up if symptoms do not improve or worsen or seek emergency care

## 2013-04-16 NOTE — Progress Notes (Signed)
Subjective:    Patient ID: Virginia Casey, female    DOB: 1924/12/14, 80 y.o.   MRN: [id number]  HPI 80 y/o BF   04/16/2013 Acute OV  Complains of increased SOB, chest congestion, dry cough, head congestion w/ clear drainage, PND, sneezing x2days, Fever 101. Worse last night with chills, body aches, increased dyspnea.  Began zpak last night by doc on call. CXR today shows no acute changes .  Appetite is decreased with no nausea, vomiting, diarrhea. Patient denies any leg swelling, calf pain, orthopnea, hemoptysis. She has not taken any medications for treatment. O2 saturation on room air. Today is 97%. Flu swab neg .         Problem List:  Hearing Loss >> she saw DrCrossley 3/13 w/ a high freq hearing loss & he removed creumen impactions' they are holding off on hearing aides for now...  ASTHMATIC BRONCHITIS (ICD-466.0) - breathing is stable now- s/p recent exacerbation... ~  CXR 1/12 showed basilar atx & large HH, cardiomeg, osteopenia, NAD... ~  [facility] 5/12 w/ refractory AB exac & improved w/ standard therapy & brief course of Pred; CXR- similar, NAD.[name]. ~  CXR 7/13 showed norm heart size, large HH, clear lungs, NAD.[name]. ~  7/14:  She presented w/ a mild exac brought on by the stress of a trip to [place] etc=> Rx w/ COMBIVENT every 6h as needed... ~  CXR 7/14 showed cardiomeg, largeHH, NAD... We attempted PFTs but she was unable to perform... Os sats were 98% on RA...  HYPERTENSION (ICD-401.9) - now on LOSARTAN 100mg /d & AMLODIPINE 5mg /d (and off her prev Ramipril due to asthma & Imdur due to HA) ...  ~  8/12:  BP= 110/72 & not checking BP's at home; she denies visual changes, CP, palipit, syncope, dyspnea, edema, etc... ~  2/13:  BP= 122/82 & she is stable, mostly asymptomatic w/o CP, palpit, SOB, etc... ~  8/13:  BP= 124/86 & she remains stable... ~  3/14: on Losartan100 & off Amlod5; BP= 130/84 & she denies CP, palpit, SOB, edema; rec to continue same meds + diet. ~  7/14: on  Losartan100 & off Amlod5; BP= 132/92 & she denies CP, palpit, or edema... ~  10/14: now on Losartan100 + Amlodipine5; BP= 118/88 & she remains asymptomatic...  ATHEROSCLEROTIC HEART DISEASE (ICD-414.00) & PERIPHERAL VASCULAR DISEASE (ICD-443.9) - regularly on ASA 81mg /d, & off Plavix... followed by [facility] for Cards (we Casey not have his notes to review med changes)>> ~  hosp in Jan08 w/ syncope and MRI Brain showed mild atrophy & sm vessel dis;  MRA showed basilar art narrowing & mod stenosis in RICA cavernous segment... CDopplers however were WNL w/ antegrade vertebral flow, and no signif ICA stenoses detected... ~  hosp Feb10 by [facility] w/ non-STEMI- s/p PTCA and stent in the LAD. ~  hosp 7/11 by [facility] w/ CP, Abn Myoview & cath w/ 70% midLAD in-stent resteosis; s/p PTCA & started back on Plavix... ~  she is in Cardiac Rehab & really likes it... ~  7/13:  She went to the ER w/ chest discomfort; ruled out for ischemia (EKG= SBrday, rate52, wnl/NAD) & followed up w/ DrHarwani> he did MYOVIEW 8/13 which was neg for ischemia or infarction, norm wall motion & EF=70%... ~  7/14:  She continues to f/u w/ [facility] her cardiologist;  She will see him soon to f/u from this [place] trip for additional test => Myoview was Abn & Cath 8/14 w/ in-stent restenosis,  s/p PCI... ~  10/14: now on ASA81 + Plavix75 and enrolled in cardiac rehab which she loves... (Note: Omep20Bid give via ER 9/14 is being changed to Protonix40Bid due to her Plavix Rx)...  ATRIAL FLUTTER, PAROXYSMAL (ICD-427.32) - this occured 2/10 following her cath, PTCA, stent... he diagnosed flutter w/ 2:1 block... currently taking AMIODARONE 200mg /d...  HYPERCHOLESTEROLEMIA (ICD-272.0) - on CRESTOR 20mg /d... ~  [place] 11/08 showed TChol 161, TG 39, HDL 62, LDL 92... continue same med. ~  [facility] 5/09 showed TChol 209, TG 70, HDL 74, LDL 124... rec- better diet, get weight down! ~  FLP in hosp 2/10 showed TChol 130, TG 69, HDL 45, LDL 71 ~  she  reports that [facility] does fasting labs... ~  FLP 7/11 in hosp showed TChol 105, TG 30, HDL 66, LDL 33 ~  FLP 2/12 on [place] showed TChol 97, TG 38, HDL 50, LDL 39... copy to Cards ?decr to 10mg ? ~  FLP 2/13 on Cres20 showed TChol 118, TG 42, HDL 60, LDL 50... Continue same. ~  [place] 3/14 on Cres20 showed TChol 128, TG 32, HDL 68, LDL 53   THYROID DISEASE >> Hx Amiodarone induced hyperthyroidism diagnosed 2/12 & referred to [facility], treated w/ PTU (now off) & DrHarwani tapered the Amio... ~  1/13:  She had f/u DrKumar> everything improve4d w/ reduction in Amio dose; clinically euthyroid & no goiter palpated; TFT's in his office were WNL==> TSH=3.74,  FreeT3=2.4 (2.0-4.4), FreeT4=0.91 (0.61-1.12) ~  Labs 3/14 showed TSH= 2.11  HIATAL HERNIA (ICD-553.3) - prev on Nexium & Carafate Liq- but changed to PEPCID 20mg Bid by [facility] due to need for Plavix... she has a giant HH measuring 10cm per DrSam, w/ erosions in the pouch... last EGD by DrSam 4/08... hernia is seen on her CXRs. ~  9/14: she went to the Er w/ Epig pain & they felt it was reflux related due to her [place]; they switched Pepcid to Omep20Bid but she will need to change this to [place] due to the Plavix...  DIVERTICULOSIS OF COLON (ICD-562.10) & COLONIC POLYPS (ICD-211.3) ~  colonoscopy 11/06 showed divertics and 2- 55mm polyps... f/u planned in 4-66yrs... ~  Hosp 11/10 w/ lower GIB believed due to divertics... colonoscopy by DrJacobs showed severe divertics, no bleeding site, 70mm polyp removed...  DEGENERATIVE JOINT DISEASE (ICD-715.90) & HIP PAIN, RIGHT (ICD-719.45) - currently taking OTC meds only but DrDean rec Vicodin for as needed use (she prefers Tylenol)... Eval by DrDean 3/09 w/ bone-on-bone right hip, tried injection & pain meds... right THR done 12/09...  VITAMIN D DEFICIENCY (ICD-268.9) ~  labs 5/09 showed Vit D level = 9... pt started on 50K weekly but she switched on her own to 1000u/d OTC. ~  labs 2/12 showed Vit D  level = 18... asked to incr Vit D supplement to 2000 u daily (she never did). ~  Labs 2/13 showed Vit D level = 20... Asked to finally incr VitD supplement to 2000u daily... ~  Labs 3/14 showed Vit D = 12 & Rec to switch to 50K weekly Rx...  ANXIETY (ICD-300.00) - she is requesting a mild nerve pill & we have written ALPRAZOLAM 0.25mg  Prn use...  ANEMIA (ICD-285.9) - prev on Niferex 150mg /d, now FeSO4 325mg /d w/ Vit C 500mg ... 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 ~  last blood count 11/08 showed Hg=13.7, Fe=56... ~  labs 5/09 showed Hg= 14.2.[name]. ~  labs 2/10 in hosp showed Hg= 13 ~  Hosp 11/10 w/ lower  GIB, & Hg down to 8.6- tc 1u PC up to 9.3.Marland KitchenMarland Kitchen ~  labs 12/10 showed Hg= 10.6.[name]. rec> continue oral Fe w/ VitC... ~  [facility] labs 7/11 showed Hg= 11.6 - 10.5, MCV= 70... rec> restart Fe. ~  labs 2/12 showed Hg= 12.4, MCV= 77, Fe= 56 (sat=10%)... continue Fe w/ VitC. ~  Labs 5/12 in hosp showed Hg= 12.3, MCV= 68, & she continues on FeSO4 daily w/ VitC... ~  Labs 2/13 showed Hg=13.1, MCV=78, Fe=66; rec to continue supplements... 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   Past Surgical History  Procedure Laterality Date  . Total hip arthroplasty Right 12/09    Dr Marlou Sa  . Coronary angioplasty with stent placement  04/2008    "1; Dr Terrence Dupont" (10/06/2012)  . Coronary angioplasty  10/06/2012  . Dilation and curettage of uterus      Outpatient Encounter Prescriptions as of 04/16/2013  Medication Sig  . acetaminophen (TYLENOL) 325 MG tablet Take 325 mg by mouth every 6 (six) hours as needed. For pain  . ALPRAZolam (XANAX) 0.25 MG tablet Take 0.25 mg by mouth 3 (three) times daily as needed for anxiety.  [name] amiodarone (PACERONE) 200 MG tablet Take 200 mg by mouth daily.   [name] amLODipine (NORVASC) 5 MG tablet Take 5 mg by mouth daily.  [name] aspirin 81 MG tablet  Take 81 mg by mouth daily.   [name] azithromycin (ZITHROMAX Z-PAK) 250 MG tablet Take 2 tablets (500 mg) on  Day 1,  followed by 1 tablet (250 mg) once daily on Days 2 through 5.  . clopidogrel (PLAVIX) 75 MG tablet Take 1 tablet (75 mg total) by mouth daily with breakfast.  . ferrous gluconate (FERGON) 325 MG tablet Take 325 mg by mouth daily with breakfast. With vitamin C tablet   . Ipratropium-Albuterol (COMBIVENT RESPIMAT) 20-100 MCG/ACT AERS respimat Inhale 1 puff into the lungs every 6 (six) hours.  [name] losartan (COZAAR) 100 MG tablet take 1 tablet by mouth once daily  . nitroGLYCERIN (NITROSTAT) 0.4 MG SL tablet Place 0.4 mg under the tongue every 5 (five) minutes as needed. May repeat x3   . pantoprazole (PROTONIX) 40 MG tablet Take 1 tablet (40 mg total) by mouth 2 (two) times daily.  . rosuvastatin (CRESTOR) 20 MG tablet Take 20 mg by mouth daily.   . vitamin C (ASCORBIC ACID) 500 MG tablet Take 500 mg by mouth daily. With iron tablet   . Vitamin D, Ergocalciferol, (DRISDOL) 50000 UNITS CAPS Take 1 capsule (50,000 Units total) by mouth every 7 (seven) days.    No Known Allergies   Current Medications, Allergies, Past Medical History, Past Surgical History, Family History, and Social History were reviewed in [employer] record.   Review of Systems         See HPI - all other systems neg except as noted... The patient complains of dyspnea on exertion.  The patient denies anorexia,  weight loss, weight gain, vision loss, decreased hearing, hoarseness, chest pain, syncope, peripheral edema,  headaches, hemoptysis, abdominal pain, melena, hematochezia, severe indigestion/heartburn, hematuria, incontinence, muscle weakness, suspicious skin lesions, transient blindness, difficulty walking, depression, unusual weight change, abnormal bleeding, enlarged lymph nodes, and angioedema.     Objective:   Physical Exam     WD, WN, 80 y/o BF in NAD... GENERAL:  Alert & oriented;  pleasant & cooperative... HEENT:  [place]/AT, EOM-wnl, EACs-clear, TMs-wnl, NOSE-clear, THROAT-clear & wnl. NECK:  Supple w/ fairROM; no JVD; normal carotid impulses w/o bruits; no thyromegaly or nodules palpated; no lymphadenopathy. CHEST:  Coarse BS w/ no wheezing, no rales, no signs of consolidation. HEART:  Regular Rhythm; gr 1/6 SEM without rubs or gallops detected... ABDOMEN:  Soft & nontender; normal bowel sounds; no organomegaly or masses palpated... EXT: without deformities, mod arthritic changes; s/p right THR; no varicose veins/ +venous insuffic/  tr edema .  NEURO:  no focal neuro defict... DERM:  no lesions seen.Marland KitchenMarland Kitchen

## 2013-04-19 ENCOUNTER — Encounter (HOSPITAL_COMMUNITY): Payer: Medicare Other

## 2013-04-19 NOTE — Assessment & Plan Note (Signed)
Flare   Plan  Finish a Z-Pak as directed. Mucinex DM twice daily as needed. For cough and congestion. Fluids and rest. Tylenol as needed. Follow up Dr. Lenna Gilford as planned and as needed. Please contact office for sooner follow up if symptoms do not improve or worsen or seek emergency care

## 2013-04-21 ENCOUNTER — Encounter (HOSPITAL_COMMUNITY): Payer: Medicare Other

## 2013-04-22 LAB — POCT INFLUENZA A/B
INFLUENZA A, POC: NEGATIVE
Influenza B, POC: NEGATIVE

## 2013-04-23 ENCOUNTER — Encounter (HOSPITAL_COMMUNITY): Payer: Medicare Other

## 2013-04-26 ENCOUNTER — Encounter (HOSPITAL_COMMUNITY): Payer: Medicare Other

## 2013-04-28 ENCOUNTER — Encounter (HOSPITAL_COMMUNITY): Admission: RE | Admit: 2013-04-28 | Payer: Medicare Other | Source: Ambulatory Visit

## 2013-04-30 ENCOUNTER — Encounter (HOSPITAL_COMMUNITY): Payer: Medicare Other

## 2013-05-03 ENCOUNTER — Encounter (HOSPITAL_COMMUNITY)
Admission: RE | Admit: 2013-05-03 | Discharge: 2013-05-03 | Disposition: A | Discharge status: Home / Self Care | Payer: Self-pay | Source: Ambulatory Visit | Attending: Cardiology | Admitting: Cardiology

## 2013-05-03 DIAGNOSIS — Z5189 Encounter for other specified aftercare: Secondary | ICD-10-CM | POA: Insufficient documentation

## 2013-05-03 DIAGNOSIS — I4891 Unspecified atrial fibrillation: Secondary | ICD-10-CM | POA: Insufficient documentation

## 2013-05-03 DIAGNOSIS — I2510 Atherosclerotic heart disease of native coronary artery without angina pectoris: Secondary | ICD-10-CM | POA: Insufficient documentation

## 2013-05-03 DIAGNOSIS — Z9861 Coronary angioplasty status: Secondary | ICD-10-CM | POA: Insufficient documentation

## 2013-05-05 ENCOUNTER — Encounter (HOSPITAL_COMMUNITY)
Admission: RE | Admit: 2013-05-05 | Discharge: 2013-05-05 | Disposition: A | Discharge status: Home / Self Care | Payer: Self-pay | Source: Ambulatory Visit | Attending: Cardiology | Admitting: Cardiology

## 2013-05-07 ENCOUNTER — Encounter (HOSPITAL_COMMUNITY): Payer: Medicare Other

## 2013-05-10 ENCOUNTER — Encounter (HOSPITAL_COMMUNITY): Payer: Medicare Other

## 2013-05-12 ENCOUNTER — Encounter (HOSPITAL_COMMUNITY): Payer: Medicare Other

## 2013-05-14 ENCOUNTER — Encounter (HOSPITAL_COMMUNITY): Payer: Medicare Other

## 2013-05-17 ENCOUNTER — Encounter (HOSPITAL_COMMUNITY)
Admission: RE | Admit: 2013-05-17 | Discharge: 2013-05-17 | Disposition: A | Discharge status: Home / Self Care | Payer: Self-pay | Source: Ambulatory Visit | Attending: Cardiology | Admitting: Cardiology

## 2013-05-19 ENCOUNTER — Encounter (HOSPITAL_COMMUNITY)
Admission: RE | Admit: 2013-05-19 | Discharge: 2013-05-19 | Disposition: A | Discharge status: Home / Self Care | Payer: Self-pay | Source: Ambulatory Visit | Attending: Cardiology | Admitting: Cardiology

## 2013-05-21 ENCOUNTER — Encounter (HOSPITAL_COMMUNITY)
Admission: RE | Admit: 2013-05-21 | Discharge: 2013-05-21 | Disposition: A | Discharge status: Home / Self Care | Payer: Self-pay | Source: Ambulatory Visit | Attending: Cardiology | Admitting: Cardiology

## 2013-05-24 ENCOUNTER — Encounter (HOSPITAL_COMMUNITY)
Admission: RE | Admit: 2013-05-24 | Discharge: 2013-05-24 | Disposition: A | Discharge status: Home / Self Care | Payer: Self-pay | Source: Ambulatory Visit | Attending: Cardiology | Admitting: Cardiology

## 2013-05-26 ENCOUNTER — Encounter (HOSPITAL_COMMUNITY): Payer: Medicare Other

## 2013-05-28 ENCOUNTER — Encounter (HOSPITAL_COMMUNITY)
Admission: RE | Admit: 2013-05-28 | Discharge: 2013-05-28 | Disposition: A | Discharge status: Home / Self Care | Payer: Self-pay | Source: Ambulatory Visit | Attending: Cardiology | Admitting: Cardiology

## 2013-05-31 ENCOUNTER — Encounter (HOSPITAL_COMMUNITY): Payer: Medicare Other

## 2013-06-02 ENCOUNTER — Encounter (HOSPITAL_COMMUNITY)
Admission: RE | Admit: 2013-06-02 | Discharge: 2013-06-02 | Disposition: A | Discharge status: Home / Self Care | Payer: Self-pay | Source: Ambulatory Visit | Attending: Cardiology | Admitting: Cardiology

## 2013-06-02 DIAGNOSIS — Z5189 Encounter for other specified aftercare: Secondary | ICD-10-CM | POA: Insufficient documentation

## 2013-06-02 DIAGNOSIS — I2510 Atherosclerotic heart disease of native coronary artery without angina pectoris: Secondary | ICD-10-CM | POA: Insufficient documentation

## 2013-06-02 DIAGNOSIS — I4891 Unspecified atrial fibrillation: Secondary | ICD-10-CM | POA: Insufficient documentation

## 2013-06-02 DIAGNOSIS — Z9861 Coronary angioplasty status: Secondary | ICD-10-CM | POA: Insufficient documentation

## 2013-06-04 ENCOUNTER — Encounter (HOSPITAL_COMMUNITY): Payer: Medicare Other

## 2013-06-07 ENCOUNTER — Encounter (HOSPITAL_COMMUNITY): Payer: Medicare Other

## 2013-06-09 ENCOUNTER — Encounter (HOSPITAL_COMMUNITY)
Admission: RE | Admit: 2013-06-09 | Discharge: 2013-06-09 | Disposition: A | Discharge status: Home / Self Care | Payer: Self-pay | Source: Ambulatory Visit | Attending: Cardiology | Admitting: Cardiology

## 2013-06-09 ENCOUNTER — Other Ambulatory Visit: Payer: Self-pay | Admitting: Pulmonary Disease

## 2013-06-11 ENCOUNTER — Encounter (HOSPITAL_COMMUNITY)
Admission: RE | Admit: 2013-06-11 | Discharge: 2013-06-11 | Disposition: A | Discharge status: Home / Self Care | Payer: Self-pay | Source: Ambulatory Visit | Attending: Cardiology | Admitting: Cardiology

## 2013-06-14 ENCOUNTER — Encounter (HOSPITAL_COMMUNITY)
Admission: RE | Admit: 2013-06-14 | Discharge: 2013-06-14 | Disposition: A | Discharge status: Home / Self Care | Payer: Self-pay | Source: Ambulatory Visit | Attending: Cardiology | Admitting: Cardiology

## 2013-06-15 ENCOUNTER — Other Ambulatory Visit: Payer: Self-pay | Admitting: Pulmonary Disease

## 2013-06-16 ENCOUNTER — Encounter (HOSPITAL_COMMUNITY): Payer: Medicare Other

## 2013-06-18 ENCOUNTER — Encounter (HOSPITAL_COMMUNITY): Payer: Medicare Other

## 2013-06-21 ENCOUNTER — Encounter (HOSPITAL_COMMUNITY): Payer: Medicare Other

## 2013-06-23 ENCOUNTER — Encounter (HOSPITAL_COMMUNITY): Payer: Medicare Other

## 2013-06-25 ENCOUNTER — Encounter (HOSPITAL_COMMUNITY): Payer: Medicare Other

## 2013-06-28 ENCOUNTER — Encounter (HOSPITAL_COMMUNITY)
Admission: RE | Admit: 2013-06-28 | Discharge: 2013-06-28 | Disposition: A | Discharge status: Home / Self Care | Payer: Self-pay | Source: Ambulatory Visit | Attending: Cardiology | Admitting: Cardiology

## 2013-06-30 ENCOUNTER — Encounter (HOSPITAL_COMMUNITY)
Admission: RE | Admit: 2013-06-30 | Discharge: 2013-06-30 | Disposition: A | Discharge status: Home / Self Care | Payer: Self-pay | Source: Ambulatory Visit | Attending: Cardiology | Admitting: Cardiology

## 2013-07-02 ENCOUNTER — Encounter (HOSPITAL_COMMUNITY): Payer: Medicare Other

## 2013-07-02 DIAGNOSIS — I2510 Atherosclerotic heart disease of native coronary artery without angina pectoris: Secondary | ICD-10-CM | POA: Insufficient documentation

## 2013-07-02 DIAGNOSIS — Z5189 Encounter for other specified aftercare: Secondary | ICD-10-CM | POA: Insufficient documentation

## 2013-07-02 DIAGNOSIS — I4891 Unspecified atrial fibrillation: Secondary | ICD-10-CM | POA: Insufficient documentation

## 2013-07-02 DIAGNOSIS — Z9861 Coronary angioplasty status: Secondary | ICD-10-CM | POA: Insufficient documentation

## 2013-07-05 ENCOUNTER — Encounter (HOSPITAL_COMMUNITY)
Admission: RE | Admit: 2013-07-05 | Discharge: 2013-07-05 | Disposition: A | Discharge status: Home / Self Care | Payer: Self-pay | Source: Ambulatory Visit | Attending: Cardiology | Admitting: Cardiology

## 2013-07-07 ENCOUNTER — Encounter (HOSPITAL_COMMUNITY)
Admission: RE | Admit: 2013-07-07 | Discharge: 2013-07-07 | Disposition: A | Discharge status: Home / Self Care | Payer: Self-pay | Source: Ambulatory Visit | Attending: Cardiology | Admitting: Cardiology

## 2013-07-09 ENCOUNTER — Encounter (HOSPITAL_COMMUNITY): Payer: Medicare Other

## 2013-07-12 ENCOUNTER — Encounter (HOSPITAL_COMMUNITY)
Admission: RE | Admit: 2013-07-12 | Discharge: 2013-07-12 | Disposition: A | Discharge status: Home / Self Care | Payer: Self-pay | Source: Ambulatory Visit | Attending: Cardiology | Admitting: Cardiology

## 2013-07-14 ENCOUNTER — Encounter (HOSPITAL_COMMUNITY)
Admission: RE | Admit: 2013-07-14 | Discharge: 2013-07-14 | Disposition: A | Discharge status: Home / Self Care | Payer: Self-pay | Source: Ambulatory Visit | Attending: Cardiology | Admitting: Cardiology

## 2013-07-16 ENCOUNTER — Encounter (HOSPITAL_COMMUNITY)
Admission: RE | Admit: 2013-07-16 | Discharge: 2013-07-16 | Disposition: A | Discharge status: Home / Self Care | Payer: Self-pay | Source: Ambulatory Visit | Attending: Cardiology | Admitting: Cardiology

## 2013-07-19 ENCOUNTER — Encounter (HOSPITAL_COMMUNITY)
Admission: RE | Admit: 2013-07-19 | Discharge: 2013-07-19 | Disposition: A | Discharge status: Home / Self Care | Payer: Self-pay | Source: Ambulatory Visit | Attending: Cardiology | Admitting: Cardiology

## 2013-07-21 ENCOUNTER — Encounter (HOSPITAL_COMMUNITY)
Admission: RE | Admit: 2013-07-21 | Discharge: 2013-07-21 | Disposition: A | Discharge status: Home / Self Care | Payer: Self-pay | Source: Ambulatory Visit | Attending: Cardiology | Admitting: Cardiology

## 2013-07-23 ENCOUNTER — Encounter (HOSPITAL_COMMUNITY): Payer: Medicare Other

## 2013-07-28 ENCOUNTER — Encounter (HOSPITAL_COMMUNITY)
Admission: RE | Admit: 2013-07-28 | Discharge: 2013-07-28 | Disposition: A | Discharge status: Home / Self Care | Payer: Self-pay | Source: Ambulatory Visit | Attending: Cardiology | Admitting: Cardiology

## 2013-07-30 ENCOUNTER — Encounter (HOSPITAL_COMMUNITY)
Admission: RE | Admit: 2013-07-30 | Discharge: 2013-07-30 | Disposition: A | Discharge status: Home / Self Care | Payer: Self-pay | Source: Ambulatory Visit | Attending: Cardiology | Admitting: Cardiology

## 2013-08-02 ENCOUNTER — Encounter (HOSPITAL_COMMUNITY)
Admission: RE | Admit: 2013-08-02 | Discharge: 2013-08-02 | Disposition: A | Discharge status: Home / Self Care | Payer: Self-pay | Source: Ambulatory Visit | Attending: Cardiology | Admitting: Cardiology

## 2013-08-02 DIAGNOSIS — Z9861 Coronary angioplasty status: Secondary | ICD-10-CM | POA: Insufficient documentation

## 2013-08-02 DIAGNOSIS — Z5189 Encounter for other specified aftercare: Secondary | ICD-10-CM | POA: Insufficient documentation

## 2013-08-02 DIAGNOSIS — I4891 Unspecified atrial fibrillation: Secondary | ICD-10-CM | POA: Insufficient documentation

## 2013-08-02 DIAGNOSIS — I2510 Atherosclerotic heart disease of native coronary artery without angina pectoris: Secondary | ICD-10-CM | POA: Insufficient documentation

## 2013-08-04 ENCOUNTER — Encounter (HOSPITAL_COMMUNITY): Payer: Medicare Other

## 2013-08-06 ENCOUNTER — Encounter (HOSPITAL_COMMUNITY): Payer: Medicare Other

## 2013-08-09 ENCOUNTER — Encounter (HOSPITAL_COMMUNITY): Payer: Medicare Other

## 2013-08-11 ENCOUNTER — Encounter (HOSPITAL_COMMUNITY): Payer: Medicare Other

## 2013-08-13 ENCOUNTER — Encounter (HOSPITAL_COMMUNITY): Payer: Medicare Other

## 2013-08-16 ENCOUNTER — Encounter (HOSPITAL_COMMUNITY): Payer: Medicare Other

## 2013-08-18 ENCOUNTER — Encounter (HOSPITAL_COMMUNITY): Payer: Medicare Other

## 2013-08-20 ENCOUNTER — Encounter (HOSPITAL_COMMUNITY): Payer: Medicare Other

## 2013-08-23 ENCOUNTER — Encounter (HOSPITAL_COMMUNITY): Payer: Medicare Other

## 2013-08-25 ENCOUNTER — Encounter (HOSPITAL_COMMUNITY): Payer: Medicare Other

## 2013-08-27 ENCOUNTER — Encounter (HOSPITAL_COMMUNITY)
Admission: RE | Admit: 2013-08-27 | Discharge: 2013-08-27 | Disposition: A | Discharge status: Home / Self Care | Payer: Self-pay | Source: Ambulatory Visit | Attending: Cardiology | Admitting: Cardiology

## 2013-08-30 ENCOUNTER — Encounter (HOSPITAL_COMMUNITY): Payer: Medicare Other

## 2013-09-01 ENCOUNTER — Encounter (HOSPITAL_COMMUNITY): Payer: Medicare Other

## 2013-09-01 DIAGNOSIS — I4891 Unspecified atrial fibrillation: Secondary | ICD-10-CM | POA: Insufficient documentation

## 2013-09-01 DIAGNOSIS — Z9861 Coronary angioplasty status: Secondary | ICD-10-CM | POA: Insufficient documentation

## 2013-09-01 DIAGNOSIS — I2510 Atherosclerotic heart disease of native coronary artery without angina pectoris: Secondary | ICD-10-CM | POA: Insufficient documentation

## 2013-09-01 DIAGNOSIS — Z5189 Encounter for other specified aftercare: Secondary | ICD-10-CM | POA: Insufficient documentation

## 2013-09-03 ENCOUNTER — Encounter (HOSPITAL_COMMUNITY): Payer: Medicare Other

## 2013-09-06 ENCOUNTER — Encounter (HOSPITAL_COMMUNITY): Payer: Medicare Other

## 2013-09-08 ENCOUNTER — Encounter (HOSPITAL_COMMUNITY): Payer: Medicare Other

## 2013-09-10 ENCOUNTER — Encounter (HOSPITAL_COMMUNITY): Payer: Medicare Other

## 2013-09-13 ENCOUNTER — Encounter (HOSPITAL_COMMUNITY): Payer: Medicare Other

## 2013-09-15 ENCOUNTER — Encounter (HOSPITAL_COMMUNITY): Payer: Medicare Other

## 2013-09-15 ENCOUNTER — Other Ambulatory Visit: Payer: Self-pay | Admitting: Pulmonary Disease

## 2013-09-17 ENCOUNTER — Encounter (HOSPITAL_COMMUNITY): Payer: Medicare Other

## 2013-09-20 ENCOUNTER — Ambulatory Visit (INDEPENDENT_AMBULATORY_CARE_PROVIDER_SITE_OTHER): Payer: Medicare Other | Admitting: Pulmonary Disease

## 2013-09-20 ENCOUNTER — Encounter: Payer: Self-pay | Admitting: Pulmonary Disease

## 2013-09-20 ENCOUNTER — Other Ambulatory Visit (INDEPENDENT_AMBULATORY_CARE_PROVIDER_SITE_OTHER): Payer: Medicare Other

## 2013-09-20 ENCOUNTER — Encounter (HOSPITAL_COMMUNITY): Payer: Medicare Other

## 2013-09-20 VITALS — BP 130/80 | HR 58 | Temp 97.7°F | Ht 61.0 in | Wt 178.2 lb

## 2013-09-20 DIAGNOSIS — I10 Essential (primary) hypertension: Secondary | ICD-10-CM

## 2013-09-20 DIAGNOSIS — I4892 Unspecified atrial flutter: Secondary | ICD-10-CM

## 2013-09-20 DIAGNOSIS — M545 Low back pain, unspecified: Secondary | ICD-10-CM

## 2013-09-20 DIAGNOSIS — K5730 Diverticulosis of large intestine without perforation or abscess without bleeding: Secondary | ICD-10-CM

## 2013-09-20 DIAGNOSIS — I739 Peripheral vascular disease, unspecified: Secondary | ICD-10-CM

## 2013-09-20 DIAGNOSIS — F411 Generalized anxiety disorder: Secondary | ICD-10-CM

## 2013-09-20 DIAGNOSIS — I2510 Atherosclerotic heart disease of native coronary artery without angina pectoris: Secondary | ICD-10-CM

## 2013-09-20 DIAGNOSIS — E079 Disorder of thyroid, unspecified: Secondary | ICD-10-CM

## 2013-09-20 DIAGNOSIS — E559 Vitamin D deficiency, unspecified: Secondary | ICD-10-CM

## 2013-09-20 DIAGNOSIS — E7800 Pure hypercholesterolemia, unspecified: Secondary | ICD-10-CM

## 2013-09-20 DIAGNOSIS — D126 Benign neoplasm of colon, unspecified: Secondary | ICD-10-CM

## 2013-09-20 DIAGNOSIS — K449 Diaphragmatic hernia without obstruction or gangrene: Secondary | ICD-10-CM

## 2013-09-20 DIAGNOSIS — M1990 Unspecified osteoarthritis, unspecified site: Secondary | ICD-10-CM

## 2013-09-20 LAB — BASIC METABOLIC PANEL
BUN: 32 mg/dL — ABNORMAL HIGH (ref 6–23)
CALCIUM: 9.5 mg/dL (ref 8.4–10.5)
CO2: 28 meq/L (ref 19–32)
CREATININE: 1.7 mg/dL — AB (ref 0.4–1.2)
Chloride: 107 mEq/L (ref 96–112)
GFR: 36.85 mL/min — ABNORMAL LOW (ref >60.00)
GLUCOSE: 87 mg/dL (ref 70–99)
POTASSIUM: 4.8 meq/L (ref 3.5–5.1)
SODIUM: 141 meq/L (ref 135–145)

## 2013-09-20 LAB — CBC WITH DIFFERENTIAL/PLATELET
BASOS ABS: 0 10*3/uL (ref 0.0–0.1)
BASOS PCT: 0.2 % (ref 0.0–3.0)
EOS ABS: 0.1 10*3/uL (ref 0.0–0.7)
Eosinophils Relative: 1.4 % (ref 0.0–5.0)
HCT: 40.9 % (ref 36.0–46.0)
Hemoglobin: 13.2 g/dL (ref 12.0–15.0)
Lymphocytes Relative: 37.5 % (ref 12.0–46.0)
Lymphs Abs: 2.8 10*3/uL (ref 0.7–4.0)
MCHC: 32.3 g/dL (ref 30.0–36.0)
MCV: 78.2 fl (ref 78.0–100.0)
MONO ABS: 0.6 10*3/uL (ref 0.1–1.0)
Monocytes Relative: 8.5 % (ref 3.0–12.0)
NEUTROS ABS: 4 10*3/uL (ref 1.4–7.7)
NEUTROS PCT: 52.4 % (ref 43.0–77.0)
Platelets: 122 10*3/uL — ABNORMAL LOW (ref 150.0–400.0)
RBC: 5.23 Mil/uL — AB (ref 3.87–5.11)
RDW: 16.4 % — AB (ref 11.5–15.5)
WBC: 7.6 10*3/uL (ref 4.0–10.5)

## 2013-09-20 LAB — IBC PANEL
Iron: 73 ug/dL (ref 42–145)
Saturation Ratios: 15 % — ABNORMAL LOW (ref 20.0–50.0)
TRANSFERRIN: 347.7 mg/dL (ref 212.0–360.0)

## 2013-09-20 NOTE — Patient Instructions (Signed)
Today we updated your med list in our EPIC system...    Continue your current medications the same...  Today we did your follow up blood work...    We will contact you w/ the results when available...   Call for any questions...  Let's plan a follow up visit in 90mo, sooner if needed for problems.Marland KitchenMarland Kitchen

## 2013-09-20 NOTE — Progress Notes (Signed)
Subjective:    Patient ID: Virginia Casey, female    DOB: [birth year], 80 y.o.   MRN: [id number]  HPI 80 y/o BF here for a follow up visit... she has mult med problems as noted below...   ~  October 22, 2011:  16mo ROV & Chanon reports that she is stable- had some chest discomfort and went to the ER 7/13> no evid of ischemia & she had f/u [facility] who did a stress test which was neg...     [name] her medical problems & current medications and updated them below >>  CXR 7/13 showed normal heart size, largeHH, bibasilar atx, otherw clear...  EKG 7/13 showed SBrady, rate52, no acute changes...  Myoview 8/13 at [facility] showed no ischemia or infarction, normal wall motion, EF=70%...  LABS 7/13:  Chems- wnl;  CBC- wnl...   ~  June 01, 2012:  86mo ROV & Virginia Casey reports a stable interval w/o new complaints or concerns... [name] the following medical problems during today's office visit >>     AB> doing well, no resp infections or exacerbations, on no regular meds...    HBP> on Losartan100 & off Amlod5; BP= 130/84 & she denies CP, palpit, SOB, edema; rec to continue same meds + diet...    CAD> followed by Virginia Casey; we Casey not have his notes; pt states she is doing fine & denies CP/ angina/ etc; she loves the cardiac rehab program...    PAFlutter> still on Amio per [facility]- $RemoveBef'200mg'rqUVGyXZML$ tabs- taking one daily now; she is off the Eliquis; we Casey not have records & rely on her hx & med list; she denies palpit, dizzy, SOB, etc...    Cerebrovasc dis> on ASA81; she denies cerebral ischemic symptoms...    Chol> on Cres20; FLP shows TChol 128, TG 32, HDL 68, LDL 53- looks great continue same...    Thyroid dis> followed by [name]; she has hx hyperthyroidism secondary to Amio & a prob multinod goiter; treated w/ Tapazole, then PTU but we Casey not have recent notes; she tells me that she is off the PTU & "everything is fine";     GI> HH, Divertics, Polyps> on PepcidBid; she denies abd pain, dysphagia, n/v, c/d, blood  seen...    DJD> on Tylenol & VitD 1000u daily but Vit D level today is still low at 20 & she is asked to incr to 2000u/d...    Anxiety> on Alpraz0.25 prn which really helps...    Anemia> on Fe w/ VitC; Hg=13.1, MCV=78, Fe=66; rec to continue supplements... [name] prob list, meds, xrays and labs> see below for updates >>   LABS 3/14:  FLP- at goals on Cres20;  Chems- wnl;  CBC- wnl;  TSH=2.11;  VitD=12 & Rec to start 50,000u weekly supplement...   ~  September 22, 2012:  51mo ROV & add-on appt at pt request for SOB> Virginia Casey reports that she went to a convention for the blind in [place] (very hot >100 daily) & ret yest noting incr SOB, can't get a DB, ?wheezy, didn't rest well & was anxious (this was her deceased husb's favorite group);  We discussed doing a thorough assessment>>  Exam is clear- no wheezing, rales, or rhonchi heard...  O2 sats were 98% on RA & did not desat w/ ambulation...  CXR 7/14 showed cardiomeg, largeHH, NAD...   PFT's were attempted but pt absolutely could not Casey the simple spirometry...  LABS 7/14 showed Chems- wnl, CBC- ok x plat=113K We decided to treat  w/ a COMBIVENT Respimat to use 1 spray every 6h as needed for wheezing; she will relax & take her ALPRAZOLAM 0.$RemoveBeforeDE'25mg'cLXFjbAJIaaVTdV$  tid & avoid the heat... [name] prob list, meds, xrays and labs> see below for updates >>   ~  December 21, 2012:  88mo ROV & Virginia Casey reports that she had a f/u w/ [facility] 7/14 & after extensive eval w/ abn Myoview, he did a cath w/ in-stent re-stenosis, dilated, & she is improved back to baseline;  She is now in [place] loves it!!!  More recently she had epig pain & went to the ER- known [facility] & felt to have reflux; they changed her Pepcid to Omeprazole20Bid & I have asked her to change the Omep to Protonix40Bid in light of the recent in-stent restenosis & need for ASA81/ Plavix75...  [name] the following medical problems during today's office visit >>     AB> on Combivent prn; doing well, no  resp infections or exacerbations...    HBP> on Losartan100 & Amlod5; BP= 118/88 & she denies CP, palpit, SOB, edema; rec to continue same meds + diet...    CAD> followed by Virginia Casey on ASA81, Plavix75; eval 7/14 w/ abn Myoview & subseq cath 8/14 showed in-stent restenosis in LAD, s/p PCI & now back to baseline...    PAFlutter> still on Amio200 per [facility]; she denies palpit, dizzy, ch in SOB, etc...    Cerebrovasc dis> on ASA81/ Plavix75; she denies cerebral ischemic symptoms...    Chol> on Cres20; FLP 3/14 showed TChol 128, TG 32, HDL 68, LDL 53- we don't have notes/ labs from [facility]....    Thyroid dis> followed by [name]; she has hx hyperthyroidism secondary to Amio & a prob multinod goiter; treated w/ Tapazole, then PTU; she tells me that she is off the PTU & "everything is fine"; Labs 3/14 showed TSH= 2.11...    GI> HH, Divertics, Polyps> prev on PepcidBid; switched to Prilosec20bid after 9/14 ER visit but she is in need of effective Plavix anticoag effect, therefore switched to PANTOPRAZOLE $RemoveBeforeD'40mg'fZHtLQsHtsjLFV$  bid...    DJD> on Tylenol & VitD 50K per week since labs 3/14 showed VitD level lower at 12...    Anxiety> on Alpraz0.25 prn which really helps...    Anemia> on Fe w/ VitC; Hg=12.8, MCV=74, Fe= needs to be repeated; rec to continue supplements... [name] prob list, meds, xrays and labs> see below for updates >>   ~  March 23, 2013:  74mo ROV & Virginia Casey reports doing well, no new complaints or concerns...    Chol> on Cres20; FLP 1/15 showed TChol 133, TG 38, HDL 75, LDL 50- we don't have notes/ labs from [facility]....    Renal insuffic> on Amlod5, Losar100, Amio200; Labs 1/15 showed BUN=42, Cr=1.8.Virginia KitchenMarland Casey She knows to avoid NSAIDs & incr fluids...    Anemia> Hg=12.3, MCV=78; needs to restart Fe daily & stay on this... [name] prob list, meds, xrays and labs> see below for updates >>   LABS 1/15:  FLP- at goals on Cres20;  Chems- ok x renal insuffic w/ BUN=42, Cr=1.8;  CBC- ok w/ Hg=12.3, MCV=78;   TSH=3.09;  VitD=50... REC to continue Cres20, avoid NSAIDs & incr fluids, start FeSO4 daily and continue VitD50K weekly...   ~  September 20, 2013:  63mo ROV & Aqsa noted diarrhea x3d last week for which she took some Pepto & now much improved... She reports doing well overall at age 38 w/o other complaints or concerns...  She's been doing  Cardiac rehab & really likes it...    She continues regular f/u w/ Cards- DrHarwani on Amlod5, Losar100, Amio200, ASA.Plavix; BP= 130/80 & she denies CP, palpit, SOB, edema, or cerebral ischemic symptoms...    She remains on Cres20 & FLP 1/15 looked great...     She is clinically & biochem euthyroid w/ TSH 1/15 = 3.09...    She has mild renal insuffic w/ Labs 7/15 showing BUN= 32, Cr= 1.7 which is stable since last Sep    Hx Anemia on Fe daily> Labs 7/15 showed Hg=13.2 (up 1pt), MCV=78, Fe=73 (15%sat) and rec to continue same meds & supplements... [name] prob list, meds, xrays and labs> see below for updates >>  REC to continue same meds and supplements including the Fe...          Problem List:  Hearing Loss >> she saw DrCrossley 3/13 w/ a high freq hearing loss & he removed creumen impactions' they are holding off on hearing aides for now...  ASTHMATIC BRONCHITIS (ICD-466.0) - breathing is stable now- s/p recent exacerbation... ~  CXR 1/12 showed basilar atx & large HH, cardiomeg, osteopenia, NAD... ~  [facility] 5/12 w/ refractory AB exac & improved w/ standard therapy & brief course of Pred; CXR- similar, NAD.Virginia Casey. ~  CXR 7/13 showed norm heart size, large HH, clear lungs, NAD.Virginia Casey. ~  7/14:  She presented w/ a mild exac brought on by the stress of a trip to [place] etc=> Rx w/ COMBIVENT every 6h as needed... ~  CXR 7/14 showed cardiomeg, largeHH, NAD... We attempted PFTs but she was unable to perform... Os sats were 98% on RA...  HYPERTENSION (ICD-401.9) - now on LOSARTAN $RemoveBef'100mg'CvMEkZfQWM$ /d & AMLODIPINE $RemoveBefo'5mg'ecsUJPKXOIy$ /d (and off her prev Ramipril due to asthma & Imdur due to  HA) ...  ~  8/12:  BP= 110/72 & not checking BP's at home; she denies visual changes, CP, palipit, syncope, dyspnea, edema, etc... ~  2/13:  BP= 122/82 & she is stable, mostly asymptomatic w/o CP, palpit, SOB, etc... ~  8/13:  BP= 124/86 & she remains stable... ~  3/14: on Losartan100 & off Amlod5; BP= 130/84 & she denies CP, palpit, SOB, edema; rec to continue same meds + diet. ~  7/14: on Losartan100 & off Amlod5; BP= 132/92 & she denies CP, palpit, or edema... ~  10/14: now on Losartan100 + Amlodipine5; BP= 118/88 & she remains asymptomatic... ~  7/15: on Amlod5, Losar100, Amio200, ASA.Plavix; BP= 130/80 & she denies CP, palpit, SOB, edema, or cerebral ischemic symptoms.  ATHEROSCLEROTIC HEART DISEASE (ICD-414.00) & PERIPHERAL VASCULAR DISEASE (ICD-443.9) - regularly on ASA $Remo'81mg'houUb$ /d, & off Plavix... followed by [facility] for Cards (we Casey not have his notes to review med changes)>> ~  hosp in Jan08 w/ syncope and MRI Brain showed mild atrophy & sm vessel dis;  MRA showed basilar art narrowing & mod stenosis in RICA cavernous segment... CDopplers however were WNL w/ antegrade vertebral flow, and no signif ICA stenoses detected... ~  hosp Feb10 by [facility] w/ non-STEMI- s/p PTCA and stent in the LAD. ~  hosp 7/11 by [facility] w/ CP, Abn Myoview & cath w/ 70% midLAD in-stent resteosis; s/p PTCA & started back on Plavix... ~  she is in Cardiac Rehab & really likes it... ~  7/13:  She went to the ER w/ chest discomfort; ruled out for ischemia (EKG= SBrday, rate52, wnl/NAD) & followed up w/ DrHarwani> he did MYOVIEW 8/13 which was neg for ischemia or infarction, norm  wall motion & EF=70%... ~  7/14:  She continues to f/u w/ [facility] her cardiologist;  She will see him soon to f/u from this [place] trip for additional test => Myoview was Abn & Cath 8/14 w/ in-stent restenosis, s/p PCI... ~  10/14: now on ASA81 + Plavix75 and enrolled in cardiac rehab which she loves... (Note: Omep20Bid give via ER 9/14 is  being changed to Protonix40Bid due to her Plavix Rx)... ~  She continues to f/u regularly w/ DrHarwani...  ATRIAL FLUTTER, PAROXYSMAL (ICD-427.32) - this occured 2/10 following her cath, PTCA, stent... he diagnosed flutter w/ 2:1 block... currently taking AMIODARONE $RemoveBeforeDEI'200mg'zNGeztQXHeGtMSJm$ /d...  HYPERCHOLESTEROLEMIA (ICD-272.0) - on CRESTOR $RemoveBe'20mg'sCZQbdaso$ /d... ~  [place] 11/08 showed TChol 161, TG 39, HDL 62, LDL 92... continue same med. ~  [place] 5/09 showed TChol 209, TG 70, HDL 74, LDL 124... rec- better diet, get weight down! ~  FLP in hosp 2/10 showed TChol 130, TG 69, HDL 45, LDL 71 ~  she reports that [facility] does fasting labs... ~  FLP 7/11 in hosp showed TChol 105, TG 30, HDL 66, LDL 33 ~  FLP 2/12 on Cres20 showed TChol 97, TG 38, HDL 50, LDL 39... copy to Cards ?decr to $Remove'10mg'rfocEeE$ ? ~  FLP 2/13 on Cres20 showed TChol 118, TG 42, HDL 60, LDL 50... Continue same. ~  [place] 3/14 on Cres20 showed TChol 128, TG 32, HDL 68, LDL 53  ~  FLP 1/15 on Cres20 showed TChol 133, TG 38, HDL 75, LDL 50  THYROID DISEASE >> Hx Amiodarone induced hyperthyroidism diagnosed 2/12 & referred to [facility], treated w/ PTU (now off) & DrHarwani tapered the Amio... ~  1/13:  She had f/u DrKumar> everything improve4d w/ reduction in Amio dose; clinically euthyroid & no goiter palpated; TFT's in his office were WNL==> TSH=3.74,  FreeT3=2.4 (2.0-4.4), FreeT4=0.91 (0.61-1.12) ~  Labs 3/14 showed TSH= 2.11 ~  Labs 1/15 showed TSH= 3.09  HIATAL HERNIA (ICD-553.3) - prev on Nexium & Carafate Liq- but changed to PEPCID $RemoveB'20mg'jBAInkQE$ Bid by Virginia Casey due to need for Plavix... she has a giant HH measuring 10cm per DrSam, w/ erosions in the pouch... last EGD by DrSam 4/08... hernia is seen on her CXRs. ~  9/14: she went to the Er w/ Epig pain & they felt it was reflux related due to her [place]; they switched Pepcid to Omep20Bid but she will need to change this to Ernest due to the Plavix...  DIVERTICULOSIS OF COLON (ICD-562.10) & COLONIC POLYPS (ICD-211.3) ~   colonoscopy 11/06 showed divertics and 2- 63mm polyps... f/u planned in 4-10yrs... ~  Hosp 11/10 w/ lower GIB believed due to divertics... colonoscopy by DrJacobs showed severe divertics, no bleeding site, 71mm polyp removed...  RENAL INSUFFICIENCY >> she knows to avoid NSAIDs and incr fluid intake... ~  Labs 1/15 showed BUN= 42, Cr= 1.8 ~  Labs 7/15 showed BUN= 32, Cr= 1.7  DEGENERATIVE JOINT DISEASE (ICD-715.90) & HIP PAIN, RIGHT (ICD-719.45) - currently taking OTC meds only but DrDean rec Vicodin for as needed use (she prefers Tylenol)... Eval by DrDean 3/09 w/ bone-on-bone right hip, tried injection & pain meds... right THR done 12/09...  VITAMIN D DEFICIENCY (ICD-268.9) ~  labs 5/09 showed Vit D level = 9... pt started on 50K weekly but she switched on her own to 1000u/d OTC. ~  labs 2/12 showed Vit D level = 18... asked to incr Vit D supplement to 2000 u daily (she never did). ~  Labs 2/13 showed Vit D level =  20... Asked to finally incr VitD supplement to 2000u daily... ~  Labs 3/14 showed Vit D = 12 & Rec to switch to 50K weekly Rx... ~  Labs 1/15 showed VitD = 50 & rec to continue VitD 50K weekly...  ANXIETY (ICD-300.00) - she is requesting a mild nerve pill & we have written ALPRAZOLAM 0.25mg  Prn use...  ANEMIA (ICD-285.9) - prev on Niferex 150mg /d, now FeSO4 325mg /d w/ Vit C 500mg ... 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 ~  last blood count 11/08 showed Hg=13.7, Fe=56... ~  labs 5/09 showed Hg= 14.2.Virginia Casey. ~  labs 2/10 in hosp showed Hg= 13 ~  Hosp 11/10 w/ lower GIB, & Hg down to 8.6- tc 1u PC up to 9.3.Virginia KitchenMarland Casey ~  labs 12/10 showed Hg= 10.6.Virginia Casey. rec> continue oral Fe w/ VitC... ~  [facility] labs 7/11 showed Hg= 11.6 - 10.5, MCV= 70... rec> restart Fe. ~  labs 2/12 showed Hg= 12.4, MCV= 77, Fe= 56 (sat=10%)... continue Fe w/ VitC. ~  Labs 5/12 in hosp showed Hg= 12.3, MCV= 68, & she continues on FeSO4 daily w/ VitC... ~  Labs 2/13 showed Hg=13.1, MCV=78, Fe=66; rec to continue  supplements... ~  Labs 3/14 showed Hg= 13.1 ~  Labs 9/14 showed Hg= 12.8, MCV=74 ~  Labs 1/15 showed Hg= 12.3, MCV=78 and rec to restart FeSO4 325mg  daily... ~  Labs 7/15 showed Hg= 13.2, MCV=78, Fe=73 (15%sat)... rec to continue supplements.  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   Past Surgical History  Procedure Laterality Date  . Total hip arthroplasty Right 12/09    Dr Marlou Sa  . Coronary angioplasty with stent placement  04/2008    "1; Dr Terrence Dupont" (10/06/2012)  . Coronary angioplasty  10/06/2012  . Dilation and curettage of uterus      Outpatient Encounter Prescriptions as of 09/20/2013  Medication Sig  . acetaminophen (TYLENOL) 325 MG tablet Take 325 mg by mouth every 6 (six) hours as needed. For pain  . ALPRAZolam (XANAX) 0.25 MG tablet take 1 tablet by mouth three times a day if needed  . amiodarone (PACERONE) 200 MG tablet Take 200 mg by mouth daily.   Virginia Casey amLODipine (NORVASC) 5 MG tablet Take 5 mg by mouth daily.  Virginia Casey aspirin 81 MG tablet Take 81 mg by mouth daily.   . clopidogrel (PLAVIX) 75 MG tablet Take 1 tablet (75 mg total) by mouth daily with breakfast.  . ferrous gluconate (FERGON) 325 MG tablet Take 325 mg by mouth daily with breakfast. With vitamin C tablet   . Ipratropium-Albuterol (COMBIVENT RESPIMAT) 20-100 MCG/ACT AERS respimat Inhale 1 puff into the lungs every 6 (six) hours.  Virginia Casey losartan (COZAAR) 100 MG tablet take 1 tablet by mouth once daily  . nitroGLYCERIN (NITROSTAT) 0.4 MG SL tablet Place 0.4 mg under the tongue every 5 (five) minutes as needed. May repeat x3   . pantoprazole (PROTONIX) 40 MG tablet Take 1 tablet (40 mg total) by mouth 2 (two) times daily.  . rosuvastatin (CRESTOR) 20 MG tablet Take 20 mg by mouth daily.   . vitamin C (ASCORBIC ACID) 500 MG tablet Take 500 mg by mouth daily. With iron tablet   . Vitamin D, Ergocalciferol, (DRISDOL) 50000 UNITS CAPS capsule take 1 capsule by  mouth every week    No Known Allergies   Current Medications, Allergies, Past Medical History, Past Surgical History, Family History, and Social History  were reviewed in [place] record.   Review of Systems         See HPI - all other systems neg except as noted... The patient complains of dyspnea on exertion.  The patient denies anorexia, fever, weight loss, weight gain, vision loss, decreased hearing, hoarseness, chest pain, syncope, peripheral edema, prolonged cough, headaches, hemoptysis, abdominal pain, melena, hematochezia, severe indigestion/heartburn, hematuria, incontinence, muscle weakness, suspicious skin lesions, transient blindness, difficulty walking, depression, unusual weight change, abnormal bleeding, enlarged lymph nodes, and angioedema.     Objective:   Physical Exam     WD, WN, 80 y/o BF in NAD... GENERAL:  Alert & oriented; pleasant & cooperative... HEENT:  [place]/AT, EOM-wnl, EACs-clear, TMs-wnl, NOSE-clear, THROAT-clear & wnl. NECK:  Supple w/ fairROM; no JVD; normal carotid impulses w/o bruits; no thyromegaly or nodules palpated; no lymphadenopathy. CHEST:  Coarse BS w/ no wheezing, no rales, no signs of consolidation. HEART:  Regular Rhythm; gr 1/6 SEM without rubs or gallops detected... ABDOMEN:  Soft & nontender; normal bowel sounds; no organomegaly or masses palpated... EXT: without deformities, mod arthritic changes; s/p right THR; no varicose veins/ +venous insuffic/  tr edema . BACK:  sl tender along right PSIS area... NEURO:  CN's intact; no focal neuro defict... DERM:  no lesions seen...  RADIOLOGY DATA:  Reviewed in the EPIC EMR & discussed w/ the patient...  LABORATORY DATA:  Reviewed in the EPIC EMR & discussed w/ the patient...     Assessment & Plan:    AB>  Improved overall & she had stopped all meds on her own=> using the COMBIVENT respimat prn...  HBP>  Fair control on the Losartan & Amlodipine; asked to monitor BP  at home...  ASHD>  Followed by Virginia Casey for Cards> cath 8/14 w/ LAD instent restenosis, s/p successful PCI & she is back to baseline; Note: ER 9/14 switched her Pepcid to Omep20Bid but her Plavix is critically important to help patency of the LAD stent; therefore we will switch the Omep to PANTOPRAZOLE40Bid...  AFlutter>  DrHarwani has her on Amio $Remov'200mg'QkYdul$  daily; her prev thyroid problems have resolved...  CHOL>  On Cres20 w/ good control of FLP...  THYROID DISEASE>  Prev Amio induced hyperthyroidism managed by DrKumar (on PTU prev) & resolved on lower dose of Amio...  GI>  Hx HH/ GERD/ Divertics/ Polyps>  She is followed by DrJacobs for GI, we will switch her PPI to PROTONIX40Bid due to her needed Plavix rx...  GU> mild renal insuffic is stable w/ Cr=1.7-1.8 on current meds...  DJD>  She is followed by DrDean for Ortho...  Anemia>  Hg is OK but MCV is low, Fe=73 (improved) & she will continue her Fe & VitC supplements...  Anxiety>  Requesting a mild nerve pill & we wrote for Alprazolam...   Patient's Medications  New Prescriptions   No medications on file  Previous Medications   ACETAMINOPHEN (TYLENOL) 325 MG TABLET    Take 325 mg by mouth every 6 (six) hours as needed. For pain   ALPRAZOLAM (XANAX) 0.25 MG TABLET    take 1 tablet by mouth three times a day if needed   AMIODARONE (PACERONE) 200 MG TABLET    Take 200 mg by mouth daily.    AMLODIPINE (NORVASC) 5 MG TABLET    Take 5 mg by mouth daily.   ASPIRIN 81 MG TABLET    Take 81 mg by mouth daily.    CLOPIDOGREL (PLAVIX) 75 MG TABLET  Take 1 tablet (75 mg total) by mouth daily with breakfast.   FERROUS GLUCONATE (FERGON) 325 MG TABLET    Take 325 mg by mouth daily with breakfast. With vitamin C tablet    IPRATROPIUM-ALBUTEROL (COMBIVENT RESPIMAT) 20-100 MCG/ACT AERS RESPIMAT    Inhale 1 puff into the lungs every 6 (six) hours.   LOSARTAN (COZAAR) 100 MG TABLET    take 1 tablet by mouth once daily   NITROGLYCERIN (NITROSTAT) 0.4  MG SL TABLET    Place 0.4 mg under the tongue every 5 (five) minutes as needed. May repeat x3    PANTOPRAZOLE (PROTONIX) 40 MG TABLET    Take 1 tablet (40 mg total) by mouth 2 (two) times daily.   ROSUVASTATIN (CRESTOR) 20 MG TABLET    Take 20 mg by mouth daily.    VITAMIN C (ASCORBIC ACID) 500 MG TABLET    Take 500 mg by mouth daily. With iron tablet    VITAMIN D, ERGOCALCIFEROL, (DRISDOL) 50000 UNITS CAPS CAPSULE    take 1 capsule by mouth every week  Modified Medications   No medications on file  Discontinued Medications   No medications on file

## 2013-09-22 ENCOUNTER — Encounter (HOSPITAL_COMMUNITY): Payer: Medicare Other

## 2013-09-23 ENCOUNTER — Telehealth: Payer: Self-pay | Admitting: Pulmonary Disease

## 2013-09-23 NOTE — Telephone Encounter (Signed)
Please notify patient> Labs are stable to improved...  Chems show Cr=1.7 stable & rec to continue same meds & drink fluids...  CBC is improved w/ Hg up to 13.2 & Iron sl improved; rec to continue iron supplement... ---   I spoke with patient about results and she verbalized understanding and had no questions

## 2013-09-23 NOTE — Progress Notes (Signed)
Quick Note:  lmtcb x 2 ______

## 2013-09-24 ENCOUNTER — Encounter (HOSPITAL_COMMUNITY)
Admission: RE | Admit: 2013-09-24 | Discharge: 2013-09-24 | Disposition: A | Discharge status: Home / Self Care | Payer: Self-pay | Source: Ambulatory Visit | Attending: Cardiology | Admitting: Cardiology

## 2013-09-27 ENCOUNTER — Encounter (HOSPITAL_COMMUNITY)
Admission: RE | Admit: 2013-09-27 | Discharge: 2013-09-27 | Disposition: A | Discharge status: Home / Self Care | Payer: Self-pay | Source: Ambulatory Visit | Attending: Cardiology | Admitting: Cardiology

## 2013-09-29 ENCOUNTER — Encounter (HOSPITAL_COMMUNITY)
Admission: RE | Admit: 2013-09-29 | Discharge: 2013-09-29 | Disposition: A | Discharge status: Home / Self Care | Payer: Self-pay | Source: Ambulatory Visit | Attending: Cardiology | Admitting: Cardiology

## 2013-10-01 ENCOUNTER — Encounter (HOSPITAL_COMMUNITY): Payer: Medicare Other

## 2013-10-04 ENCOUNTER — Encounter (HOSPITAL_COMMUNITY)
Admission: RE | Admit: 2013-10-04 | Discharge: 2013-10-04 | Disposition: A | Discharge status: Home / Self Care | Payer: Self-pay | Source: Ambulatory Visit | Attending: Cardiology | Admitting: Cardiology

## 2013-10-04 DIAGNOSIS — Z5189 Encounter for other specified aftercare: Secondary | ICD-10-CM | POA: Insufficient documentation

## 2013-10-04 DIAGNOSIS — I2510 Atherosclerotic heart disease of native coronary artery without angina pectoris: Secondary | ICD-10-CM | POA: Insufficient documentation

## 2013-10-04 DIAGNOSIS — I4891 Unspecified atrial fibrillation: Secondary | ICD-10-CM | POA: Insufficient documentation

## 2013-10-04 DIAGNOSIS — Z9861 Coronary angioplasty status: Secondary | ICD-10-CM | POA: Insufficient documentation

## 2013-10-06 ENCOUNTER — Encounter (HOSPITAL_COMMUNITY): Payer: Medicare Other

## 2013-10-08 ENCOUNTER — Encounter (HOSPITAL_COMMUNITY)
Admission: RE | Admit: 2013-10-08 | Discharge: 2013-10-08 | Disposition: A | Discharge status: Home / Self Care | Payer: Self-pay | Source: Ambulatory Visit | Attending: Cardiology | Admitting: Cardiology

## 2013-10-11 ENCOUNTER — Encounter (HOSPITAL_COMMUNITY)
Admission: RE | Admit: 2013-10-11 | Discharge: 2013-10-11 | Disposition: A | Discharge status: Home / Self Care | Payer: Self-pay | Source: Ambulatory Visit | Attending: Cardiology | Admitting: Cardiology

## 2013-10-13 ENCOUNTER — Encounter (HOSPITAL_COMMUNITY): Payer: Medicare Other

## 2013-10-15 ENCOUNTER — Encounter (HOSPITAL_COMMUNITY): Payer: Medicare Other

## 2013-10-18 ENCOUNTER — Encounter (HOSPITAL_COMMUNITY)
Admission: RE | Admit: 2013-10-18 | Discharge: 2013-10-18 | Disposition: A | Discharge status: Home / Self Care | Payer: Self-pay | Source: Ambulatory Visit | Attending: Cardiology | Admitting: Cardiology

## 2013-10-20 ENCOUNTER — Encounter (HOSPITAL_COMMUNITY): Payer: Medicare Other

## 2013-10-22 ENCOUNTER — Encounter (HOSPITAL_COMMUNITY): Payer: Medicare Other

## 2013-10-22 ENCOUNTER — Telehealth: Payer: Self-pay | Admitting: Pulmonary Disease

## 2013-10-22 MED ORDER — LOSARTAN POTASSIUM 100 MG PO TABS: ORAL_TABLET | ORAL | Status: DC

## 2013-10-22 NOTE — Telephone Encounter (Signed)
rx sent in. Nothing further needed 

## 2013-10-25 ENCOUNTER — Encounter (HOSPITAL_COMMUNITY): Payer: Medicare Other

## 2013-10-27 ENCOUNTER — Encounter (HOSPITAL_COMMUNITY): Payer: Medicare Other

## 2013-10-29 ENCOUNTER — Encounter (HOSPITAL_COMMUNITY): Payer: Medicare Other

## 2013-10-29 ENCOUNTER — Telehealth: Payer: Self-pay | Admitting: Pulmonary Disease

## 2013-10-29 MED ORDER — LOSARTAN POTASSIUM 100 MG PO TABS: ORAL_TABLET | ORAL | Status: DC

## 2013-10-29 NOTE — Telephone Encounter (Signed)
Called and spoke with shane from [facility] and he was given verbal over the phone to give the pt the losartan 100 mg 1 daily  #90 with 1 refill.  This has been updated in the pts chart and nothing further is needed.

## 2013-11-01 ENCOUNTER — Encounter (HOSPITAL_COMMUNITY): Payer: Medicare Other

## 2013-11-03 ENCOUNTER — Encounter (HOSPITAL_COMMUNITY): Payer: Self-pay

## 2013-11-03 DIAGNOSIS — Z9861 Coronary angioplasty status: Secondary | ICD-10-CM | POA: Insufficient documentation

## 2013-11-03 DIAGNOSIS — I252 Old myocardial infarction: Secondary | ICD-10-CM | POA: Insufficient documentation

## 2013-11-03 DIAGNOSIS — I2510 Atherosclerotic heart disease of native coronary artery without angina pectoris: Secondary | ICD-10-CM | POA: Insufficient documentation

## 2013-11-05 ENCOUNTER — Encounter (HOSPITAL_COMMUNITY): Payer: Self-pay

## 2013-11-10 ENCOUNTER — Encounter (HOSPITAL_COMMUNITY): Payer: Self-pay

## 2013-11-12 ENCOUNTER — Encounter (HOSPITAL_COMMUNITY): Payer: Self-pay

## 2013-11-15 ENCOUNTER — Encounter (HOSPITAL_COMMUNITY): Payer: Self-pay

## 2013-11-17 ENCOUNTER — Encounter (HOSPITAL_COMMUNITY): Payer: Self-pay

## 2013-11-19 ENCOUNTER — Encounter (HOSPITAL_COMMUNITY): Payer: Self-pay

## 2013-11-22 ENCOUNTER — Encounter (HOSPITAL_COMMUNITY): Payer: Self-pay

## 2013-11-24 ENCOUNTER — Encounter (HOSPITAL_COMMUNITY)
Admission: RE | Admit: 2013-11-24 | Discharge: 2013-11-24 | Disposition: A | Discharge status: Home / Self Care | Payer: Self-pay | Source: Ambulatory Visit | Attending: Cardiology | Admitting: Cardiology

## 2013-11-26 ENCOUNTER — Encounter (HOSPITAL_COMMUNITY): Payer: Self-pay

## 2013-11-29 ENCOUNTER — Encounter (HOSPITAL_COMMUNITY): Payer: Self-pay

## 2013-12-01 ENCOUNTER — Encounter (HOSPITAL_COMMUNITY): Payer: Self-pay

## 2013-12-03 ENCOUNTER — Encounter (HOSPITAL_COMMUNITY): Payer: Medicare Other

## 2013-12-03 DIAGNOSIS — I2510 Atherosclerotic heart disease of native coronary artery without angina pectoris: Secondary | ICD-10-CM | POA: Insufficient documentation

## 2013-12-03 DIAGNOSIS — I252 Old myocardial infarction: Secondary | ICD-10-CM | POA: Insufficient documentation

## 2013-12-03 DIAGNOSIS — I4891 Unspecified atrial fibrillation: Secondary | ICD-10-CM | POA: Insufficient documentation

## 2013-12-03 DIAGNOSIS — Z9861 Coronary angioplasty status: Secondary | ICD-10-CM | POA: Insufficient documentation

## 2013-12-03 DIAGNOSIS — Z5189 Encounter for other specified aftercare: Secondary | ICD-10-CM | POA: Insufficient documentation

## 2013-12-06 ENCOUNTER — Encounter (HOSPITAL_COMMUNITY)
Admission: RE | Admit: 2013-12-06 | Discharge: 2013-12-06 | Disposition: A | Discharge status: Home / Self Care | Payer: Self-pay | Source: Ambulatory Visit | Attending: Cardiology | Admitting: Cardiology

## 2013-12-08 ENCOUNTER — Encounter (HOSPITAL_COMMUNITY)
Admission: RE | Admit: 2013-12-08 | Discharge: 2013-12-08 | Disposition: A | Discharge status: Home / Self Care | Payer: Self-pay | Source: Ambulatory Visit | Attending: Cardiology | Admitting: Cardiology

## 2013-12-10 ENCOUNTER — Encounter (HOSPITAL_COMMUNITY): Payer: Self-pay

## 2013-12-13 ENCOUNTER — Encounter (HOSPITAL_COMMUNITY)
Admission: RE | Admit: 2013-12-13 | Discharge: 2013-12-13 | Disposition: A | Discharge status: Home / Self Care | Payer: Self-pay | Source: Ambulatory Visit | Attending: Cardiology | Admitting: Cardiology

## 2013-12-15 ENCOUNTER — Encounter (HOSPITAL_COMMUNITY)
Admission: RE | Admit: 2013-12-15 | Discharge: 2013-12-15 | Disposition: A | Discharge status: Home / Self Care | Payer: Self-pay | Source: Ambulatory Visit | Attending: Cardiology | Admitting: Cardiology

## 2013-12-17 ENCOUNTER — Encounter (HOSPITAL_COMMUNITY)
Admission: RE | Admit: 2013-12-17 | Discharge: 2013-12-17 | Disposition: A | Discharge status: Home / Self Care | Payer: Self-pay | Source: Ambulatory Visit | Attending: Cardiology | Admitting: Cardiology

## 2013-12-20 ENCOUNTER — Encounter (HOSPITAL_COMMUNITY)
Admission: RE | Admit: 2013-12-20 | Discharge: 2013-12-20 | Disposition: A | Discharge status: Home / Self Care | Payer: Self-pay | Source: Ambulatory Visit | Attending: Cardiology | Admitting: Cardiology

## 2013-12-22 ENCOUNTER — Encounter (HOSPITAL_COMMUNITY): Payer: Self-pay

## 2013-12-24 ENCOUNTER — Encounter (HOSPITAL_COMMUNITY): Payer: Self-pay

## 2013-12-27 ENCOUNTER — Encounter (HOSPITAL_COMMUNITY)
Admission: RE | Admit: 2013-12-27 | Discharge: 2013-12-27 | Disposition: A | Discharge status: Home / Self Care | Payer: Self-pay | Source: Ambulatory Visit | Attending: Cardiology | Admitting: Cardiology

## 2013-12-29 ENCOUNTER — Encounter (HOSPITAL_COMMUNITY): Payer: Self-pay

## 2013-12-31 ENCOUNTER — Encounter (HOSPITAL_COMMUNITY)
Admission: RE | Admit: 2013-12-31 | Discharge: 2013-12-31 | Disposition: A | Discharge status: Home / Self Care | Payer: Self-pay | Source: Ambulatory Visit | Attending: Cardiology | Admitting: Cardiology

## 2014-01-03 ENCOUNTER — Encounter (HOSPITAL_COMMUNITY): Payer: Self-pay

## 2014-01-03 DIAGNOSIS — I2510 Atherosclerotic heart disease of native coronary artery without angina pectoris: Secondary | ICD-10-CM | POA: Insufficient documentation

## 2014-01-03 DIAGNOSIS — Z5189 Encounter for other specified aftercare: Secondary | ICD-10-CM | POA: Insufficient documentation

## 2014-01-03 DIAGNOSIS — Z9861 Coronary angioplasty status: Secondary | ICD-10-CM | POA: Insufficient documentation

## 2014-01-03 DIAGNOSIS — I252 Old myocardial infarction: Secondary | ICD-10-CM | POA: Insufficient documentation

## 2014-01-03 DIAGNOSIS — I4891 Unspecified atrial fibrillation: Secondary | ICD-10-CM | POA: Insufficient documentation

## 2014-01-05 ENCOUNTER — Encounter (HOSPITAL_COMMUNITY): Payer: Self-pay

## 2014-01-07 ENCOUNTER — Encounter (HOSPITAL_COMMUNITY)
Admission: RE | Admit: 2014-01-07 | Discharge: 2014-01-07 | Disposition: A | Discharge status: Home / Self Care | Payer: Self-pay | Source: Ambulatory Visit | Attending: Cardiology | Admitting: Cardiology

## 2014-01-10 ENCOUNTER — Encounter (HOSPITAL_COMMUNITY): Payer: Self-pay

## 2014-01-11 ENCOUNTER — Other Ambulatory Visit: Payer: Self-pay | Admitting: Pulmonary Disease

## 2014-01-12 ENCOUNTER — Encounter (HOSPITAL_COMMUNITY)
Admission: RE | Admit: 2014-01-12 | Discharge: 2014-01-12 | Disposition: A | Discharge status: Home / Self Care | Payer: Self-pay | Source: Ambulatory Visit | Attending: Cardiology | Admitting: Cardiology

## 2014-01-14 ENCOUNTER — Encounter (HOSPITAL_COMMUNITY)
Admission: RE | Admit: 2014-01-14 | Discharge: 2014-01-14 | Disposition: A | Discharge status: Home / Self Care | Payer: Self-pay | Source: Ambulatory Visit | Attending: Cardiology | Admitting: Cardiology

## 2014-01-17 ENCOUNTER — Encounter (HOSPITAL_COMMUNITY)
Admission: RE | Admit: 2014-01-17 | Discharge: 2014-01-17 | Disposition: A | Discharge status: Home / Self Care | Payer: Self-pay | Source: Ambulatory Visit | Attending: Cardiology | Admitting: Cardiology

## 2014-01-19 ENCOUNTER — Encounter (HOSPITAL_COMMUNITY): Payer: Self-pay

## 2014-01-21 ENCOUNTER — Encounter (HOSPITAL_COMMUNITY): Payer: Self-pay

## 2014-01-24 ENCOUNTER — Encounter (HOSPITAL_COMMUNITY): Payer: Self-pay

## 2014-01-26 ENCOUNTER — Encounter (HOSPITAL_COMMUNITY): Payer: Self-pay

## 2014-01-31 ENCOUNTER — Encounter (HOSPITAL_COMMUNITY): Payer: Self-pay

## 2014-02-02 ENCOUNTER — Encounter (HOSPITAL_COMMUNITY): Payer: Medicare Other

## 2014-02-02 DIAGNOSIS — I2510 Atherosclerotic heart disease of native coronary artery without angina pectoris: Secondary | ICD-10-CM | POA: Insufficient documentation

## 2014-02-02 DIAGNOSIS — I252 Old myocardial infarction: Secondary | ICD-10-CM | POA: Insufficient documentation

## 2014-02-02 DIAGNOSIS — Z9861 Coronary angioplasty status: Secondary | ICD-10-CM | POA: Insufficient documentation

## 2014-02-02 DIAGNOSIS — Z5189 Encounter for other specified aftercare: Secondary | ICD-10-CM | POA: Insufficient documentation

## 2014-02-02 DIAGNOSIS — I4891 Unspecified atrial fibrillation: Secondary | ICD-10-CM | POA: Insufficient documentation

## 2014-02-04 ENCOUNTER — Encounter (HOSPITAL_COMMUNITY)
Admission: RE | Admit: 2014-02-04 | Discharge: 2014-02-04 | Disposition: A | Discharge status: Home / Self Care | Payer: Self-pay | Source: Ambulatory Visit | Attending: Cardiology | Admitting: Cardiology

## 2014-02-07 ENCOUNTER — Encounter (HOSPITAL_COMMUNITY): Payer: Self-pay

## 2014-02-09 ENCOUNTER — Encounter (HOSPITAL_COMMUNITY)
Admission: RE | Admit: 2014-02-09 | Discharge: 2014-02-09 | Disposition: A | Discharge status: Home / Self Care | Payer: Self-pay | Source: Ambulatory Visit | Attending: Cardiology | Admitting: Cardiology

## 2014-02-10 ENCOUNTER — Encounter (HOSPITAL_COMMUNITY): Payer: Self-pay | Admitting: Cardiology

## 2014-02-11 ENCOUNTER — Encounter (HOSPITAL_COMMUNITY)
Admission: RE | Admit: 2014-02-11 | Discharge: 2014-02-11 | Disposition: A | Discharge status: Home / Self Care | Payer: Self-pay | Source: Ambulatory Visit | Attending: Cardiology | Admitting: Cardiology

## 2014-02-14 ENCOUNTER — Encounter (HOSPITAL_COMMUNITY)
Admission: RE | Admit: 2014-02-14 | Discharge: 2014-02-14 | Disposition: A | Discharge status: Home / Self Care | Payer: Self-pay | Source: Ambulatory Visit | Attending: Cardiology | Admitting: Cardiology

## 2014-02-16 ENCOUNTER — Encounter (HOSPITAL_COMMUNITY)
Admission: RE | Admit: 2014-02-16 | Discharge: 2014-02-16 | Disposition: A | Discharge status: Home / Self Care | Payer: Self-pay | Source: Ambulatory Visit | Attending: Cardiology | Admitting: Cardiology

## 2014-02-18 ENCOUNTER — Encounter (HOSPITAL_COMMUNITY)
Admission: RE | Admit: 2014-02-18 | Discharge: 2014-02-18 | Disposition: A | Discharge status: Home / Self Care | Payer: Self-pay | Source: Ambulatory Visit | Attending: Cardiology | Admitting: Cardiology

## 2014-02-21 ENCOUNTER — Encounter (HOSPITAL_COMMUNITY)
Admission: RE | Admit: 2014-02-21 | Discharge: 2014-02-21 | Disposition: A | Discharge status: Home / Self Care | Payer: Self-pay | Source: Ambulatory Visit | Attending: Cardiology | Admitting: Cardiology

## 2014-02-23 ENCOUNTER — Encounter (HOSPITAL_COMMUNITY): Payer: Self-pay

## 2014-02-28 ENCOUNTER — Encounter (HOSPITAL_COMMUNITY)
Admission: RE | Admit: 2014-02-28 | Discharge: 2014-02-28 | Disposition: A | Discharge status: Home / Self Care | Payer: Self-pay | Source: Ambulatory Visit | Attending: Cardiology | Admitting: Cardiology

## 2014-03-02 ENCOUNTER — Encounter (HOSPITAL_COMMUNITY): Payer: Self-pay

## 2014-03-07 ENCOUNTER — Encounter (HOSPITAL_COMMUNITY): Payer: Self-pay

## 2014-03-07 DIAGNOSIS — I2510 Atherosclerotic heart disease of native coronary artery without angina pectoris: Secondary | ICD-10-CM | POA: Insufficient documentation

## 2014-03-07 DIAGNOSIS — Z5189 Encounter for other specified aftercare: Secondary | ICD-10-CM | POA: Insufficient documentation

## 2014-03-07 DIAGNOSIS — Z9861 Coronary angioplasty status: Secondary | ICD-10-CM | POA: Insufficient documentation

## 2014-03-07 DIAGNOSIS — I252 Old myocardial infarction: Secondary | ICD-10-CM | POA: Insufficient documentation

## 2014-03-07 DIAGNOSIS — I4891 Unspecified atrial fibrillation: Secondary | ICD-10-CM | POA: Insufficient documentation

## 2014-03-09 ENCOUNTER — Encounter (HOSPITAL_COMMUNITY): Payer: Self-pay

## 2014-03-11 ENCOUNTER — Encounter (HOSPITAL_COMMUNITY)
Admission: RE | Admit: 2014-03-11 | Discharge: 2014-03-11 | Disposition: A | Discharge status: Home / Self Care | Payer: Self-pay | Source: Ambulatory Visit | Attending: Cardiology | Admitting: Cardiology

## 2014-03-14 ENCOUNTER — Encounter (HOSPITAL_COMMUNITY)
Admission: RE | Admit: 2014-03-14 | Discharge: 2014-03-14 | Disposition: A | Discharge status: Home / Self Care | Payer: Self-pay | Source: Ambulatory Visit | Attending: Cardiology | Admitting: Cardiology

## 2014-03-16 ENCOUNTER — Encounter (HOSPITAL_COMMUNITY)
Admission: RE | Admit: 2014-03-16 | Discharge: 2014-03-16 | Disposition: A | Discharge status: Home / Self Care | Payer: Self-pay | Source: Ambulatory Visit | Attending: Cardiology | Admitting: Cardiology

## 2014-03-18 ENCOUNTER — Encounter (HOSPITAL_COMMUNITY): Payer: Self-pay

## 2014-03-21 ENCOUNTER — Encounter (HOSPITAL_COMMUNITY): Payer: Self-pay

## 2014-03-23 ENCOUNTER — Other Ambulatory Visit (INDEPENDENT_AMBULATORY_CARE_PROVIDER_SITE_OTHER): Payer: Medicare Other

## 2014-03-23 ENCOUNTER — Ambulatory Visit (INDEPENDENT_AMBULATORY_CARE_PROVIDER_SITE_OTHER): Payer: Medicare Other | Admitting: Pulmonary Disease

## 2014-03-23 ENCOUNTER — Encounter: Payer: Self-pay | Admitting: Pulmonary Disease

## 2014-03-23 ENCOUNTER — Encounter (HOSPITAL_COMMUNITY): Payer: Self-pay

## 2014-03-23 VITALS — BP 132/86 | HR 67 | Temp 97.2°F | Ht 61.0 in | Wt 170.2 lb

## 2014-03-23 DIAGNOSIS — M159 Polyosteoarthritis, unspecified: Secondary | ICD-10-CM

## 2014-03-23 DIAGNOSIS — I2510 Atherosclerotic heart disease of native coronary artery without angina pectoris: Secondary | ICD-10-CM

## 2014-03-23 DIAGNOSIS — I10 Essential (primary) hypertension: Secondary | ICD-10-CM | POA: Diagnosis not present

## 2014-03-23 DIAGNOSIS — I4892 Unspecified atrial flutter: Secondary | ICD-10-CM

## 2014-03-23 DIAGNOSIS — E7800 Pure hypercholesterolemia, unspecified: Secondary | ICD-10-CM

## 2014-03-23 DIAGNOSIS — E559 Vitamin D deficiency, unspecified: Secondary | ICD-10-CM

## 2014-03-23 DIAGNOSIS — K5730 Diverticulosis of large intestine without perforation or abscess without bleeding: Secondary | ICD-10-CM

## 2014-03-23 DIAGNOSIS — M545 Low back pain, unspecified: Secondary | ICD-10-CM

## 2014-03-23 DIAGNOSIS — F411 Generalized anxiety disorder: Secondary | ICD-10-CM

## 2014-03-23 DIAGNOSIS — E079 Disorder of thyroid, unspecified: Secondary | ICD-10-CM

## 2014-03-23 DIAGNOSIS — Z23 Encounter for immunization: Secondary | ICD-10-CM

## 2014-03-23 DIAGNOSIS — I739 Peripheral vascular disease, unspecified: Secondary | ICD-10-CM

## 2014-03-23 DIAGNOSIS — M150 Primary generalized (osteo)arthritis: Secondary | ICD-10-CM

## 2014-03-23 DIAGNOSIS — D126 Benign neoplasm of colon, unspecified: Secondary | ICD-10-CM

## 2014-03-23 DIAGNOSIS — K449 Diaphragmatic hernia without obstruction or gangrene: Secondary | ICD-10-CM

## 2014-03-23 LAB — CBC WITH DIFFERENTIAL/PLATELET
Basophils Absolute: 0 10*3/uL (ref 0.0–0.1)
Basophils Relative: 0.5 % (ref 0.0–3.0)
EOS ABS: 0.1 10*3/uL (ref 0.0–0.7)
Eosinophils Relative: 0.8 % (ref 0.0–5.0)
HCT: 40 % (ref 36.0–46.0)
Hemoglobin: 13.2 g/dL (ref 12.0–15.0)
Lymphocytes Relative: 36.9 % (ref 12.0–46.0)
Lymphs Abs: 2.3 10*3/uL (ref 0.7–4.0)
MCHC: 32.9 g/dL (ref 30.0–36.0)
MCV: 76.7 fl — AB (ref 78.0–100.0)
MONO ABS: 0.7 10*3/uL (ref 0.1–1.0)
Monocytes Relative: 10.7 % (ref 3.0–12.0)
NEUTROS PCT: 51.1 % (ref 43.0–77.0)
Neutro Abs: 3.1 10*3/uL (ref 1.4–7.7)
PLATELETS: 112 10*3/uL — AB (ref 150.0–400.0)
RBC: 5.22 Mil/uL — ABNORMAL HIGH (ref 3.87–5.11)
RDW: 15.8 % — ABNORMAL HIGH (ref 11.5–15.5)
WBC: 6.2 10*3/uL (ref 4.0–10.5)

## 2014-03-23 LAB — BASIC METABOLIC PANEL
BUN: 27 mg/dL — AB (ref 6–23)
CO2: 30 mEq/L (ref 19–32)
Calcium: 9.6 mg/dL (ref 8.4–10.5)
Chloride: 106 mEq/L (ref 96–112)
Creatinine, Ser: 1.52 mg/dL — ABNORMAL HIGH (ref 0.40–1.20)
GFR: 41.6 mL/min — AB (ref >60.00)
GLUCOSE: 95 mg/dL (ref 70–99)
POTASSIUM: 4.6 meq/L (ref 3.5–5.1)
Sodium: 141 mEq/L (ref 135–145)

## 2014-03-23 LAB — LIPID PANEL
CHOLESTEROL: 154 mg/dL (ref 0–200)
HDL: 67.7 mg/dL (ref >39.00)
LDL Cholesterol: 74 mg/dL (ref 0–99)
NonHDL: 86.3
TRIGLYCERIDES: 62 mg/dL (ref 0.0–149.0)
Total CHOL/HDL Ratio: 2
VLDL: 12.4 mg/dL (ref 0.0–40.0)

## 2014-03-23 LAB — HEPATIC FUNCTION PANEL
ALBUMIN: 4 g/dL (ref 3.5–5.2)
ALK PHOS: 109 U/L (ref 39–117)
ALT: 18 U/L (ref 0–35)
AST: 22 U/L (ref 0–37)
BILIRUBIN DIRECT: 0.2 mg/dL (ref 0.0–0.3)
Total Bilirubin: 0.9 mg/dL (ref 0.2–1.2)
Total Protein: 7.2 g/dL (ref 6.0–8.3)

## 2014-03-23 LAB — VITAMIN D 25 HYDROXY (VIT D DEFICIENCY, FRACTURES): VITD: 28.09 ng/mL — AB (ref 30.00–100.00)

## 2014-03-23 LAB — TSH: TSH: 2.31 u[IU]/mL (ref 0.35–4.50)

## 2014-03-23 MED ORDER — VITAMIN D (ERGOCALCIFEROL) 1.25 MG (50000 UNIT) PO CAPS: 50000.0000 [IU] | ORAL_CAPSULE | ORAL | Status: DC

## 2014-03-23 MED ORDER — LOSARTAN POTASSIUM 100 MG PO TABS: ORAL_TABLET | ORAL | Status: DC

## 2014-03-23 MED ORDER — ALPRAZOLAM 0.25 MG PO TABS: ORAL_TABLET | ORAL | Status: DC

## 2014-03-23 MED ORDER — FERROUS SULFATE 325 (65 FE) MG PO TABS: 325.0000 mg | ORAL_TABLET | Freq: Every day | ORAL | Status: DC

## 2014-03-23 NOTE — Patient Instructions (Signed)
Today we updated your med list in our EPIC system...    Continue your current medications the same...  We refilled your meds per request...  Today we did your follow up FASTING blood work...    We will contact you w/ the results when available...     We will also send a copy to drHarwani...  Keep up the good work w/ diety & exercise...  Call for any questions...  Let's plan a follow up visit in 73mo, sooner if needed for problems.Marland KitchenMarland Kitchen

## 2014-03-23 NOTE — Progress Notes (Signed)
Subjective:    Patient ID: Virginia Casey, female    DOB: 05/30/1924, 81 y.o.   MRN: [id number]  HPI 81 y/o BF here for a follow up visit... she has mult med problems as noted below...   ~  October 22, 2011:  67mo ROV & Virginia Casey reports that she is stable- had some chest discomfort and went to the ER 7/13> no evid of ischemia & she had f/u [facility] who did a stress test which was neg...     [name] her medical problems & current medications and updated them below >>  CXR 7/13 showed normal heart size, largeHH, bibasilar atx, otherw clear...  EKG 7/13 showed SBrady, rate52, no acute changes...  Myoview 8/13 at [facility] showed no ischemia or infarction, normal wall motion, EF=70%...  LABS 7/13:  Chems- wnl;  CBC- wnl...   ~  June 01, 2012:  29mo ROV & Virginia Casey reports a stable interval w/o new complaints or concerns... [name] the following medical problems during today's office visit >>     AB> doing well, no resp infections or exacerbations, on no regular meds...    HBP> on Losartan100 & off Amlod5; BP= 130/84 & she denies CP, palpit, SOB, edema; rec to continue same meds + diet...    CAD> followed by Virginia Casey; we Casey not have his notes; pt states she is doing fine & denies CP/ angina/ etc; she loves the cardiac rehab program...    PAFlutter> still on Amio per [facility]- 200mg tabs- taking one daily now; she is off the Eliquis; we Casey not have records & rely on her hx & med list; she denies palpit, dizzy, SOB, etc...    Cerebrovasc dis> on ASA81; she denies cerebral ischemic symptoms...    Chol> on Cres20; FLP shows TChol 128, TG 32, HDL 68, LDL 53- looks great continue same...    Thyroid dis> followed by Virginia Casey; she has hx hyperthyroidism secondary to Amio & a prob multinod goiter; treated w/ Tapazole, then PTU but we Casey not have recent notes; she tells me that she is off the PTU & "everything is fine";     GI> HH, Divertics, Polyps> on PepcidBid; she denies abd pain, dysphagia, n/v, c/d, blood  seen...    DJD> on Tylenol & VitD 1000u daily but Vit D level today is still low at 20 & she is asked to incr to 2000u/d...    Anxiety> on Alpraz0.25 prn which really helps...    Anemia> on Fe w/ VitC; Hg=13.1, MCV=78, Fe=66; rec to continue supplements... [name] prob list, meds, xrays and labs> see below for updates >>   LABS 3/14:  FLP- at goals on Cres20;  Chems- wnl;  CBC- wnl;  TSH=2.11;  VitD=12 & Rec to start 50,000u weekly supplement...   ~  September 22, 2012:  37mo ROV & add-on appt at pt request for SOB> Nakai reports that she went to a convention for the blind in [place] (very hot >100 daily) & ret yest noting incr SOB, can't get a DB, ?wheezy, didn't rest well & was anxious (this was her deceased husb's favorite group);  We discussed doing a thorough assessment>>  Exam is clear- no wheezing, rales, or rhonchi heard...  O2 sats were 98% on RA & did not desat w/ ambulation...  CXR 7/14 showed cardiomeg, largeHH, NAD...   PFT's were attempted but pt absolutely could not Casey the simple spirometry...  LABS 7/14 showed Chems- wnl, CBC- ok x plat=113K We decided to treat  w/ a COMBIVENT Respimat to use 1 spray every 6h as needed for wheezing; she will relax & take her ALPRAZOLAM 0.$RemoveBeforeDE'25mg'xPbHdzsebbmWXHb$  tid & avoid the heat... [name] prob list, meds, xrays and labs> see below for updates >>   ~  December 21, 2012:  37mo ROV & Jetty reports that she had a f/u w/ [facility] 7/14 & after extensive eval w/ abn Myoview, he did a cath w/ in-stent re-stenosis, dilated, & she is improved back to baseline;  She is now in [place] loves it!!!  More recently she had epig pain & went to the ER- known [facility] & felt to have reflux; they changed her Pepcid to Omeprazole20Bid & I have asked her to change the Omep to Protonix40Bid in light of the recent in-stent restenosis & need for ASA81/ Plavix75...  [name] the following medical problems during today's office visit >>     AB> on Combivent prn; doing well, no  resp infections or exacerbations...    HBP> on Losartan100 & Amlod5; BP= 118/88 & she denies CP, palpit, SOB, edema; rec to continue same meds + diet...    CAD> followed by Virginia Casey on ASA81, Plavix75; eval 7/14 w/ abn Myoview & subseq cath 8/14 showed in-stent restenosis in LAD, s/p PCI & now back to baseline...    PAFlutter> still on Amio200 per [facility]; she denies palpit, dizzy, ch in SOB, etc...    Cerebrovasc dis> on ASA81/ Plavix75; she denies cerebral ischemic symptoms...    Chol> on Cres20; FLP 3/14 showed TChol 128, TG 32, HDL 68, LDL 53- we don't have notes/ labs from [facility]....    Thyroid dis> followed by Virginia Casey; she has hx hyperthyroidism secondary to Amio & a prob multinod goiter; treated w/ Tapazole, then PTU; she tells me that she is off the PTU & "everything is fine"; Labs 3/14 showed TSH= 2.11...    GI> HH, Divertics, Polyps> prev on PepcidBid; switched to Prilosec20bid after 9/14 ER visit but she is in need of effective Plavix anticoag effect, therefore switched to PANTOPRAZOLE $RemoveBeforeD'40mg'cZKOevwvpXQuUx$  bid...    DJD> on Tylenol & VitD 50K per week since labs 3/14 showed VitD level lower at 12...    Anxiety> on Alpraz0.25 prn which really helps...    Anemia> on Fe w/ VitC; Hg=12.8, MCV=74, Fe= needs to be repeated; rec to continue supplements... [name] prob list, meds, xrays and labs> see below for updates >>   ~  March 23, 2013:  53mo ROV & Virginia Casey reports doing well, no new complaints or concerns...    Chol> on Cres20; FLP 1/15 showed TChol 133, TG 38, HDL 75, LDL 50- we don't have notes/ labs from [facility]....    Renal insuffic> on Amlod5, Losar100, Amio200; Labs 1/15 showed BUN=42, Cr=1.8.Virginia KitchenMarland Casey She knows to avoid NSAIDs & incr fluids...    Anemia> Hg=12.3, MCV=78; needs to restart Fe daily & stay on this... [name] prob list, meds, xrays and labs> see below for updates >>   LABS 1/15:  FLP- at goals on Cres20;  Chems- ok x renal insuffic w/ BUN=42, Cr=1.8;  CBC- ok w/ Hg=12.3, MCV=78;   TSH=3.09;  VitD=50... REC to continue Cres20, avoid NSAIDs & incr fluids, start FeSO4 daily and continue VitD50K weekly...   ~  September 20, 2013:  15mo ROV & Caroline noted diarrhea x3d last week for which she took some Pepto & now much improved... She reports doing well overall at age 63 w/o other complaints or concerns...  She's been doing  Cardiac rehab & really likes it...    She continues regular f/u w/ Cards- DrHarwani on Amlod5, Losar100, Amio200, ASA.Plavix; BP= 130/80 & she denies CP, palpit, SOB, edema, or cerebral ischemic symptoms...    She remains on Cres20 & FLP 1/15 looked great...     She is clinically & biochem euthyroid w/ TSH 1/15 = 3.09...    She has mild renal insuffic w/ Labs 7/15 showing BUN= 32, Cr= 1.7 which is stable since last Sep    Hx Anemia on Fe daily> Labs 7/15 showed Hg=13.2 (up 1pt), MCV=78, Fe=73 (15%sat) and rec to continue same meds & supplements... [name] prob list, meds, xrays and labs> see below for updates >>  REC to continue same meds and supplements including the Fe...  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     AB> on Combivent prn (hasn't needed in a long time); doing well, no resp infections or exacerbations...    HBP> on Losartan100 & Amlod5; BP= 132/86 & she denies CP, palpit, SOB, edema; rec to continue same meds + diet...    CAD> followed by Virginia Casey on ASA81, Plavix75; eval 7/14 w/ abn Myoview & subseq cath 8/14 showed in-stent restenosis in LAD, s/p PCI & back to baseline...    PAFlutter> still on Amio200 per [facility]; she denies palpit, dizzy, ch in SOB, etc...    Cerebrovasc dis> on ASA81/ Plavix75; she denies cerebral ischemic symptoms...    Chol> on Cres20; FLP 1/16 shows TChol 154, TG 62, HDL 68, LDL 74; continue diet & Cres20...    Thyroid dis> followed by Virginia Casey; she has hx  hyperthyroidism secondary to Amio & a prob multinod goiter; treated w/ Tapazole, then PTU; now off PTU & "everything is fine"; Labs 1/16 showed TSH= 2.31...    GI> HH, Divertics, Polyps> on Protonix40Bid to avoid Plavix interaction; doing satis w/o abd pain, n/v, c/d, blood seen...    Renal insuffic> on Amlod5, Losar100, Amio200; Labs 1/16 showed BUN=27, Cr=1.52 (improved)... She knows to avoid NSAIDs & incr fluids...    DJD, VitD defic> on Tylenol & VitD 50K per week since labs 3/14 showed VitD level lower at 12; f/u VitD level 1/16= 28, therefore continue 50K weekly...    Anxiety> on Alpraz0.25 prn which really helps...    Anemia> on Fe w/ VitC; Labs 1/16 showed Hg=13.2, MCV=77, Fe= needs to be repeated; rec to continue supplements daily... [name] prob list, meds, xrays and labs> see below for updates >> Given 2015 Flu vaccine today, OK refills as requested.Virginia Casey  LABS 1/16:  FLP- within parameters on Cres20;  Chems- ok x Cr=1.52;  CBC- ok x MCV=77 & Plat=112;  TSH=2.31;  VitD=28... PLAN>> Continue same meds, refills given per request; rec to continue MVI, FeSO4 daily, & VitD 50K weekly...          Problem List:  Hearing Loss >> she saw DrCrossley 3/13 w/ a high freq hearing loss & he removed creumen impactions' they are holding off on hearing aides for now...  ASTHMATIC BRONCHITIS (ICD-466.0) - breathing is stable now- s/p recent exacerbation... ~  CXR 1/12 showed basilar atx & large HH, cardiomeg, osteopenia, NAD... ~  [facility] 5/12 w/ refractory AB exac & improved w/ standard therapy & brief course of Pred; CXR- similar, NAD.Virginia Casey. ~  CXR 7/13  showed norm heart size, large HH, clear lungs, NAD.Virginia Casey. ~  7/14:  She presented w/ a mild exac brought on by the stress of a trip to [place] etc=> Rx w/ COMBIVENT every 6h as needed... ~  CXR 7/14 showed cardiomeg, largeHH, NAD... We attempted PFTs but she was unable to perform... Os sats were 98% on RA... ~  She remains stable, denies breathing  problems and hasn't needed the Combivent...  HYPERTENSION (ICD-401.9) - now on LOSARTAN $RemoveBef'100mg'ZLdNjzbxdD$ /d & AMLODIPINE $RemoveBefo'5mg'TmovIzEPBqi$ /d (and off her prev Ramipril due to asthma & Imdur due to HA) ...  ~  8/12:  BP= 110/72 & not checking BP's at home; she denies visual changes, CP, palipit, syncope, dyspnea, edema, etc... ~  2/13:  BP= 122/82 & she is stable, mostly asymptomatic w/o CP, palpit, SOB, etc... ~  8/13:  BP= 124/86 & she remains stable... ~  3/14: on Losartan100 & off Amlod5; BP= 130/84 & she denies CP, palpit, SOB, edema; rec to continue same meds + diet. ~  7/14: on Losartan100 & off Amlod5; BP= 132/92 & she denies CP, palpit, or edema... ~  10/14: now on Losartan100 + Amlodipine5; BP= 118/88 & she remains asymptomatic... ~  7/15: on Amlod5, Losar100, Amio200, ASA.Plavix; BP= 130/80 & she denies CP, palpit, SOB, edema, or cerebral ischemic symptoms. ~  1/16: on Losartan100 & Amlod5; BP= 132/86 & she denies CP, palpit, SOB, edema; rec to continue same meds + diet.  ATHEROSCLEROTIC HEART DISEASE (ICD-414.00) & PERIPHERAL VASCULAR DISEASE (ICD-443.9) - regularly on ASA $Remo'81mg'bQFEg$ /d, & off Plavix... followed by [facility] for Cards (we Casey not have his notes to review med changes)>> ~  hosp in Jan08 w/ syncope and MRI Brain showed mild atrophy & sm vessel dis;  MRA showed basilar art narrowing & mod stenosis in RICA cavernous segment... CDopplers however were WNL w/ antegrade vertebral flow, and no signif ICA stenoses detected... ~  hosp Feb10 by [facility] w/ non-STEMI- s/p PTCA and stent in the LAD. ~  hosp 7/11 by [facility] w/ CP, Abn Myoview & cath w/ 70% midLAD in-stent resteosis; s/p PTCA & started back on Plavix... ~  she is in Cardiac Rehab & really likes it... ~  7/13:  She went to the ER w/ chest discomfort; ruled out for ischemia (EKG= SBrday, rate52, wnl/NAD) & followed up w/ DrHarwani> he did MYOVIEW 8/13 which was neg for ischemia or infarction, norm wall motion & EF=70%... ~  7/14:  She continues to f/u  w/ [facility] her cardiologist;  She will see him soon to f/u from this [place] trip for additional test => Myoview was Abn & Cath 8/14 w/ in-stent restenosis, s/p PCI... ~  10/14: now on ASA81 + Plavix75 and enrolled in cardiac rehab which she loves... (Note: Omep20Bid give via ER 9/14 is being changed to Protonix40Bid due to her Plavix Rx)... ~  She continues to f/u regularly w/ DrHarwani (we Casey not have notes from him...  ATRIAL FLUTTER, PAROXYSMAL (ICD-427.32) - this occured 2/10 following her cath, PTCA, stent... he diagnosed flutter w/ 2:1 block... currently taking AMIODARONE $RemoveBeforeDEI'200mg'tQpHHZqzCSahtYAm$ /d... ~  She remains in [facility] & denies CP, palpit, SOB, etc...  HYPERCHOLESTEROLEMIA (ICD-272.0) - on CRESTOR $RemoveBe'20mg'cPxmZIDGP$ /d... ~  [place] 11/08 showed TChol 161, TG 39, HDL 62, LDL 92... continue same med. ~  [place] 5/09 showed TChol 209, TG 70, HDL 74, LDL 124... rec- better diet, get weight down! ~  FLP in hosp 2/10 showed TChol 130, TG 69, HDL 45, LDL 71 ~  she reports that [facility] does fasting labs... ~  FLP 7/11 in hosp showed TChol 105, TG 30, HDL 66, LDL 33 ~  FLP 2/12 on Cres20 showed TChol 97, TG 38, HDL 50, LDL 39... copy to Cards ?decr to 10mg ? ~  FLP 2/13 on Cres20 showed TChol 118, TG 42, HDL 60, LDL 50... Continue same. ~  [place] 3/14 on Cres20 showed TChol 128, TG 32, HDL 68, LDL 53  ~  FLP 1/15 on Cres20 showed TChol 133, TG 38, HDL 75, LDL 50 ~  FLP 1/16 on Cres20 showed TChol 154, TG 62, HDL 68, LDL 74  THYROID DISEASE >> Hx Amiodarone induced hyperthyroidism diagnosed 2/12 & referred to [facility], treated w/ PTU (now off) & DrHarwani tapered the Amio... ~  1/13:  She had f/u DrKumar> everything improve4d w/ reduction in Amio dose; clinically euthyroid & no goiter palpated; TFT's in his office were WNL==> TSH=3.74,  FreeT3=2.4 (2.0-4.4), FreeT4=0.91 (0.61-1.12) ~  Labs 3/14 showed TSH= 2.11 ~  Labs 1/15 showed TSH= 3.09 ~  Labs 1/16 showed TSH= 2.31  HIATAL HERNIA (ICD-553.3) - prev on Nexium & Carafate Liq- but  changed to PEPCID 20mg Bid by [facility] due to need for Plavix... she has a giant HH measuring 10cm per DrSam, w/ erosions in the pouch... last EGD by DrSam 4/08... hernia is seen on her CXRs. ~  9/14: she went to the ER w/ Epig pain & they felt it was reflux related due to her [place]; they switched Pepcid to Omep20Bid but she will need to change this to [place] due to the Plavix...  DIVERTICULOSIS OF COLON (ICD-562.10) & COLONIC POLYPS (ICD-211.3) ~  colonoscopy 11/06 showed divertics and 2- 3mm polyps... f/u planned in 4-76yrs... ~  Hosp 11/10 w/ lower GIB believed due to divertics... colonoscopy by DrJacobs showed severe divertics, no bleeding site, 19mm polyp removed...  RENAL INSUFFICIENCY >> she knows to avoid NSAIDs and incr fluid intake... ~  Labs 1/15 showed BUN= 42, Cr= 1.8 ~  Labs 7/15 showed BUN= 32, Cr= 1.7 ~  Labs 1/16 showed BUN= 27, Cr= 1.52  DEGENERATIVE JOINT DISEASE (ICD-715.90) & HIP PAIN, RIGHT (ICD-719.45) - currently taking OTC meds only but DrDean rec Vicodin for as needed use (she prefers Tylenol)... Eval by DrDean 3/09 w/ bone-on-bone right hip, tried injection & pain meds... right THR done 12/09...  VITAMIN D DEFICIENCY (ICD-268.9) ~  labs 5/09 showed Vit D level = 9... pt started on 50K weekly but she switched on her own to 1000u/d OTC. ~  labs 2/12 showed Vit D level = 18... asked to incr Vit D supplement to 2000 u daily (she never did). ~  Labs 2/13 showed Vit D level = 20... Asked to finally incr VitD supplement to 2000u daily... ~  Labs 3/14 showed Vit D level = 12 & Rec to switch to 50K weekly Rx... ~  Labs 1/15 showed VitD level = 50 & rec to continue VitD 50K weekly... ~  Labs 1/16 showed VitD level = 28 & reminded to take the VitD 50K weekly...  ANXIETY (ICD-300.00) - she is requesting a mild nerve pill & we have written ALPRAZOLAM 0.25mg  Prn use...  ANEMIA (ICD-285.9) - prev on Niferex 150mg /d, now FeSO4 325mg /d w/ Vit C 500mg ... she had an IDA from  her giant HH and erosions... ~  last blood count 11/08 showed Hg=13.7, Fe=56... ~  labs 5/09 showed Hg= 14.2.Virginia Casey. ~  labs 2/10 in hosp showed Hg= 13 ~  Hosp 11/10 w/  lower GIB, & Hg down to 8.6- tc 1u PC up to 9.3.Virginia KitchenMarland Casey ~  labs 12/10 showed Hg= 10.6.Virginia Casey. rec> continue oral Fe w/ VitC... ~  [facility] labs 7/11 showed Hg= 11.6 - 10.5, MCV= 70... rec> restart Fe. ~  labs 2/12 showed Hg= 12.4, MCV= 77, Fe= 56 (sat=10%)... continue Fe w/ VitC. ~  Labs 5/12 in hosp showed Hg= 12.3, MCV= 68, & she continues on FeSO4 daily w/ VitC... ~  Labs 2/13 showed Hg=13.1, MCV=78, Fe=66; rec to continue supplements... ~  Labs 3/14 showed Hg= 13.1 ~  Labs 9/14 showed Hg= 12.8, MCV=74 ~  Labs 1/15 showed Hg= 12.3, MCV=78 and rec to restart FeSO4 $RemoveBefor'325mg'foVEeFVipMHk$  daily... ~  Labs 7/15 showed Hg= 13.2, MCV=78, Fe=73 (15%sat)... rec to continue supplements. ~  Labs 1/16 showed Hg= 13.2, MCV=77, and reminded to continue daily supplements.  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 ~  Immuniz:  She had TDAP 03/2008;  Pneumovax-23 in 05/2009;  Given yearly Flu vaccines...   Past Surgical History  Procedure Laterality Date  . Total hip arthroplasty Right 12/09    Dr Marlou Sa  . Coronary angioplasty with stent placement  04/2008    "1; Dr Terrence Dupont" (10/06/2012)  . Coronary angioplasty  10/06/2012  . Dilation and curettage of uterus    . Left heart catheterization with coronary angiogram N/A 10/06/2012    Procedure: LEFT HEART CATHETERIZATION WITH CORONARY ANGIOGRAM;  Surgeon: Clent Demark, MD;  Location: [facility] CATH LAB;  Service: Cardiovascular;  Laterality: N/A;    Outpatient Encounter Prescriptions as of 03/23/2014  Medication Sig  . acetaminophen (TYLENOL) 325 MG tablet Take 325 mg by mouth every 6 (six) hours as needed. For pain  . ALPRAZolam (XANAX) 0.25 MG tablet take 1 tablet by mouth three times a day if needed  . amiodarone (PACERONE) 200 MG tablet Take 200 mg by mouth  daily.   Virginia Casey amLODipine (NORVASC) 5 MG tablet Take 5 mg by mouth daily.  Virginia Casey aspirin 81 MG tablet Take 81 mg by mouth daily.   . clopidogrel (PLAVIX) 75 MG tablet Take 1 tablet (75 mg total) by mouth daily with breakfast.  . ferrous gluconate (FERGON) 325 MG tablet Take 325 mg by mouth daily with breakfast. With vitamin C tablet   . Ipratropium-Albuterol (COMBIVENT RESPIMAT) 20-100 MCG/ACT AERS respimat Inhale 1 puff into the lungs every 6 (six) hours.  Virginia Casey losartan (COZAAR) 100 MG tablet take 1 tablet by mouth once daily  . nitroGLYCERIN (NITROSTAT) 0.4 MG SL tablet Place 0.4 mg under the tongue every 5 (five) minutes as needed. May repeat x3   . pantoprazole (PROTONIX) 40 MG tablet take 1 tablet by mouth twice a day  . rosuvastatin (CRESTOR) 20 MG tablet Take 20 mg by mouth daily.   . vitamin C (ASCORBIC ACID) 500 MG tablet Take 500 mg by mouth daily. With iron tablet   . Vitamin D, Ergocalciferol, (DRISDOL) 50000 UNITS CAPS capsule take 1 capsule by mouth every week    No Known Allergies   Current Medications, Allergies, Past Medical History, Past Surgical History, Family History, and Social History were reviewed in [employer] record.   Review of Systems         See HPI - all other systems neg except as noted... The patient complains of dyspnea on exertion.  The patient denies anorexia, fever, weight loss, weight gain, vision loss, decreased hearing, hoarseness,  chest pain, syncope, peripheral edema, prolonged cough, headaches, hemoptysis, abdominal pain, melena, hematochezia, severe indigestion/heartburn, hematuria, incontinence, muscle weakness, suspicious skin lesions, transient blindness, difficulty walking, depression, unusual weight change, abnormal bleeding, enlarged lymph nodes, and angioedema.     Objective:   Physical Exam     WD, WN, 81 y/o BF in NAD... GENERAL:  Alert & oriented; pleasant & cooperative... HEENT:  [place]/AT, EOM-wnl, EACs-clear, TMs-wnl,  NOSE-clear, THROAT-clear & wnl. NECK:  Supple w/ fairROM; no JVD; normal carotid impulses w/o bruits; no thyromegaly or nodules palpated; no lymphadenopathy. CHEST:  Coarse BS w/ no wheezing, no rales, no signs of consolidation. HEART:  Regular Rhythm; gr 1/6 SEM without rubs or gallops detected... ABDOMEN:  Soft & nontender; normal bowel sounds; no organomegaly or masses palpated... EXT: without deformities, mod arthritic changes; s/p right THR; no varicose veins/ +venous insuffic/  tr edema . BACK:  sl tender along right PSIS area... NEURO:  CN's intact; no focal neuro defict... DERM:  no lesions seen...  RADIOLOGY DATA:  Reviewed in the EPIC EMR & discussed w/ the patient...  LABORATORY DATA:  Reviewed in the EPIC EMR & discussed w/ the patient...     Assessment & Plan:    AB>  Improved overall & she stopped all meds on her own=> using the COMBIVENT respimat prn but hasn't needed...  HBP>  Fair control on the Losartan & Amlodipine; asked to monitor BP at home...  ASHD>  Followed by Virginia Casey for Cards> cath 8/14 w/ LAD instent restenosis, s/p successful PCI & she is back to baseline; Note: ER 9/14 switched her Pepcid to Omep20Bid but her Plavix is critically important to help patency of the LAD stent; therefore we will switch the Omep to PANTOPRAZOLE40Bid...  AFlutter>  DrHarwani has her on Amio $Remov'200mg'cDXOfA$  daily; her prev thyroid problems have resolved...  CHOL>  On Cres20 w/ good control of FLP...  THYROID DISEASE>  Prev Amio induced hyperthyroidism managed by DrKumar (on PTU prev) & resolved on lower dose of Amio...  GI>  Hx HH/ GERD/ Divertics/ Polyps>  She is followed by DrJacobs for GI; doing satis on the Protonix rx...  GU> mild renal insuffic is stable w/ Cr=1.7-1.6 on current meds...  DJD>  She is followed by DrDean for Ortho...  VitD defic>  She remains on VitD 50K weekly, reminded to take it every wk...  Anemia>  Hg is OK but MCV is low, Fe was 73 (improved) & she will  continue her Fe & VitC supplements...  Anxiety>  Requesting a mild nerve pill & we wrote for Alprazolam...   Patient's Medications  New Prescriptions   FERROUS SULFATE 325 (65 FE) MG TABLET    Take 1 tablet (325 mg total) by mouth daily with breakfast.  Previous Medications   ACETAMINOPHEN (TYLENOL) 325 MG TABLET    Take 325 mg by mouth every 6 (six) hours as needed. For pain   AMIODARONE (PACERONE) 200 MG TABLET    Take 200 mg by mouth daily.    AMLODIPINE (NORVASC) 5 MG TABLET    Take 5 mg by mouth daily.   ASPIRIN 81 MG TABLET    Take 81 mg by mouth daily.    CLOPIDOGREL (PLAVIX) 75 MG TABLET    Take 1 tablet (75 mg total) by mouth daily with breakfast.   FERROUS GLUCONATE (FERGON) 325 MG TABLET    Take 325 mg by mouth daily with breakfast. With vitamin C tablet    IPRATROPIUM-ALBUTEROL (COMBIVENT RESPIMAT) 20-100 MCG/ACT AERS  RESPIMAT    Inhale 1 puff into the lungs every 6 (six) hours.   NITROGLYCERIN (NITROSTAT) 0.4 MG SL TABLET    Place 0.4 mg under the tongue every 5 (five) minutes as needed. May repeat x3    PANTOPRAZOLE (PROTONIX) 40 MG TABLET    take 1 tablet by mouth twice a day   ROSUVASTATIN (CRESTOR) 20 MG TABLET    Take 20 mg by mouth daily.    VITAMIN C (ASCORBIC ACID) 500 MG TABLET    Take 500 mg by mouth daily. With iron tablet   Modified Medications   Modified Medication Previous Medication   ALPRAZOLAM (XANAX) 0.25 MG TABLET ALPRAZolam (XANAX) 0.25 MG tablet      take 1 tablet by mouth three times a day if needed    take 1 tablet by mouth three times a day if needed   LOSARTAN (COZAAR) 100 MG TABLET losartan (COZAAR) 100 MG tablet      take 1 tablet by mouth once daily    take 1 tablet by mouth once daily   VITAMIN D, ERGOCALCIFEROL, (DRISDOL) 50000 UNITS CAPS CAPSULE Vitamin D, Ergocalciferol, (DRISDOL) 50000 UNITS CAPS capsule      Take 1 capsule (50,000 Units total) by mouth once a week.    take 1 capsule by mouth every week  Discontinued Medications   No  medications on file

## 2014-03-25 ENCOUNTER — Encounter (HOSPITAL_COMMUNITY): Payer: Self-pay

## 2014-03-28 ENCOUNTER — Encounter (HOSPITAL_COMMUNITY): Payer: Self-pay

## 2014-03-30 ENCOUNTER — Encounter (HOSPITAL_COMMUNITY): Payer: Self-pay

## 2014-03-31 DIAGNOSIS — I480 Paroxysmal atrial fibrillation: Secondary | ICD-10-CM | POA: Diagnosis not present

## 2014-03-31 DIAGNOSIS — I252 Old myocardial infarction: Secondary | ICD-10-CM | POA: Diagnosis not present

## 2014-03-31 DIAGNOSIS — N189 Chronic kidney disease, unspecified: Secondary | ICD-10-CM | POA: Diagnosis not present

## 2014-03-31 DIAGNOSIS — I10 Essential (primary) hypertension: Secondary | ICD-10-CM | POA: Diagnosis not present

## 2014-03-31 DIAGNOSIS — I2510 Atherosclerotic heart disease of native coronary artery without angina pectoris: Secondary | ICD-10-CM | POA: Diagnosis not present

## 2014-04-01 ENCOUNTER — Encounter (HOSPITAL_COMMUNITY): Payer: Self-pay

## 2014-04-04 ENCOUNTER — Encounter (HOSPITAL_COMMUNITY): Payer: Medicare Other

## 2014-04-04 DIAGNOSIS — I252 Old myocardial infarction: Secondary | ICD-10-CM | POA: Insufficient documentation

## 2014-04-04 DIAGNOSIS — I4891 Unspecified atrial fibrillation: Secondary | ICD-10-CM | POA: Insufficient documentation

## 2014-04-04 DIAGNOSIS — Z9861 Coronary angioplasty status: Secondary | ICD-10-CM | POA: Insufficient documentation

## 2014-04-04 DIAGNOSIS — I2510 Atherosclerotic heart disease of native coronary artery without angina pectoris: Secondary | ICD-10-CM | POA: Insufficient documentation

## 2014-04-04 DIAGNOSIS — Z5189 Encounter for other specified aftercare: Secondary | ICD-10-CM | POA: Insufficient documentation

## 2014-04-06 ENCOUNTER — Encounter (HOSPITAL_COMMUNITY)
Admission: RE | Admit: 2014-04-06 | Discharge: 2014-04-06 | Disposition: A | Discharge status: Home / Self Care | Payer: Self-pay | Source: Ambulatory Visit | Attending: Cardiology | Admitting: Cardiology

## 2014-04-08 ENCOUNTER — Encounter (HOSPITAL_COMMUNITY)
Admission: RE | Admit: 2014-04-08 | Discharge: 2014-04-08 | Disposition: A | Discharge status: Home / Self Care | Payer: Self-pay | Source: Ambulatory Visit | Attending: Cardiology | Admitting: Cardiology

## 2014-04-11 ENCOUNTER — Encounter (HOSPITAL_COMMUNITY): Payer: Self-pay

## 2014-04-13 ENCOUNTER — Encounter (HOSPITAL_COMMUNITY): Payer: Self-pay

## 2014-04-15 ENCOUNTER — Encounter (HOSPITAL_COMMUNITY): Payer: Self-pay

## 2014-04-18 ENCOUNTER — Encounter (HOSPITAL_COMMUNITY): Payer: Self-pay

## 2014-04-20 ENCOUNTER — Encounter (HOSPITAL_COMMUNITY)
Admission: RE | Admit: 2014-04-20 | Discharge: 2014-04-20 | Disposition: A | Discharge status: Home / Self Care | Payer: Self-pay | Source: Ambulatory Visit | Attending: Cardiology | Admitting: Cardiology

## 2014-04-22 ENCOUNTER — Encounter (HOSPITAL_COMMUNITY)
Admission: RE | Admit: 2014-04-22 | Discharge: 2014-04-22 | Disposition: A | Discharge status: Home / Self Care | Payer: Self-pay | Source: Ambulatory Visit | Attending: Cardiology | Admitting: Cardiology

## 2014-04-25 ENCOUNTER — Encounter (HOSPITAL_COMMUNITY): Payer: Self-pay

## 2014-04-27 ENCOUNTER — Encounter (HOSPITAL_COMMUNITY)
Admission: RE | Admit: 2014-04-27 | Discharge: 2014-04-27 | Disposition: A | Discharge status: Home / Self Care | Payer: Self-pay | Source: Ambulatory Visit | Attending: Cardiology | Admitting: Cardiology

## 2014-04-29 ENCOUNTER — Encounter (HOSPITAL_COMMUNITY): Payer: Self-pay

## 2014-05-02 ENCOUNTER — Encounter (HOSPITAL_COMMUNITY): Payer: Medicare Other

## 2014-05-06 ENCOUNTER — Encounter (HOSPITAL_COMMUNITY)
Admission: RE | Admit: 2014-05-06 | Discharge: 2014-05-06 | Disposition: A | Discharge status: Home / Self Care | Payer: Self-pay | Source: Ambulatory Visit | Attending: Cardiology | Admitting: Cardiology

## 2014-05-06 DIAGNOSIS — Z9861 Coronary angioplasty status: Secondary | ICD-10-CM | POA: Insufficient documentation

## 2014-05-06 DIAGNOSIS — I252 Old myocardial infarction: Secondary | ICD-10-CM | POA: Insufficient documentation

## 2014-05-06 DIAGNOSIS — Z5189 Encounter for other specified aftercare: Secondary | ICD-10-CM | POA: Insufficient documentation

## 2014-05-06 DIAGNOSIS — I4891 Unspecified atrial fibrillation: Secondary | ICD-10-CM | POA: Insufficient documentation

## 2014-05-06 DIAGNOSIS — I2510 Atherosclerotic heart disease of native coronary artery without angina pectoris: Secondary | ICD-10-CM | POA: Insufficient documentation

## 2014-05-09 ENCOUNTER — Encounter (HOSPITAL_COMMUNITY)
Admission: RE | Admit: 2014-05-09 | Discharge: 2014-05-09 | Disposition: A | Discharge status: Home / Self Care | Payer: Self-pay | Source: Ambulatory Visit | Attending: Cardiology | Admitting: Cardiology

## 2014-05-11 ENCOUNTER — Encounter (HOSPITAL_COMMUNITY)
Admission: RE | Admit: 2014-05-11 | Discharge: 2014-05-11 | Disposition: A | Discharge status: Home / Self Care | Payer: Self-pay | Source: Ambulatory Visit | Attending: Cardiology | Admitting: Cardiology

## 2014-05-13 ENCOUNTER — Encounter (HOSPITAL_COMMUNITY): Payer: Self-pay

## 2014-05-16 ENCOUNTER — Encounter (HOSPITAL_COMMUNITY): Payer: Self-pay

## 2014-05-18 ENCOUNTER — Encounter (HOSPITAL_COMMUNITY): Payer: Self-pay

## 2014-05-20 ENCOUNTER — Encounter (HOSPITAL_COMMUNITY): Payer: Self-pay

## 2014-05-23 ENCOUNTER — Telehealth: Payer: Self-pay | Admitting: Pulmonary Disease

## 2014-05-23 ENCOUNTER — Other Ambulatory Visit: Payer: Self-pay

## 2014-05-23 ENCOUNTER — Encounter (HOSPITAL_COMMUNITY): Payer: Self-pay

## 2014-05-23 MED ORDER — LEVOFLOXACIN 500 MG PO TABS: 500.0000 mg | ORAL_TABLET | Freq: Every day | ORAL | Status: DC

## 2014-05-23 MED ORDER — PREDNISONE (PAK) 5 MG PO TABS: 5.0000 mg | ORAL_TABLET | Freq: Every day | ORAL | Status: DC

## 2014-05-23 NOTE — Telephone Encounter (Signed)
Called and spoke with patient about recommendations from SN. Informed pt that SN advised to increase combivent 1 puff QID, and that prescriptions for levaquin 500mg  # 7 1 PO QD, and prednisone 5mg  pack were called into her pharmacy. Also informed patient that SN recommended to take mucinex 600mg  PO BID PRN for cough and congestion and align to help with GI effects of antibiotic. Patient voiced understanding of the above orders and was told at this time if she does not get better to call us back. No other action needs to be taken at this time.

## 2014-05-23 NOTE — Telephone Encounter (Signed)
Called and spoke to pt. Pt c/o constant prod cough with pale yellow mucus, increase in SOB, chest tightness when coughing, weakness, insomnia secondary to cough x 4 days. Pt denies f/c/s. Pt stated she is taking tylenol for occasional back pain.   SN please advise.   No Known Allergies Current Outpatient Prescriptions on File Prior to Visit  Medication Sig Dispense Refill  . acetaminophen (TYLENOL) 325 MG tablet Take 325 mg by mouth every 6 (six) hours as needed. For pain    . ALPRAZolam (XANAX) 0.25 MG tablet take 1 tablet by mouth three times a day if needed 90 tablet 5  . amiodarone (PACERONE) 200 MG tablet Take 200 mg by mouth daily.     [name] amLODipine (NORVASC) 5 MG tablet Take 5 mg by mouth daily.    [name] aspirin 81 MG tablet Take 81 mg by mouth daily.     . clopidogrel (PLAVIX) 75 MG tablet Take 1 tablet (75 mg total) by mouth daily with breakfast. 30 tablet 3  . ferrous gluconate (FERGON) 325 MG tablet Take 325 mg by mouth daily with breakfast. With vitamin C tablet     . ferrous sulfate 325 (65 FE) MG tablet Take 1 tablet (325 mg total) by mouth daily with breakfast. 30 tablet 11  . Ipratropium-Albuterol (COMBIVENT RESPIMAT) 20-100 MCG/ACT AERS respimat Inhale 1 puff into the lungs every 6 (six) hours. 4 g 11  . losartan (COZAAR) 100 MG tablet take 1 tablet by mouth once daily 90 tablet 1  . nitroGLYCERIN (NITROSTAT) 0.4 MG SL tablet Place 0.4 mg under the tongue every 5 (five) minutes as needed. May repeat x3     . pantoprazole (PROTONIX) 40 MG tablet take 1 tablet by mouth twice a day 60 tablet 11  . rosuvastatin (CRESTOR) 20 MG tablet Take 20 mg by mouth daily.     . vitamin C (ASCORBIC ACID) 500 MG tablet Take 500 mg by mouth daily. With iron tablet     . Vitamin D, Ergocalciferol, (DRISDOL) 50000 UNITS CAPS capsule Take 1 capsule (50,000 Units total) by mouth once a week. 4 capsule 11   No current facility-administered medications on file prior to visit.

## 2014-05-25 ENCOUNTER — Encounter (HOSPITAL_COMMUNITY): Payer: Self-pay

## 2014-05-27 ENCOUNTER — Encounter (HOSPITAL_COMMUNITY): Payer: Self-pay

## 2014-05-30 ENCOUNTER — Encounter (HOSPITAL_COMMUNITY): Payer: Self-pay

## 2014-06-01 ENCOUNTER — Encounter (HOSPITAL_COMMUNITY): Payer: Self-pay

## 2014-06-03 ENCOUNTER — Encounter (HOSPITAL_COMMUNITY): Payer: Medicare Other

## 2014-06-03 DIAGNOSIS — I4891 Unspecified atrial fibrillation: Secondary | ICD-10-CM | POA: Insufficient documentation

## 2014-06-03 DIAGNOSIS — I252 Old myocardial infarction: Secondary | ICD-10-CM | POA: Insufficient documentation

## 2014-06-03 DIAGNOSIS — Z5189 Encounter for other specified aftercare: Secondary | ICD-10-CM | POA: Insufficient documentation

## 2014-06-03 DIAGNOSIS — Z9861 Coronary angioplasty status: Secondary | ICD-10-CM | POA: Insufficient documentation

## 2014-06-03 DIAGNOSIS — I2510 Atherosclerotic heart disease of native coronary artery without angina pectoris: Secondary | ICD-10-CM | POA: Insufficient documentation

## 2014-06-06 ENCOUNTER — Encounter (HOSPITAL_COMMUNITY): Payer: Self-pay

## 2014-06-08 ENCOUNTER — Encounter (HOSPITAL_COMMUNITY): Payer: Self-pay

## 2014-06-10 ENCOUNTER — Encounter (HOSPITAL_COMMUNITY): Payer: Self-pay

## 2014-06-13 ENCOUNTER — Encounter (HOSPITAL_COMMUNITY): Payer: Self-pay

## 2014-06-15 ENCOUNTER — Encounter (HOSPITAL_COMMUNITY): Payer: Self-pay

## 2014-06-17 ENCOUNTER — Encounter (HOSPITAL_COMMUNITY): Payer: Self-pay

## 2014-06-20 ENCOUNTER — Encounter (HOSPITAL_COMMUNITY)
Admission: RE | Admit: 2014-06-20 | Discharge: 2014-06-20 | Disposition: A | Discharge status: Home / Self Care | Payer: Self-pay | Source: Ambulatory Visit | Attending: Cardiology | Admitting: Cardiology

## 2014-06-22 ENCOUNTER — Encounter (HOSPITAL_COMMUNITY)
Admission: RE | Admit: 2014-06-22 | Discharge: 2014-06-22 | Disposition: A | Discharge status: Home / Self Care | Payer: Self-pay | Source: Ambulatory Visit | Attending: Cardiology | Admitting: Cardiology

## 2014-06-24 ENCOUNTER — Encounter (HOSPITAL_COMMUNITY): Payer: Self-pay

## 2014-06-27 ENCOUNTER — Encounter (HOSPITAL_COMMUNITY)
Admission: RE | Admit: 2014-06-27 | Discharge: 2014-06-27 | Disposition: A | Discharge status: Home / Self Care | Payer: Self-pay | Source: Ambulatory Visit | Attending: Cardiology | Admitting: Cardiology

## 2014-06-29 ENCOUNTER — Encounter (HOSPITAL_COMMUNITY): Payer: Self-pay

## 2014-06-30 DIAGNOSIS — N189 Chronic kidney disease, unspecified: Secondary | ICD-10-CM | POA: Diagnosis not present

## 2014-06-30 DIAGNOSIS — I2510 Atherosclerotic heart disease of native coronary artery without angina pectoris: Secondary | ICD-10-CM | POA: Diagnosis not present

## 2014-06-30 DIAGNOSIS — I480 Paroxysmal atrial fibrillation: Secondary | ICD-10-CM | POA: Diagnosis not present

## 2014-06-30 DIAGNOSIS — I10 Essential (primary) hypertension: Secondary | ICD-10-CM | POA: Diagnosis not present

## 2014-06-30 DIAGNOSIS — I252 Old myocardial infarction: Secondary | ICD-10-CM | POA: Diagnosis not present

## 2014-07-01 ENCOUNTER — Encounter (HOSPITAL_COMMUNITY)
Admission: RE | Admit: 2014-07-01 | Discharge: 2014-07-01 | Disposition: A | Discharge status: Home / Self Care | Payer: Self-pay | Source: Ambulatory Visit | Attending: Cardiology | Admitting: Cardiology

## 2014-07-04 ENCOUNTER — Encounter (HOSPITAL_COMMUNITY): Payer: Self-pay

## 2014-07-04 DIAGNOSIS — I2510 Atherosclerotic heart disease of native coronary artery without angina pectoris: Secondary | ICD-10-CM | POA: Insufficient documentation

## 2014-07-04 DIAGNOSIS — Z9861 Coronary angioplasty status: Secondary | ICD-10-CM | POA: Insufficient documentation

## 2014-07-04 DIAGNOSIS — Z5189 Encounter for other specified aftercare: Secondary | ICD-10-CM | POA: Insufficient documentation

## 2014-07-04 DIAGNOSIS — I252 Old myocardial infarction: Secondary | ICD-10-CM | POA: Insufficient documentation

## 2014-07-04 DIAGNOSIS — I4891 Unspecified atrial fibrillation: Secondary | ICD-10-CM | POA: Insufficient documentation

## 2014-07-06 ENCOUNTER — Encounter (HOSPITAL_COMMUNITY): Payer: Medicare Other

## 2014-07-08 ENCOUNTER — Encounter (HOSPITAL_COMMUNITY)
Admission: RE | Admit: 2014-07-08 | Discharge: 2014-07-08 | Disposition: A | Discharge status: Home / Self Care | Payer: Self-pay | Source: Ambulatory Visit | Attending: Cardiology | Admitting: Cardiology

## 2014-07-11 ENCOUNTER — Encounter (HOSPITAL_COMMUNITY): Payer: Self-pay

## 2014-07-13 ENCOUNTER — Encounter (HOSPITAL_COMMUNITY)
Admission: RE | Admit: 2014-07-13 | Discharge: 2014-07-13 | Disposition: A | Discharge status: Home / Self Care | Payer: Self-pay | Source: Ambulatory Visit | Attending: Cardiology | Admitting: Cardiology

## 2014-07-15 ENCOUNTER — Encounter (HOSPITAL_COMMUNITY)
Admission: RE | Admit: 2014-07-15 | Discharge: 2014-07-15 | Disposition: A | Discharge status: Home / Self Care | Payer: Self-pay | Source: Ambulatory Visit | Attending: Cardiology | Admitting: Cardiology

## 2014-07-18 ENCOUNTER — Encounter (HOSPITAL_COMMUNITY): Payer: Self-pay

## 2014-07-20 ENCOUNTER — Encounter (HOSPITAL_COMMUNITY): Payer: Self-pay

## 2014-07-22 ENCOUNTER — Encounter (HOSPITAL_COMMUNITY)
Admission: RE | Admit: 2014-07-22 | Discharge: 2014-07-22 | Disposition: A | Discharge status: Home / Self Care | Payer: Self-pay | Source: Ambulatory Visit | Attending: Cardiology | Admitting: Cardiology

## 2014-07-25 ENCOUNTER — Encounter (HOSPITAL_COMMUNITY)
Admission: RE | Admit: 2014-07-25 | Discharge: 2014-07-25 | Disposition: A | Discharge status: Home / Self Care | Payer: Self-pay | Source: Ambulatory Visit | Attending: Cardiology | Admitting: Cardiology

## 2014-07-27 ENCOUNTER — Encounter (HOSPITAL_COMMUNITY)
Admission: RE | Admit: 2014-07-27 | Discharge: 2014-07-27 | Disposition: A | Discharge status: Home / Self Care | Payer: Self-pay | Source: Ambulatory Visit | Attending: Cardiology | Admitting: Cardiology

## 2014-07-27 ENCOUNTER — Encounter: Payer: Self-pay | Admitting: Pulmonary Disease

## 2014-07-27 ENCOUNTER — Ambulatory Visit (INDEPENDENT_AMBULATORY_CARE_PROVIDER_SITE_OTHER): Payer: Medicare Other | Admitting: Pulmonary Disease

## 2014-07-27 VITALS — BP 110/70 | HR 70 | Temp 97.2°F | Resp 16 | Ht 61.0 in | Wt 163.0 lb

## 2014-07-27 DIAGNOSIS — F411 Generalized anxiety disorder: Secondary | ICD-10-CM

## 2014-07-27 DIAGNOSIS — I2510 Atherosclerotic heart disease of native coronary artery without angina pectoris: Secondary | ICD-10-CM

## 2014-07-27 DIAGNOSIS — M545 Low back pain, unspecified: Secondary | ICD-10-CM

## 2014-07-27 DIAGNOSIS — I10 Essential (primary) hypertension: Secondary | ICD-10-CM

## 2014-07-27 DIAGNOSIS — I4892 Unspecified atrial flutter: Secondary | ICD-10-CM

## 2014-07-27 DIAGNOSIS — K440 Diaphragmatic hernia with obstruction, without gangrene: Secondary | ICD-10-CM

## 2014-07-27 DIAGNOSIS — K589 Irritable bowel syndrome without diarrhea: Secondary | ICD-10-CM | POA: Diagnosis not present

## 2014-07-27 DIAGNOSIS — K5730 Diverticulosis of large intestine without perforation or abscess without bleeding: Secondary | ICD-10-CM

## 2014-07-27 DIAGNOSIS — D126 Benign neoplasm of colon, unspecified: Secondary | ICD-10-CM

## 2014-07-27 DIAGNOSIS — M159 Polyosteoarthritis, unspecified: Secondary | ICD-10-CM

## 2014-07-27 DIAGNOSIS — M150 Primary generalized (osteo)arthritis: Secondary | ICD-10-CM

## 2014-07-27 MED ORDER — DICYCLOMINE HCL 20 MG PO TABS: ORAL_TABLET | ORAL | Status: AC

## 2014-07-27 NOTE — Progress Notes (Signed)
Subjective:    Patient ID: Virginia Casey, female    DOB: 09/01/1924, 81 y.o.   MRN: [id number]  HPI 81 y/o BF here for a follow up visit... she has mult med problems as noted below...  ~  SEE PREV EPIC NOTES FOR OLDER DATA >>    CXR 7/13 showed normal heart size, largeHH, bibasilar atx, otherw clear...  EKG 7/13 showed SBrady, rate52, no acute changes...  Myoview 8/13 at [facility] showed no ischemia or infarction, normal wall motion, EF=70%...  LABS 7/13:  Chems- wnl;  CBC- wnl...   LABS 3/14:  FLP- at goals on Cres20;  Chems- wnl;  CBC- wnl;  TSH=2.11;  VitD=12 & Rec to start 50,000u weekly supplement...   CXR 7/14 showed cardiomeg, largeHH, NAD...   PFT's were attempted but pt absolutely could not Casey the simple spirometry...  LABS 7/14 showed Chems- wnl, CBC- ok x plat=113K  ~  March 23, 2013:  73mo ROV & Anni reports doing well, no new complaints or concerns...    Chol> on Cres20; FLP 1/15 showed TChol 133, TG 38, HDL 75, LDL 50- we don't have notes/ labs from [facility]....    Renal insuffic> on Amlod5, Losar100, Amio200; Labs 1/15 showed BUN=42, Cr=1.8.Marland KitchenMarland Kitchen She knows to avoid NSAIDs & incr fluids...    Anemia> Hg=12.3, MCV=78; needs to restart Fe daily & stay on this... [name] prob list, meds, xrays and labs> see below for updates >>   LABS 1/15:  FLP- at goals on Cres20;  Chems- ok x renal insuffic w/ BUN=42, Cr=1.8;  CBC- ok w/ Hg=12.3, MCV=78;  TSH=3.09;  VitD=50... REC to continue Cres20, avoid NSAIDs & incr fluids, start FeSO4 daily and continue VitD50K weekly...   ~  September 20, 2013:  60mo ROV & Virginia Casey noted diarrhea x3d last week for which she took some Pepto & now much improved... She reports doing well overall at age 72 w/o other complaints or concerns...  She's been doing Cardiac rehab & really likes it...    She continues regular f/u w/ Cards- DrHarwani on Amlod5, Losar100, Amio200, ASA.Plavix; BP= 130/80 & she denies CP, palpit, SOB, edema, or cerebral ischemic  symptoms...    She remains on Cres20 & FLP 1/15 looked great...     She is clinically & biochem euthyroid w/ TSH 1/15 = 3.09...    She has mild renal insuffic w/ Labs 7/15 showing BUN= 32, Cr= 1.7 which is stable since last Sep    Hx Anemia on Fe daily> Labs 7/15 showed Hg=13.2 (up 1pt), MCV=78, Fe=73 (15%sat) and rec to continue same meds & supplements... [name] prob list, meds, xrays and labs> see below for updates >>  REC to continue same meds and supplements including the Fe...  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     AB> on Combivent prn (hasn't needed in a long time); doing well, no resp infections or exacerbations...    HBP> on Losartan100 & Amlod5; BP= 132/86 & she denies CP, palpit, SOB, edema; rec to continue same meds + diet...    CAD> followed by [name] on ASA81, Plavix75; eval 7/14 w/ abn Myoview & subseq cath 8/14 showed in-stent restenosis in LAD, s/p PCI & back to baseline...    PAFlutter> still on Amio200 per [facility]; she denies palpit, dizzy,  ch in SOB, etc...    Cerebrovasc dis> on ASA81/ Plavix75; she denies cerebral ischemic symptoms...    Chol> on Cres20; FLP 1/16 shows TChol 154, TG 62, HDL 68, LDL 74; continue diet & Cres20...    Thyroid dis> followed by [name]; she has hx hyperthyroidism secondary to Amio & a prob multinod goiter; treated w/ Tapazole, then PTU; now off PTU & "everything is fine"; Labs 1/16 showed TSH= 2.31...    GI> HH, Divertics, Polyps> on Protonix40Bid to avoid Plavix interaction; doing satis w/o abd pain, n/v, c/d, blood seen...    Renal insuffic> on Amlod5, Losar100, Amio200; Labs 1/16 showed BUN=27, Cr=1.52 (improved)... She knows to avoid NSAIDs & incr fluids...    DJD, VitD defic> on Tylenol & VitD 50K per week since labs 3/14 showed VitD level lower at 12; f/u VitD level 1/16= 28,  therefore continue 50K weekly...    Anxiety> on Alpraz0.25 prn which really helps...    Anemia> on Fe w/ VitC; Labs 1/16 showed Hg=13.2, MCV=77, Fe= needs to be repeated; rec to continue supplements daily... [name] prob list, meds, xrays and labs> see below for updates >> Given 2015 Flu vaccine today, OK refills as requested.[name]  LABS 1/16:  FLP- within parameters on Cres20;  Chems- ok x Cr=1.52;  CBC- ok x MCV=77 & Plat=112;  TSH=2.31;  VitD=28... PLAN>> Continue same meds, refills given per request; rec to continue MVI, FeSO4 daily, & VitD 50K weekly...  ~  Jul 27, 2014:  43mo ROV & add-on appt requested for "stomach issues">  Virginia Casey is c/o "my divertics are acting up" over the last week;  Notes periumbil pain off & on, ?ppt events, lasts up to 1h, relief spont; she denies n/v, d/c/bl seen, no f/c/s; her ROS is otherw neg w/o CP, palpit, SOB, cough/ phlegm, etc...       GI> HH, Divertics, Polyps> on Protonix40Bid to avoid Plavix interaction; she has a giantHH measuring 10cm w/ erosions on last EGD 4/08 by DrSam; she was Hosp 11/10 w/ lower GIB believed due to divertics- colon by DrJacobs w/ mult divertics throughout & one adenomatous polyp removed from the sigmoid colon...       EXAM shows Afeb, VSS, O2sat=99% on RA;  HEENT- neg;  Chest- clear w/o w/r/r;  Cor- RR gr1/6SEM w/o r/g;  Abd- soft, nontender, w/o masses; Ext- neg w/o c/c/e... IMP/PLAN>>  Sounds like IBS & we discussed treating her cramping pain w/ BENTYL 20mg - 1/2 to 1 tab qid as needed; if symptoms persist we will request GI consultation...           Problem List:  Hearing Loss >> she saw DrCrossley 3/13 w/ a high freq hearing loss & he removed creumen impactions' they are holding off on hearing aides for now...  ASTHMATIC BRONCHITIS (ICD-466.0) - breathing is stable now- s/p recent exacerbation... ~  CXR 1/12 showed basilar atx & large HH, cardiomeg, osteopenia, NAD... ~  [facility] 5/12 w/ refractory AB exac & improved w/ standard  therapy & brief course of Pred; CXR- similar, NAD.[name]. ~  CXR 7/13 showed norm heart size, large HH, clear lungs, NAD.[name]. ~  7/14:  She presented w/ a mild exac brought on by the stress of a trip to [place] etc=> Rx w/ COMBIVENT every 6h as needed... ~  CXR 7/14 showed cardiomeg, largeHH, NAD... We attempted PFTs but she was unable to perform... Os sats were 98% on RA... ~  She remains stable, denies breathing problems and  hasn't needed the Combivent...  HYPERTENSION (ICD-401.9) - now on LOSARTAN 100mg /d & AMLODIPINE 5mg /d (and off her prev Ramipril due to asthma & Imdur due to HA) ...  ~  8/12:  BP= 110/72 & not checking BP's at home; she denies visual changes, CP, palipit, syncope, dyspnea, edema, etc... ~  2/13:  BP= 122/82 & she is stable, mostly asymptomatic w/o CP, palpit, SOB, etc... ~  8/13:  BP= 124/86 & she remains stable... ~  3/14: on Losartan100 & off Amlod5; BP= 130/84 & she denies CP, palpit, SOB, edema; rec to continue same meds + diet. ~  7/14: on Losartan100 & off Amlod5; BP= 132/92 & she denies CP, palpit, or edema... ~  10/14: now on Losartan100 + Amlodipine5; BP= 118/88 & she remains asymptomatic... ~  7/15: on Amlod5, Losar100, Amio200, ASA.Plavix; BP= 130/80 & she denies CP, palpit, SOB, edema, or cerebral ischemic symptoms. ~  1/16: on Losartan100 & Amlod5; BP= 132/86 & she denies CP, palpit, SOB, edema; rec to continue same meds + diet. ~  5/16: on Losartan100 & Amlod5; BP= 110/70 & she remains asymptomatic.  ATHEROSCLEROTIC HEART DISEASE (ICD-414.00) & PERIPHERAL VASCULAR DISEASE (ICD-443.9) - regularly on ASA 81mg /d, & off Plavix... followed by [facility] for Cards (we Casey not have his notes to review med changes)>> ~  hosp in Jan08 w/ syncope and MRI Brain showed mild atrophy & sm vessel dis;  MRA showed basilar art narrowing & mod stenosis in RICA cavernous segment... CDopplers however were WNL w/ antegrade vertebral flow, and no signif ICA stenoses detected... ~   hosp Feb10 by [facility] w/ non-STEMI- s/p PTCA and stent in the LAD. ~  hosp 7/11 by [facility] w/ CP, Abn Myoview & cath w/ 70% midLAD in-stent resteosis; s/p PTCA & started back on Plavix... ~  she is in Cardiac Rehab & really likes it... ~  7/13:  She went to the ER w/ chest discomfort; ruled out for ischemia (EKG= SBrday, rate52, wnl/NAD) & followed up w/ DrHarwani> he did MYOVIEW 8/13 which was neg for ischemia or infarction, norm wall motion & EF=70%... ~  7/14:  She continues to f/u w/ [facility] her cardiologist;  She will see him soon to f/u from this [place] trip for additional test => Myoview was Abn & Cath 8/14 w/ in-stent restenosis, s/p PCI... ~  10/14: now on ASA81 + Plavix75 and enrolled in cardiac rehab which she loves... (Note: Omep20Bid give via ER 9/14 is being changed to Protonix40Bid due to her Plavix Rx)... ~  She continues to f/u regularly w/ DrHarwani (we Casey not have notes from him...  ATRIAL FLUTTER, PAROXYSMAL (ICD-427.32) - this occured 2/10 following her cath, PTCA, stent... he diagnosed flutter w/ 2:1 block... currently taking AMIODARONE 200mg /d... ~  She remains in NSR & denies CP, palpit, SOB, etc...  HYPERCHOLESTEROLEMIA (ICD-272.0) - on CRESTOR 20mg /d... ~  [place] 11/08 showed TChol 161, TG 39, HDL 62, LDL 92... continue same med. ~  [place] 5/09 showed TChol 209, TG 70, HDL 74, LDL 124... rec- better diet, get weight down! ~  FLP in hosp 2/10 showed TChol 130, TG 69, HDL 45, LDL 71 ~  she reports that [facility] does fasting labs... ~  FLP 7/11 in hosp showed TChol 105, TG 30, HDL 66, LDL 33 ~  FLP 2/12 on Cres20 showed TChol 97, TG 38, HDL 50, LDL 39... copy to Cards ?decr to 10mg ? ~  FLP 2/13 on Cres20 showed TChol 118, TG 42, HDL 60, LDL 50.Marland KitchenMarland Kitchen  Continue same. ~  [place] 3/14 on Cres20 showed TChol 128, TG 32, HDL 68, LDL 53  ~  FLP 1/15 on Cres20 showed TChol 133, TG 38, HDL 75, LDL 50 ~  FLP 1/16 on Cres20 showed TChol 154, TG 62, HDL 68, LDL 74  THYROID DISEASE >> Hx  Amiodarone induced hyperthyroidism diagnosed 2/12 & referred to [facility], treated w/ PTU (now off) & DrHarwani tapered the Amio... ~  1/13:  She had f/u DrKumar> everything improve4d w/ reduction in Amio dose; clinically euthyroid & no goiter palpated; TFT's in his office were WNL==> TSH=3.74,  FreeT3=2.4 (2.0-4.4), FreeT4=0.91 (0.61-1.12) ~  Labs 3/14 showed TSH= 2.11 ~  Labs 1/15 showed TSH= 3.09 ~  Labs 1/16 showed TSH= 2.31  HIATAL HERNIA (ICD-553.3) - prev on Nexium & Carafate Liq- but changed to PEPCID 20mg Bid by [facility] due to need for Plavix... she has a giant HH measuring 10cm per DrSam, w/ erosions in the pouch... last EGD by DrSam 4/08... hernia is seen on her CXRs. ~  9/14: she went to the ER w/ Epig pain & they felt it was reflux related due to her [place]; they switched Pepcid to Omep20Bid but she will need to change this to [place] due to the Plavix...  DIVERTICULOSIS OF COLON (ICD-562.10) & COLONIC POLYPS (ICD-211.3) ~  colonoscopy 11/06 showed divertics and 2- 8mm polyps... f/u planned in 4-80yrs... ~  Hosp 11/10 w/ lower GIB believed due to divertics... colonoscopy by DrJacobs showed severe divertics, no bleeding site, 28mm polyp (tubular adenoma) removed from sigmoid...  RENAL INSUFFICIENCY >> she knows to avoid NSAIDs and incr fluid intake... ~  Labs 1/15 showed BUN= 42, Cr= 1.8 ~  Labs 7/15 showed BUN= 32, Cr= 1.7 ~  Labs 1/16 showed BUN= 27, Cr= 1.52  DEGENERATIVE JOINT DISEASE (ICD-715.90) & HIP PAIN, RIGHT (ICD-719.45) - currently taking OTC meds only but DrDean rec Vicodin for as needed use (she prefers Tylenol)... Eval by DrDean 3/09 w/ bone-on-bone right hip, tried injection & pain meds... right THR done 12/09...  VITAMIN D DEFICIENCY (ICD-268.9) ~  labs 5/09 showed Vit D level = 9... pt started on 50K weekly but she switched on her own to 1000u/d OTC. ~  labs 2/12 showed Vit D level = 18... asked to incr Vit D supplement to 2000 u daily (she never did). ~  Labs  2/13 showed Vit D level = 20... Asked to finally incr VitD supplement to 2000u daily... ~  Labs 3/14 showed Vit D level = 12 & Rec to switch to 50K weekly Rx... ~  Labs 1/15 showed VitD level = 50 & rec to continue VitD 50K weekly... ~  Labs 1/16 showed VitD level = 28 & reminded to take the VitD 50K weekly...  ANXIETY (ICD-300.00) - she is requesting a mild nerve pill & we have written ALPRAZOLAM 0.25mg  Prn use...  ANEMIA (ICD-285.9) - prev on Niferex 150mg /d, now FeSO4 325mg /d w/ Vit C 500mg ... 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 ~  last blood count 11/08 showed Hg=13.7, Fe=56... ~  labs 5/09 showed Hg= 14.2.[name]. ~  labs 2/10 in hosp showed Hg= 13 ~  Hosp 11/10 w/ lower GIB, & Hg down to 8.6- tc 1u PC up to 9.3.Marland KitchenMarland Kitchen ~  labs 12/10 showed Hg= 10.6.[name]. rec> continue oral Fe w/ VitC... ~  [facility] labs 7/11 showed Hg= 11.6 - 10.5, MCV= 70... rec> restart Fe. ~  labs 2/12 showed Hg= 12.4, MCV= 77, Fe= 56 (sat=10%)... continue  Fe w/ VitC. ~  Labs 5/12 in hosp showed Hg= 12.3, MCV= 68, & she continues on FeSO4 daily w/ VitC... ~  Labs 2/13 showed Hg=13.1, MCV=78, Fe=66; rec to continue supplements... ~  Labs 3/14 showed Hg= 13.1 ~  Labs 9/14 showed Hg= 12.8, MCV=74 ~  Labs 1/15 showed Hg= 12.3, MCV=78 and rec to restart FeSO4 325mg  daily... ~  Labs 7/15 showed Hg= 13.2, MCV=78, Fe=73 (15%sat)... rec to continue supplements. ~  Labs 1/16 showed Hg= 13.2, MCV=77, and reminded to continue daily supplements.  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 ~  Immuniz:  She had TDAP 03/2008;  Pneumovax-23 in 05/2009;  Given yearly Flu vaccines...   Past Surgical History  Procedure Laterality Date  . Total hip arthroplasty Right 12/09    Dr Marlou Sa  . Coronary angioplasty with stent placement  04/2008    "1; Dr Terrence Dupont" (10/06/2012)  . Coronary angioplasty  10/06/2012  . Dilation and curettage of uterus    . Left heart catheterization  with coronary angiogram N/A 10/06/2012    Procedure: LEFT HEART CATHETERIZATION WITH CORONARY ANGIOGRAM;  Surgeon: Clent Demark, MD;  Location: [facility] CATH LAB;  Service: Cardiovascular;  Laterality: N/A;    Outpatient Encounter Prescriptions as of 07/27/2014  Medication Sig  . ALPRAZolam (XANAX) 0.25 MG tablet take 1 tablet by mouth three times a day if needed  . amiodarone (PACERONE) 200 MG tablet Take 200 mg by mouth daily.   [name] amLODipine (NORVASC) 5 MG tablet Take 5 mg by mouth daily.  [name] aspirin 81 MG tablet Take 81 mg by mouth daily.   . clopidogrel (PLAVIX) 75 MG tablet Take 1 tablet (75 mg total) by mouth daily with breakfast.  . ferrous gluconate (FERGON) 325 MG tablet Take 325 mg by mouth daily with breakfast. With vitamin C tablet   . Ipratropium-Albuterol (COMBIVENT RESPIMAT) 20-100 MCG/ACT AERS respimat Inhale 1 puff into the lungs every 6 (six) hours.  [name] losartan (COZAAR) 100 MG tablet take 1 tablet by mouth once daily  . nitroGLYCERIN (NITROSTAT) 0.4 MG SL tablet Place 0.4 mg under the tongue every 5 (five) minutes as needed. May repeat x3   . pantoprazole (PROTONIX) 40 MG tablet take 1 tablet by mouth twice a day  . rosuvastatin (CRESTOR) 20 MG tablet Take 20 mg by mouth daily.   . vitamin C (ASCORBIC ACID) 500 MG tablet Take 500 mg by mouth daily. With iron tablet   . Vitamin D, Ergocalciferol, (DRISDOL) 50000 UNITS CAPS capsule Take 1 capsule (50,000 Units total) by mouth once a week.  [name] acetaminophen (TYLENOL) 325 MG tablet Take 325 mg by mouth every 6 (six) hours as needed. For pain  . ferrous sulfate 325 (65 FE) MG tablet Take 1 tablet (325 mg total) by mouth daily with breakfast.    No Known Allergies    Immunization History  Administered Date(s) Administered  . Influenza Split 02/08/2011, 01/02/2012  . Influenza Whole 12/09/2007, 09/20/2009  . Influenza,inj,Quad PF,36+ Mos 12/21/2012, 03/23/2014  . Pneumococcal Polysaccharide-23 05/23/2009  . Tdap 03/04/2008     Current Medications, Allergies, Past Medical History, Past Surgical History, Family History, and Social History were reviewed in [employer] record.   Review of Systems         See HPI - all other systems neg except as noted... The patient complains of dyspnea on exertion.  The patient denies anorexia, fever,  weight loss, weight gain, vision loss, decreased hearing, hoarseness, chest pain, syncope, peripheral edema, prolonged cough, headaches, hemoptysis, abdominal pain, melena, hematochezia, severe indigestion/heartburn, hematuria, incontinence, muscle weakness, suspicious skin lesions, transient blindness, difficulty walking, depression, unusual weight change, abnormal bleeding, enlarged lymph nodes, and angioedema.     Objective:   Physical Exam     WD, WN, 81 y/o BF in NAD... GENERAL:  Alert & oriented; pleasant & cooperative... HEENT:  [place]/AT, EOM-wnl, EACs-clear, TMs-wnl, NOSE-clear, THROAT-clear & wnl. NECK:  Supple w/ fairROM; no JVD; normal carotid impulses w/o bruits; no thyromegaly or nodules palpated; no lymphadenopathy. CHEST:  Coarse BS w/ no wheezing, no rales, no signs of consolidation. HEART:  Regular Rhythm; gr 1/6 SEM without rubs or gallops detected... ABDOMEN:  Soft & nontender; normal bowel sounds; no organomegaly or masses palpated... EXT: without deformities, mod arthritic changes; s/p right THR; no varicose veins/ +venous insuffic/  tr edema . BACK:  sl tender along right PSIS area... NEURO:  CN's intact; no focal neuro defict... DERM:  no lesions seen...  RADIOLOGY DATA:  Reviewed in the EPIC EMR & discussed w/ the patient...  LABORATORY DATA:  Reviewed in the EPIC EMR & discussed w/ the patient...     Assessment & Plan:    GI>  Hx HH/ GERD/ Divertics/ Polyps>  She is followed by DrJacobs for GI as above; current symptoms sound like IBS=> try BENTYL20- 1/2 to 1 tab Qid as needed...    AB>  Improved overall & she stopped all meds  on her own=> using the COMBIVENT respimat prn but hasn't needed...  HBP>  Fair control on the Losartan & Amlodipine; asked to monitor BP at home...  ASHD>  Followed by [name] for Cards> cath 8/14 w/ LAD instent restenosis, s/p successful PCI & she is back to baseline; Note: ER 9/14 switched her Pepcid to Omep20Bid but her Plavix is critically important to help patency of the LAD stent; therefore we will switch the Omep to PANTOPRAZOLE40Bid...  AFlutter>  DrHarwani has her on Amio 200mg  daily; her prev thyroid problems have resolved...  CHOL>  On Cres20 w/ good control of FLP...  THYROID DISEASE>  Prev Amio induced hyperthyroidism managed by DrKumar (on PTU prev) & resolved on lower dose of Amio...  GU> mild renal insuffic is stable w/ Cr=1.7-1.6 on current meds...  DJD>  She is followed by DrDean for Ortho...  VitD defic>  She remains on VitD 50K weekly, reminded to take it every wk...  Anemia>  Hg is OK but MCV is low, Fe was 73 (improved) & she will continue her Fe & VitC supplements...  Anxiety>  Requesting a mild nerve pill & we wrote for Alprazolam...   Patient's Medications  New Prescriptions   DICYCLOMINE (BENTYL) 20 MG TABLET    Take 1/2-1 tablet by mouth up to 4 times a day as needed for abdominal cramping  Previous Medications   ACETAMINOPHEN (TYLENOL) 325 MG TABLET    Take 325 mg by mouth every 6 (six) hours as needed. For pain   ALPRAZOLAM (XANAX) 0.25 MG TABLET    take 1 tablet by mouth three times a day if needed   AMIODARONE (PACERONE) 200 MG TABLET    Take 200 mg by mouth daily.    AMLODIPINE (NORVASC) 5 MG TABLET    Take 5 mg by mouth daily.   ASPIRIN 81 MG TABLET    Take 81 mg by mouth daily.    CLOPIDOGREL (PLAVIX) 75 MG TABLET  Take 1 tablet (75 mg total) by mouth daily with breakfast.   FERROUS GLUCONATE (FERGON) 325 MG TABLET    Take 325 mg by mouth daily with breakfast. With vitamin C tablet    FERROUS SULFATE 325 (65 FE) MG TABLET    Take 1 tablet (325  mg total) by mouth daily with breakfast.   IPRATROPIUM-ALBUTEROL (COMBIVENT RESPIMAT) 20-100 MCG/ACT AERS RESPIMAT    Inhale 1 puff into the lungs every 6 (six) hours.   LOSARTAN (COZAAR) 100 MG TABLET    take 1 tablet by mouth once daily   NITROGLYCERIN (NITROSTAT) 0.4 MG SL TABLET    Place 0.4 mg under the tongue every 5 (five) minutes as needed. May repeat x3    PANTOPRAZOLE (PROTONIX) 40 MG TABLET    take 1 tablet by mouth twice a day   ROSUVASTATIN (CRESTOR) 20 MG TABLET    Take 20 mg by mouth daily.    VITAMIN C (ASCORBIC ACID) 500 MG TABLET    Take 500 mg by mouth daily. With iron tablet    VITAMIN D, ERGOCALCIFEROL, (DRISDOL) 50000 UNITS CAPS CAPSULE    Take 1 capsule (50,000 Units total) by mouth once a week.  Modified Medications   No medications on file  Discontinued Medications   No medications on file

## 2014-07-27 NOTE — Patient Instructions (Signed)
Today we updated your med list in our EPIC system...    Continue your current medications the same...  Try the new BENTYL (Dicyclomine) 20mg  tabs- take 1/2 to 1 tab up to 4 times daily as needed for abdominal cramping...  Call for any questions...  Let's plan a follow up visit in 4-17mo, sooner if needed for problems.Marland KitchenMarland Kitchen

## 2014-07-29 ENCOUNTER — Encounter (HOSPITAL_COMMUNITY)
Admission: RE | Admit: 2014-07-29 | Discharge: 2014-07-29 | Disposition: A | Discharge status: Home / Self Care | Payer: Self-pay | Source: Ambulatory Visit | Attending: Cardiology | Admitting: Cardiology

## 2014-07-30 DIAGNOSIS — K589 Irritable bowel syndrome without diarrhea: Secondary | ICD-10-CM | POA: Insufficient documentation

## 2014-08-03 ENCOUNTER — Encounter (HOSPITAL_COMMUNITY)
Admission: RE | Admit: 2014-08-03 | Discharge: 2014-08-03 | Disposition: A | Discharge status: Home / Self Care | Payer: Self-pay | Source: Ambulatory Visit | Attending: Cardiology | Admitting: Cardiology

## 2014-08-03 DIAGNOSIS — I252 Old myocardial infarction: Secondary | ICD-10-CM | POA: Insufficient documentation

## 2014-08-03 DIAGNOSIS — I4891 Unspecified atrial fibrillation: Secondary | ICD-10-CM | POA: Insufficient documentation

## 2014-08-03 DIAGNOSIS — Z5189 Encounter for other specified aftercare: Secondary | ICD-10-CM | POA: Insufficient documentation

## 2014-08-03 DIAGNOSIS — I2510 Atherosclerotic heart disease of native coronary artery without angina pectoris: Secondary | ICD-10-CM | POA: Insufficient documentation

## 2014-08-03 DIAGNOSIS — Z9861 Coronary angioplasty status: Secondary | ICD-10-CM | POA: Insufficient documentation

## 2014-08-05 ENCOUNTER — Encounter (HOSPITAL_COMMUNITY): Payer: Self-pay

## 2014-08-08 ENCOUNTER — Encounter (HOSPITAL_COMMUNITY): Payer: Self-pay

## 2014-08-10 ENCOUNTER — Encounter (HOSPITAL_COMMUNITY)
Admission: RE | Admit: 2014-08-10 | Discharge: 2014-08-10 | Disposition: A | Discharge status: Home / Self Care | Payer: Self-pay | Source: Ambulatory Visit | Attending: Cardiology | Admitting: Cardiology

## 2014-08-12 ENCOUNTER — Encounter (HOSPITAL_COMMUNITY)
Admission: RE | Admit: 2014-08-12 | Discharge: 2014-08-12 | Disposition: A | Discharge status: Home / Self Care | Payer: Self-pay | Source: Ambulatory Visit | Attending: Cardiology | Admitting: Cardiology

## 2014-08-15 ENCOUNTER — Encounter (HOSPITAL_COMMUNITY): Payer: Self-pay

## 2014-08-17 ENCOUNTER — Encounter (HOSPITAL_COMMUNITY)
Admission: RE | Admit: 2014-08-17 | Discharge: 2014-08-17 | Disposition: A | Discharge status: Home / Self Care | Payer: Self-pay | Source: Ambulatory Visit | Attending: Cardiology | Admitting: Cardiology

## 2014-08-19 ENCOUNTER — Encounter (HOSPITAL_COMMUNITY)
Admission: RE | Admit: 2014-08-19 | Discharge: 2014-08-19 | Disposition: A | Discharge status: Home / Self Care | Payer: Self-pay | Source: Ambulatory Visit | Attending: Cardiology | Admitting: Cardiology

## 2014-08-22 ENCOUNTER — Encounter (HOSPITAL_COMMUNITY)
Admission: RE | Admit: 2014-08-22 | Discharge: 2014-08-22 | Disposition: A | Discharge status: Home / Self Care | Payer: Self-pay | Source: Ambulatory Visit | Attending: Cardiology | Admitting: Cardiology

## 2014-08-24 ENCOUNTER — Encounter (HOSPITAL_COMMUNITY)
Admission: RE | Admit: 2014-08-24 | Discharge: 2014-08-24 | Disposition: A | Discharge status: Home / Self Care | Payer: Self-pay | Source: Ambulatory Visit | Attending: Cardiology | Admitting: Cardiology

## 2014-08-26 ENCOUNTER — Encounter (HOSPITAL_COMMUNITY): Payer: Self-pay

## 2014-08-29 ENCOUNTER — Encounter (HOSPITAL_COMMUNITY): Payer: Self-pay

## 2014-08-29 ENCOUNTER — Other Ambulatory Visit: Payer: Self-pay

## 2014-08-31 ENCOUNTER — Encounter (HOSPITAL_COMMUNITY)
Admission: RE | Admit: 2014-08-31 | Discharge: 2014-08-31 | Disposition: A | Discharge status: Home / Self Care | Payer: Self-pay | Source: Ambulatory Visit | Attending: Cardiology | Admitting: Cardiology

## 2014-09-02 ENCOUNTER — Encounter (HOSPITAL_COMMUNITY): Payer: Medicare Other

## 2014-09-02 DIAGNOSIS — I2510 Atherosclerotic heart disease of native coronary artery without angina pectoris: Secondary | ICD-10-CM | POA: Insufficient documentation

## 2014-09-02 DIAGNOSIS — I252 Old myocardial infarction: Secondary | ICD-10-CM | POA: Insufficient documentation

## 2014-09-02 DIAGNOSIS — Z5189 Encounter for other specified aftercare: Secondary | ICD-10-CM | POA: Insufficient documentation

## 2014-09-02 DIAGNOSIS — I4891 Unspecified atrial fibrillation: Secondary | ICD-10-CM | POA: Insufficient documentation

## 2014-09-02 DIAGNOSIS — Z9861 Coronary angioplasty status: Secondary | ICD-10-CM | POA: Insufficient documentation

## 2014-09-07 ENCOUNTER — Encounter (HOSPITAL_COMMUNITY): Payer: Self-pay

## 2014-09-09 ENCOUNTER — Encounter (HOSPITAL_COMMUNITY): Payer: Self-pay

## 2014-09-12 ENCOUNTER — Encounter (HOSPITAL_COMMUNITY): Payer: Self-pay

## 2014-09-14 ENCOUNTER — Encounter (HOSPITAL_COMMUNITY): Payer: Self-pay

## 2014-09-16 ENCOUNTER — Encounter (HOSPITAL_COMMUNITY): Payer: Self-pay

## 2014-09-19 ENCOUNTER — Encounter (HOSPITAL_COMMUNITY): Payer: Self-pay

## 2014-09-21 ENCOUNTER — Encounter (HOSPITAL_COMMUNITY): Payer: Self-pay

## 2014-09-23 ENCOUNTER — Encounter (HOSPITAL_COMMUNITY): Payer: Self-pay

## 2014-09-26 ENCOUNTER — Encounter (HOSPITAL_COMMUNITY): Payer: Self-pay

## 2014-09-27 ENCOUNTER — Ambulatory Visit (INDEPENDENT_AMBULATORY_CARE_PROVIDER_SITE_OTHER): Payer: Medicare Other | Admitting: Pulmonary Disease

## 2014-09-27 ENCOUNTER — Encounter: Payer: Self-pay | Admitting: Pulmonary Disease

## 2014-09-27 VITALS — BP 124/64 | HR 74 | Temp 97.1°F | Wt 159.4 lb

## 2014-09-27 DIAGNOSIS — F411 Generalized anxiety disorder: Secondary | ICD-10-CM

## 2014-09-27 DIAGNOSIS — I2510 Atherosclerotic heart disease of native coronary artery without angina pectoris: Secondary | ICD-10-CM

## 2014-09-27 DIAGNOSIS — K5730 Diverticulosis of large intestine without perforation or abscess without bleeding: Secondary | ICD-10-CM

## 2014-09-27 DIAGNOSIS — D126 Benign neoplasm of colon, unspecified: Secondary | ICD-10-CM

## 2014-09-27 DIAGNOSIS — M545 Low back pain, unspecified: Secondary | ICD-10-CM

## 2014-09-27 DIAGNOSIS — K449 Diaphragmatic hernia without obstruction or gangrene: Secondary | ICD-10-CM | POA: Diagnosis not present

## 2014-09-27 DIAGNOSIS — I4892 Unspecified atrial flutter: Secondary | ICD-10-CM | POA: Diagnosis not present

## 2014-09-27 DIAGNOSIS — I10 Essential (primary) hypertension: Secondary | ICD-10-CM

## 2014-09-27 DIAGNOSIS — E559 Vitamin D deficiency, unspecified: Secondary | ICD-10-CM

## 2014-09-27 DIAGNOSIS — M159 Polyosteoarthritis, unspecified: Secondary | ICD-10-CM

## 2014-09-27 DIAGNOSIS — K589 Irritable bowel syndrome without diarrhea: Secondary | ICD-10-CM

## 2014-09-27 DIAGNOSIS — M150 Primary generalized (osteo)arthritis: Secondary | ICD-10-CM

## 2014-09-27 NOTE — Patient Instructions (Signed)
Today we updated your med list in our EPIC system...    Continue your current medications the same...  Stay as active as possible, and be careful...  Call for any questions...  Let's plan a follow up visit in 27mo, sooner if needed for problems.Marland KitchenMarland Kitchen

## 2014-09-27 NOTE — Progress Notes (Signed)
HPI  Review of Systems  Physical Exam  Subjective:    Patient ID: Virginia Casey, female    DOB: Feb 01, 1925, 81 y.o.   MRN: [id number]  HPI 81 y/o BF here for a follow up visit... she has mult med problems as noted below...  ~  SEE PREV EPIC NOTES FOR OLDER DATA >>    CXR 7/13 showed normal heart size, largeHH, bibasilar atx, otherw clear...  EKG 7/13 showed SBrady, rate52, no acute changes...  Myoview 8/13 at [facility] showed no ischemia or infarction, normal wall motion, EF=70%...  LABS 7/13:  Chems- wnl;  CBC- wnl...   LABS 3/14:  FLP- at goals on Cres20;  Chems- wnl;  CBC- wnl;  TSH=2.11;  VitD=12 & Rec to start 50,000u weekly supplement...   CXR 7/14 showed cardiomeg, largeHH, NAD...   PFT's were attempted but pt absolutely could not do the simple spirometry...  LABS 7/14 showed Chems- wnl, CBC- ok x plat=113K  ~  March 23, 2013:  74mo ROV & Virginia Casey reports doing well, no new complaints or concerns...    Chol> on Cres20; FLP 1/15 showed TChol 133, TG 38, HDL 75, LDL 50- we don't have notes/ labs from [facility]....    Renal insuffic> on Amlod5, Losar100, Amio200; Labs 1/15 showed BUN=42, Cr=1.8.Marland KitchenMarland Kitchen She knows to avoid NSAIDs & incr fluids...    Anemia> Hg=12.3, MCV=78; needs to restart Fe daily & stay on this... [name] prob list, meds, xrays and labs> see below for updates >>   LABS 1/15:  FLP- at goals on Cres20;  Chems- ok x renal insuffic w/ BUN=42, Cr=1.8;  CBC- ok w/ Hg=12.3, MCV=78;  TSH=3.09;  VitD=50... REC to continue Cres20, avoid NSAIDs & incr fluids, start FeSO4 daily and continue VitD50K weekly...   ~  September 20, 2013:  43mo ROV & Virginia Casey noted diarrhea x3d last week for which she took some Pepto & now much improved... She reports doing well overall at age 74 w/o other complaints or concerns...  She's been doing Cardiac rehab & really likes it...    She continues regular f/u w/ Cards- DrHarwani on Amlod5, Losar100, Amio200, ASA.Plavix; BP= 130/80 & she denies CP, palpit,  SOB, edema, or cerebral ischemic symptoms...    She remains on Cres20 & FLP 1/15 looked great...     She is clinically & biochem euthyroid w/ TSH 1/15 = 3.09...    She has mild renal insuffic w/ Labs 7/15 showing BUN= 32, Cr= 1.7 which is stable since last Sep    Hx Anemia on Fe daily> Labs 7/15 showed Hg=13.2 (up 1pt), MCV=78, Fe=73 (15%sat) and rec to continue same meds & supplements... [name] prob list, meds, xrays and labs> see below for updates >>  REC to continue same meds and supplements including the Fe...  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     AB> on Combivent prn (hasn't needed in a long time); doing well, no resp infections or exacerbations...    HBP> on Losartan100 & Amlod5; BP= 132/86 & she denies CP, palpit, SOB, edema; rec to continue same meds + diet...    CAD> followed by [name] on ASA81, Plavix75; eval 7/14 w/ abn Myoview & subseq cath 8/14 showed in-stent restenosis in LAD, s/p PCI & back to baseline...    PAFlutter>  still on Amio200 per [facility]; she denies palpit, dizzy, ch in SOB, etc...    Cerebrovasc dis> on ASA81/ Plavix75; she denies cerebral ischemic symptoms...    Chol> on Cres20; FLP 1/16 shows TChol 154, TG 62, HDL 68, LDL 74; continue diet & Cres20...    Thyroid dis> followed by [name]; she has hx hyperthyroidism secondary to Amio & a prob multinod goiter; treated w/ Tapazole, then PTU; now off PTU & "everything is fine"; Labs 1/16 showed TSH= 2.31...    GI> HH, Divertics, Polyps> on Protonix40Bid to avoid Plavix interaction; doing satis w/o abd pain, n/v, c/d, blood seen...    Renal insuffic> on Amlod5, Losar100, Amio200; Labs 1/16 showed BUN=27, Cr=1.52 (improved)... She knows to avoid NSAIDs & incr fluids...    DJD, VitD defic> on Tylenol & VitD 50K per week since labs 3/14 showed VitD level  lower at 12; f/u VitD level 1/16= 28, therefore continue 50K weekly...    Anxiety> on Alpraz0.25 prn which really helps...    Anemia> on Fe w/ VitC; Labs 1/16 showed Hg=13.2, MCV=77, Fe= needs to be repeated; rec to continue supplements daily... [name] prob list, meds, xrays and labs> see below for updates >> Given 2015 Flu vaccine today, OK refills as requested.[name]  LABS 1/16:  FLP- within parameters on Cres20;  Chems- ok x Cr=1.52;  CBC- ok x MCV=77 & Plat=112;  TSH=2.31;  VitD=28... PLAN>> Continue same meds, refills given per request; rec to continue MVI, FeSO4 daily, & VitD 50K weekly...  ~  Jul 27, 2014:  46mo ROV & add-on appt requested for "stomach issues">  Virginia Casey is c/o "my divertics are acting up" over the last week;  Notes periumbil pain off & on, ?ppt events, lasts up to 1h, relief spont; she denies n/v, d/c/bl seen, no f/c/s; her ROS is otherw neg w/o CP, palpit, SOB, cough/ phlegm, etc...       GI> HH, Divertics, Polyps> on Protonix40Bid to avoid Plavix interaction; she has a giantHH measuring 10cm w/ erosions on last EGD 4/08 by DrSam; she was Hosp 11/10 w/ lower GIB believed due to divertics- colon by DrJacobs w/ mult divertics throughout & one adenomatous polyp removed from the sigmoid colon...       EXAM shows Afeb, VSS, O2sat=99% on RA;  HEENT- neg;  Chest- clear w/o w/r/r;  Cor- RR gr1/6SEM w/o r/g;  Abd- soft, nontender, w/o masses; Ext- neg w/o c/c/e... IMP/PLAN>>  Sounds like IBS & we discussed treating her cramping pain w/ BENTYL 20mg - 1/2 to 1 tab qid as needed; if symptoms persist we will request GI consultation...   ~  September 27, 2014:  13mo ROV & Virginia Casey reports much improved "can't complain she says... She continues to f/u w/ Cards- DrHarwani every 33mo; her GI complaints have resolved w/ the Bentyl & now back to baseline... She notes some arthritis pain but Tylenol helps... She is in good spirits!    EXAM shows Afeb, VSS, O2sat=96% on RA;  HEENT- neg;  Chest- clear w/o w/r/r;   Cor- RR gr1/6SEM w/o r/g;  Abd- soft, nontender, w/o masses; Ext- neg w/o c/c/e... [name] prob list, meds, xrays and labs>   IMP/PLAN>>  Stable on current med Rx; as noted she sees Cards Q23mo so we will plan ROV in 17mo, sooner if needed prn...          Problem List:  Hearing Loss >> she saw DrCrossley 3/13 w/ a high freq hearing loss & he removed creumen impactions' they  are holding off on hearing aides for now...  ASTHMATIC BRONCHITIS (ICD-466.0) - breathing is stable now- s/p recent exacerbation... ~  CXR 1/12 showed basilar atx & large HH, cardiomeg, osteopenia, NAD... ~  [facility] 5/12 w/ refractory AB exac & improved w/ standard therapy & brief course of Pred; CXR- similar, NAD.[name]. ~  CXR 7/13 showed norm heart size, large HH, clear lungs, NAD.[name]. ~  7/14:  She presented w/ a mild exac brought on by the stress of a trip to [place] etc=> Rx w/ COMBIVENT every 6h as needed... ~  CXR 7/14 showed cardiomeg, largeHH, NAD... We attempted PFTs but she was unable to perform... Os sats were 98% on RA... ~  She remains stable, denies breathing problems and hasn't needed the Combivent...  HYPERTENSION (ICD-401.9) - now on LOSARTAN 100mg /d & AMLODIPINE 5mg /d (and off her prev Ramipril due to asthma & Imdur due to HA) ...  ~  8/12:  BP= 110/72 & not checking BP's at home; she denies visual changes, CP, palipit, syncope, dyspnea, edema, etc... ~  2/13:  BP= 122/82 & she is stable, mostly asymptomatic w/o CP, palpit, SOB, etc... ~  8/13:  BP= 124/86 & she remains stable... ~  3/14: on Losartan100 & off Amlod5; BP= 130/84 & she denies CP, palpit, SOB, edema; rec to continue same meds + diet. ~  7/14: on Losartan100 & off Amlod5; BP= 132/92 & she denies CP, palpit, or edema... ~  10/14: now on Losartan100 + Amlodipine5; BP= 118/88 & she remains asymptomatic... ~  7/15: on Amlod5, Losar100, Amio200, ASA.Plavix; BP= 130/80 & she denies CP, palpit, SOB, edema, or cerebral ischemic symptoms. ~   1/16: on Losartan100 & Amlod5; BP= 132/86 & she denies CP, palpit, SOB, edema; rec to continue same meds + diet. ~  5/16: on Losartan100 & Amlod5; BP= 110/70 & she remains asymptomatic.  ATHEROSCLEROTIC HEART DISEASE (ICD-414.00) & PERIPHERAL VASCULAR DISEASE (ICD-443.9) - regularly on ASA 81mg /d, & off Plavix... followed by [facility] for Cards (we do not have his notes to review med changes)>> ~  hosp in Jan08 w/ syncope and MRI Brain showed mild atrophy & sm vessel dis;  MRA showed basilar art narrowing & mod stenosis in RICA cavernous segment... CDopplers however were WNL w/ antegrade vertebral flow, and no signif ICA stenoses detected... ~  hosp Feb10 by [facility] w/ non-STEMI- s/p PTCA and stent in the LAD. ~  hosp 7/11 by [facility] w/ CP, Abn Myoview & cath w/ 70% midLAD in-stent resteosis; s/p PTCA & started back on Plavix... ~  she is in Cardiac Rehab & really likes it... ~  7/13:  She went to the ER w/ chest discomfort; ruled out for ischemia (EKG= SBrday, rate52, wnl/NAD) & followed up w/ DrHarwani> he did MYOVIEW 8/13 which was neg for ischemia or infarction, norm wall motion & EF=70%... ~  7/14:  She continues to f/u w/ [facility] her cardiologist;  She will see him soon to f/u from this [place] trip for additional test => Myoview was Abn & Cath 8/14 w/ in-stent restenosis, s/p PCI... ~  10/14: now on ASA81 + Plavix75 and enrolled in cardiac rehab which she loves... (Note: Omep20Bid give via ER 9/14 is being changed to Protonix40Bid due to her Plavix Rx)... ~  She continues to f/u regularly w/ DrHarwani (we do not have notes from him...  ATRIAL FLUTTER, PAROXYSMAL (ICD-427.32) - this occured 2/10 following her cath, PTCA, stent... he diagnosed flutter w/ 2:1 block... currently taking AMIODARONE  200mg /d... ~  She remains in NSR & denies CP, palpit, SOB, etc...  HYPERCHOLESTEROLEMIA (ICD-272.0) - on CRESTOR 20mg /d... ~  [place] 11/08 showed TChol 161, TG 39, HDL 62, LDL 92... continue same  med. ~  [place] 5/09 showed TChol 209, TG 70, HDL 74, LDL 124... rec- better diet, get weight down! ~  FLP in hosp 2/10 showed TChol 130, TG 69, HDL 45, LDL 71 ~  she reports that [facility] does fasting labs... ~  FLP 7/11 in hosp showed TChol 105, TG 30, HDL 66, LDL 33 ~  FLP 2/12 on Cres20 showed TChol 97, TG 38, HDL 50, LDL 39... copy to Cards ?decr to 10mg ? ~  FLP 2/13 on Cres20 showed TChol 118, TG 42, HDL 60, LDL 50... Continue same. ~  [place] 3/14 on Cres20 showed TChol 128, TG 32, HDL 68, LDL 53  ~  FLP 1/15 on Cres20 showed TChol 133, TG 38, HDL 75, LDL 50 ~  FLP 1/16 on Cres20 showed TChol 154, TG 62, HDL 68, LDL 74  THYROID DISEASE >> Hx Amiodarone induced hyperthyroidism diagnosed 2/12 & referred to [facility], treated w/ PTU (now off) & DrHarwani tapered the Amio... ~  1/13:  She had f/u DrKumar> everything improve4d w/ reduction in Amio dose; clinically euthyroid & no goiter palpated; TFT's in his office were WNL==> TSH=3.74,  FreeT3=2.4 (2.0-4.4), FreeT4=0.91 (0.61-1.12) ~  Labs 3/14 showed TSH= 2.11 ~  Labs 1/15 showed TSH= 3.09 ~  Labs 1/16 showed TSH= 2.31  HIATAL HERNIA (ICD-553.3) - prev on Nexium & Carafate Liq- but changed to PEPCID 20mg Bid by [facility] due to need for Plavix... she has a giant HH measuring 10cm per DrSam, w/ erosions in the pouch... last EGD by DrSam 4/08... hernia is seen on her CXRs. ~  9/14: she went to the ER w/ Epig pain & they felt it was reflux related due to her [place]; they switched Pepcid to Omep20Bid but she will need to change this to [place] due to the Plavix...  DIVERTICULOSIS OF COLON (ICD-562.10) & COLONIC POLYPS (ICD-211.3) ~  colonoscopy 11/06 showed divertics and 2- 54mm polyps... f/u planned in 4-39yrs... ~  Hosp 11/10 w/ lower GIB believed due to divertics... colonoscopy by DrJacobs showed severe divertics, no bleeding site, 72mm polyp (tubular adenoma) removed from sigmoid...  RENAL INSUFFICIENCY >> she knows to avoid NSAIDs and incr  fluid intake... ~  Labs 1/15 showed BUN= 42, Cr= 1.8 ~  Labs 7/15 showed BUN= 32, Cr= 1.7 ~  Labs 1/16 showed BUN= 27, Cr= 1.52  DEGENERATIVE JOINT DISEASE (ICD-715.90) & HIP PAIN, RIGHT (ICD-719.45) - currently taking OTC meds only but DrDean rec Vicodin for as needed use (she prefers Tylenol)... Eval by DrDean 3/09 w/ bone-on-bone right hip, tried injection & pain meds... right THR done 12/09...  VITAMIN D DEFICIENCY (ICD-268.9) ~  labs 5/09 showed Vit D level = 9... pt started on 50K weekly but she switched on her own to 1000u/d OTC. ~  labs 2/12 showed Vit D level = 18... asked to incr Vit D supplement to 2000 u daily (she never did). ~  Labs 2/13 showed Vit D level = 20... Asked to finally incr VitD supplement to 2000u daily... ~  Labs 3/14 showed Vit D level = 12 & Rec to switch to 50K weekly Rx... ~  Labs 1/15 showed VitD level = 50 & rec to continue VitD 50K weekly... ~  Labs 1/16 showed VitD level = 28 & reminded to take the VitD  50K weekly...  ANXIETY (ICD-300.00) - she is requesting a mild nerve pill & we have written ALPRAZOLAM 0.25mg  Prn use...  ANEMIA (ICD-285.9) - prev on Niferex 150mg /d, now FeSO4 325mg /d w/ Vit C 500mg ... 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 ~  last blood count 11/08 showed Hg=13.7, Fe=56... ~  labs 5/09 showed Hg= 14.2.[name]. ~  labs 2/10 in hosp showed Hg= 13 ~  Hosp 11/10 w/ lower GIB, & Hg down to 8.6- tc 1u PC up to 9.3.Marland KitchenMarland Kitchen ~  labs 12/10 showed Hg= 10.6.[name]. rec> continue oral Fe w/ VitC... ~  [facility] labs 7/11 showed Hg= 11.6 - 10.5, MCV= 70... rec> restart Fe. ~  labs 2/12 showed Hg= 12.4, MCV= 77, Fe= 56 (sat=10%)... continue Fe w/ VitC. ~  Labs 5/12 in hosp showed Hg= 12.3, MCV= 68, & she continues on FeSO4 daily w/ VitC... ~  Labs 2/13 showed Hg=13.1, MCV=78, Fe=66; rec to continue supplements... ~  Labs 3/14 showed Hg= 13.1 ~  Labs 9/14 showed Hg= 12.8, MCV=74 ~  Labs 1/15 showed Hg= 12.3, MCV=78 and rec to restart FeSO4 325mg  daily... ~   Labs 7/15 showed Hg= 13.2, MCV=78, Fe=73 (15%sat)... rec to continue supplements. ~  Labs 1/16 showed Hg= 13.2, MCV=77, and reminded to continue daily supplements.  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 ~  Immuniz:  She had TDAP 03/2008;  Pneumovax-23 in 05/2009;  Given yearly Flu vaccines...   Past Surgical History  Procedure Laterality Date  . Total hip arthroplasty Right 12/09    Dr Marlou Sa  . Coronary angioplasty with stent placement  04/2008    "1; Dr Terrence Dupont" (10/06/2012)  . Coronary angioplasty  10/06/2012  . Dilation and curettage of uterus    . Left heart catheterization with coronary angiogram N/A 10/06/2012    Procedure: LEFT HEART CATHETERIZATION WITH CORONARY ANGIOGRAM;  Surgeon: Clent Demark, MD;  Location: [facility] CATH LAB;  Service: Cardiovascular;  Laterality: N/A;    Outpatient Encounter Prescriptions as of 09/27/2014  Medication Sig  . acetaminophen (TYLENOL) 325 MG tablet Take 325 mg by mouth every 6 (six) hours as needed. For pain  . ALPRAZolam (XANAX) 0.25 MG tablet take 1 tablet by mouth three times a day if needed  . amiodarone (PACERONE) 200 MG tablet Take 200 mg by mouth daily.   [name] amLODipine (NORVASC) 5 MG tablet Take 5 mg by mouth daily.  [name] aspirin 81 MG tablet Take 81 mg by mouth daily.   . clopidogrel (PLAVIX) 75 MG tablet Take 1 tablet (75 mg total) by mouth daily with breakfast.  . dicyclomine (BENTYL) 20 MG tablet Take 1/2-1 tablet by mouth up to 4 times a day as needed for abdominal cramping  . ferrous sulfate 325 (65 FE) MG tablet Take 1 tablet (325 mg total) by mouth daily with breakfast.  . Ipratropium-Albuterol (COMBIVENT RESPIMAT) 20-100 MCG/ACT AERS respimat Inhale 1 puff into the lungs every 6 (six) hours.  [name] losartan (COZAAR) 100 MG tablet take 1 tablet by mouth once daily  . nitroGLYCERIN (NITROSTAT) 0.4 MG SL tablet Place 0.4 mg under the tongue every 5 (five) minutes as needed. May  repeat x3   . pantoprazole (PROTONIX) 40 MG tablet take 1 tablet by mouth twice a day  . rosuvastatin (CRESTOR) 20 MG tablet Take 20 mg by mouth daily.   . vitamin C (ASCORBIC ACID) 500 MG tablet Take 500 mg  by mouth daily. With iron tablet   . Vitamin D, Ergocalciferol, (DRISDOL) 50000 UNITS CAPS capsule Take 1 capsule (50,000 Units total) by mouth once a week.  . ferrous gluconate (FERGON) 325 MG tablet Take 325 mg by mouth daily with breakfast. With vitamin C tablet   . levofloxacin (LEVAQUIN) 500 MG tablet Take 1 tablet (500 mg total) by mouth daily. (Patient not taking: Reported on 07/27/2014)  . predniSONE (STERAPRED UNI-PAK) 5 MG TABS tablet Take 1 tablet (5 mg total) by mouth daily. (Patient not taking: Reported on 07/27/2014)   No facility-administered encounter medications on file as of 09/27/2014.    No Known Allergies    Immunization History  Administered Date(s) Administered  . Influenza Split 02/08/2011, 01/02/2012  . Influenza Whole 12/09/2007, 09/20/2009  . Influenza,inj,Quad PF,36+ Mos 12/21/2012, 03/23/2014  . Pneumococcal Polysaccharide-23 05/23/2009  . Tdap 03/04/2008    Current Medications, Allergies, Past Medical History, Past Surgical History, Family History, and Social History were reviewed in [employer] record.   Review of Systems         See HPI - all other systems neg except as noted... The patient complains of dyspnea on exertion.  The patient denies anorexia, fever, weight loss, weight gain, vision loss, decreased hearing, hoarseness, chest pain, syncope, peripheral edema, prolonged cough, headaches, hemoptysis, abdominal pain, melena, hematochezia, severe indigestion/heartburn, hematuria, incontinence, muscle weakness, suspicious skin lesions, transient blindness, difficulty walking, depression, unusual weight change, abnormal bleeding, enlarged lymph nodes, and angioedema.     Objective:   Physical Exam     WD, WN, 81 y/o BF in  NAD... GENERAL:  Alert & oriented; pleasant & cooperative... HEENT:  /AT, EOM-wnl, EACs-clear, TMs-wnl, NOSE-clear, THROAT-clear & wnl. NECK:  Supple w/ fairROM; no JVD; normal carotid impulses w/o bruits; no thyromegaly or nodules palpated; no lymphadenopathy. CHEST:  Coarse BS w/ no wheezing, no rales, no signs of consolidation. HEART:  Regular Rhythm; gr 1/6 SEM without rubs or gallops detected... ABDOMEN:  Soft & nontender; normal bowel sounds; no organomegaly or masses palpated... EXT: without deformities, mod arthritic changes; s/p right THR; no varicose veins/ +venous insuffic/  tr edema . BACK:  sl tender along right PSIS area... NEURO:  CN's intact; no focal neuro defict... DERM:  no lesions seen...  RADIOLOGY DATA:  Reviewed in the EPIC EMR & discussed w/ the patient...  LABORATORY DATA:  Reviewed in the EPIC EMR & discussed w/ the patient...     Assessment & Plan:    AB>  Improved overall & she stopped all meds on her own=> using the COMBIVENT respimat prn but hasn't needed...  HBP>  Fair control on the Losartan & Amlodipine; asked to monitor BP at home...  ASHD>  Followed by [name] for Cards> cath 8/14 w/ LAD instent restenosis, s/p successful PCI & she is back to baseline; Note: ER 9/14 switched her Pepcid to Omep20Bid but her Plavix is critically important to help patency of the LAD stent; therefore we will switch the Omep to PANTOPRAZOLE40Bid...  AFlutter>  DrHarwani has her on Amio 200mg  daily; her prev thyroid problems have resolved...  CHOL>  On Cres20 w/ good control of FLP...  THYROID DISEASE>  Prev Amio induced hyperthyroidism managed by DrKumar (on PTU prev) & resolved on lower dose of Amio...  GI>  Hx HH/ GERD/ Divertics/ Polyps>  She is followed by DrJacobs for GI as above; current symptoms sound like IBS=> BENTYL20- 1/2 to 1 tab Qid helped...   GU> mild renal insuffic  is stable w/ Cr=1.7-1.6 on current meds...  DJD>  She is followed by DrDean for  Ortho...  VitD defic>  She remains on VitD 50K weekly, reminded to take it every wk...  Anemia>  Hg is OK but MCV is low, Fe was 73 (improved) & she will continue her Fe & VitC supplements...  Anxiety>  Requesting a mild nerve pill & we wrote for Alprazolam...   Patient's Medications  New Prescriptions   No medications on file  Previous Medications   ACETAMINOPHEN (TYLENOL) 325 MG TABLET    Take 325 mg by mouth every 6 (six) hours as needed. For pain   ALPRAZOLAM (XANAX) 0.25 MG TABLET    take 1 tablet by mouth three times a day if needed   AMIODARONE (PACERONE) 200 MG TABLET    Take 200 mg by mouth daily.    AMLODIPINE (NORVASC) 5 MG TABLET    Take 5 mg by mouth daily.   ASPIRIN 81 MG TABLET    Take 81 mg by mouth daily.    CLOPIDOGREL (PLAVIX) 75 MG TABLET    Take 1 tablet (75 mg total) by mouth daily with breakfast.   DICYCLOMINE (BENTYL) 20 MG TABLET    Take 1/2-1 tablet by mouth up to 4 times a day as needed for abdominal cramping   FERROUS GLUCONATE (FERGON) 325 MG TABLET    Take 325 mg by mouth daily with breakfast. With vitamin C tablet    FERROUS SULFATE 325 (65 FE) MG TABLET    Take 1 tablet (325 mg total) by mouth daily with breakfast.   IPRATROPIUM-ALBUTEROL (COMBIVENT RESPIMAT) 20-100 MCG/ACT AERS RESPIMAT    Inhale 1 puff into the lungs every 6 (six) hours.   LEVOFLOXACIN (LEVAQUIN) 500 MG TABLET    Take 1 tablet (500 mg total) by mouth daily.   LOSARTAN (COZAAR) 100 MG TABLET    take 1 tablet by mouth once daily   NITROGLYCERIN (NITROSTAT) 0.4 MG SL TABLET    Place 0.4 mg under the tongue every 5 (five) minutes as needed. May repeat x3    PANTOPRAZOLE (PROTONIX) 40 MG TABLET    take 1 tablet by mouth twice a day   PREDNISONE (STERAPRED UNI-PAK) 5 MG TABS TABLET    Take 1 tablet (5 mg total) by mouth daily.   ROSUVASTATIN (CRESTOR) 20 MG TABLET    Take 20 mg by mouth daily.    VITAMIN C (ASCORBIC ACID) 500 MG TABLET    Take 500 mg by mouth daily. With iron tablet     VITAMIN D, ERGOCALCIFEROL, (DRISDOL) 50000 UNITS CAPS CAPSULE    Take 1 capsule (50,000 Units total) by mouth once a week.  Modified Medications   No medications on file  Discontinued Medications   No medications on file

## 2014-09-28 ENCOUNTER — Encounter (HOSPITAL_COMMUNITY)
Admission: RE | Admit: 2014-09-28 | Discharge: 2014-09-28 | Disposition: A | Discharge status: Home / Self Care | Payer: Self-pay | Source: Ambulatory Visit | Attending: Cardiology | Admitting: Cardiology

## 2014-09-29 DIAGNOSIS — I252 Old myocardial infarction: Secondary | ICD-10-CM | POA: Diagnosis not present

## 2014-09-29 DIAGNOSIS — I10 Essential (primary) hypertension: Secondary | ICD-10-CM | POA: Diagnosis not present

## 2014-09-29 DIAGNOSIS — I480 Paroxysmal atrial fibrillation: Secondary | ICD-10-CM | POA: Diagnosis not present

## 2014-09-29 DIAGNOSIS — N189 Chronic kidney disease, unspecified: Secondary | ICD-10-CM | POA: Diagnosis not present

## 2014-09-29 DIAGNOSIS — I2510 Atherosclerotic heart disease of native coronary artery without angina pectoris: Secondary | ICD-10-CM | POA: Diagnosis not present

## 2014-09-30 ENCOUNTER — Encounter (HOSPITAL_COMMUNITY): Payer: Self-pay

## 2014-10-03 ENCOUNTER — Encounter (HOSPITAL_COMMUNITY): Payer: Medicare Other

## 2014-10-03 DIAGNOSIS — Z5189 Encounter for other specified aftercare: Secondary | ICD-10-CM | POA: Insufficient documentation

## 2014-10-03 DIAGNOSIS — I2510 Atherosclerotic heart disease of native coronary artery without angina pectoris: Secondary | ICD-10-CM | POA: Insufficient documentation

## 2014-10-03 DIAGNOSIS — Z9861 Coronary angioplasty status: Secondary | ICD-10-CM | POA: Insufficient documentation

## 2014-10-03 DIAGNOSIS — I4891 Unspecified atrial fibrillation: Secondary | ICD-10-CM | POA: Insufficient documentation

## 2014-10-03 DIAGNOSIS — I252 Old myocardial infarction: Secondary | ICD-10-CM | POA: Insufficient documentation

## 2014-10-05 ENCOUNTER — Encounter (HOSPITAL_COMMUNITY): Payer: Self-pay

## 2014-10-07 ENCOUNTER — Encounter (HOSPITAL_COMMUNITY): Payer: Self-pay

## 2014-10-10 ENCOUNTER — Encounter (HOSPITAL_COMMUNITY): Payer: Self-pay

## 2014-10-12 ENCOUNTER — Encounter (HOSPITAL_COMMUNITY): Payer: Self-pay

## 2014-10-14 ENCOUNTER — Encounter (HOSPITAL_COMMUNITY): Payer: Self-pay

## 2014-10-17 ENCOUNTER — Encounter (HOSPITAL_COMMUNITY): Payer: Self-pay

## 2014-10-19 ENCOUNTER — Encounter (HOSPITAL_COMMUNITY): Payer: Self-pay

## 2014-10-21 ENCOUNTER — Encounter (HOSPITAL_COMMUNITY): Payer: Self-pay

## 2014-10-24 ENCOUNTER — Encounter (HOSPITAL_COMMUNITY)
Admission: RE | Admit: 2014-10-24 | Discharge: 2014-10-24 | Disposition: A | Discharge status: Home / Self Care | Payer: Self-pay | Source: Ambulatory Visit | Attending: Cardiology | Admitting: Cardiology

## 2014-10-26 ENCOUNTER — Encounter (HOSPITAL_COMMUNITY): Payer: Self-pay

## 2014-10-28 ENCOUNTER — Encounter (HOSPITAL_COMMUNITY)
Admission: RE | Admit: 2014-10-28 | Discharge: 2014-10-28 | Disposition: A | Discharge status: Home / Self Care | Payer: Self-pay | Source: Ambulatory Visit | Attending: Cardiology | Admitting: Cardiology

## 2014-10-31 ENCOUNTER — Encounter (HOSPITAL_COMMUNITY): Payer: Self-pay

## 2014-11-02 ENCOUNTER — Encounter (HOSPITAL_COMMUNITY)
Admission: RE | Admit: 2014-11-02 | Discharge: 2014-11-02 | Disposition: A | Discharge status: Home / Self Care | Payer: Self-pay | Source: Ambulatory Visit | Attending: Cardiology | Admitting: Cardiology

## 2014-11-09 ENCOUNTER — Encounter (HOSPITAL_COMMUNITY): Payer: Self-pay

## 2014-11-09 DIAGNOSIS — Z48812 Encounter for surgical aftercare following surgery on the circulatory system: Secondary | ICD-10-CM | POA: Insufficient documentation

## 2014-11-09 DIAGNOSIS — Z955 Presence of coronary angioplasty implant and graft: Secondary | ICD-10-CM | POA: Insufficient documentation

## 2014-11-11 ENCOUNTER — Encounter (HOSPITAL_COMMUNITY): Payer: Self-pay

## 2014-11-14 ENCOUNTER — Encounter (HOSPITAL_COMMUNITY): Payer: Self-pay

## 2014-11-16 ENCOUNTER — Encounter (HOSPITAL_COMMUNITY)
Admission: RE | Admit: 2014-11-16 | Discharge: 2014-11-16 | Disposition: A | Discharge status: Home / Self Care | Payer: Self-pay | Source: Ambulatory Visit | Attending: Cardiology | Admitting: Cardiology

## 2014-11-18 ENCOUNTER — Encounter (HOSPITAL_COMMUNITY)
Admission: RE | Admit: 2014-11-18 | Discharge: 2014-11-18 | Disposition: A | Discharge status: Home / Self Care | Payer: Self-pay | Source: Ambulatory Visit | Attending: Cardiology | Admitting: Cardiology

## 2014-11-21 ENCOUNTER — Encounter (HOSPITAL_COMMUNITY)
Admission: RE | Admit: 2014-11-21 | Discharge: 2014-11-21 | Disposition: A | Discharge status: Home / Self Care | Payer: Self-pay | Source: Ambulatory Visit | Attending: Cardiology | Admitting: Cardiology

## 2014-11-23 ENCOUNTER — Encounter (HOSPITAL_COMMUNITY)
Admission: RE | Admit: 2014-11-23 | Discharge: 2014-11-23 | Disposition: A | Discharge status: Home / Self Care | Payer: Self-pay | Source: Ambulatory Visit | Attending: Cardiology | Admitting: Cardiology

## 2014-11-24 ENCOUNTER — Telehealth: Payer: Self-pay | Admitting: Pulmonary Disease

## 2014-11-24 MED ORDER — ALPRAZOLAM 0.25 MG PO TABS: ORAL_TABLET | ORAL | Status: DC

## 2014-11-24 NOTE — Telephone Encounter (Signed)
Pt is requesting refill on alprazolam 0.5 Last refilled 03-23-14 #90 x 5 refills take 1 tablet by mouth three times a day if needed  Please advise Dr. Lenna Gilford thanks

## 2014-11-24 NOTE — Telephone Encounter (Signed)
Pt aware.

## 2014-11-24 NOTE — Telephone Encounter (Signed)
lmtcb X1 for pt to make aware.  rx has been called in to pharmacy

## 2014-11-24 NOTE — Telephone Encounter (Signed)
Per SN, ok to refill with 1 additional refill Pt is on 0.25mg  xanax, not 0.5mg 

## 2014-11-25 ENCOUNTER — Encounter (HOSPITAL_COMMUNITY): Payer: Self-pay

## 2014-11-28 ENCOUNTER — Encounter (HOSPITAL_COMMUNITY)
Admission: RE | Admit: 2014-11-28 | Discharge: 2014-11-28 | Disposition: A | Discharge status: Home / Self Care | Payer: Self-pay | Source: Ambulatory Visit | Attending: Cardiology | Admitting: Cardiology

## 2014-11-30 ENCOUNTER — Encounter (HOSPITAL_COMMUNITY)
Admission: RE | Admit: 2014-11-30 | Discharge: 2014-11-30 | Disposition: A | Discharge status: Home / Self Care | Payer: Self-pay | Source: Ambulatory Visit | Attending: Cardiology | Admitting: Cardiology

## 2014-12-02 ENCOUNTER — Encounter (HOSPITAL_COMMUNITY): Payer: Self-pay

## 2014-12-05 ENCOUNTER — Encounter (HOSPITAL_COMMUNITY): Payer: Medicare Other

## 2014-12-05 DIAGNOSIS — Z955 Presence of coronary angioplasty implant and graft: Secondary | ICD-10-CM | POA: Insufficient documentation

## 2014-12-05 DIAGNOSIS — Z48812 Encounter for surgical aftercare following surgery on the circulatory system: Secondary | ICD-10-CM | POA: Insufficient documentation

## 2014-12-07 ENCOUNTER — Encounter (HOSPITAL_COMMUNITY): Payer: Self-pay

## 2014-12-09 ENCOUNTER — Encounter (HOSPITAL_COMMUNITY): Payer: Self-pay

## 2014-12-12 ENCOUNTER — Encounter (HOSPITAL_COMMUNITY): Payer: Self-pay

## 2014-12-14 ENCOUNTER — Encounter (HOSPITAL_COMMUNITY)
Admission: RE | Admit: 2014-12-14 | Discharge: 2014-12-14 | Disposition: A | Discharge status: Home / Self Care | Payer: Self-pay | Source: Ambulatory Visit | Attending: Cardiology | Admitting: Cardiology

## 2014-12-16 ENCOUNTER — Encounter (HOSPITAL_COMMUNITY): Payer: Self-pay

## 2014-12-19 ENCOUNTER — Encounter (HOSPITAL_COMMUNITY): Payer: Self-pay

## 2014-12-21 ENCOUNTER — Encounter (HOSPITAL_COMMUNITY)
Admission: RE | Admit: 2014-12-21 | Discharge: 2014-12-21 | Disposition: A | Discharge status: Home / Self Care | Payer: Self-pay | Source: Ambulatory Visit | Attending: Cardiology | Admitting: Cardiology

## 2014-12-23 ENCOUNTER — Encounter (HOSPITAL_COMMUNITY): Payer: Self-pay

## 2014-12-26 ENCOUNTER — Other Ambulatory Visit: Payer: Self-pay | Admitting: Pulmonary Disease

## 2014-12-26 ENCOUNTER — Encounter (HOSPITAL_COMMUNITY)
Admission: RE | Admit: 2014-12-26 | Discharge: 2014-12-26 | Disposition: A | Discharge status: Home / Self Care | Payer: Self-pay | Source: Ambulatory Visit | Attending: Cardiology | Admitting: Cardiology

## 2014-12-26 DIAGNOSIS — E785 Hyperlipidemia, unspecified: Secondary | ICD-10-CM | POA: Diagnosis not present

## 2014-12-26 DIAGNOSIS — I10 Essential (primary) hypertension: Secondary | ICD-10-CM | POA: Diagnosis not present

## 2014-12-26 DIAGNOSIS — I480 Paroxysmal atrial fibrillation: Secondary | ICD-10-CM | POA: Diagnosis not present

## 2014-12-28 ENCOUNTER — Encounter (HOSPITAL_COMMUNITY): Payer: Self-pay

## 2014-12-30 ENCOUNTER — Encounter (HOSPITAL_COMMUNITY): Payer: Self-pay

## 2015-01-02 ENCOUNTER — Encounter (HOSPITAL_COMMUNITY): Payer: Self-pay

## 2015-01-04 ENCOUNTER — Encounter (HOSPITAL_COMMUNITY): Payer: Medicare Other

## 2015-01-04 DIAGNOSIS — Z48812 Encounter for surgical aftercare following surgery on the circulatory system: Secondary | ICD-10-CM | POA: Insufficient documentation

## 2015-01-04 DIAGNOSIS — Z955 Presence of coronary angioplasty implant and graft: Secondary | ICD-10-CM | POA: Insufficient documentation

## 2015-01-06 ENCOUNTER — Encounter (HOSPITAL_COMMUNITY): Payer: Self-pay

## 2015-01-06 DIAGNOSIS — I252 Old myocardial infarction: Secondary | ICD-10-CM | POA: Diagnosis not present

## 2015-01-06 DIAGNOSIS — I480 Paroxysmal atrial fibrillation: Secondary | ICD-10-CM | POA: Diagnosis not present

## 2015-01-06 DIAGNOSIS — N189 Chronic kidney disease, unspecified: Secondary | ICD-10-CM | POA: Diagnosis not present

## 2015-01-06 DIAGNOSIS — I129 Hypertensive chronic kidney disease with stage 1 through stage 4 chronic kidney disease, or unspecified chronic kidney disease: Secondary | ICD-10-CM | POA: Diagnosis not present

## 2015-01-06 DIAGNOSIS — I2510 Atherosclerotic heart disease of native coronary artery without angina pectoris: Secondary | ICD-10-CM | POA: Diagnosis not present

## 2015-01-09 ENCOUNTER — Encounter (HOSPITAL_COMMUNITY): Payer: Self-pay

## 2015-01-11 ENCOUNTER — Encounter (HOSPITAL_COMMUNITY): Payer: Self-pay

## 2015-01-13 ENCOUNTER — Encounter (HOSPITAL_COMMUNITY): Payer: Self-pay

## 2015-01-16 ENCOUNTER — Encounter (HOSPITAL_COMMUNITY)
Admission: RE | Admit: 2015-01-16 | Discharge: 2015-01-16 | Disposition: A | Discharge status: Home / Self Care | Payer: Self-pay | Source: Ambulatory Visit | Attending: Cardiology | Admitting: Cardiology

## 2015-01-18 ENCOUNTER — Encounter (HOSPITAL_COMMUNITY): Payer: Self-pay

## 2015-01-20 ENCOUNTER — Encounter (HOSPITAL_COMMUNITY): Payer: Self-pay

## 2015-01-23 ENCOUNTER — Encounter (HOSPITAL_COMMUNITY): Payer: Self-pay

## 2015-01-25 ENCOUNTER — Encounter (HOSPITAL_COMMUNITY): Payer: Self-pay

## 2015-01-30 ENCOUNTER — Encounter (HOSPITAL_COMMUNITY): Payer: Self-pay

## 2015-02-01 ENCOUNTER — Encounter (HOSPITAL_COMMUNITY): Payer: Self-pay

## 2015-02-03 ENCOUNTER — Encounter (HOSPITAL_COMMUNITY): Payer: Self-pay

## 2015-02-03 DIAGNOSIS — Z48812 Encounter for surgical aftercare following surgery on the circulatory system: Secondary | ICD-10-CM | POA: Insufficient documentation

## 2015-02-03 DIAGNOSIS — Z955 Presence of coronary angioplasty implant and graft: Secondary | ICD-10-CM | POA: Insufficient documentation

## 2015-02-06 ENCOUNTER — Encounter (HOSPITAL_COMMUNITY)
Admission: RE | Admit: 2015-02-06 | Discharge: 2015-02-06 | Disposition: A | Discharge status: Home / Self Care | Payer: Self-pay | Source: Ambulatory Visit | Attending: Cardiology | Admitting: Cardiology

## 2015-02-08 ENCOUNTER — Encounter (HOSPITAL_COMMUNITY)
Admission: RE | Admit: 2015-02-08 | Discharge: 2015-02-08 | Disposition: A | Discharge status: Home / Self Care | Payer: Self-pay | Source: Ambulatory Visit | Attending: Cardiology | Admitting: Cardiology

## 2015-02-10 ENCOUNTER — Encounter (HOSPITAL_COMMUNITY): Payer: Self-pay

## 2015-02-13 ENCOUNTER — Encounter (HOSPITAL_COMMUNITY): Payer: Self-pay

## 2015-02-15 ENCOUNTER — Encounter (HOSPITAL_COMMUNITY): Payer: Self-pay

## 2015-02-17 ENCOUNTER — Encounter (HOSPITAL_COMMUNITY): Payer: Self-pay

## 2015-02-20 ENCOUNTER — Encounter (HOSPITAL_COMMUNITY): Payer: Self-pay

## 2015-02-22 ENCOUNTER — Encounter (HOSPITAL_COMMUNITY): Payer: Self-pay

## 2015-02-24 ENCOUNTER — Encounter (HOSPITAL_COMMUNITY)
Admission: RE | Admit: 2015-02-24 | Discharge: 2015-02-24 | Disposition: A | Discharge status: Home / Self Care | Payer: Self-pay | Source: Ambulatory Visit | Attending: Cardiology | Admitting: Cardiology

## 2015-02-27 ENCOUNTER — Other Ambulatory Visit: Payer: Self-pay | Admitting: Pulmonary Disease

## 2015-03-01 ENCOUNTER — Encounter (HOSPITAL_COMMUNITY): Payer: Self-pay

## 2015-03-03 ENCOUNTER — Encounter (HOSPITAL_COMMUNITY)
Admission: RE | Admit: 2015-03-03 | Discharge: 2015-03-03 | Disposition: A | Discharge status: Home / Self Care | Payer: Self-pay | Source: Ambulatory Visit | Attending: Cardiology | Admitting: Cardiology

## 2015-03-08 ENCOUNTER — Encounter (HOSPITAL_COMMUNITY)
Admission: RE | Admit: 2015-03-08 | Discharge: 2015-03-08 | Disposition: A | Discharge status: Home / Self Care | Payer: Self-pay | Source: Ambulatory Visit | Attending: Cardiology | Admitting: Cardiology

## 2015-03-08 DIAGNOSIS — Z955 Presence of coronary angioplasty implant and graft: Secondary | ICD-10-CM | POA: Insufficient documentation

## 2015-03-08 DIAGNOSIS — Z48812 Encounter for surgical aftercare following surgery on the circulatory system: Secondary | ICD-10-CM | POA: Insufficient documentation

## 2015-03-10 ENCOUNTER — Encounter (HOSPITAL_COMMUNITY)
Admission: RE | Admit: 2015-03-10 | Discharge: 2015-03-10 | Disposition: A | Discharge status: Home / Self Care | Payer: Self-pay | Source: Ambulatory Visit | Attending: Cardiology | Admitting: Cardiology

## 2015-03-13 ENCOUNTER — Encounter (HOSPITAL_COMMUNITY): Payer: Self-pay

## 2015-03-15 ENCOUNTER — Encounter (HOSPITAL_COMMUNITY)
Admission: RE | Admit: 2015-03-15 | Discharge: 2015-03-15 | Disposition: A | Discharge status: Home / Self Care | Payer: Self-pay | Source: Ambulatory Visit | Attending: Cardiology | Admitting: Cardiology

## 2015-03-17 ENCOUNTER — Encounter (HOSPITAL_COMMUNITY)
Admission: RE | Admit: 2015-03-17 | Discharge: 2015-03-17 | Disposition: A | Discharge status: Home / Self Care | Payer: Self-pay | Source: Ambulatory Visit | Attending: Cardiology | Admitting: Cardiology

## 2015-03-20 ENCOUNTER — Encounter (HOSPITAL_COMMUNITY): Payer: Self-pay

## 2015-03-22 ENCOUNTER — Encounter (HOSPITAL_COMMUNITY)
Admission: RE | Admit: 2015-03-22 | Discharge: 2015-03-22 | Disposition: A | Discharge status: Home / Self Care | Payer: Self-pay | Source: Ambulatory Visit | Attending: Cardiology | Admitting: Cardiology

## 2015-03-24 ENCOUNTER — Encounter (HOSPITAL_COMMUNITY)
Admission: RE | Admit: 2015-03-24 | Discharge: 2015-03-24 | Disposition: A | Discharge status: Home / Self Care | Payer: Self-pay | Source: Ambulatory Visit | Attending: Cardiology | Admitting: Cardiology

## 2015-03-27 ENCOUNTER — Encounter (HOSPITAL_COMMUNITY)
Admission: RE | Admit: 2015-03-27 | Discharge: 2015-03-27 | Disposition: A | Discharge status: Home / Self Care | Payer: Self-pay | Source: Ambulatory Visit | Attending: Cardiology | Admitting: Cardiology

## 2015-03-29 ENCOUNTER — Encounter (HOSPITAL_COMMUNITY)
Admission: RE | Admit: 2015-03-29 | Discharge: 2015-03-29 | Disposition: A | Discharge status: Home / Self Care | Payer: Self-pay | Source: Ambulatory Visit | Attending: Cardiology | Admitting: Cardiology

## 2015-03-30 ENCOUNTER — Ambulatory Visit (INDEPENDENT_AMBULATORY_CARE_PROVIDER_SITE_OTHER)
Admission: RE | Admit: 2015-03-30 | Discharge: 2015-03-30 | Disposition: A | Discharge status: Home / Self Care | Payer: Medicare Other | Source: Ambulatory Visit | Attending: Pulmonary Disease | Admitting: Pulmonary Disease

## 2015-03-30 ENCOUNTER — Ambulatory Visit (INDEPENDENT_AMBULATORY_CARE_PROVIDER_SITE_OTHER): Payer: Medicare Other | Admitting: Pulmonary Disease

## 2015-03-30 ENCOUNTER — Other Ambulatory Visit (INDEPENDENT_AMBULATORY_CARE_PROVIDER_SITE_OTHER): Payer: Medicare Other

## 2015-03-30 VITALS — BP 136/78 | HR 56 | Temp 98.3°F | Ht 61.0 in | Wt 155.4 lb

## 2015-03-30 DIAGNOSIS — K449 Diaphragmatic hernia without obstruction or gangrene: Secondary | ICD-10-CM

## 2015-03-30 DIAGNOSIS — M545 Low back pain, unspecified: Secondary | ICD-10-CM

## 2015-03-30 DIAGNOSIS — K589 Irritable bowel syndrome without diarrhea: Secondary | ICD-10-CM

## 2015-03-30 DIAGNOSIS — I10 Essential (primary) hypertension: Secondary | ICD-10-CM

## 2015-03-30 DIAGNOSIS — I2510 Atherosclerotic heart disease of native coronary artery without angina pectoris: Secondary | ICD-10-CM

## 2015-03-30 DIAGNOSIS — I517 Cardiomegaly: Secondary | ICD-10-CM | POA: Diagnosis not present

## 2015-03-30 DIAGNOSIS — F411 Generalized anxiety disorder: Secondary | ICD-10-CM

## 2015-03-30 DIAGNOSIS — I4892 Unspecified atrial flutter: Secondary | ICD-10-CM | POA: Diagnosis not present

## 2015-03-30 DIAGNOSIS — K5730 Diverticulosis of large intestine without perforation or abscess without bleeding: Secondary | ICD-10-CM

## 2015-03-30 DIAGNOSIS — M150 Primary generalized (osteo)arthritis: Secondary | ICD-10-CM

## 2015-03-30 DIAGNOSIS — E559 Vitamin D deficiency, unspecified: Secondary | ICD-10-CM

## 2015-03-30 DIAGNOSIS — D126 Benign neoplasm of colon, unspecified: Secondary | ICD-10-CM

## 2015-03-30 DIAGNOSIS — M159 Polyosteoarthritis, unspecified: Secondary | ICD-10-CM

## 2015-03-30 LAB — BASIC METABOLIC PANEL
BUN: 24 mg/dL — AB (ref 6–23)
CALCIUM: 9.4 mg/dL (ref 8.4–10.5)
CO2: 28 meq/L (ref 19–32)
CREATININE: 1.44 mg/dL — AB (ref 0.40–1.20)
Chloride: 105 mEq/L (ref 96–112)
GFR: 44.17 mL/min — ABNORMAL LOW (ref >60.00)
GLUCOSE: 89 mg/dL (ref 70–99)
Potassium: 4.2 mEq/L (ref 3.5–5.1)
SODIUM: 140 meq/L (ref 135–145)

## 2015-03-30 LAB — LIPID PANEL
CHOLESTEROL: 145 mg/dL (ref 0–200)
HDL: 69.7 mg/dL (ref >39.00)
LDL Cholesterol: 63 mg/dL (ref 0–99)
NONHDL: 75.12
Total CHOL/HDL Ratio: 2
Triglycerides: 62 mg/dL (ref 0.0–149.0)
VLDL: 12.4 mg/dL (ref 0.0–40.0)

## 2015-03-30 LAB — CBC WITH DIFFERENTIAL/PLATELET
BASOS ABS: 0 10*3/uL (ref 0.0–0.1)
Basophils Relative: 0.4 % (ref 0.0–3.0)
EOS ABS: 0.1 10*3/uL (ref 0.0–0.7)
EOS PCT: 0.9 % (ref 0.0–5.0)
HEMATOCRIT: 42.2 % (ref 36.0–46.0)
Hemoglobin: 13.6 g/dL (ref 12.0–15.0)
Lymphocytes Relative: 35.5 % (ref 12.0–46.0)
Lymphs Abs: 2.2 10*3/uL (ref 0.7–4.0)
MCHC: 32.3 g/dL (ref 30.0–36.0)
MCV: 77.6 fl — ABNORMAL LOW (ref 78.0–100.0)
MONO ABS: 0.6 10*3/uL (ref 0.1–1.0)
MONOS PCT: 9.8 % (ref 3.0–12.0)
NEUTROS ABS: 3.3 10*3/uL (ref 1.4–7.7)
Neutrophils Relative %: 53.4 % (ref 43.0–77.0)
PLATELETS: 125 10*3/uL — AB (ref 150.0–400.0)
RBC: 5.44 Mil/uL — ABNORMAL HIGH (ref 3.87–5.11)
RDW: 16.6 % — AB (ref 11.5–15.5)
WBC: 6.2 10*3/uL (ref 4.0–10.5)

## 2015-03-30 LAB — HEPATIC FUNCTION PANEL
ALBUMIN: 4 g/dL (ref 3.5–5.2)
ALK PHOS: 90 U/L (ref 39–117)
ALT: 17 U/L (ref 0–35)
AST: 23 U/L (ref 0–37)
Bilirubin, Direct: 0.2 mg/dL (ref 0.0–0.3)
TOTAL PROTEIN: 7.7 g/dL (ref 6.0–8.3)
Total Bilirubin: 0.8 mg/dL (ref 0.2–1.2)

## 2015-03-30 LAB — VITAMIN D 25 HYDROXY (VIT D DEFICIENCY, FRACTURES): VITD: 41.6 ng/mL (ref 30.00–100.00)

## 2015-03-30 LAB — TSH: TSH: 4 u[IU]/mL (ref 0.35–4.50)

## 2015-03-30 MED ORDER — TRAMADOL HCL 50 MG PO TABS: 50.0000 mg | ORAL_TABLET | Freq: Three times a day (TID) | ORAL | Status: DC | PRN

## 2015-03-30 NOTE — Patient Instructions (Signed)
Today we updated your med list in our EPIC system...    Continue your current medications the same...  We added TRAMADOL 50mg  take one tab up to every [place] as needed for arthritis pain...  Use a heating pad to your left shoulder area as needed...  If the pain persists we will refer you to an Orthopedist for XRays and further eval (shot)...  Today we checked your follow up CXR & FASTING blood work...  Call for any questions...  Let's plan a follow up visit in 54mo, sooner if needed for problems.Marland KitchenMarland Kitchen

## 2015-03-31 ENCOUNTER — Encounter (HOSPITAL_COMMUNITY)
Admission: RE | Admit: 2015-03-31 | Discharge: 2015-03-31 | Disposition: A | Discharge status: Home / Self Care | Payer: Self-pay | Source: Ambulatory Visit | Attending: Cardiology | Admitting: Cardiology

## 2015-04-03 ENCOUNTER — Encounter (HOSPITAL_COMMUNITY)
Admission: RE | Admit: 2015-04-03 | Discharge: 2015-04-03 | Disposition: A | Discharge status: Home / Self Care | Payer: Self-pay | Source: Ambulatory Visit | Attending: Cardiology | Admitting: Cardiology

## 2015-04-05 ENCOUNTER — Encounter (HOSPITAL_COMMUNITY): Payer: Medicare Other

## 2015-04-05 DIAGNOSIS — Z955 Presence of coronary angioplasty implant and graft: Secondary | ICD-10-CM | POA: Insufficient documentation

## 2015-04-05 DIAGNOSIS — Z48812 Encounter for surgical aftercare following surgery on the circulatory system: Secondary | ICD-10-CM | POA: Insufficient documentation

## 2015-04-07 ENCOUNTER — Encounter (HOSPITAL_COMMUNITY): Payer: Self-pay

## 2015-04-10 ENCOUNTER — Encounter (HOSPITAL_COMMUNITY)
Admission: RE | Admit: 2015-04-10 | Discharge: 2015-04-10 | Disposition: A | Discharge status: Home / Self Care | Payer: Self-pay | Source: Ambulatory Visit | Attending: Cardiology | Admitting: Cardiology

## 2015-04-12 ENCOUNTER — Encounter (HOSPITAL_COMMUNITY)
Admission: RE | Admit: 2015-04-12 | Discharge: 2015-04-12 | Disposition: A | Discharge status: Home / Self Care | Payer: Self-pay | Source: Ambulatory Visit | Attending: Cardiology | Admitting: Cardiology

## 2015-04-14 ENCOUNTER — Encounter (HOSPITAL_COMMUNITY): Payer: Self-pay

## 2015-04-14 DIAGNOSIS — I129 Hypertensive chronic kidney disease with stage 1 through stage 4 chronic kidney disease, or unspecified chronic kidney disease: Secondary | ICD-10-CM | POA: Diagnosis not present

## 2015-04-14 DIAGNOSIS — I480 Paroxysmal atrial fibrillation: Secondary | ICD-10-CM | POA: Diagnosis not present

## 2015-04-14 DIAGNOSIS — N189 Chronic kidney disease, unspecified: Secondary | ICD-10-CM | POA: Diagnosis not present

## 2015-04-14 DIAGNOSIS — I252 Old myocardial infarction: Secondary | ICD-10-CM | POA: Diagnosis not present

## 2015-04-14 DIAGNOSIS — I2510 Atherosclerotic heart disease of native coronary artery without angina pectoris: Secondary | ICD-10-CM | POA: Diagnosis not present

## 2015-04-17 ENCOUNTER — Encounter (HOSPITAL_COMMUNITY)
Admission: RE | Admit: 2015-04-17 | Discharge: 2015-04-17 | Disposition: A | Discharge status: Home / Self Care | Payer: Self-pay | Source: Ambulatory Visit | Attending: Cardiology | Admitting: Cardiology

## 2015-04-19 ENCOUNTER — Encounter (HOSPITAL_COMMUNITY)
Admission: RE | Admit: 2015-04-19 | Discharge: 2015-04-19 | Disposition: A | Discharge status: Home / Self Care | Payer: Self-pay | Source: Ambulatory Visit | Attending: Cardiology | Admitting: Cardiology

## 2015-04-21 ENCOUNTER — Encounter (HOSPITAL_COMMUNITY)
Admission: RE | Admit: 2015-04-21 | Discharge: 2015-04-21 | Disposition: A | Discharge status: Home / Self Care | Payer: Self-pay | Source: Ambulatory Visit | Attending: Cardiology | Admitting: Cardiology

## 2015-04-21 ENCOUNTER — Ambulatory Visit (INDEPENDENT_AMBULATORY_CARE_PROVIDER_SITE_OTHER): Payer: Medicare Other

## 2015-04-21 DIAGNOSIS — Z23 Encounter for immunization: Secondary | ICD-10-CM | POA: Diagnosis not present

## 2015-04-24 ENCOUNTER — Encounter (HOSPITAL_COMMUNITY): Payer: Self-pay

## 2015-04-26 ENCOUNTER — Encounter (HOSPITAL_COMMUNITY): Payer: Self-pay

## 2015-04-28 ENCOUNTER — Encounter (HOSPITAL_COMMUNITY)
Admission: RE | Admit: 2015-04-28 | Discharge: 2015-04-28 | Disposition: A | Discharge status: Home / Self Care | Payer: Self-pay | Source: Ambulatory Visit | Attending: Cardiology | Admitting: Cardiology

## 2015-05-01 ENCOUNTER — Encounter (HOSPITAL_COMMUNITY): Payer: Self-pay

## 2015-05-03 ENCOUNTER — Encounter (HOSPITAL_COMMUNITY)
Admission: RE | Admit: 2015-05-03 | Discharge: 2015-05-03 | Disposition: A | Discharge status: Home / Self Care | Payer: Self-pay | Source: Ambulatory Visit | Attending: Cardiology | Admitting: Cardiology

## 2015-05-03 DIAGNOSIS — Z48812 Encounter for surgical aftercare following surgery on the circulatory system: Secondary | ICD-10-CM | POA: Insufficient documentation

## 2015-05-03 DIAGNOSIS — Z955 Presence of coronary angioplasty implant and graft: Secondary | ICD-10-CM | POA: Insufficient documentation

## 2015-05-05 ENCOUNTER — Encounter (HOSPITAL_COMMUNITY): Payer: Self-pay

## 2015-05-08 ENCOUNTER — Emergency Department (HOSPITAL_COMMUNITY): Payer: Medicare Other

## 2015-05-08 ENCOUNTER — Encounter (HOSPITAL_COMMUNITY): Payer: Self-pay | Admitting: Emergency Medicine

## 2015-05-08 ENCOUNTER — Encounter (HOSPITAL_COMMUNITY): Payer: Self-pay

## 2015-05-08 ENCOUNTER — Emergency Department (HOSPITAL_COMMUNITY)
Admission: EM | Admit: 2015-05-08 | Discharge: 2015-05-08 | Disposition: A | Discharge status: Home / Self Care | Payer: Medicare Other | Attending: Emergency Medicine | Admitting: Emergency Medicine

## 2015-05-08 DIAGNOSIS — Z7982 Long term (current) use of aspirin: Secondary | ICD-10-CM | POA: Diagnosis not present

## 2015-05-08 DIAGNOSIS — K219 Gastro-esophageal reflux disease without esophagitis: Secondary | ICD-10-CM | POA: Diagnosis not present

## 2015-05-08 DIAGNOSIS — E7800 Pure hypercholesterolemia, unspecified: Secondary | ICD-10-CM | POA: Diagnosis not present

## 2015-05-08 DIAGNOSIS — S3992XA Unspecified injury of lower back, initial encounter: Secondary | ICD-10-CM | POA: Insufficient documentation

## 2015-05-08 DIAGNOSIS — I252 Old myocardial infarction: Secondary | ICD-10-CM | POA: Diagnosis not present

## 2015-05-08 DIAGNOSIS — F419 Anxiety disorder, unspecified: Secondary | ICD-10-CM | POA: Insufficient documentation

## 2015-05-08 DIAGNOSIS — Z7902 Long term (current) use of antithrombotics/antiplatelets: Secondary | ICD-10-CM | POA: Insufficient documentation

## 2015-05-08 DIAGNOSIS — S8992XA Unspecified injury of left lower leg, initial encounter: Secondary | ICD-10-CM | POA: Insufficient documentation

## 2015-05-08 DIAGNOSIS — Y9389 Activity, other specified: Secondary | ICD-10-CM | POA: Diagnosis not present

## 2015-05-08 DIAGNOSIS — S0990XA Unspecified injury of head, initial encounter: Secondary | ICD-10-CM | POA: Diagnosis not present

## 2015-05-08 DIAGNOSIS — Y998 Other external cause status: Secondary | ICD-10-CM | POA: Insufficient documentation

## 2015-05-08 DIAGNOSIS — S0093XA Contusion of unspecified part of head, initial encounter: Secondary | ICD-10-CM | POA: Diagnosis not present

## 2015-05-08 DIAGNOSIS — Z86010 Personal history of colonic polyps: Secondary | ICD-10-CM | POA: Insufficient documentation

## 2015-05-08 DIAGNOSIS — W19XXXA Unspecified fall, initial encounter: Secondary | ICD-10-CM

## 2015-05-08 DIAGNOSIS — M25552 Pain in left hip: Secondary | ICD-10-CM | POA: Diagnosis not present

## 2015-05-08 DIAGNOSIS — Z8701 Personal history of pneumonia (recurrent): Secondary | ICD-10-CM | POA: Diagnosis not present

## 2015-05-08 DIAGNOSIS — Y9289 Other specified places as the place of occurrence of the external cause: Secondary | ICD-10-CM | POA: Insufficient documentation

## 2015-05-08 DIAGNOSIS — M545 Low back pain: Secondary | ICD-10-CM | POA: Diagnosis not present

## 2015-05-08 DIAGNOSIS — S199XXA Unspecified injury of neck, initial encounter: Secondary | ICD-10-CM | POA: Diagnosis not present

## 2015-05-08 DIAGNOSIS — R42 Dizziness and giddiness: Secondary | ICD-10-CM | POA: Diagnosis not present

## 2015-05-08 DIAGNOSIS — Z79899 Other long term (current) drug therapy: Secondary | ICD-10-CM | POA: Insufficient documentation

## 2015-05-08 DIAGNOSIS — I10 Essential (primary) hypertension: Secondary | ICD-10-CM | POA: Insufficient documentation

## 2015-05-08 DIAGNOSIS — T148 Other injury of unspecified body region: Secondary | ICD-10-CM | POA: Diagnosis not present

## 2015-05-08 DIAGNOSIS — W01198A Fall on same level from slipping, tripping and stumbling with subsequent striking against other object, initial encounter: Secondary | ICD-10-CM | POA: Insufficient documentation

## 2015-05-08 MED ORDER — TRAMADOL HCL 50 MG PO TABS: 25.0000 mg | ORAL_TABLET | Freq: Three times a day (TID) | ORAL | Status: DC | PRN

## 2015-05-08 MED ORDER — TRAMADOL HCL 50 MG PO TABS
25.0000 mg | ORAL_TABLET | Freq: Once | ORAL | Status: AC
  Administered 2015-05-08: 25 mg via ORAL
  Filled 2015-05-08: qty 1

## 2015-05-08 NOTE — ED Notes (Signed)
Pt returns from CT and states pain has increased.

## 2015-05-08 NOTE — ED Notes (Signed)
Pt and family verbalize understanding nof instructions.

## 2015-05-08 NOTE — ED Notes (Signed)
Pt to ct via stretcher

## 2015-05-08 NOTE — ED Notes (Signed)
Pt stated she is hungry.  Pt provided a happy meal and ginger ale per EDP.

## 2015-05-08 NOTE — ED Notes (Signed)
Pt was standing in shower and mat slipped under her feet and pt fell back hitting the back of her head on the tub. Pt denies any LOC. Pt also c/o of pain in mid back and left knee.

## 2015-05-08 NOTE — Discharge Instructions (Signed)
You have a possible compression fracture of 10% at T1 in your back.  Get rechecked immediately if you develop numbness, weakness, worsening pain, or new concerning symptoms.    Facial or Scalp Contusion A facial or scalp contusion is a deep bruise on the face or head. Injuries to the face and head generally cause a lot of swelling, especially around the eyes. Contusions are the result of an injury that caused bleeding under the skin. The contusion may turn blue, purple, or yellow. Minor injuries will give you a painless contusion, but more severe contusions may stay painful and swollen for a few weeks.  CAUSES  A facial or scalp contusion is caused by a blunt injury or trauma to the face or head area.  SIGNS AND SYMPTOMS   Swelling of the injured area.   Discoloration of the injured area.   Tenderness, soreness, or pain in the injured area.  DIAGNOSIS  The diagnosis can be made by taking a medical history and doing a physical exam. An X-ray exam, CT scan, or MRI may be needed to determine if there are any associated injuries, such as broken bones (fractures). TREATMENT  Often, the best treatment for a facial or scalp contusion is applying cold compresses to the injured area. Over-the-counter medicines may also be recommended for pain control.  HOME CARE INSTRUCTIONS   Only take over-the-counter or prescription medicines as directed by your health care provider.   Apply ice to the injured area.   Put ice in a plastic bag.   Place a towel between your skin and the bag.   Leave the ice on for 20 minutes, 2-3 times a day.  SEEK MEDICAL CARE IF:  You have bite problems.   You have pain with chewing.   You are concerned about facial defects. SEEK IMMEDIATE MEDICAL CARE IF:  You have severe pain or a headache that is not relieved by medicine.   You have unusual sleepiness, confusion, or personality changes.   You throw up (vomit).   You have a persistent nosebleed.    You have double vision or blurred vision.   You have fluid drainage from your nose or ear.   You have difficulty walking or using your arms or legs.  MAKE SURE YOU:   Understand these instructions.  Will watch your condition.  Will get help right away if you are not doing well or get worse.   This information is not intended to replace advice given to you by your health care provider. Make sure you discuss any questions you have with your health care provider.   Document Released: 03/28/2004 Document Revised: 03/11/2014 Document Reviewed: 10/01/2012 Elsevier Interactive Patient Education 2016 [employer] Vertebral Fracture A vertebral fracture is a break in one of the bones that make up the spine (vertebrae). The vertebrae are stacked on top of each other to form the spinal column. They support the body and protect the spinal cord. The vertebral column has an upper part (cervical spine), a middle part (thoracic spine), and a lower part (lumbar spine). Most vertebral fractures occur in the thoracic spine or lumbar spine. There are three main types of vertebral fractures:  Flexion fracture. This happens when vertebrae collapse. Vertebrae can collapse:  In the front (compression fracture). This type of fracture is common in people who have a condition that causes their bones to be weak and brittle (osteoporosis). The fracture can make a person lose height.  In the front and back (axial burst  fracture).  Extension fracture. This happens when an external force pulls apart the vertebrae.  Rotation fracture. This happens when the spine bends extremely in one direction. This type can cause a piece of a vertebra to break off (transverse process fracture) or move out of its normal position (fracture dislocation). This type of fracture has a high risk for spinal cord injury. Vertebral fractures can range from mild to very severe. The most severe types are those that cause the broken  bones to move out of place (unstable) and those that injure or press on the spinal cord. CAUSES This condition is usually caused by a forceful injury. This type of injury commonly results from:  Car accidents.  Falling or jumping from a great height.  Collisions in contact sports.  Violent acts, such as an assault or a gunshot wound. RISK FACTORS This injury is more likely to happen to people who:  Have osteoporosis.  Participate in contact sports.  Are in situations that could result in falls or other violent injuries. SYMPTOMS Symptoms of this injury depend on the location and the type of fracture. The most common symptom is back pain that gets worse with movement. You may also have trouble standing or walking. If a fracture has damaged your spinal cord or is pressing on it, you may also have:  Numbness.  Tingling.  Weakness.  Loss of movement.  Loss of bowel or bladder control. DIAGNOSIS This injury may be diagnosed based on symptoms, medical history, and a physical exam. You may also have imaging tests to confirm the diagnosis. These may include:  Spine X-ray.  CT scan.  MRI. TREATMENT Treatment for this injury depends on the type of fracture. If your fracture is stable and does not affect your spinal cord, it may heal with nonsurgical treatment, such as:  Taking pain medicine.  Wearing a cast or a brace.  Doing physical therapy exercises. If your vertebral fracture is unstable or it affects your spinal cord, you may need surgical treatment, such as:  Laminectomy. This procedure involves removing the part of a vertebra that is pushing on the spinal cord (spinal decompression surgery). Bone fragments may also be removed.  Spinal fusion. This procedure is used to stabilize an unstable fracture. Vertebrae may be joined together with a piece of bone from another part of your body (graft) and held in place with rods, plates, or screws.  Vertebroplasty. In this  procedure, bone cement is used to rebuild collapsed vertebrae. HOME CARE INSTRUCTIONS General Instructions  Take medicines only as directed by your health care provider.  Do not drive or operate heavy machinery while taking pain medicine.  If directed, apply ice to the injured area:  Put ice in a plastic bag.  Place a towel between your skin and the bag.  Leave the ice on for 30 minutes every two hours at first. Then apply the ice as needed.  Wear your neck brace or back brace as directed by your health care provider.  Do not drink alcohol. Alcohol can interfere with your treatment.  Keep all follow-up visits as directed by your health care provider. This is important. It can help to prevent permanent injury, disability, and long-lasting (chronic) pain. Activity  Stay in bed (on bed rest) only as directed by your health care provider. Being on bed rest for too long can make your condition worse.  Return to your normal activities as directed by your health care provider. Ask what activities are safe for you.  Do exercises to improve motion and strength in your back (physical therapy), as recommended by your health care provider.   Exercise regularly as directed by your health care provider. SEEK MEDICAL CARE IF:  You have a fever.  You develop a cough that makes your pain worse.  Your pain medicine is not helping.  Your pain does not get better over time.  You cannot return to your normal activities as planned or expected. SEEK IMMEDIATE MEDICAL CARE IF:  Your pain is very bad and it suddenly gets worse.  You are unable to move any body part (paralysis) that is below the level of your injury.  You have numbness, tingling, or weakness in any body part that is below the level of your injury.  You cannot control your bladder or bowels.   This information is not intended to replace advice given to you by your health care provider. Make sure you discuss any questions you  have with your health care provider.   Document Released: 03/28/2004 Document Revised: 07/05/2014 Document Reviewed: 02/23/2014 Elsevier Interactive Patient Education [employer].

## 2015-05-08 NOTE — ED Provider Notes (Signed)
CSN: [id number]     Arrival date & time 05/08/15  1243 History   First MD Initiated Contact with Patient 05/08/15 1314     Chief Complaint  Patient presents with  . Fall     Patient is a 82 y.o. female presenting with fall. The history is provided by the patient. No language interpreter was used.  Fall   Virginia Casey is a 82 y.o. female who presents to the Emergency Department complaining of fall.  She was getting out of the tub today and the bathmat slipped and she fell backwards, striking her head on the tub.  She tried to get up and struck her head a second time.  No LOC, vomiting, numbness.  She reports pain in her head, low back and left knee.  She has been able to bear weight on the left leg since the accident.    Past Medical History  Diagnosis Date  . HTN (hypertension)   . Atherosclerotic heart disease   . Atrial flutter, paroxysmal ([place])   . PVD (peripheral vascular disease) ([place])   . Hypercholesterolemia   . Hiatal hernia   . Diverticulosis of colon   . Colon polyp   . Vitamin D deficiency   . Anxiety   . Anemia   . Myocardial infarction ([place]) 2010  . Pneumonia     "once; years ago" (10/06/2012)  . Exertional shortness of breath   . History of blood transfusion ? 2010  . GERD (gastroesophageal reflux disease)   . DJD (degenerative joint disease)     "legs" (10/06/2012)   Past Surgical History  Procedure Laterality Date  . Total hip arthroplasty Right 12/09    Dr Marlou Sa  . Coronary angioplasty with stent placement  04/2008    "1; Dr Terrence Dupont" (10/06/2012)  . Coronary angioplasty  10/06/2012  . Dilation and curettage of uterus    . Left heart catheterization with coronary angiogram N/A 10/06/2012    Procedure: LEFT HEART CATHETERIZATION WITH CORONARY ANGIOGRAM;  Surgeon: Clent Demark, MD;  Location: [facility];  Service: Cardiovascular;  Laterality: N/A;   Family History  Problem Relation Age of Onset  . Heart disease Mother   . Heart disease Maternal Grandmother   .  Heart disease Maternal Grandfather   . Rheum arthritis Mother   . Rheum arthritis Father   . Lung cancer Father   . Stroke Maternal Aunt    Social History  Substance Use Topics  . Smoking status: Never Smoker   . Smokeless tobacco: Never Used  . Alcohol Use: No   OB History    No data available     Review of Systems  All other systems reviewed and are negative.     Allergies  Review of patient's allergies indicates no known allergies.  Home Medications   Prior to Admission medications   Medication Sig Start Date End Date Taking? Authorizing Provider  acetaminophen (TYLENOL) 325 MG tablet Take 325 mg by mouth every 6 (six) hours as needed. For pain   Yes Historical Provider, MD  amiodarone (PACERONE) 200 MG tablet Take 200 mg by mouth daily.    Yes Historical Provider, MD  amLODipine (NORVASC) 5 MG tablet Take 5 mg by mouth daily.   Yes Historical Provider, MD  aspirin 81 MG tablet Take 81 mg by mouth daily.    Yes Historical Provider, MD  clopidogrel (PLAVIX) 75 MG tablet Take 1 tablet (75 mg total) by mouth daily with breakfast. 10/07/12  Yes  Charolette Forward, MD  losartan (COZAAR) 100 MG tablet take 1 tablet by mouth once daily 12/26/14  Yes Noralee Space, MD  pantoprazole (PROTONIX) 40 MG tablet take 1 tablet by mouth twice a day 02/28/15  Yes Noralee Space, MD  rosuvastatin (CRESTOR) 20 MG tablet Take 20 mg by mouth at bedtime.    Yes Historical Provider, MD  ALPRAZolam Duanne Moron) 0.25 MG tablet take 1 tablet by mouth three times a day if needed 11/24/14   Noralee Space, MD  dicyclomine (BENTYL) 20 MG tablet Take 1/2-1 tablet by mouth up to 4 times a day as needed for abdominal cramping 07/27/14   Noralee Space, MD  Ipratropium-Albuterol (COMBIVENT RESPIMAT) 20-100 MCG/ACT AERS respimat Inhale 1 puff into the lungs every 6 (six) hours. 09/22/12   Noralee Space, MD  nitroGLYCERIN (NITROSTAT) 0.4 MG SL tablet Place 0.4 mg under the tongue every 5 (five) minutes as needed. May repeat x3      Historical Provider, MD  traMADol (ULTRAM) 50 MG tablet Take 0.5-1 tablets (25-50 mg total) by mouth every 8 (eight) hours as needed for moderate pain or severe pain. 05/08/15   Quintella Reichert, MD   BP 111/47 mmHg  Pulse 59  Temp(Src) 98.6 F (37 C) (Oral)  Resp 16  Ht 5\' 3"  (1.6 m)  Wt 155 lb (70.308 kg)  BMI 27.46 kg/m2  SpO2 97% Physical Exam  Constitutional: She is oriented to person, place, and time. She appears well-developed and well-nourished.  HENT:  Head: Normocephalic.  Two areas of swelling to the occiput with local tenderness.    Neck:  No cspine tenderness  Cardiovascular: Normal rate and regular rhythm.   No murmur heard. Pulmonary/Chest: Effort normal and breath sounds normal. No respiratory distress.  Abdominal: Soft. There is no tenderness. There is no rebound and no guarding.  Musculoskeletal: She exhibits no tenderness.  Mild mid lumbar tenderness, mild to moderate left knee tenderness.  2+ DP pulses bilaterally.  No significant hip tenderness.  Decreased ROM in left hip/knee secondary to pain.    Neurological: She is alert and oriented to person, place, and time.  Skin: Skin is warm and dry.  Psychiatric: She has a normal mood and affect. Her behavior is normal.  Nursing note and vitals reviewed.   ED Course  Procedures (including critical care time) Labs Review Labs Reviewed - No data to display  Imaging Review Dg Lumbar Spine Complete  05/08/2015  CLINICAL DATA:  Slip and fall in shower with pain, initial encounter EXAM: LUMBAR SPINE - COMPLETE 4+ VIEW COMPARISON:  None. FINDINGS: Right hip replacement is noted. Five lumbar type vertebral bodies are well visualized. Vertebral body height is well maintained. Facet hypertrophic changes are noted. No anterolisthesis is seen. IMPRESSION: Degenerative change without acute abnormality. Electronically Signed   By: Inez Catalina M.D.   On: 05/08/2015 14:47   Ct Head Wo Contrast  05/08/2015  CLINICAL DATA:  Fall  this morning striking head in the bathroom. Dizziness afterwards. EXAM: CT HEAD WITHOUT CONTRAST CT CERVICAL SPINE WITHOUT CONTRAST TECHNIQUE: Multidetector CT imaging of the head and cervical spine was performed following the standard protocol without intravenous contrast. Multiplanar CT image reconstructions of the cervical spine were also generated. COMPARISON:  03/06/2006 FINDINGS: CT HEAD FINDINGS The brainstem, cerebellum, cerebral peduncles, thalami, basal ganglia, basilar cisterns, and ventricular system appear within normal limits. Periventricular white matter and corona radiata hypodensities favor chronic ischemic microvascular white matter disease. No intracranial hemorrhage, mass lesion,  or acute CVA. Left parietal scalp hematoma, image 18 series 201. There is atherosclerotic calcification of the cavernous carotid arteries bilaterally. There is a large plug of cerumen occluding the left external auditory canal. Old right medial orbital wall fracture. Mild chronic left sphenoid and bilateral ethmoid sinusitis. The mandibular condyles are anteriorly displaced with respect to the condylar fossa during imaging, likely because the patient's mouth was opened during imaging. CT CERVICAL SPINE FINDINGS Calcification of mild thickening of the transverse ligament of C1. Degenerative arthropathy at the C1- 2 articulation especially eccentric to the left. Multilevel degenerative facet arthropathy, with bony fusion of the facet joints at C3-4 specially on the left side. There is some abnormal superior endplate concavity of both T1 and T2 particularly at the T1 level there is some irregularity along this superior endplate concavity and possible inferior endplate irregularity, such that a mild acute compression fracture at the T1 level is suspected, tended 15% loss of vertebral height. The compression fracture along the superior endplate of T2 is age indeterminate. There may be a 1 mm of posterior bony retropulsion  associated with the each. Degenerative sternoclavicular arthropathy. Small osteochondral fragments are present in the right subscapular recess and accordingly likely loose within the joint. Cervical spondylosis causes osseous foraminal narrowing on the right at C5-6 and C6-7 ; on the left at C6-7. IMPRESSION: 1. Suspected acute 10-15% superior endplate compression fracture at T1, questionable extension to the inferior endplate. Admittedly this is a subtle finding and could possibly be chronic, although the slight irregularity along parts of the endplates seem to favor an acute timeframe. 2. Age-indeterminate 10% superior endplate compression fracture at T2. 3. Cervical spondylosis causing osseous foraminal stenosis at several levels. 4. No acute intracranial findings. Mild chronic left sphenoid and bilateral ethmoid sinusitis. 5. Large plug of cerumen occludes the left external auditory canal. 6. Periventricular white matter and corona radiata hypodensities favor chronic ischemic microvascular white matter disease. 7. Left parietal scalp hematoma. Electronically Signed   By: Van Clines M.D.   On: 05/08/2015 16:14   Ct Cervical Spine Wo Contrast  05/08/2015  CLINICAL DATA:  Fall this morning striking head in the bathroom. Dizziness afterwards. EXAM: CT HEAD WITHOUT CONTRAST CT CERVICAL SPINE WITHOUT CONTRAST TECHNIQUE: Multidetector CT imaging of the head and cervical spine was performed following the standard protocol without intravenous contrast. Multiplanar CT image reconstructions of the cervical spine were also generated. COMPARISON:  03/06/2006 FINDINGS: CT HEAD FINDINGS The brainstem, cerebellum, cerebral peduncles, thalami, basal ganglia, basilar cisterns, and ventricular system appear within normal limits. Periventricular white matter and corona radiata hypodensities favor chronic ischemic microvascular white matter disease. No intracranial hemorrhage, mass lesion, or acute CVA. Left parietal scalp  hematoma, image 18 series 201. There is atherosclerotic calcification of the cavernous carotid arteries bilaterally. There is a large plug of cerumen occluding the left external auditory canal. Old right medial orbital wall fracture. Mild chronic left sphenoid and bilateral ethmoid sinusitis. The mandibular condyles are anteriorly displaced with respect to the condylar fossa during imaging, likely because the patient's mouth was opened during imaging. CT CERVICAL SPINE FINDINGS Calcification of mild thickening of the transverse ligament of C1. Degenerative arthropathy at the C1- 2 articulation especially eccentric to the left. Multilevel degenerative facet arthropathy, with bony fusion of the facet joints at C3-4 specially on the left side. There is some abnormal superior endplate concavity of both T1 and T2 particularly at the T1 level there is some irregularity along this superior endplate concavity and  possible inferior endplate irregularity, such that a mild acute compression fracture at the T1 level is suspected, tended 15% loss of vertebral height. The compression fracture along the superior endplate of T2 is age indeterminate. There may be a 1 mm of posterior bony retropulsion associated with the each. Degenerative sternoclavicular arthropathy. Small osteochondral fragments are present in the right subscapular recess and accordingly likely loose within the joint. Cervical spondylosis causes osseous foraminal narrowing on the right at C5-6 and C6-7 ; on the left at C6-7. IMPRESSION: 1. Suspected acute 10-15% superior endplate compression fracture at T1, questionable extension to the inferior endplate. Admittedly this is a subtle finding and could possibly be chronic, although the slight irregularity along parts of the endplates seem to favor an acute timeframe. 2. Age-indeterminate 10% superior endplate compression fracture at T2. 3. Cervical spondylosis causing osseous foraminal stenosis at several levels. 4.  No acute intracranial findings. Mild chronic left sphenoid and bilateral ethmoid sinusitis. 5. Large plug of cerumen occludes the left external auditory canal. 6. Periventricular white matter and corona radiata hypodensities favor chronic ischemic microvascular white matter disease. 7. Left parietal scalp hematoma. Electronically Signed   By: Van Clines M.D.   On: 05/08/2015 16:14   Dg Knee Complete 4 Views Left  05/08/2015  CLINICAL DATA:  Status post slip and fall in the bathroom today with a left knee injury. Initial encounter. EXAM: LEFT KNEE - COMPLETE 4+ VIEW COMPARISON:  None. FINDINGS: There is no acute bony or joint abnormality. No joint effusion is identified. The patient has severe tricompartmental osteoarthritis about the knee. Remote healed fracture of the proximal diaphysis of the fibula is noted. Atherosclerosis is identified. IMPRESSION: No acute abnormality. Severe tricompartmental osteoarthritis. Remote healed proximal fibular fracture. Electronically Signed   By: Inge Rise M.D.   On: 05/08/2015 14:46   Dg Hip Unilat With Pelvis 2-3 Views Left  05/08/2015  CLINICAL DATA:  Recent fall with left hip pain, initial encounter EXAM: DG HIP (WITH OR WITHOUT PELVIS) 2-3V LEFT COMPARISON:  None. FINDINGS: Right hip replacement is noted. Pelvic ring appears intact. Degenerative changes of the left hip joint are seen with remodeling of the femoral head as well as sclerosis and subchondral cyst formation. No soft tissue changes are seen. No acute fracture is noted. IMPRESSION: Degenerative changes without definitive fracture. Electronically Signed   By: Inez Catalina M.D.   On: 05/08/2015 14:48   I have personally reviewed and evaluated these images and lab results as part of my medical decision-making.   EKG Interpretation None      MDM   Final diagnoses:  Fall, initial encounter  Head contusion, initial encounter    Pt here for evaluation of injuries following a mechanical  fall.  She is NVI on examination.  CT C spine with possible acute vs subacute T1 compression fracture.  On initial eval pt with no C/T spine tenderness.  On repeat exam she has mild diffuse upper back tenderness, no focal tenderness.  She is able to ambulate in the department without any new pain/weakness.  D/w pt and family findings of imaging with possible compression fracture.  Discussed home care, outpatient follow up, return precautions.      Quintella Reichert, MD 05/09/15 [id number]

## 2015-05-09 ENCOUNTER — Telehealth: Payer: Self-pay | Admitting: Pulmonary Disease

## 2015-05-09 NOTE — Telephone Encounter (Signed)
Spoke with pt, wanted to make SN aware that she fell in the bathtub yesterday and was taken via EMS to the ED.  Documentation from this visit available in Epic.   Pt wants to schedule a hospital follow up with the office.  I informed pt that SN is booked out through the rest of the month but TP has several openings later this month.   Pt is currently in [place] with her granddaughter.  Pt will call back to schedule hfu with TP.  Will await call back.

## 2015-05-09 NOTE — Telephone Encounter (Signed)
Pt was seen in ED 05/08/15 and told to call the office.

## 2015-05-10 ENCOUNTER — Encounter (HOSPITAL_COMMUNITY): Payer: Self-pay

## 2015-05-11 NOTE — Telephone Encounter (Signed)
Pt has an appt scheduled with TP on 05/12/15 - [id number] to see if anything further is needed.

## 2015-05-12 ENCOUNTER — Ambulatory Visit (INDEPENDENT_AMBULATORY_CARE_PROVIDER_SITE_OTHER): Payer: Medicare Other | Admitting: Adult Health

## 2015-05-12 ENCOUNTER — Encounter: Payer: Self-pay | Admitting: Adult Health

## 2015-05-12 ENCOUNTER — Encounter (HOSPITAL_COMMUNITY): Payer: Self-pay

## 2015-05-12 VITALS — BP 128/72 | HR 68 | Temp 99.2°F | Ht 63.0 in | Wt 154.4 lb

## 2015-05-12 DIAGNOSIS — W19XXXD Unspecified fall, subsequent encounter: Secondary | ICD-10-CM | POA: Diagnosis not present

## 2015-05-12 DIAGNOSIS — W19XXXA Unspecified fall, initial encounter: Secondary | ICD-10-CM | POA: Insufficient documentation

## 2015-05-12 DIAGNOSIS — J45909 Unspecified asthma, uncomplicated: Secondary | ICD-10-CM

## 2015-05-12 NOTE — Progress Notes (Signed)
Subjective:    Patient ID: Virginia Casey, female    DOB: [birth year], 82 y.o.   MRN: [id number]  HPI 38 female with asthmatic bronchitis , HTN , CAD s/p stent, hyperlipidemia followed by Dr. Kriste Basque   05/12/2015 EF follow up  Pt returns for follow up .  Pt fell in shower on 05/08/15. Says the bathmat slipped while she was in the shower causing her to fall backward striking her head and back. She had no LOC. Does have soreness along posterior scalp, and back. No visual/speech changes . No arm weakness. Does have dizziness when she gets up in am, sits for a short while and goes away. None with walking.  She went to ER.  CT head, and C Spine.showed a T 1 and 2 small compression fx. She denies sign back pain. Says more of a soreness. Takes tylenol that helps.  LS, knee and hip xray neg for acute fx. She is feeling better but still sore and weak. Staying with grand daughtter in [place].  She denies chest pain, orthopnea , edema or confusion    Past Medical History  Diagnosis Date  . HTN (hypertension)   . Atherosclerotic heart disease   . Atrial flutter, paroxysmal (HCC)   . PVD (peripheral vascular disease) (HCC)   . Hypercholesterolemia   . Hiatal hernia   . Diverticulosis of colon   . Colon polyp   . Vitamin D deficiency   . Anxiety   . Anemia   . Myocardial infarction (HCC) 2010  . Pneumonia     "once; years ago" (10/06/2012)  . Exertional shortness of breath   . History of blood transfusion ? 2010  . GERD (gastroesophageal reflux disease)   . DJD (degenerative joint disease)     "legs" (10/06/2012)   Current Outpatient Prescriptions on File Prior to Visit  Medication Sig Dispense Refill  . acetaminophen (TYLENOL) 325 MG tablet Take 325 mg by mouth every 6 (six) hours as needed. For pain    . ALPRAZolam (XANAX) 0.25 MG tablet take 1 tablet by mouth three times a day if needed 90 tablet 1  . amiodarone (PACERONE) 200 MG tablet Take 200 mg by mouth daily.     [name] amLODipine (NORVASC)  5 MG tablet Take 5 mg by mouth daily.    [name] aspirin 81 MG tablet Take 81 mg by mouth daily.     . clopidogrel (PLAVIX) 75 MG tablet Take 1 tablet (75 mg total) by mouth daily with breakfast. 30 tablet 3  . dicyclomine (BENTYL) 20 MG tablet Take 1/2-1 tablet by mouth up to 4 times a day as needed for abdominal cramping 30 tablet 2  . Ipratropium-Albuterol (COMBIVENT RESPIMAT) 20-100 MCG/ACT AERS respimat Inhale 1 puff into the lungs every 6 (six) hours. 4 g 11  . losartan (COZAAR) 100 MG tablet take 1 tablet by mouth once daily 90 tablet 1  . nitroGLYCERIN (NITROSTAT) 0.4 MG SL tablet Place 0.4 mg under the tongue every 5 (five) minutes as needed. May repeat x3     . pantoprazole (PROTONIX) 40 MG tablet take 1 tablet by mouth twice a day 60 tablet 11  . rosuvastatin (CRESTOR) 20 MG tablet Take 20 mg by mouth at bedtime.     . traMADol (ULTRAM) 50 MG tablet Take 0.5-1 tablets (25-50 mg total) by mouth every 8 (eight) hours as needed for moderate pain or severe pain. 15 tablet 0   No current facility-administered medications on file prior to visit.  Review of Systems Constitutional:   No  weight loss, night sweats,  Fevers, chills, fatigue, or  lassitude.  HEENT:   No headaches,  Difficulty swallowing,  Tooth/dental problems, or  Sore throat,                No sneezing, itching, ear ache, nasal congestion, post nasal drip,   CV:  No chest pain,  Orthopnea, PND, swelling in lower extremities, anasarca, dizziness, palpitations, syncope.   GI  No heartburn, indigestion, abdominal pain, nausea, vomiting, diarrhea, change in bowel habits, loss of appetite, bloody stools.   Resp: No shortness of breath with exertion or at rest.  No excess mucus, no productive cough,  No non-productive cough,  No coughing up of blood.  No change in color of mucus.  No wheezing.  No chest wall deformity  Skin: no rash or lesions.  GU: no dysuria, change in color of urine, no urgency or frequency.  No flank pain,  no hematuria   MS:  No joint pain or swelling.  No decreased range of motion. + back pain.  Psych:  No change in mood or affect. No depression or anxiety.  No memory loss.         Objective:   Physical Exam Filed Vitals:   05/12/15 1424  BP: 128/72  Pulse: 68  Temp: 99.2 F (37.3 C)   GEN: A/Ox3; pleasant , NAD, elderly   HEENT:  [place]/AT,  EACs-clear, TMs-wnl, NOSE-clear, THROAT-clear, no lesions, no postnasal drip or exudate noted.   NECK:  Supple w/ fair ROM; no JVD; normal carotid impulses w/o bruits; no thyromegaly or nodules palpated; no lymphadenopathy.  RESP  Clear  P & A; w/o, wheezes/ rales/ or rhonchi.no accessory muscle use, no dullness to percussion  CARD:  RRR, no m/r/g  , no peripheral edema, pulses intact, no cyanosis or clubbing.  GI:   Soft & nt; nml bowel sounds; no organomegaly or masses detected.  Musco: Warm bil, no deformities or joint swelling noted.  Tenderness along post scalp , no obvious ecchymosis noted. Mild tenderness along upper back , no deformity noted.   Neuro: alert, no focal deficits noted., CN 2-12 intact, nml grips, nml gait , no ext weakness or pronator drift.   Skin: Warm, no lesions or rashes  Juliona Vales NP-C  [facility]  05/12/2015        Assessment & Plan:

## 2015-05-12 NOTE — Assessment & Plan Note (Signed)
Fall in bathtub on 05/08/15 -probable mild T1 and 2 compression fx.  Does not appear to have sign pain, will tx conservatively unless pain worsens.  Tylenol and ice/heat.  Neuro exam is unrevealing.CT head neg for acute process.

## 2015-05-12 NOTE — Patient Instructions (Signed)
Ice and heat to back As needed   Tylenol As needed   Change positions slowly .  Call back if dizziness does not resolve or worsens .  Please contact office for sooner follow up if symptoms do not improve or worsen or seek emergency care  follow up Dr. Lenna Gilford as planned and As needed

## 2015-05-15 ENCOUNTER — Encounter (HOSPITAL_COMMUNITY): Payer: Self-pay

## 2015-05-17 ENCOUNTER — Encounter (HOSPITAL_COMMUNITY): Payer: Self-pay

## 2015-05-19 ENCOUNTER — Encounter (HOSPITAL_COMMUNITY): Payer: Self-pay

## 2015-05-22 ENCOUNTER — Encounter (HOSPITAL_COMMUNITY): Payer: Self-pay

## 2015-05-22 ENCOUNTER — Other Ambulatory Visit: Payer: Self-pay | Admitting: Emergency Medicine

## 2015-05-22 MED ORDER — VITAMIN D (ERGOCALCIFEROL) 1.25 MG (50000 UNIT) PO CAPS: 50000.0000 [IU] | ORAL_CAPSULE | ORAL | Status: DC

## 2015-05-24 ENCOUNTER — Encounter (HOSPITAL_COMMUNITY): Payer: Self-pay

## 2015-05-26 ENCOUNTER — Encounter (HOSPITAL_COMMUNITY): Payer: Self-pay

## 2015-05-29 ENCOUNTER — Encounter (HOSPITAL_COMMUNITY)
Admission: RE | Admit: 2015-05-29 | Discharge: 2015-05-29 | Disposition: A | Discharge status: Home / Self Care | Payer: Self-pay | Source: Ambulatory Visit | Attending: Cardiology | Admitting: Cardiology

## 2015-05-31 ENCOUNTER — Encounter (HOSPITAL_COMMUNITY)
Admission: RE | Admit: 2015-05-31 | Discharge: 2015-05-31 | Disposition: A | Discharge status: Home / Self Care | Payer: Self-pay | Source: Ambulatory Visit | Attending: Cardiology | Admitting: Cardiology

## 2015-06-02 ENCOUNTER — Encounter (HOSPITAL_COMMUNITY): Payer: Self-pay

## 2015-06-05 ENCOUNTER — Encounter (HOSPITAL_COMMUNITY)
Admission: RE | Admit: 2015-06-05 | Discharge: 2015-06-05 | Disposition: A | Discharge status: Home / Self Care | Payer: Self-pay | Source: Ambulatory Visit | Attending: Cardiology | Admitting: Cardiology

## 2015-06-05 DIAGNOSIS — Z955 Presence of coronary angioplasty implant and graft: Secondary | ICD-10-CM | POA: Insufficient documentation

## 2015-06-07 ENCOUNTER — Encounter (HOSPITAL_COMMUNITY)
Admission: RE | Admit: 2015-06-07 | Discharge: 2015-06-07 | Disposition: A | Discharge status: Home / Self Care | Payer: Self-pay | Source: Ambulatory Visit | Attending: Cardiology | Admitting: Cardiology

## 2015-06-09 ENCOUNTER — Encounter (HOSPITAL_COMMUNITY): Payer: Self-pay

## 2015-06-12 ENCOUNTER — Encounter (HOSPITAL_COMMUNITY)
Admission: RE | Admit: 2015-06-12 | Discharge: 2015-06-12 | Disposition: A | Discharge status: Home / Self Care | Payer: Self-pay | Source: Ambulatory Visit | Attending: Cardiology | Admitting: Cardiology

## 2015-06-14 ENCOUNTER — Encounter (HOSPITAL_COMMUNITY): Payer: Self-pay

## 2015-06-16 ENCOUNTER — Encounter (HOSPITAL_COMMUNITY): Payer: Self-pay

## 2015-06-19 ENCOUNTER — Encounter (HOSPITAL_COMMUNITY)
Admission: RE | Admit: 2015-06-19 | Discharge: 2015-06-19 | Disposition: A | Discharge status: Home / Self Care | Payer: Self-pay | Source: Ambulatory Visit | Attending: Cardiology | Admitting: Cardiology

## 2015-06-21 ENCOUNTER — Encounter (HOSPITAL_COMMUNITY)
Admission: RE | Admit: 2015-06-21 | Discharge: 2015-06-21 | Disposition: A | Discharge status: Home / Self Care | Payer: Self-pay | Source: Ambulatory Visit | Attending: Cardiology | Admitting: Cardiology

## 2015-06-23 ENCOUNTER — Encounter (HOSPITAL_COMMUNITY): Admission: RE | Admit: 2015-06-23 | Payer: Self-pay | Source: Ambulatory Visit

## 2015-06-26 ENCOUNTER — Other Ambulatory Visit: Payer: Self-pay | Admitting: Emergency Medicine

## 2015-06-26 ENCOUNTER — Encounter (HOSPITAL_COMMUNITY)
Admission: RE | Admit: 2015-06-26 | Discharge: 2015-06-26 | Disposition: A | Discharge status: Home / Self Care | Payer: Self-pay | Source: Ambulatory Visit | Attending: Cardiology | Admitting: Cardiology

## 2015-06-26 MED ORDER — ALPRAZOLAM 0.25 MG PO TABS: ORAL_TABLET | ORAL | Status: DC

## 2015-06-28 ENCOUNTER — Encounter (HOSPITAL_COMMUNITY)
Admission: RE | Admit: 2015-06-28 | Discharge: 2015-06-28 | Disposition: A | Discharge status: Home / Self Care | Payer: Self-pay | Source: Ambulatory Visit | Attending: Cardiology | Admitting: Cardiology

## 2015-06-30 ENCOUNTER — Encounter (HOSPITAL_COMMUNITY): Payer: Self-pay

## 2015-07-03 ENCOUNTER — Encounter (HOSPITAL_COMMUNITY)
Admission: RE | Admit: 2015-07-03 | Discharge: 2015-07-03 | Disposition: A | Discharge status: Home / Self Care | Payer: Self-pay | Source: Ambulatory Visit | Attending: Cardiology | Admitting: Cardiology

## 2015-07-03 DIAGNOSIS — I213 ST elevation (STEMI) myocardial infarction of unspecified site: Secondary | ICD-10-CM | POA: Insufficient documentation

## 2015-07-05 ENCOUNTER — Encounter (HOSPITAL_COMMUNITY): Payer: Self-pay

## 2015-07-07 ENCOUNTER — Encounter (HOSPITAL_COMMUNITY): Payer: Self-pay

## 2015-07-10 ENCOUNTER — Encounter (HOSPITAL_COMMUNITY)
Admission: RE | Admit: 2015-07-10 | Discharge: 2015-07-10 | Disposition: A | Discharge status: Home / Self Care | Payer: Self-pay | Source: Ambulatory Visit | Attending: Cardiology | Admitting: Cardiology

## 2015-07-12 ENCOUNTER — Encounter (HOSPITAL_COMMUNITY)
Admission: RE | Admit: 2015-07-12 | Discharge: 2015-07-12 | Disposition: A | Discharge status: Home / Self Care | Payer: Self-pay | Source: Ambulatory Visit | Attending: Cardiology | Admitting: Cardiology

## 2015-07-14 ENCOUNTER — Encounter (HOSPITAL_COMMUNITY): Payer: Self-pay

## 2015-07-17 ENCOUNTER — Encounter (HOSPITAL_COMMUNITY)
Admission: RE | Admit: 2015-07-17 | Discharge: 2015-07-17 | Disposition: A | Discharge status: Home / Self Care | Payer: Self-pay | Source: Ambulatory Visit | Attending: Cardiology | Admitting: Cardiology

## 2015-07-18 ENCOUNTER — Telehealth: Payer: Self-pay | Admitting: Pulmonary Disease

## 2015-07-18 ENCOUNTER — Other Ambulatory Visit: Payer: Self-pay | Admitting: *Deleted

## 2015-07-18 MED ORDER — LOSARTAN POTASSIUM 100 MG PO TABS: 100.0000 mg | ORAL_TABLET | Freq: Every day | ORAL | Status: DC

## 2015-07-18 MED ORDER — AZITHROMYCIN 250 MG PO TABS: ORAL_TABLET | ORAL | Status: DC

## 2015-07-18 NOTE — Telephone Encounter (Signed)
Patient returned call, CB is [phone]

## 2015-07-18 NOTE — Telephone Encounter (Signed)
Spoke with pt and she states that she has sore throat, dry cough, wheeze/SOB when laying down. She denies f/n/v. She states that symptoms started last night. Pt is requesting abx be sent in. She has not taken anything for her symptoms as she is afraid to take anything that may elevate her BP since she has not had her medication in several days. She states that her pharmacy has been trying to get her Losartan refilled for 6 days with no response from [place]. Pt advised that SN is out of office so will send message to DOD for recommendations.   TP please advise. Thanks!

## 2015-07-18 NOTE — Telephone Encounter (Signed)
Can have Zpack #1 , tad , no refills.  Would hold onto this for few days as this may only be viral .  Use mucinex dm Twice daily  As needed   May try claritin As needed  Draingae.  Can refill Losartan with 5 refills.  May sure she is not having chest pain.  If sob is not getting better or worsens will need to come in for evaluation  Please contact office for sooner follow up if symptoms do not improve or worsen or seek emergency care

## 2015-07-18 NOTE — Telephone Encounter (Signed)
Pt returning call a/bout rx that was to have been called in for her said she is @ drug store now waiting, please advise.Virginia Casey

## 2015-07-18 NOTE — Telephone Encounter (Signed)
Pt aware of recs.  rx sent to pharmacy.  Nothing further needed.

## 2015-07-18 NOTE — Telephone Encounter (Signed)
LMTCB

## 2015-07-19 ENCOUNTER — Encounter (HOSPITAL_COMMUNITY): Payer: Self-pay

## 2015-07-21 ENCOUNTER — Encounter (HOSPITAL_COMMUNITY): Payer: Self-pay

## 2015-07-21 ENCOUNTER — Encounter: Payer: Self-pay | Admitting: Adult Health

## 2015-07-21 ENCOUNTER — Telehealth: Payer: Self-pay | Admitting: Pulmonary Disease

## 2015-07-21 ENCOUNTER — Ambulatory Visit (INDEPENDENT_AMBULATORY_CARE_PROVIDER_SITE_OTHER)
Admission: RE | Admit: 2015-07-21 | Discharge: 2015-07-21 | Disposition: A | Discharge status: Home / Self Care | Payer: Medicare Other | Source: Ambulatory Visit | Attending: Adult Health | Admitting: Adult Health

## 2015-07-21 ENCOUNTER — Ambulatory Visit (INDEPENDENT_AMBULATORY_CARE_PROVIDER_SITE_OTHER): Payer: Medicare Other | Admitting: Adult Health

## 2015-07-21 VITALS — BP 118/68 | HR 58 | Temp 98.3°F | Ht 62.0 in | Wt 156.0 lb

## 2015-07-21 DIAGNOSIS — R059 Cough, unspecified: Secondary | ICD-10-CM

## 2015-07-21 DIAGNOSIS — R05 Cough: Secondary | ICD-10-CM | POA: Diagnosis not present

## 2015-07-21 DIAGNOSIS — J45909 Unspecified asthma, uncomplicated: Secondary | ICD-10-CM | POA: Diagnosis not present

## 2015-07-21 DIAGNOSIS — J4531 Mild persistent asthma with (acute) exacerbation: Secondary | ICD-10-CM | POA: Diagnosis not present

## 2015-07-21 MED ORDER — PREDNISONE 10 MG PO TABS
ORAL_TABLET | ORAL | Status: DC
Start: 2015-07-21 — End: 2015-09-27

## 2015-07-21 MED ORDER — HYDROCODONE-HOMATROPINE 5-1.5 MG/5ML PO SYRP: 2.5000 mL | ORAL_SOLUTION | Freq: Every evening | ORAL | Status: DC | PRN

## 2015-07-21 MED ORDER — LEVALBUTEROL HCL 0.63 MG/3ML IN NEBU
0.6300 mg | INHALATION_SOLUTION | Freq: Once | RESPIRATORY_TRACT | Status: AC
  Administered 2015-07-21: 0.63 mg via RESPIRATORY_TRACT

## 2015-07-21 NOTE — Progress Notes (Signed)
Subjective:    Patient ID: Virginia Casey, female    DOB: 1924-10-15, 82 y.o.   MRN: [id number]  HPI 41 female with asthmatic bronchitis , HTN , CAD s/p stent, hyperlipidemia followed by Dr. Kriste Basque   07/21/2015 Acute OV Pt presents for an acute office visit.  Complains of chest tightness/congestion, sinus pressure/drainage, dry cough, SOB and wheezing at times for 1 week. Lost voice but no sore throat .  [name] Coughing all the time. No fever or chills. No discolored mucus .  Called in [place] on 5/16. Has 1 day left.  Denies any fever, nausea or vomiting. , hemoptyiss , edema.  CXR 03/30/15 with nad , chronic changes.    Past Medical History  Diagnosis Date  . HTN (hypertension)   . Atherosclerotic heart disease   . Atrial flutter, paroxysmal (HCC)   . PVD (peripheral vascular disease) (HCC)   . Hypercholesterolemia   . Hiatal hernia   . Diverticulosis of colon   . Colon polyp   . Vitamin D deficiency   . Anxiety   . Anemia   . Myocardial infarction (HCC) 2010  . Pneumonia     "once; years ago" (10/06/2012)  . Exertional shortness of breath   . History of blood transfusion ? 2010  . GERD (gastroesophageal reflux disease)   . DJD (degenerative joint disease)     "legs" (10/06/2012)   Current Outpatient Prescriptions on File Prior to Visit  Medication Sig Dispense Refill  . acetaminophen (TYLENOL) 325 MG tablet Take 325 mg by mouth every 6 (six) hours as needed. For pain    . ALPRAZolam (XANAX) 0.25 MG tablet take 1 tablet by mouth three times a day if needed 90 tablet 1  . amiodarone (PACERONE) 200 MG tablet Take 200 mg by mouth daily.     [name] amLODipine (NORVASC) 5 MG tablet Take 5 mg by mouth daily.    [name] aspirin 81 MG tablet Take 81 mg by mouth daily.     . clopidogrel (PLAVIX) 75 MG tablet Take 1 tablet (75 mg total) by mouth daily with breakfast. 30 tablet 3  . dicyclomine (BENTYL) 20 MG tablet Take 1/2-1 tablet by mouth up to 4 times a day as needed for abdominal cramping 30 tablet  2  . Ipratropium-Albuterol (COMBIVENT RESPIMAT) 20-100 MCG/ACT AERS respimat Inhale 1 puff into the lungs every 6 (six) hours. 4 g 11  . losartan (COZAAR) 100 MG tablet Take 1 tablet (100 mg total) by mouth daily. 90 tablet 1  . nitroGLYCERIN (NITROSTAT) 0.4 MG SL tablet Place 0.4 mg under the tongue every 5 (five) minutes as needed. May repeat x3     . pantoprazole (PROTONIX) 40 MG tablet take 1 tablet by mouth twice a day 60 tablet 11  . rosuvastatin (CRESTOR) 20 MG tablet Take 20 mg by mouth at bedtime.     . traMADol (ULTRAM) 50 MG tablet Take 0.5-1 tablets (25-50 mg total) by mouth every 8 (eight) hours as needed for moderate pain or severe pain. 15 tablet 0  . Vitamin D, Ergocalciferol, (DRISDOL) 50000 units CAPS capsule Take 1 capsule (50,000 Units total) by mouth every 7 (seven) days. 4 capsule 11   No current facility-administered medications on file prior to visit.      Review of Systems Constitutional:   No  weight loss, night sweats,  Fevers, chills,  +fatigue, or  lassitude.  HEENT:   No headaches,  Difficulty swallowing,  Tooth/dental problems, or  Sore throat,  No sneezing, itching, ear ache,  +nasal congestion, post nasal drip,   CV:  No chest pain,  Orthopnea, PND, swelling in lower extremities, anasarca, dizziness, palpitations, syncope.   GI  No heartburn, indigestion, abdominal pain, nausea, vomiting, diarrhea, change in bowel habits, loss of appetite, bloody stools.   Resp: .  No chest wall deformity  Skin: no rash or lesions.  GU: no dysuria, change in color of urine, no urgency or frequency.  No flank pain, no hematuria   MS:  No joint pain or swelling.  No decreased range of motion. + back pain.  Psych:  No change in mood or affect. No depression or anxiety.  No memory loss.         Objective:   Physical Exam Filed Vitals:   07/21/15 1125  BP: 118/68  Pulse: 58  Temp: 98.3 F (36.8 C)   GEN: A/Ox3; pleasant , NAD, elderly , hoarse     HEENT:  [place]/AT,  EACs-clear, TMs-wnl, NOSE-clear, THROAT-clear, no lesions, no postnasal drip or exudate noted.   NECK:  Supple w/ fair ROM; no JVD; normal carotid impulses w/o bruits; no thyromegaly or nodules palpated; no lymphadenopathy.no stridor   RESP  Few trace wheezing. .no accessory muscle use, no dullness to percussion  CARD:  RRR, no m/r/g  , no peripheral edema, pulses intact, no cyanosis or clubbing.  GI:   Soft & nt; nml bowel sounds; no organomegaly or masses detected.  Musco: Warm bil, no deformities or joint swelling noted.  Neuro: alert, no focal deficits noted.,   Skin: Warm, no lesions or rashes  Sowmya Partridge NP-C   [facility]  07/21/2015        Assessment & Plan:

## 2015-07-21 NOTE — Patient Instructions (Addendum)
Finish Zpack take as directed.   Mucinex DM Twice daily  As needed  Cough/congestion  Prednisone taper over next week.  Salt water gargles As needed   Hydromet 1/2 tsp At bedtime  As needed  Cough. May make you sleepy.  Please contact office for sooner follow up if symptoms do not improve or worsen or seek emergency care  Follow up Dr. Lenna Gilford  As planned in 2 months and As needed

## 2015-07-21 NOTE — Telephone Encounter (Signed)
Spoke with pt and she states that she has started the Zpak and is not improving. She states that the cough and congestion are worse and are now in her chest. She would like to know what else she can do. Advised pt that she would need to be seen since abx are not helping. Pt scheduled with TP @ 11:15. Nothing further needed.

## 2015-07-21 NOTE — Assessment & Plan Note (Signed)
Flare with URI  Check cxr today - however pt left prior to order sent . -called if can come back fine, if not call if not improving will need cxr.  xopenex neb given   Plan  Finish Zpack take as directed.   Mucinex DM Twice daily  As needed  Cough/congestion  Prednisone taper over next week.  Salt water gargles As needed   Hydromet 1/2 tsp At bedtime  As needed  Cough. May make you sleepy.  Please contact office for sooner follow up if symptoms do not improve or worsen or seek emergency care  Follow up Dr. Lenna Gilford  As planned in 2 months and As needed

## 2015-07-24 ENCOUNTER — Encounter (HOSPITAL_COMMUNITY): Payer: Self-pay

## 2015-07-26 ENCOUNTER — Other Ambulatory Visit: Payer: Self-pay | Admitting: Pulmonary Disease

## 2015-07-26 ENCOUNTER — Encounter (HOSPITAL_COMMUNITY): Payer: Self-pay

## 2015-07-26 MED ORDER — CLOPIDOGREL BISULFATE 75 MG PO TABS: 75.0000 mg | ORAL_TABLET | Freq: Every day | ORAL | Status: DC

## 2015-07-26 NOTE — Telephone Encounter (Signed)
Received faxed refill request from [facility] for Plavix 75mg  QD #30 Last ov w/ SN 1.26.17 Saw TP 5.19.17 for acute visit Plavix sent to pharmacy Will sign off

## 2015-07-28 ENCOUNTER — Encounter (HOSPITAL_COMMUNITY): Payer: Self-pay

## 2015-08-02 ENCOUNTER — Encounter (HOSPITAL_COMMUNITY)
Admission: RE | Admit: 2015-08-02 | Discharge: 2015-08-02 | Disposition: A | Discharge status: Home / Self Care | Payer: Self-pay | Source: Ambulatory Visit | Attending: Cardiology | Admitting: Cardiology

## 2015-08-04 ENCOUNTER — Encounter (HOSPITAL_COMMUNITY): Payer: Self-pay

## 2015-08-04 DIAGNOSIS — I213 ST elevation (STEMI) myocardial infarction of unspecified site: Secondary | ICD-10-CM | POA: Insufficient documentation

## 2015-08-04 DIAGNOSIS — I2510 Atherosclerotic heart disease of native coronary artery without angina pectoris: Secondary | ICD-10-CM | POA: Diagnosis not present

## 2015-08-04 DIAGNOSIS — I480 Paroxysmal atrial fibrillation: Secondary | ICD-10-CM | POA: Diagnosis not present

## 2015-08-04 DIAGNOSIS — I129 Hypertensive chronic kidney disease with stage 1 through stage 4 chronic kidney disease, or unspecified chronic kidney disease: Secondary | ICD-10-CM | POA: Diagnosis not present

## 2015-08-04 DIAGNOSIS — I252 Old myocardial infarction: Secondary | ICD-10-CM | POA: Diagnosis not present

## 2015-08-04 DIAGNOSIS — N189 Chronic kidney disease, unspecified: Secondary | ICD-10-CM | POA: Diagnosis not present

## 2015-08-07 ENCOUNTER — Encounter (HOSPITAL_COMMUNITY)
Admission: RE | Admit: 2015-08-07 | Discharge: 2015-08-07 | Disposition: A | Discharge status: Home / Self Care | Payer: Self-pay | Source: Ambulatory Visit | Attending: Cardiology | Admitting: Cardiology

## 2015-08-09 ENCOUNTER — Encounter (HOSPITAL_COMMUNITY)
Admission: RE | Admit: 2015-08-09 | Discharge: 2015-08-09 | Disposition: A | Discharge status: Home / Self Care | Payer: Self-pay | Source: Ambulatory Visit | Attending: Cardiology | Admitting: Cardiology

## 2015-08-11 ENCOUNTER — Encounter (HOSPITAL_COMMUNITY): Payer: Self-pay

## 2015-08-14 ENCOUNTER — Encounter (HOSPITAL_COMMUNITY)
Admission: RE | Admit: 2015-08-14 | Discharge: 2015-08-14 | Disposition: A | Discharge status: Home / Self Care | Payer: Self-pay | Source: Ambulatory Visit | Attending: Cardiology | Admitting: Cardiology

## 2015-08-16 ENCOUNTER — Encounter (HOSPITAL_COMMUNITY)
Admission: RE | Admit: 2015-08-16 | Discharge: 2015-08-16 | Disposition: A | Discharge status: Home / Self Care | Payer: Self-pay | Source: Ambulatory Visit | Attending: Cardiology | Admitting: Cardiology

## 2015-08-18 ENCOUNTER — Encounter (HOSPITAL_COMMUNITY)
Admission: RE | Admit: 2015-08-18 | Discharge: 2015-08-18 | Disposition: A | Discharge status: Home / Self Care | Payer: Self-pay | Source: Ambulatory Visit | Attending: Cardiology | Admitting: Cardiology

## 2015-08-21 ENCOUNTER — Encounter (HOSPITAL_COMMUNITY)
Admission: RE | Admit: 2015-08-21 | Discharge: 2015-08-21 | Disposition: A | Discharge status: Home / Self Care | Payer: Self-pay | Source: Ambulatory Visit | Attending: Cardiology | Admitting: Cardiology

## 2015-08-23 ENCOUNTER — Encounter (HOSPITAL_COMMUNITY)
Admission: RE | Admit: 2015-08-23 | Discharge: 2015-08-23 | Disposition: A | Discharge status: Home / Self Care | Payer: Self-pay | Source: Ambulatory Visit | Attending: Cardiology | Admitting: Cardiology

## 2015-08-25 ENCOUNTER — Encounter (HOSPITAL_COMMUNITY): Payer: Self-pay

## 2015-08-28 ENCOUNTER — Encounter (HOSPITAL_COMMUNITY)
Admission: RE | Admit: 2015-08-28 | Discharge: 2015-08-28 | Disposition: A | Discharge status: Home / Self Care | Payer: Self-pay | Source: Ambulatory Visit | Attending: Cardiology | Admitting: Cardiology

## 2015-08-30 ENCOUNTER — Encounter (HOSPITAL_COMMUNITY): Payer: Self-pay

## 2015-09-01 ENCOUNTER — Encounter (HOSPITAL_COMMUNITY): Payer: Self-pay

## 2015-09-04 ENCOUNTER — Encounter (HOSPITAL_COMMUNITY): Payer: Self-pay

## 2015-09-04 DIAGNOSIS — I213 ST elevation (STEMI) myocardial infarction of unspecified site: Secondary | ICD-10-CM | POA: Insufficient documentation

## 2015-09-06 ENCOUNTER — Encounter (HOSPITAL_COMMUNITY): Payer: Self-pay

## 2015-09-08 ENCOUNTER — Encounter (HOSPITAL_COMMUNITY): Payer: Self-pay

## 2015-09-11 ENCOUNTER — Encounter (HOSPITAL_COMMUNITY)
Admission: RE | Admit: 2015-09-11 | Discharge: 2015-09-11 | Disposition: A | Discharge status: Home / Self Care | Payer: Self-pay | Source: Ambulatory Visit | Attending: Cardiology | Admitting: Cardiology

## 2015-09-13 ENCOUNTER — Encounter (HOSPITAL_COMMUNITY)
Admission: RE | Admit: 2015-09-13 | Discharge: 2015-09-13 | Disposition: A | Discharge status: Home / Self Care | Payer: Self-pay | Source: Ambulatory Visit | Attending: Cardiology | Admitting: Cardiology

## 2015-09-15 ENCOUNTER — Encounter (HOSPITAL_COMMUNITY)
Admission: RE | Admit: 2015-09-15 | Discharge: 2015-09-15 | Disposition: A | Discharge status: Home / Self Care | Payer: Self-pay | Source: Ambulatory Visit | Attending: Cardiology | Admitting: Cardiology

## 2015-09-18 ENCOUNTER — Encounter (HOSPITAL_COMMUNITY)
Admission: RE | Admit: 2015-09-18 | Discharge: 2015-09-18 | Disposition: A | Discharge status: Home / Self Care | Payer: Self-pay | Source: Ambulatory Visit | Attending: Cardiology | Admitting: Cardiology

## 2015-09-20 ENCOUNTER — Encounter (HOSPITAL_COMMUNITY): Payer: Self-pay

## 2015-09-22 ENCOUNTER — Encounter (HOSPITAL_COMMUNITY): Payer: Self-pay

## 2015-09-25 ENCOUNTER — Encounter (HOSPITAL_COMMUNITY)
Admission: RE | Admit: 2015-09-25 | Discharge: 2015-09-25 | Disposition: A | Discharge status: Home / Self Care | Payer: Self-pay | Source: Ambulatory Visit | Attending: Cardiology | Admitting: Cardiology

## 2015-09-26 ENCOUNTER — Encounter: Payer: Self-pay | Admitting: Pulmonary Disease

## 2015-09-26 NOTE — Progress Notes (Signed)
HPI  Review of Systems  Physical Exam  Subjective:    Patient ID: Virginia Casey, female    DOB: 1924/12/27, 82 y.o.   MRN: [id number]  HPI 82 y/o BF here for a follow up visit... she has mult med problems as noted below...  ~  SEE PREV EPIC NOTES FOR OLDER DATA >>    CXR 7/13 showed normal heart size, largeHH, bibasilar atx, otherw clear...  EKG 7/13 showed SBrady, rate52, no acute changes...  Myoview 8/13 at [facility] showed no ischemia or infarction, normal wall motion, EF=70%...  LABS 7/13:  Chems- wnl;  CBC- wnl...   LABS 3/14:  FLP- at goals on Cres20;  Chems- wnl;  CBC- wnl;  TSH=2.11;  VitD=12 & Rec to start 50,000u weekly supplement...   CXR 7/14 showed cardiomeg, largeHH, NAD...   PFT's were attempted but pt absolutely could not do the simple spirometry...  LABS 7/14 showed Chems- wnl, CBC- ok x plat=113K  ~  March 23, 2013:  53mo ROV & Virginia Casey reports doing well, no new complaints or concerns...    Chol> on Cres20; FLP 1/15 showed TChol 133, TG 38, HDL 75, LDL 50- we don't have notes/ labs from [facility]....    Renal insuffic> on Amlod5, Losar100, Amio200; Labs 1/15 showed BUN=42, Cr=1.8.Marland KitchenMarland Kitchen She knows to avoid NSAIDs & incr fluids...    Anemia> Hg=12.3, MCV=78; needs to restart Fe daily & stay on this... [name] prob list, meds, xrays and labs> see below for updates >>   LABS 1/15:  FLP- at goals on Cres20;  Chems- ok x renal insuffic w/ BUN=42, Cr=1.8;  CBC- ok w/ Hg=12.3, MCV=78;  TSH=3.09;  VitD=50... REC to continue Cres20, avoid NSAIDs & incr fluids, start FeSO4 daily and continue VitD50K weekly...   ~  September 20, 2013:  79mo ROV & Virginia Casey noted diarrhea x3d last week for which she took some Pepto & now much improved... She reports doing well overall at age 9 w/o other complaints or concerns...  She's been doing Cardiac rehab & really likes it...    She continues regular f/u w/ Cards- DrHarwani on Amlod5, Losar100, Amio200, ASA.Plavix; BP= 130/80 & she denies CP, palpit,  SOB, edema, or cerebral ischemic symptoms...    She remains on Cres20 & FLP 1/15 looked great...     She is clinically & biochem euthyroid w/ TSH 1/15 = 3.09...    She has mild renal insuffic w/ Labs 7/15 showing BUN= 32, Cr= 1.7 which is stable since last Sep    Hx Anemia on Fe daily> Labs 7/15 showed Hg=13.2 (up 1pt), MCV=78, Fe=73 (15%sat) and rec to continue same meds & supplements... [name] prob list, meds, xrays and labs> see below for updates >>  REC to continue same meds and supplements including the Fe...  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     AB> on Combivent prn (hasn't needed in a long time); doing well, no resp infections or exacerbations...    HBP> on Losartan100 & Amlod5; BP= 132/86 & she denies CP, palpit, SOB, edema; rec to continue same meds + diet...    CAD> followed by [name] on ASA81, Plavix75; eval 7/14 w/ abn Myoview & subseq cath 8/14 showed in-stent restenosis in LAD, s/p PCI & back to baseline...    PAFlutter>  still on Amio200 per [facility]; she denies palpit, dizzy, ch in SOB, etc...    Cerebrovasc dis> on ASA81/ Plavix75; she denies cerebral ischemic symptoms...    Chol> on Cres20; FLP 1/16 shows TChol 154, TG 62, HDL 68, LDL 74; continue diet & Cres20...    Thyroid dis> followed by [name]; she has hx hyperthyroidism secondary to Amio & a prob multinod goiter; treated w/ Tapazole, then PTU; now off PTU & "everything is fine"; Labs 1/16 showed TSH= 2.31...    GI> HH, Divertics, Polyps> on Protonix40Bid to avoid Plavix interaction; doing satis w/o abd pain, n/v, c/d, blood seen...    Renal insuffic> on Amlod5, Losar100, Amio200; Labs 1/16 showed BUN=27, Cr=1.52 (improved)... She knows to avoid NSAIDs & incr fluids...    DJD, VitD defic> on Tylenol & VitD 50K per week since labs 3/14 showed VitD level  lower at 12; f/u VitD level 1/16= 28, therefore continue 50K weekly...    Anxiety> on Alpraz0.25 prn which really helps...    Anemia> on Fe w/ VitC; Labs 1/16 showed Hg=13.2, MCV=77, Fe= needs to be repeated; rec to continue supplements daily... [name] prob list, meds, xrays and labs> see below for updates >> Given 2015 Flu vaccine today, OK refills as requested.[name]  LABS 1/16:  FLP- within parameters on Cres20;  Chems- ok x Cr=1.52;  CBC- ok x MCV=77 & Plat=112;  TSH=2.31;  VitD=28... PLAN>> Continue same meds, refills given per request; rec to continue MVI, FeSO4 daily, & VitD 50K weekly...  ~  Jul 27, 2014:  67mo ROV & add-on appt requested for "stomach issues">  Virginia Casey is c/o "my divertics are acting up" over the last week;  Notes periumbil pain off & on, ?ppt events, lasts up to 1h, relief spont; she denies n/v, d/c/bl seen, no f/c/s; her ROS is otherw neg w/o CP, palpit, SOB, cough/ phlegm, etc...       GI> HH, Divertics, Polyps> on Protonix40Bid to avoid Plavix interaction; she has a giantHH measuring 10cm w/ erosions on last EGD 4/08 by DrSam; she was Hosp 11/10 w/ lower GIB believed due to divertics- colon by DrJacobs w/ mult divertics throughout & one adenomatous polyp removed from the sigmoid colon...       EXAM shows Afeb, VSS, O2sat=99% on RA;  HEENT- neg;  Chest- clear w/o w/r/r;  Cor- RR gr1/6SEM w/o r/g;  Abd- soft, nontender, w/o masses; Ext- neg w/o c/c/e... IMP/PLAN>>  Sounds like IBS & we discussed treating her cramping pain w/ BENTYL 20mg - 1/2 to 1 tab qid as needed; if symptoms persist we will request GI consultation...   ~  September 27, 2014:  82mo ROV & Virginia Casey reports much improved "can't complain she says... She continues to f/u w/ Cards- DrHarwani every 52mo; her GI complaints have resolved w/ the Bentyl & now back to baseline... She notes some arthritis pain but Tylenol helps... She is in good spirits!    EXAM shows Afeb, VSS, O2sat=96% on RA;  HEENT- neg;  Chest- clear w/o w/r/r;   Cor- RR gr1/6SEM w/o r/g;  Abd- soft, nontender, w/o masses; Ext- neg w/o c/c/e... [name] prob list, meds, xrays and labs>   IMP/PLAN>>  Stable on current med Rx; as noted she sees Cards Q77mo so we will plan ROV in 63mo, sooner if needed prn...  ~  March 30, 2015:  50mo ROV & Virginia Casey has mult Orthopedic complaints- left scapula/ shoulder/ arm pain for several days, no known trauma, "arthritis Tylenol" is helping some &  we discussed Rx w/ Tramadol & refer to Ortho when she is ready;  No other complaints or concerns;  She continues to see DrHarwani Q63mo, we do not have notes from him to review;  [name] the following medical problems during today's office visit >>     AB> on Combivent prn (hasn't needed in a long time); doing well, no resp infections or exacerbations...    HBP> on Losartan100 & Amlod5; BP= 136/78 & she denies CP, palpit, SOB, edema; rec to continue same meds + diet...    CAD> followed by [name] on ASA81, Plavix75; eval 7/14 w/ abn Myoview & subseq cath 8/14 showed in-stent restenosis in LAD, s/p PCI & back to baseline, we do not have recent notes...    PAFlutter> still on Amio200 per [facility]; she denies palpit, dizzy, ch in SOB, etc...    Cerebrovasc dis> on ASA81/ Plavix75; she denies cerebral ischemic symptoms...    Chol> on Cres20; FLP 1/17 shows TChol 145, TG 62, HDL 70, LDL 63; continue diet & Cres20...    Thyroid dis> followed by [name]; she has hx hyperthyroidism secondary to Amio & a prob multinod goiter; treated w/ Tapazole, then PTU; now off PTU & "everything is fine"; Labs 1/17 showed TSH= 4.00...    GI> HH, Divertics, Polyps> on Protonix40Bid to avoid Plavix interaction; doing satis w/o abd pain, n/v, c/d, blood seen...    Renal insuffic> on Amlod5, Losar100, Amio200; Labs 1/17 showed BUN=24, Cr=1.44 (improved)... She knows to avoid NSAIDs & incr fluids...    DJD, VitD defic> on Tylenol & VitD 50K per week since labs 3/14 showed VitD level lower at 12; f/u VitD  level 1/17= 42, therefore continue 50K weekly...    Anxiety> on Alpraz0.25 prn which really helps...    Anemia> off Fe w/ VitC now; Labs 1/17 showed Hg=13.6, MCV=78, Fe= needs to be repeated; rec to continue supplements daily... EXAM shows Afeb, VSS, O2sat=98% on RA;  HEENT- neg;  Chest- clear w/o w/r/r;  Cor- RR gr1/6SEM w/o r/g;  Abd- soft, nontender, w/o masses; Ext- neg w/o c/c/e...  CXR 03/31/15>  Cardiomegaly, low lung volumes w/ basialr atx, large HH noted, osteopenia and DJD in Tspine...  LABS 03/2015>  FLP- at goals on Cres20;  Chems- wnl x Cr=1.44;  CBC- ok w/ Hg=13.6 but MCV=78;  TSH=4.00;  VitD=42 IMP/PLAN>>  Virginia Casey is stable on current regimen; we will add Tramadol50 prn & encourage Ortho f/u when she is ready; continue Cards f/u DrHarwani & I've asked her to get notes sent to [place]...          Problem List:  Hearing Loss >> she saw DrCrossley 3/13 w/ a high freq hearing loss & he removed creumen impactions' they are holding off on hearing aides for now...  ASTHMATIC BRONCHITIS (ICD-466.0) - breathing is stable now- s/p recent exacerbation... ~  CXR 1/12 showed basilar atx & large HH, cardiomeg, osteopenia, NAD... ~  [facility] 5/12 w/ refractory AB exac & improved w/ standard therapy & brief course of Pred; CXR- similar, NAD.[name]. ~  CXR 7/13 showed norm heart size, large HH, clear lungs, NAD.[name]. ~  7/14:  She presented w/ a mild exac brought on by the stress of a trip to [place] etc=> Rx w/ COMBIVENT every 6h as needed... ~  CXR 7/14 showed cardiomeg, largeHH, NAD... We attempted PFTs but she was unable to perform... Os sats were 98% on RA... ~  She remains stable, denies breathing problems and hasn't needed  the Combivent...  HYPERTENSION (ICD-401.9) - now on LOSARTAN 100mg /d & AMLODIPINE 5mg /d (and off her prev Ramipril due to asthma & Imdur due to HA) ...  ~  8/12:  BP= 110/72 & not checking BP's at home; she denies visual changes, CP, palipit, syncope, dyspnea, edema, etc... ~   2/13:  BP= 122/82 & she is stable, mostly asymptomatic w/o CP, palpit, SOB, etc... ~  8/13:  BP= 124/86 & she remains stable... ~  3/14: on Losartan100 & off Amlod5; BP= 130/84 & she denies CP, palpit, SOB, edema; rec to continue same meds + diet. ~  7/14: on Losartan100 & off Amlod5; BP= 132/92 & she denies CP, palpit, or edema... ~  10/14: now on Losartan100 + Amlodipine5; BP= 118/88 & she remains asymptomatic... ~  7/15: on Amlod5, Losar100, Amio200, ASA.Plavix; BP= 130/80 & she denies CP, palpit, SOB, edema, or cerebral ischemic symptoms. ~  1/16: on Losartan100 & Amlod5; BP= 132/86 & she denies CP, palpit, SOB, edema; rec to continue same meds + diet. ~  5/16: on Losartan100 & Amlod5; BP= 110/70 & she remains asymptomatic.  ATHEROSCLEROTIC HEART DISEASE (ICD-414.00) & PERIPHERAL VASCULAR DISEASE (ICD-443.9) - regularly on ASA 81mg /d, & off Plavix... followed by [facility] for Cards (we do not have his notes to review med changes)>> ~  hosp in Jan08 w/ syncope and MRI Brain showed mild atrophy & sm vessel dis;  MRA showed basilar art narrowing & mod stenosis in RICA cavernous segment... CDopplers however were WNL w/ antegrade vertebral flow, and no signif ICA stenoses detected... ~  hosp Feb10 by [facility] w/ non-STEMI- s/p PTCA and stent in the LAD. ~  hosp 7/11 by [facility] w/ CP, Abn Myoview & cath w/ 70% midLAD in-stent resteosis; s/p PTCA & started back on Plavix... ~  she is in Cardiac Rehab & really likes it... ~  7/13:  She went to the ER w/ chest discomfort; ruled out for ischemia (EKG= SBrday, rate52, wnl/NAD) & followed up w/ DrHarwani> he did MYOVIEW 8/13 which was neg for ischemia or infarction, norm wall motion & EF=70%... ~  7/14:  She continues to f/u w/ [facility] her cardiologist;  She will see him soon to f/u from this [place] trip for additional test => Myoview was Abn & Cath 8/14 w/ in-stent restenosis, s/p PCI... ~  10/14: now on ASA81 + Plavix75 and enrolled in cardiac rehab  which she loves... (Note: Omep20Bid give via ER 9/14 is being changed to Protonix40Bid due to her Plavix Rx)... ~  She continues to f/u regularly w/ DrHarwani (we do not have notes from him...  ATRIAL FLUTTER, PAROXYSMAL (ICD-427.32) - this occured 2/10 following her cath, PTCA, stent... he diagnosed flutter w/ 2:1 block... currently taking AMIODARONE 200mg /d... ~  She remains in NSR & denies CP, palpit, SOB, etc...  HYPERCHOLESTEROLEMIA (ICD-272.0) - on CRESTOR 20mg /d... ~  [place] 11/08 showed TChol 161, TG 39, HDL 62, LDL 92... continue same med. ~  [place] 5/09 showed TChol 209, TG 70, HDL 74, LDL 124... rec- better diet, get weight down! ~  FLP in hosp 2/10 showed TChol 130, TG 69, HDL 45, LDL 71 ~  she reports that [facility] does fasting labs... ~  FLP 7/11 in hosp showed TChol 105, TG 30, HDL 66, LDL 33 ~  FLP 2/12 on Cres20 showed TChol 97, TG 38, HDL 50, LDL 39... copy to Cards ?decr to 10mg ? ~  FLP 2/13 on Cres20 showed TChol 118, TG 42, HDL 60, LDL 50... Continue  same. ~  [place] 3/14 on Cres20 showed TChol 128, TG 32, HDL 68, LDL 53  ~  FLP 1/15 on Cres20 showed TChol 133, TG 38, HDL 75, LDL 50 ~  FLP 1/16 on Cres20 showed TChol 154, TG 62, HDL 68, LDL 74  THYROID DISEASE >> Hx Amiodarone induced hyperthyroidism diagnosed 2/12 & referred to [facility], treated w/ PTU (now off) & DrHarwani tapered the Amio... ~  1/13:  She had f/u DrKumar> everything improve4d w/ reduction in Amio dose; clinically euthyroid & no goiter palpated; TFT's in his office were WNL==> TSH=3.74,  FreeT3=2.4 (2.0-4.4), FreeT4=0.91 (0.61-1.12) ~  Labs 3/14 showed TSH= 2.11 ~  Labs 1/15 showed TSH= 3.09 ~  Labs 1/16 showed TSH= 2.31  HIATAL HERNIA (ICD-553.3) - prev on Nexium & Carafate Liq- but changed to PEPCID 20mg Bid by [facility] due to need for Plavix... she has a giant HH measuring 10cm per DrSam, w/ erosions in the pouch... last EGD by DrSam 4/08... hernia is seen on her CXRs. ~  9/14: she went to the ER w/ Epig pain  & they felt it was reflux related due to her [place]; they switched Pepcid to Omep20Bid but she will need to change this to [place] due to the Plavix...  DIVERTICULOSIS OF COLON (ICD-562.10) & COLONIC POLYPS (ICD-211.3) ~  colonoscopy 11/06 showed divertics and 2- 79mm polyps... f/u planned in 4-28yrs... ~  Hosp 11/10 w/ lower GIB believed due to divertics... colonoscopy by DrJacobs showed severe divertics, no bleeding site, 67mm polyp (tubular adenoma) removed from sigmoid...  RENAL INSUFFICIENCY >> she knows to avoid NSAIDs and incr fluid intake... ~  Labs 1/15 showed BUN= 42, Cr= 1.8 ~  Labs 7/15 showed BUN= 32, Cr= 1.7 ~  Labs 1/16 showed BUN= 27, Cr= 1.52  DEGENERATIVE JOINT DISEASE (ICD-715.90) & HIP PAIN, RIGHT (ICD-719.45) - currently taking OTC meds only but DrDean rec Vicodin for as needed use (she prefers Tylenol)... Eval by DrDean 3/09 w/ bone-on-bone right hip, tried injection & pain meds... right THR done 12/09...  VITAMIN D DEFICIENCY (ICD-268.9) ~  labs 5/09 showed Vit D level = 9... pt started on 50K weekly but she switched on her own to 1000u/d OTC. ~  labs 2/12 showed Vit D level = 18... asked to incr Vit D supplement to 2000 u daily (she never did). ~  Labs 2/13 showed Vit D level = 20... Asked to finally incr VitD supplement to 2000u daily... ~  Labs 3/14 showed Vit D level = 12 & Rec to switch to 50K weekly Rx... ~  Labs 1/15 showed VitD level = 50 & rec to continue VitD 50K weekly... ~  Labs 1/16 showed VitD level = 28 & reminded to take the VitD 50K weekly...  ANXIETY (ICD-300.00) - she is requesting a mild nerve pill & we have written ALPRAZOLAM 0.25mg  Prn use...  ANEMIA (ICD-285.9) - prev on Niferex 150mg /d, now FeSO4 325mg /d w/ Vit C 500mg ... 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 ~  last blood count 11/08 showed Hg=13.7, Fe=56... ~  labs 5/09 showed Hg= 14.2.[name]. ~  labs 2/10 in hosp showed Hg= 13 ~  Hosp 11/10 w/ lower GIB, & Hg down to 8.6- tc 1u PC  up to 9.3.Marland KitchenMarland Kitchen ~  labs 12/10 showed Hg= 10.6.[name]. rec> continue oral Fe w/ VitC... ~  [facility] labs 7/11 showed Hg= 11.6 - 10.5, MCV= 70... rec> restart Fe. ~  labs 2/12 showed Hg= 12.4, MCV= 77, Fe= 56 (sat=10%)... continue Fe  w/ VitC. ~  Labs 5/12 in hosp showed Hg= 12.3, MCV= 68, & she continues on FeSO4 daily w/ VitC... ~  Labs 2/13 showed Hg=13.1, MCV=78, Fe=66; rec to continue supplements... ~  Labs 3/14 showed Hg= 13.1 ~  Labs 9/14 showed Hg= 12.8, MCV=74 ~  Labs 1/15 showed Hg= 12.3, MCV=78 and rec to restart FeSO4 325mg  daily... ~  Labs 7/15 showed Hg= 13.2, MCV=78, Fe=73 (15%sat)... rec to continue supplements. ~  Labs 1/16 showed Hg= 13.2, MCV=77, and reminded to continue daily supplements.  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 ~  Immuniz:  She had TDAP 03/2008;  Pneumovax-23 in 05/2009;  Given yearly Flu vaccines...   Past Surgical History:  Procedure Laterality Date  . CORONARY ANGIOPLASTY  10/06/2012  . CORONARY ANGIOPLASTY WITH STENT PLACEMENT  04/2008   "1; Dr Terrence Dupont" (10/06/2012)  . DILATION AND CURETTAGE OF UTERUS    . LEFT HEART CATHETERIZATION WITH CORONARY ANGIOGRAM N/A 10/06/2012   Procedure: LEFT HEART CATHETERIZATION WITH CORONARY ANGIOGRAM;  Surgeon: Clent Demark, MD;  Location: [facility] CATH LAB;  Service: Cardiovascular;  Laterality: N/A;  . TOTAL HIP ARTHROPLASTY Right 12/09   Dr Marlou Sa    Outpatient Encounter Prescriptions as of 03/30/2015  Medication Sig  . acetaminophen (TYLENOL) 325 MG tablet Take 325 mg by mouth every 6 (six) hours as needed. For pain  . amiodarone (PACERONE) 200 MG tablet Take 200 mg by mouth daily.   [name] amLODipine (NORVASC) 5 MG tablet Take 5 mg by mouth daily.  [name] aspirin 81 MG tablet Take 81 mg by mouth daily.   [name] dicyclomine (BENTYL) 20 MG tablet Take 1/2-1 tablet by mouth up to 4 times a day as needed for abdominal cramping  . Ipratropium-Albuterol (COMBIVENT RESPIMAT) 20-100  MCG/ACT AERS respimat Inhale 1 puff into the lungs every 6 (six) hours.  . nitroGLYCERIN (NITROSTAT) 0.4 MG SL tablet Place 0.4 mg under the tongue every 5 (five) minutes as needed. May repeat x3   . pantoprazole (PROTONIX) 40 MG tablet take 1 tablet by mouth twice a day  . rosuvastatin (CRESTOR) 20 MG tablet Take 20 mg by mouth at bedtime.   . [DISCONTINUED] ALPRAZolam (XANAX) 0.25 MG tablet take 1 tablet by mouth three times a day if needed  . [DISCONTINUED] clopidogrel (PLAVIX) 75 MG tablet Take 1 tablet (75 mg total) by mouth daily with breakfast.  . [DISCONTINUED] losartan (COZAAR) 100 MG tablet take 1 tablet by mouth once daily  . [DISCONTINUED] vitamin C (ASCORBIC ACID) 500 MG tablet Take 500 mg by mouth daily. With iron tablet   . [DISCONTINUED] Vitamin D, Ergocalciferol, (DRISDOL) 50000 UNITS CAPS capsule Take 1 capsule (50,000 Units total) by mouth once a week. (Patient not taking: Reported on 05/08/2015)  . [DISCONTINUED] ferrous gluconate (FERGON) 325 MG tablet Take 325 mg by mouth daily with breakfast. With vitamin C tablet   . [DISCONTINUED] ferrous sulfate 325 (65 FE) MG tablet Take 1 tablet (325 mg total) by mouth daily with breakfast.  . [DISCONTINUED] levofloxacin (LEVAQUIN) 500 MG tablet Take 1 tablet (500 mg total) by mouth daily. (Patient not taking: Reported on 07/27/2014)  . [DISCONTINUED] predniSONE (STERAPRED UNI-PAK) 5 MG TABS tablet Take 1 tablet (5 mg total) by mouth daily. (Patient not taking: Reported on 07/27/2014)  . [DISCONTINUED] traMADol (ULTRAM) 50 MG tablet Take 1 tablet (50 mg total) by mouth every 8 (eight) hours as needed.  . [  DISCONTINUED] traMADol (ULTRAM) 50 MG tablet Take 1 tablet (50 mg total) by mouth every 8 (eight) hours as needed.  . [DISCONTINUED] traMADol (ULTRAM) 50 MG tablet Take 1 tablet (50 mg total) by mouth every 8 (eight) hours as needed. (Patient not taking: Reported on 05/08/2015)   No facility-administered encounter medications on file as of  03/30/2015.     No Known Allergies    Immunization History  Administered Date(s) Administered  . Influenza Split 02/08/2011, 01/02/2012  . Influenza Whole 12/09/2007, 09/20/2009  . Influenza,inj,Quad PF,36+ Mos 12/21/2012, 03/23/2014, 04/21/2015  . Pneumococcal Polysaccharide-23 05/23/2009  . Tdap 03/04/2008    Current Medications, Allergies, Past Medical History, Past Surgical History, Family History, and Social History were reviewed in [employer] record.   Review of Systems         See HPI - all other systems neg except as noted... The patient complains of dyspnea on exertion.  The patient denies anorexia, fever, weight loss, weight gain, vision loss, decreased hearing, hoarseness, chest pain, syncope, peripheral edema, prolonged cough, headaches, hemoptysis, abdominal pain, melena, hematochezia, severe indigestion/heartburn, hematuria, incontinence, muscle weakness, suspicious skin lesions, transient blindness, difficulty walking, depression, unusual weight change, abnormal bleeding, enlarged lymph nodes, and angioedema.     Objective:   Physical Exam     WD, WN, 82 y/o BF in NAD... GENERAL:  Alert & oriented; pleasant & cooperative... HEENT:  [place]/AT, EOM-wnl, EACs-clear, TMs-wnl, NOSE-clear, THROAT-clear & wnl. NECK:  Supple w/ fairROM; no JVD; normal carotid impulses w/o bruits; no thyromegaly or nodules palpated; no lymphadenopathy. CHEST:  Coarse BS w/ no wheezing, no rales, no signs of consolidation. HEART:  Regular Rhythm; gr 1/6 SEM without rubs or gallops detected... ABDOMEN:  Soft & nontender; normal bowel sounds; no organomegaly or masses palpated... EXT: without deformities, mod arthritic changes; s/p right THR; no varicose veins/ +venous insuffic/  tr edema . BACK:  sl tender along right PSIS area... NEURO:  CN's intact; no focal neuro defict... DERM:  no lesions seen...  RADIOLOGY DATA:  Reviewed in the EPIC EMR & discussed w/ the  patient...  LABORATORY DATA:  Reviewed in the EPIC EMR & discussed w/ the patient...     Assessment & Plan:    AB>  Improved overall & she stopped all meds on her own=> using the COMBIVENT respimat prn but hasn't needed...  HBP>  Fair control on the Losartan & Amlodipine; asked to monitor BP at home...  ASHD>  Followed by [name] for Cards> cath 8/14 w/ LAD instent restenosis, s/p successful PCI & she is back to baseline; Note: ER 9/14 switched her Pepcid to Omep20Bid but her Plavix is critically important to help patency of the LAD stent; therefore we will switch the Omep to PANTOPRAZOLE40Bid...  AFlutter>  DrHarwani has her on Amio 200mg  daily; her prev thyroid problems have resolved...  CHOL>  On Cres20 w/ good control of FLP...  THYROID DISEASE>  Prev Amio induced hyperthyroidism managed by DrKumar (on PTU prev) & resolved on lower dose of Amio...  GI>  Hx HH/ GERD/ Divertics/ Polyps>  She is followed by DrJacobs for GI as above; current symptoms sound like IBS=> BENTYL20- 1/2 to 1 tab Qid helped...   GU> mild renal insuffic is stable w/ Cr=1.7-1.6 on current meds...  DJD>  She is followed by DrDean for Ortho...  VitD defic>  She remains on VitD 50K weekly, reminded to take it every wk...  Anemia>  Hg is OK but MCV is low, Fe  was 73 (improved) & she will continue her Fe & VitC supplements...  Anxiety>  Requesting a mild nerve pill & we wrote for Alprazolam...   Patient's Medications  New Prescriptions   TRAMADOL (ULTRAM) 50 MG TABLET    Take 0.5-1 tablets (25-50 mg total) by mouth every 8 (eight) hours as needed for moderate pain or severe pain.  Previous Medications   ACETAMINOPHEN (TYLENOL) 325 MG TABLET    Take 325 mg by mouth every 6 (six) hours as needed. For pain   AMIODARONE (PACERONE) 200 MG TABLET    Take 200 mg by mouth daily.    AMLODIPINE (NORVASC) 5 MG TABLET    Take 5 mg by mouth daily.   ASPIRIN 81 MG TABLET    Take 81 mg by mouth daily.    DICYCLOMINE  (BENTYL) 20 MG TABLET    Take 1/2-1 tablet by mouth up to 4 times a day as needed for abdominal cramping   IPRATROPIUM-ALBUTEROL (COMBIVENT RESPIMAT) 20-100 MCG/ACT AERS RESPIMAT    Inhale 1 puff into the lungs every 6 (six) hours.   NITROGLYCERIN (NITROSTAT) 0.4 MG SL TABLET    Place 0.4 mg under the tongue every 5 (five) minutes as needed. May repeat x3    PANTOPRAZOLE (PROTONIX) 40 MG TABLET    take 1 tablet by mouth twice a day   ROSUVASTATIN (CRESTOR) 20 MG TABLET    Take 20 mg by mouth at bedtime.   Modified Medications   Modified Medication Previous Medication   ALPRAZOLAM (XANAX) 0.25 MG TABLET ALPRAZolam (XANAX) 0.25 MG tablet      take 1 tablet by mouth three times a day if needed    take 1 tablet by mouth three times a day if needed   CLOPIDOGREL (PLAVIX) 75 MG TABLET clopidogrel (PLAVIX) 75 MG tablet      Take 1 tablet (75 mg total) by mouth daily with breakfast.    Take 1 tablet (75 mg total) by mouth daily with breakfast.   LOSARTAN (COZAAR) 100 MG TABLET losartan (COZAAR) 100 MG tablet      Take 1 tablet (100 mg total) by mouth daily.    take 1 tablet by mouth once daily   VITAMIN D, ERGOCALCIFEROL, (DRISDOL) 50000 UNITS CAPS CAPSULE Vitamin D, Ergocalciferol, (DRISDOL) 50000 units CAPS capsule      Take 1 capsule (50,000 Units total) by mouth every 7 (seven) days.    Take 1 capsule by mouth daily.  Discontinued Medications   FERROUS GLUCONATE (FERGON) 325 MG TABLET    Take 325 mg by mouth daily with breakfast. With vitamin C tablet    FERROUS SULFATE 325 (65 FE) MG TABLET    Take 1 tablet (325 mg total) by mouth daily with breakfast.   LEVOFLOXACIN (LEVAQUIN) 500 MG TABLET    Take 1 tablet (500 mg total) by mouth daily.   PREDNISONE (STERAPRED UNI-PAK) 5 MG TABS TABLET    Take 1 tablet (5 mg total) by mouth daily.   VITAMIN C (ASCORBIC ACID) 500 MG TABLET    Take 500 mg by mouth daily. With iron tablet    VITAMIN D, ERGOCALCIFEROL, (DRISDOL) 50000 UNITS CAPS CAPSULE    Take 1  capsule (50,000 Units total) by mouth once a week.

## 2015-09-27 ENCOUNTER — Ambulatory Visit (INDEPENDENT_AMBULATORY_CARE_PROVIDER_SITE_OTHER): Payer: Medicare Other | Admitting: Pulmonary Disease

## 2015-09-27 ENCOUNTER — Encounter (HOSPITAL_COMMUNITY): Payer: Self-pay

## 2015-09-27 ENCOUNTER — Encounter: Payer: Self-pay | Admitting: Pulmonary Disease

## 2015-09-27 VITALS — BP 110/76 | HR 58 | Temp 97.8°F | Ht 61.0 in | Wt 154.2 lb

## 2015-09-27 DIAGNOSIS — M545 Low back pain, unspecified: Secondary | ICD-10-CM

## 2015-09-27 DIAGNOSIS — I2510 Atherosclerotic heart disease of native coronary artery without angina pectoris: Secondary | ICD-10-CM

## 2015-09-27 DIAGNOSIS — M159 Polyosteoarthritis, unspecified: Secondary | ICD-10-CM

## 2015-09-27 DIAGNOSIS — M150 Primary generalized (osteo)arthritis: Secondary | ICD-10-CM

## 2015-09-27 DIAGNOSIS — I10 Essential (primary) hypertension: Secondary | ICD-10-CM | POA: Diagnosis not present

## 2015-09-27 DIAGNOSIS — E559 Vitamin D deficiency, unspecified: Secondary | ICD-10-CM

## 2015-09-27 DIAGNOSIS — K449 Diaphragmatic hernia without obstruction or gangrene: Secondary | ICD-10-CM | POA: Diagnosis not present

## 2015-09-27 DIAGNOSIS — I4892 Unspecified atrial flutter: Secondary | ICD-10-CM | POA: Diagnosis not present

## 2015-09-27 DIAGNOSIS — F411 Generalized anxiety disorder: Secondary | ICD-10-CM

## 2015-09-27 DIAGNOSIS — K5730 Diverticulosis of large intestine without perforation or abscess without bleeding: Secondary | ICD-10-CM

## 2015-09-27 NOTE — Progress Notes (Signed)
HPI  Review of Systems  Physical Exam  Subjective:    Patient ID: Virginia Casey, female    DOB: 1924/12/27, 82 y.o.   MRN: [id number]  HPI 82 y/o BF here for a follow up visit... she has mult med problems as noted below...  ~  SEE PREV EPIC NOTES FOR OLDER DATA >>    CXR 7/13 showed normal heart size, largeHH, bibasilar atx, otherw clear...  EKG 7/13 showed SBrady, rate52, no acute changes...  Myoview 8/13 at [facility] showed no ischemia or infarction, normal wall motion, EF=70%...  LABS 7/13:  Chems- wnl;  CBC- wnl...   LABS 3/14:  FLP- at goals on Cres20;  Chems- wnl;  CBC- wnl;  TSH=2.11;  VitD=12 & Rec to start 50,000u weekly supplement...   CXR 7/14 showed cardiomeg, largeHH, NAD...   PFT's were attempted but pt absolutely could not do the simple spirometry...  LABS 7/14 showed Chems- wnl, CBC- ok x plat=113K  ~  March 23, 2013:  53mo ROV & Virginia Casey reports doing well, no new complaints or concerns...    Chol> on Cres20; FLP 1/15 showed TChol 133, TG 38, HDL 75, LDL 50- we don't have notes/ labs from [facility]....    Renal insuffic> on Amlod5, Losar100, Amio200; Labs 1/15 showed BUN=42, Cr=1.8.Marland KitchenMarland Kitchen She knows to avoid NSAIDs & incr fluids...    Anemia> Hg=12.3, MCV=78; needs to restart Fe daily & stay on this... [name] prob list, meds, xrays and labs> see below for updates >>   LABS 1/15:  FLP- at goals on Cres20;  Chems- ok x renal insuffic w/ BUN=42, Cr=1.8;  CBC- ok w/ Hg=12.3, MCV=78;  TSH=3.09;  VitD=50... REC to continue Cres20, avoid NSAIDs & incr fluids, start FeSO4 daily and continue VitD50K weekly...   ~  September 20, 2013:  79mo ROV & Virginia Casey noted diarrhea x3d last week for which she took some Pepto & now much improved... She reports doing well overall at age 9 w/o other complaints or concerns...  She's been doing Cardiac rehab & really likes it...    She continues regular f/u w/ Cards- DrHarwani on Amlod5, Losar100, Amio200, ASA.Plavix; BP= 130/80 & she denies CP, palpit,  SOB, edema, or cerebral ischemic symptoms...    She remains on Cres20 & FLP 1/15 looked great...     She is clinically & biochem euthyroid w/ TSH 1/15 = 3.09...    She has mild renal insuffic w/ Labs 7/15 showing BUN= 32, Cr= 1.7 which is stable since last Sep    Hx Anemia on Fe daily> Labs 7/15 showed Hg=13.2 (up 1pt), MCV=78, Fe=73 (15%sat) and rec to continue same meds & supplements... [name] prob list, meds, xrays and labs> see below for updates >>  REC to continue same meds and supplements including the Fe...  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     AB> on Combivent prn (hasn't needed in a long time); doing well, no resp infections or exacerbations...    HBP> on Losartan100 & Amlod5; BP= 132/86 & she denies CP, palpit, SOB, edema; rec to continue same meds + diet...    CAD> followed by [name] on ASA81, Plavix75; eval 7/14 w/ abn Myoview & subseq cath 8/14 showed in-stent restenosis in LAD, s/p PCI & back to baseline...    PAFlutter>  still on Amio200 per [facility]; she denies palpit, dizzy, ch in SOB, etc...    Cerebrovasc dis> on ASA81/ Plavix75; she denies cerebral ischemic symptoms...    Chol> on Cres20; FLP 1/16 shows TChol 154, TG 62, HDL 68, LDL 74; continue diet & Cres20...    Thyroid dis> followed by [name]; she has hx hyperthyroidism secondary to Amio & a prob multinod goiter; treated w/ Tapazole, then PTU; now off PTU & "everything is fine"; Labs 1/16 showed TSH= 2.31...    GI> HH, Divertics, Polyps> on Protonix40Bid to avoid Plavix interaction; doing satis w/o abd pain, n/v, c/d, blood seen...    Renal insuffic> on Amlod5, Losar100, Amio200; Labs 1/16 showed BUN=27, Cr=1.52 (improved)... She knows to avoid NSAIDs & incr fluids...    DJD, VitD defic> on Tylenol & VitD 50K per week since labs 3/14 showed VitD level  lower at 12; f/u VitD level 1/16= 28, therefore continue 50K weekly...    Anxiety> on Alpraz0.25 prn which really helps...    Anemia> on Fe w/ VitC; Labs 1/16 showed Hg=13.2, MCV=77, Fe= needs to be repeated; rec to continue supplements daily... [name] prob list, meds, xrays and labs> see below for updates >> Given 2015 Flu vaccine today, OK refills as requested.[name]  LABS 1/16:  FLP- within parameters on Cres20;  Chems- ok x Cr=1.52;  CBC- ok x MCV=77 & Plat=112;  TSH=2.31;  VitD=28... PLAN>> Continue same meds, refills given per request; rec to continue MVI, FeSO4 daily, & VitD 50K weekly...  ~  Jul 27, 2014:  67mo ROV & add-on appt requested for "stomach issues">  Virginia Casey is c/o "my divertics are acting up" over the last week;  Notes periumbil pain off & on, ?ppt events, lasts up to 1h, relief spont; she denies n/v, d/c/bl seen, no f/c/s; her ROS is otherw neg w/o CP, palpit, SOB, cough/ phlegm, etc...       GI> HH, Divertics, Polyps> on Protonix40Bid to avoid Plavix interaction; she has a giantHH measuring 10cm w/ erosions on last EGD 4/08 by DrSam; she was Hosp 11/10 w/ lower GIB believed due to divertics- colon by DrJacobs w/ mult divertics throughout & one adenomatous polyp removed from the sigmoid colon...       EXAM shows Afeb, VSS, O2sat=99% on RA;  HEENT- neg;  Chest- clear w/o w/r/r;  Cor- RR gr1/6SEM w/o r/g;  Abd- soft, nontender, w/o masses; Ext- neg w/o c/c/e... IMP/PLAN>>  Sounds like IBS & we discussed treating her cramping pain w/ BENTYL 20mg - 1/2 to 1 tab qid as needed; if symptoms persist we will request GI consultation...   ~  September 27, 2014:  82mo ROV & Virginia Casey reports much improved "can't complain she says... She continues to f/u w/ Cards- DrHarwani every 52mo; her GI complaints have resolved w/ the Bentyl & now back to baseline... She notes some arthritis pain but Tylenol helps... She is in good spirits!    EXAM shows Afeb, VSS, O2sat=96% on RA;  HEENT- neg;  Chest- clear w/o w/r/r;   Cor- RR gr1/6SEM w/o r/g;  Abd- soft, nontender, w/o masses; Ext- neg w/o c/c/e... [name] prob list, meds, xrays and labs>   IMP/PLAN>>  Stable on current med Rx; as noted she sees Cards Q77mo so we will plan ROV in 63mo, sooner if needed prn...  ~  March 30, 2015:  50mo ROV & Virginia Casey has mult Orthopedic complaints- left scapula/ shoulder/ arm pain for several days, no known trauma, "arthritis Tylenol" is helping some &  we discussed Rx w/ Tramadol & refer to Ortho when she is ready;  No other complaints or concerns;  She continues to see DrHarwani Q46mo, we do not have notes from him to review;  [name] the following medical problems during today's office visit >>     AB> on Combivent prn (hasn't needed in a long time); doing well, no resp infections or exacerbations...    HBP> on Losartan100 & Amlod5; BP= 136/78 & she denies CP, palpit, SOB, edema; rec to continue same meds + diet...    CAD> followed by [name] on ASA81, Plavix75; eval 7/14 w/ abn Myoview & subseq cath 8/14 showed in-stent restenosis in LAD, s/p PCI & back to baseline, we do not have recent notes...    PAFlutter> still on Amio200 per [facility]; she denies palpit, dizzy, ch in SOB, etc...    Cerebrovasc dis> on ASA81/ Plavix75; she denies cerebral ischemic symptoms...    Chol> on Cres20; FLP 1/17 shows TChol 145, TG 62, HDL 70, LDL 63; continue diet & Cres20...    Thyroid dis> followed by [name]; she has hx hyperthyroidism secondary to Amio & a prob multinod goiter; treated w/ Tapazole, then PTU; now off PTU & "everything is fine"; Labs 1/17 showed TSH= 4.00...    GI> HH, Divertics, Polyps> on Protonix40Bid to avoid Plavix interaction; doing satis w/o abd pain, n/v, c/d, blood seen...    Renal insuffic> on Amlod5, Losar100, Amio200; Labs 1/17 showed BUN=24, Cr=1.44 (improved)... She knows to avoid NSAIDs & incr fluids...    DJD, VitD defic> on Tylenol & VitD 50K per week since labs 3/14 showed VitD level lower at 12; f/u VitD  level 1/17= 42, therefore continue 50K weekly...    Anxiety> on Alpraz0.25 prn which really helps...    Anemia> off Fe w/ VitC now; Labs 1/17 showed Hg=13.6, MCV=78, Fe= needs to be repeated; rec to continue supplements daily... EXAM shows Afeb, VSS, O2sat=98% on RA;  HEENT- neg;  Chest- clear w/o w/r/r;  Cor- RR gr1/6SEM w/o r/g;  Abd- soft, nontender, w/o masses; Ext- neg w/o c/c/e...  CXR 03/31/15>  Cardiomegaly, low lung volumes w/ basialr atx, large HH noted, osteopenia and DJD in Tspine...  LABS 03/2015>  FLP- at goals on Cres20;  Chems- wnl x Cr=1.44;  CBC- ok w/ Hg=13.6 but MCV=78;  TSH=4.00;  VitD=42 IMP/PLAN>>  Virginia Casey is stable on current regimen; we will add Tramadol50 prn & encourage Ortho f/u when she is ready; continue Cards f/u DrHarwani & I've asked her to get notes sent to [place]...   ~  September 27, 2015:  10mo ROV & medical follow up-- Virginia Casey relates several problems during the 64mo interval (outlined below but feeling better now)> 1) she saw TP 07/21/15 for an acute visit w/ sinus & chest congestion, cough, incr SOB & wheezing- given ZPak, Pred, Mucinex, Hydromet & improved... 2) she fell 05/2015 (bathmat slipped) & went to the ER> evalo showed CT Head- ok but CT Cspine w/ small (10%) compression fx T1 & T2, no neuro symptoms and pain resolved w/ Tramadol; XRays of Lspine, knees, hips- all OK. 3) she continues to f/u w/ DrHarwani regularly Q14mo & he does blood work  but we do not have notes from him; she reports "everything was good & he was happy"...    Breathing is good; she has Combivent for prn use...    BP remains stable on Amlod5 & Losar100; BP=110/76 today & she denies CP, palpit, dizzy/syncope, edema...    She  has CAD, hx AFlutter, known cerebrovasc dis followed by [facility] on Amio200, ASA/Plavix... EXAM shows Afeb, VSS, O2sat=97% on RA;  HEENT- neg, mallampati2;  Chest- clear now w/o w/r/r;  Cor- RR gr1/6SEM w/o r/g;  Abd- soft, nontender, w/o masses; Ext- neg w/o c/c/e... IMP/PLAN>>   Virginia Casey is stable at 82 y/o w/ mult medical issues as listed; we reviewed her meds and discussed need for medication compliance; no changes made today & she will f/u in 90mo w/ fasting blood work.          Problem List:  Hearing Loss >> she saw DrCrossley 3/13 w/ a high freq hearing loss & he removed creumen impactions' they are holding off on hearing aides for now...  ASTHMATIC BRONCHITIS (ICD-466.0) - breathing is stable now- s/p recent exacerbation... ~  CXR 1/12 showed basilar atx & large HH, cardiomeg, osteopenia, NAD... ~  [facility] 5/12 w/ refractory AB exac & improved w/ standard therapy & brief course of Pred; CXR- similar, NAD.[name]. ~  CXR 7/13 showed norm heart size, large HH, clear lungs, NAD.[name]. ~  7/14:  She presented w/ a mild exac brought on by the stress of a trip to [place] etc=> Rx w/ COMBIVENT every 6h as needed... ~  CXR 7/14 showed cardiomeg, largeHH, NAD... We attempted PFTs but she was unable to perform... Os sats were 98% on RA... ~  She remains stable, denies breathing problems and hasn't needed the Combivent...  HYPERTENSION (ICD-401.9) - now on LOSARTAN 100mg /d & AMLODIPINE 5mg /d (and off her prev Ramipril due to asthma & Imdur due to HA) ...  ~  8/12:  BP= 110/72 & not checking BP's at home; she denies visual changes, CP, palipit, syncope, dyspnea, edema, etc... ~  2/13:  BP= 122/82 & she is stable, mostly asymptomatic w/o CP, palpit, SOB, etc... ~  8/13:  BP= 124/86 & she remains stable... ~  3/14: on Losartan100 & off Amlod5; BP= 130/84 & she denies CP, palpit, SOB, edema; rec to continue same meds + diet. ~  7/14: on Losartan100 & off Amlod5; BP= 132/92 & she denies CP, palpit, or edema... ~  10/14: now on Losartan100 + Amlodipine5; BP= 118/88 & she remains asymptomatic... ~  7/15: on Amlod5, Losar100, Amio200, ASA.Plavix; BP= 130/80 & she denies CP, palpit, SOB, edema, or cerebral ischemic symptoms. ~  1/16: on Losartan100 & Amlod5; BP= 132/86 & she denies CP,  palpit, SOB, edema; rec to continue same meds + diet. ~  5/16: on Losartan100 & Amlod5; BP= 110/70 & she remains asymptomatic.  ATHEROSCLEROTIC HEART DISEASE (ICD-414.00) & PERIPHERAL VASCULAR DISEASE (ICD-443.9) - regularly on ASA 81mg /d, & off Plavix... followed by [facility] for Cards (we do not have his notes to review med changes)>> ~  hosp in Jan08 w/ syncope and MRI Brain showed mild atrophy & sm vessel dis;  MRA showed basilar art narrowing & mod stenosis in RICA cavernous segment... CDopplers however were WNL w/ antegrade vertebral flow, and no signif ICA stenoses detected... ~  hosp Feb10 by [facility] w/ non-STEMI- s/p PTCA and stent in the LAD. ~  hosp 7/11 by [facility] w/ CP, Abn Myoview & cath w/ 70% midLAD in-stent resteosis; s/p PTCA & started back on Plavix... ~  she is in Cardiac Rehab & really likes it... ~  7/13:  She went to the ER w/ chest discomfort; ruled out for ischemia (EKG= SBrday, rate52, wnl/NAD) & followed up w/ DrHarwani> he did MYOVIEW 8/13 which was neg for ischemia or  infarction, norm wall motion & EF=70%... ~  7/14:  She continues to f/u w/ [facility] her cardiologist;  She will see him soon to f/u from this [place] trip for additional test => Myoview was Abn & Cath 8/14 w/ in-stent restenosis, s/p PCI... ~  10/14: now on ASA81 + Plavix75 and enrolled in cardiac rehab which she loves... (Note: Omep20Bid give via ER 9/14 is being changed to Protonix40Bid due to her Plavix Rx)... ~  She continues to f/u regularly w/ DrHarwani (we do not have notes from him...  ATRIAL FLUTTER, PAROXYSMAL (ICD-427.32) - this occured 2/10 following her cath, PTCA, stent... he diagnosed flutter w/ 2:1 block... currently taking AMIODARONE 200mg /d... ~  She remains in NSR & denies CP, palpit, SOB, etc...  HYPERCHOLESTEROLEMIA (ICD-272.0) - on CRESTOR 20mg /d... ~  [place] 11/08 showed TChol 161, TG 39, HDL 62, LDL 92... continue same med. ~  [place] 5/09 showed TChol 209, TG 70, HDL 74, LDL 124...  rec- better diet, get weight down! ~  FLP in hosp 2/10 showed TChol 130, TG 69, HDL 45, LDL 71 ~  she reports that [facility] does fasting labs... ~  FLP 7/11 in hosp showed TChol 105, TG 30, HDL 66, LDL 33 ~  FLP 2/12 on Cres20 showed TChol 97, TG 38, HDL 50, LDL 39... copy to Cards ?decr to 10mg ? ~  FLP 2/13 on Cres20 showed TChol 118, TG 42, HDL 60, LDL 50... Continue same. ~  [place] 3/14 on Cres20 showed TChol 128, TG 32, HDL 68, LDL 53  ~  FLP 1/15 on Cres20 showed TChol 133, TG 38, HDL 75, LDL 50 ~  FLP 1/16 on Cres20 showed TChol 154, TG 62, HDL 68, LDL 74  THYROID DISEASE >> Hx Amiodarone induced hyperthyroidism diagnosed 2/12 & referred to [facility], treated w/ PTU (now off) & DrHarwani tapered the Amio... ~  1/13:  She had f/u DrKumar> everything improve4d w/ reduction in Amio dose; clinically euthyroid & no goiter palpated; TFT's in his office were WNL==> TSH=3.74,  FreeT3=2.4 (2.0-4.4), FreeT4=0.91 (0.61-1.12) ~  Labs 3/14 showed TSH= 2.11 ~  Labs 1/15 showed TSH= 3.09 ~  Labs 1/16 showed TSH= 2.31  HIATAL HERNIA (ICD-553.3) - prev on Nexium & Carafate Liq- but changed to PEPCID 20mg Bid by [facility] due to need for Plavix... she has a giant HH measuring 10cm per DrSam, w/ erosions in the pouch... last EGD by DrSam 4/08... hernia is seen on her CXRs. ~  9/14: she went to the ER w/ Epig pain & they felt it was reflux related due to her [place]; they switched Pepcid to Omep20Bid but she will need to change this to [place] due to the Plavix...  DIVERTICULOSIS OF COLON (ICD-562.10) & COLONIC POLYPS (ICD-211.3) ~  colonoscopy 11/06 showed divertics and 2- 48mm polyps... f/u planned in 4-69yrs... ~  Hosp 11/10 w/ lower GIB believed due to divertics... colonoscopy by DrJacobs showed severe divertics, no bleeding site, 34mm polyp (tubular adenoma) removed from sigmoid...  RENAL INSUFFICIENCY >> she knows to avoid NSAIDs and incr fluid intake... ~  Labs 1/15 showed BUN= 42, Cr= 1.8 ~  Labs  7/15 showed BUN= 32, Cr= 1.7 ~  Labs 1/16 showed BUN= 27, Cr= 1.52  DEGENERATIVE JOINT DISEASE (ICD-715.90) & HIP PAIN, RIGHT (ICD-719.45) - currently taking OTC meds only but DrDean rec Vicodin for as needed use (she prefers Tylenol)... Eval by DrDean 3/09 w/ bone-on-bone right hip, tried injection & pain meds... right THR done 12/09...  VITAMIN D DEFICIENCY (  ICD-268.9) ~  labs 5/09 showed Vit D level = 9... pt started on 50K weekly but she switched on her own to 1000u/d OTC. ~  labs 2/12 showed Vit D level = 18... asked to incr Vit D supplement to 2000 u daily (she never did). ~  Labs 2/13 showed Vit D level = 20... Asked to finally incr VitD supplement to 2000u daily... ~  Labs 3/14 showed Vit D level = 12 & Rec to switch to 50K weekly Rx... ~  Labs 1/15 showed VitD level = 50 & rec to continue VitD 50K weekly... ~  Labs 1/16 showed VitD level = 28 & reminded to take the VitD 50K weekly...  ANXIETY (ICD-300.00) - she is requesting a mild nerve pill & we have written ALPRAZOLAM 0.25mg  Prn use...  ANEMIA (ICD-285.9) - prev on Niferex 150mg /d, now FeSO4 325mg /d w/ Vit C 500mg ... 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 ~  last blood count 11/08 showed Hg=13.7, Fe=56... ~  labs 5/09 showed Hg= 14.2.[name]. ~  labs 2/10 in hosp showed Hg= 13 ~  Hosp 11/10 w/ lower GIB, & Hg down to 8.6- tc 1u PC up to 9.3.Marland KitchenMarland Kitchen ~  labs 12/10 showed Hg= 10.6.[name]. rec> continue oral Fe w/ VitC... ~  [facility] labs 7/11 showed Hg= 11.6 - 10.5, MCV= 70... rec> restart Fe. ~  labs 2/12 showed Hg= 12.4, MCV= 77, Fe= 56 (sat=10%)... continue Fe w/ VitC. ~  Labs 5/12 in hosp showed Hg= 12.3, MCV= 68, & she continues on FeSO4 daily w/ VitC... ~  Labs 2/13 showed Hg=13.1, MCV=78, Fe=66; rec to continue supplements... ~  Labs 3/14 showed Hg= 13.1 ~  Labs 9/14 showed Hg= 12.8, MCV=74 ~  Labs 1/15 showed Hg= 12.3, MCV=78 and rec to restart FeSO4 325mg  daily... ~  Labs 7/15 showed Hg= 13.2, MCV=78, Fe=73 (15%sat)... rec to  continue supplements. ~  Labs 1/16 showed Hg= 13.2, MCV=77, and reminded to continue daily supplements.  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 ~  Immuniz:  She had TDAP 03/2008;  Pneumovax-23 in 05/2009;  Given yearly Flu vaccines...   Past Surgical History:  Procedure Laterality Date  . CORONARY ANGIOPLASTY  10/06/2012  . CORONARY ANGIOPLASTY WITH STENT PLACEMENT  04/2008   "1; Dr Terrence Dupont" (10/06/2012)  . DILATION AND CURETTAGE OF UTERUS    . LEFT HEART CATHETERIZATION WITH CORONARY ANGIOGRAM N/A 10/06/2012   Procedure: LEFT HEART CATHETERIZATION WITH CORONARY ANGIOGRAM;  Surgeon: Clent Demark, MD;  [facility] C[facility] LAB;  Service: Cardiovascular;  Laterality: N/A;  . TOTAL HIP ARTHROPLASTY Right 12/09   Dr Marlou Sa    Outpatient Encounter Prescriptions as of 09/27/2015  Medication Sig  . acetaminophen (TYLENOL) 325 MG tablet Take 325 mg by mouth every 6 (six) hours as needed. For pain  . ALPRAZolam (XANAX) 0.25 MG tablet take 1 tablet by mouth three times a day if needed  . amiodarone (PACERONE) 200 MG tablet Take 200 mg by mouth daily.   [name] amLODipine (NORVASC) 5 MG tablet Take 5 mg by mouth daily.  [name] aspirin 81 MG tablet Take 81 mg by mouth daily.   . clopidogrel (PLAVIX) 75 MG tablet Take 1 tablet (75 mg total) by mouth daily with breakfast.  . dicyclomine (BENTYL) 20 MG tablet Take 1/2-1 tablet by mouth up to 4 times a day as needed for abdominal cramping  . HYDROcodone-homatropine (HYDROMET) 5-1.5 MG/5ML  syrup Take 2.5 mLs by mouth at bedtime as needed for cough.  . Ipratropium-Albuterol (COMBIVENT RESPIMAT) 20-100 MCG/ACT AERS respimat Inhale 1 puff into the lungs every 6 (six) hours.  [name] losartan (COZAAR) 100 MG tablet Take 1 tablet (100 mg total) by mouth daily.  . nitroGLYCERIN (NITROSTAT) 0.4 MG SL tablet Place 0.4 mg under the tongue every 5 (five) minutes as needed. May repeat x3   . pantoprazole (PROTONIX) 40 MG  tablet take 1 tablet by mouth twice a day  . rosuvastatin (CRESTOR) 20 MG tablet Take 20 mg by mouth at bedtime.   . traMADol (ULTRAM) 50 MG tablet Take 0.5-1 tablets (25-50 mg total) by mouth every 8 (eight) hours as needed for moderate pain or severe pain.  . Vitamin D, Ergocalciferol, (DRISDOL) 50000 units CAPS capsule Take 1 capsule (50,000 Units total) by mouth every 7 (seven) days.  . [DISCONTINUED] predniSONE (DELTASONE) 10 MG tablet 4 tabs for 2 days, then 3 tabs for 2 days, 2 tabs for 2 days, then 1 tab for 2 days, then stop (Patient not taking: Reported on 09/27/2015)   No facility-administered encounter medications on file as of 09/27/2015.     No Known Allergies    Immunization History  Administered Date(s) Administered  . Influenza Split 02/08/2011, 01/02/2012  . Influenza Whole 12/09/2007, 09/20/2009  . Influenza,inj,Quad PF,36+ Mos 12/21/2012, 03/23/2014, 04/21/2015  . Pneumococcal Polysaccharide-23 05/23/2009  . Tdap 03/04/2008    Current Medications, Allergies, Past Medical History, Past Surgical History, Family History, and Social History were reviewed in [employer] record.   Review of Systems         See HPI - all other systems neg except as noted... The patient complains of dyspnea on exertion.  The patient denies anorexia, fever, weight loss, weight gain, vision loss, decreased hearing, hoarseness, chest pain, syncope, peripheral edema, prolonged cough, headaches, hemoptysis, abdominal pain, melena, hematochezia, severe indigestion/heartburn, hematuria, incontinence, muscle weakness, suspicious skin lesions, transient blindness, difficulty walking, depression, unusual weight change, abnormal bleeding, enlarged lymph nodes, and angioedema.     Objective:   Physical Exam     WD, WN, 82 y/o BF in NAD... GENERAL:  Alert & oriented; pleasant & cooperative... HEENT:  [place]/AT, EOM-wnl, EACs-clear, TMs-wnl, NOSE-clear, THROAT-clear & wnl. NECK:   Supple w/ fairROM; no JVD; normal carotid impulses w/o bruits; no thyromegaly or nodules palpated; no lymphadenopathy. CHEST:  Coarse BS w/ no wheezing, no rales, no signs of consolidation. HEART:  Regular Rhythm; gr 1/6 SEM without rubs or gallops detected... ABDOMEN:  Soft & nontender; normal bowel sounds; no organomegaly or masses palpated... EXT: without deformities, mod arthritic changes; s/p right THR; no varicose veins/ +venous insuffic/  tr edema . BACK:  sl tender along right PSIS area... NEURO:  CN's intact; no focal neuro defict... DERM:  no lesions seen...  RADIOLOGY DATA:  Reviewed in the EPIC EMR & discussed w/ the patient...  LABORATORY DATA:  Reviewed in the EPIC EMR & discussed w/ the patient...     Assessment & Plan:    AB>  Improved overall & she stopped all prev meds on her own=> using the COMBIVENT respimat prn but hasn't needed...  HBP>  Good control on the Losartan & Amlodipine; asked to monitor BP at home...  ASHD>  Followed by [name] for Cards> cath 8/14 w/ LAD instent restenosis, s/p successful PCI & she is back to baseline; Note: ER 9/14 switched her Pepcid to Omep20Bid but her Plavix is critically important to  help patency of the LAD stent; therefore we will switch the Omep to PANTOPRAZOLE40Bid... ~  Stable on meds now without recent problems or need for med adjustments...  AFlutter>  DrHarwani has her on Amio 200mg  daily; her prev thyroid problems have resolved...  CHOL>  On Cres20 w/ good control of FLP...  THYROID DISEASE>  Prev Amio induced hyperthyroidism managed by DrKumar (on PTU prev) & resolved on lower dose of Amio...  GI>  Hx HH/ GERD/ Divertics/ Polyps>  She is followed by DrJacobs for GI as above; current symptoms sound like IBS=> BENTYL20- 1/2 to 1 tab Qid helped...   GU> mild renal insuffic is stable w/ Cr=1.7-1.6 on current meds...  DJD>  She is followed by DrDean for Ortho...  VitD defic>  She remains on VitD 50K weekly, reminded to  take it every wk...  Anemia>  Hg is OK but MCV is low, Fe was 73 (improved) & she will continue her Fe & VitC supplements...  Anxiety>  Requesting a mild nerve pill & we wrote for Alprazolam...   Patient's Medications  New Prescriptions   TRAMADOL (ULTRAM) 50 MG TABLET    Take 0.5-1 tablets (25-50 mg total) by mouth every 8 (eight) hours as needed for moderate pain or severe pain.  Previous Medications   ACETAMINOPHEN (TYLENOL) 325 MG TABLET    Take 325 mg by mouth every 6 (six) hours as needed. For pain   AMIODARONE (PACERONE) 200 MG TABLET    Take 200 mg by mouth daily.    AMLODIPINE (NORVASC) 5 MG TABLET    Take 5 mg by mouth daily.   ASPIRIN 81 MG TABLET    Take 81 mg by mouth daily.    DICYCLOMINE (BENTYL) 20 MG TABLET    Take 1/2-1 tablet by mouth up to 4 times a day as needed for abdominal cramping   IPRATROPIUM-ALBUTEROL (COMBIVENT RESPIMAT) 20-100 MCG/ACT AERS RESPIMAT    Inhale 1 puff into the lungs every 6 (six) hours.   NITROGLYCERIN (NITROSTAT) 0.4 MG SL TABLET    Place 0.4 mg under the tongue every 5 (five) minutes as needed. May repeat x3    PANTOPRAZOLE (PROTONIX) 40 MG TABLET    take 1 tablet by mouth twice a day   ROSUVASTATIN (CRESTOR) 20 MG TABLET    Take 20 mg by mouth at bedtime.   Modified Medications   Modified Medication Previous Medication   ALPRAZOLAM (XANAX) 0.25 MG TABLET ALPRAZolam (XANAX) 0.25 MG tablet      take 1 tablet by mouth three times a day if needed    take 1 tablet by mouth three times a day if needed   CLOPIDOGREL (PLAVIX) 75 MG TABLET clopidogrel (PLAVIX) 75 MG tablet      Take 1 tablet (75 mg total) by mouth daily with breakfast.    Take 1 tablet (75 mg total) by mouth daily with breakfast.   LOSARTAN (COZAAR) 100 MG TABLET losartan (COZAAR) 100 MG tablet      Take 1 tablet (100 mg total) by mouth daily.    take 1 tablet by mouth once daily   VITAMIN D, ERGOCALCIFEROL, (DRISDOL) 50000 UNITS CAPS CAPSULE Vitamin D, Ergocalciferol, (DRISDOL) 50000  units CAPS capsule      Take 1 capsule (50,000 Units total) by mouth every 7 (seven) days.    Take 1 capsule by mouth daily.  Discontinued Medications   FERROUS GLUCONATE (FERGON) 325 MG TABLET    Take 325 mg by mouth daily with breakfast.  With vitamin C tablet    FERROUS SULFATE 325 (65 FE) MG TABLET    Take 1 tablet (325 mg total) by mouth daily with breakfast.   LEVOFLOXACIN (LEVAQUIN) 500 MG TABLET    Take 1 tablet (500 mg total) by mouth daily.   PREDNISONE (STERAPRED UNI-PAK) 5 MG TABS TABLET    Take 1 tablet (5 mg total) by mouth daily.   VITAMIN C (ASCORBIC ACID) 500 MG TABLET    Take 500 mg by mouth daily. With iron tablet    VITAMIN D, ERGOCALCIFEROL, (DRISDOL) 50000 UNITS CAPS CAPSULE    Take 1 capsule (50,000 Units total) by mouth once a week.

## 2015-09-27 NOTE — Patient Instructions (Signed)
Today we updated your med list in our EPIC system...    Continue your current medications the same...  Keep up the good work w/ diet & exercise...  Call for any questions...  Let's plan a follow up visit in 27mo, sooner if needed for problems.Marland KitchenMarland Kitchen

## 2015-09-29 ENCOUNTER — Encounter (HOSPITAL_COMMUNITY): Payer: Self-pay

## 2015-10-02 ENCOUNTER — Encounter (HOSPITAL_COMMUNITY)
Admission: RE | Admit: 2015-10-02 | Discharge: 2015-10-02 | Disposition: A | Discharge status: Home / Self Care | Payer: Self-pay | Source: Ambulatory Visit | Attending: Cardiology | Admitting: Cardiology

## 2015-10-04 ENCOUNTER — Encounter (HOSPITAL_COMMUNITY)
Admission: RE | Admit: 2015-10-04 | Discharge: 2015-10-04 | Disposition: A | Discharge status: Home / Self Care | Payer: Self-pay | Source: Ambulatory Visit | Attending: Cardiology | Admitting: Cardiology

## 2015-10-04 DIAGNOSIS — I213 ST elevation (STEMI) myocardial infarction of unspecified site: Secondary | ICD-10-CM | POA: Insufficient documentation

## 2015-10-06 ENCOUNTER — Encounter (HOSPITAL_COMMUNITY): Payer: Self-pay

## 2015-10-09 ENCOUNTER — Encounter (HOSPITAL_COMMUNITY): Payer: Self-pay

## 2015-10-09 ENCOUNTER — Encounter (HOSPITAL_COMMUNITY)
Admission: RE | Admit: 2015-10-09 | Discharge: 2015-10-09 | Disposition: A | Discharge status: Home / Self Care | Payer: Self-pay | Source: Ambulatory Visit | Attending: Cardiology | Admitting: Cardiology

## 2015-10-11 ENCOUNTER — Encounter (HOSPITAL_COMMUNITY): Payer: Self-pay

## 2015-10-13 ENCOUNTER — Encounter (HOSPITAL_COMMUNITY): Payer: Self-pay

## 2015-10-16 ENCOUNTER — Encounter (HOSPITAL_COMMUNITY)
Admission: RE | Admit: 2015-10-16 | Discharge: 2015-10-16 | Disposition: A | Discharge status: Home / Self Care | Payer: Self-pay | Source: Ambulatory Visit | Attending: Cardiology | Admitting: Cardiology

## 2015-10-17 ENCOUNTER — Inpatient Hospital Stay (HOSPITAL_COMMUNITY)
Admission: RE | Admit: 2015-10-17 | Payer: Medicare Other | Source: Intra-hospital | Admitting: Physical Medicine & Rehabilitation

## 2015-10-18 ENCOUNTER — Encounter (HOSPITAL_COMMUNITY)
Admission: RE | Admit: 2015-10-18 | Discharge: 2015-10-18 | Disposition: A | Discharge status: Home / Self Care | Payer: Self-pay | Source: Ambulatory Visit | Attending: Cardiology | Admitting: Cardiology

## 2015-10-20 ENCOUNTER — Encounter (HOSPITAL_COMMUNITY): Payer: Self-pay

## 2015-10-23 ENCOUNTER — Encounter (HOSPITAL_COMMUNITY)
Admission: RE | Admit: 2015-10-23 | Discharge: 2015-10-23 | Disposition: A | Discharge status: Home / Self Care | Payer: Self-pay | Source: Ambulatory Visit | Attending: Cardiology | Admitting: Cardiology

## 2015-10-25 ENCOUNTER — Encounter (HOSPITAL_COMMUNITY): Payer: Self-pay

## 2015-10-27 ENCOUNTER — Encounter (HOSPITAL_COMMUNITY): Payer: Self-pay

## 2015-10-30 ENCOUNTER — Encounter (HOSPITAL_COMMUNITY): Payer: Self-pay

## 2015-11-01 ENCOUNTER — Encounter (HOSPITAL_COMMUNITY)
Admission: RE | Admit: 2015-11-01 | Discharge: 2015-11-01 | Disposition: A | Discharge status: Home / Self Care | Payer: Self-pay | Source: Ambulatory Visit | Attending: Cardiology | Admitting: Cardiology

## 2015-11-01 DIAGNOSIS — I2510 Atherosclerotic heart disease of native coronary artery without angina pectoris: Secondary | ICD-10-CM | POA: Diagnosis not present

## 2015-11-01 DIAGNOSIS — E785 Hyperlipidemia, unspecified: Secondary | ICD-10-CM | POA: Diagnosis not present

## 2015-11-01 DIAGNOSIS — I10 Essential (primary) hypertension: Secondary | ICD-10-CM | POA: Diagnosis not present

## 2015-11-03 ENCOUNTER — Encounter (HOSPITAL_COMMUNITY): Admission: RE | Admit: 2015-11-03 | Payer: Self-pay | Source: Ambulatory Visit

## 2015-11-03 DIAGNOSIS — I213 ST elevation (STEMI) myocardial infarction of unspecified site: Secondary | ICD-10-CM | POA: Insufficient documentation

## 2015-11-03 DIAGNOSIS — I2510 Atherosclerotic heart disease of native coronary artery without angina pectoris: Secondary | ICD-10-CM | POA: Diagnosis not present

## 2015-11-03 DIAGNOSIS — I480 Paroxysmal atrial fibrillation: Secondary | ICD-10-CM | POA: Diagnosis not present

## 2015-11-03 DIAGNOSIS — N189 Chronic kidney disease, unspecified: Secondary | ICD-10-CM | POA: Diagnosis not present

## 2015-11-03 DIAGNOSIS — I129 Hypertensive chronic kidney disease with stage 1 through stage 4 chronic kidney disease, or unspecified chronic kidney disease: Secondary | ICD-10-CM | POA: Diagnosis not present

## 2015-11-03 DIAGNOSIS — I252 Old myocardial infarction: Secondary | ICD-10-CM | POA: Diagnosis not present

## 2015-11-08 ENCOUNTER — Encounter (HOSPITAL_COMMUNITY)
Admission: RE | Admit: 2015-11-08 | Discharge: 2015-11-08 | Disposition: A | Discharge status: Home / Self Care | Payer: Self-pay | Source: Ambulatory Visit | Attending: Cardiology | Admitting: Cardiology

## 2015-11-09 DIAGNOSIS — I129 Hypertensive chronic kidney disease with stage 1 through stage 4 chronic kidney disease, or unspecified chronic kidney disease: Secondary | ICD-10-CM | POA: Diagnosis not present

## 2015-11-09 DIAGNOSIS — I252 Old myocardial infarction: Secondary | ICD-10-CM | POA: Diagnosis not present

## 2015-11-09 DIAGNOSIS — N189 Chronic kidney disease, unspecified: Secondary | ICD-10-CM | POA: Diagnosis not present

## 2015-11-09 DIAGNOSIS — I480 Paroxysmal atrial fibrillation: Secondary | ICD-10-CM | POA: Diagnosis not present

## 2015-11-09 DIAGNOSIS — I2510 Atherosclerotic heart disease of native coronary artery without angina pectoris: Secondary | ICD-10-CM | POA: Diagnosis not present

## 2015-11-10 ENCOUNTER — Encounter (HOSPITAL_COMMUNITY): Payer: Self-pay

## 2015-11-13 ENCOUNTER — Encounter (HOSPITAL_COMMUNITY): Payer: Self-pay

## 2015-11-15 ENCOUNTER — Encounter (HOSPITAL_COMMUNITY)
Admission: RE | Admit: 2015-11-15 | Discharge: 2015-11-15 | Disposition: A | Discharge status: Home / Self Care | Payer: Self-pay | Source: Ambulatory Visit | Attending: Cardiology | Admitting: Cardiology

## 2015-11-17 ENCOUNTER — Encounter (HOSPITAL_COMMUNITY): Payer: Self-pay

## 2015-11-20 ENCOUNTER — Encounter (HOSPITAL_COMMUNITY)
Admission: RE | Admit: 2015-11-20 | Discharge: 2015-11-20 | Disposition: A | Discharge status: Home / Self Care | Payer: Self-pay | Source: Ambulatory Visit | Attending: Cardiology | Admitting: Cardiology

## 2015-11-22 ENCOUNTER — Encounter (HOSPITAL_COMMUNITY)
Admission: RE | Admit: 2015-11-22 | Discharge: 2015-11-22 | Disposition: A | Discharge status: Home / Self Care | Payer: Self-pay | Source: Ambulatory Visit | Attending: Cardiology | Admitting: Cardiology

## 2015-11-24 ENCOUNTER — Encounter (HOSPITAL_COMMUNITY): Payer: Self-pay

## 2015-11-27 ENCOUNTER — Encounter (HOSPITAL_COMMUNITY)
Admission: RE | Admit: 2015-11-27 | Discharge: 2015-11-27 | Disposition: A | Discharge status: Home / Self Care | Payer: Self-pay | Source: Ambulatory Visit | Attending: Cardiology | Admitting: Cardiology

## 2015-11-29 ENCOUNTER — Encounter (HOSPITAL_COMMUNITY)
Admission: RE | Admit: 2015-11-29 | Discharge: 2015-11-29 | Disposition: A | Discharge status: Home / Self Care | Payer: Self-pay | Source: Ambulatory Visit | Attending: Cardiology | Admitting: Cardiology

## 2015-12-01 ENCOUNTER — Encounter (HOSPITAL_COMMUNITY): Payer: Self-pay

## 2015-12-04 ENCOUNTER — Encounter (HOSPITAL_COMMUNITY): Payer: Self-pay

## 2015-12-04 DIAGNOSIS — I213 ST elevation (STEMI) myocardial infarction of unspecified site: Secondary | ICD-10-CM | POA: Insufficient documentation

## 2015-12-06 ENCOUNTER — Encounter (HOSPITAL_COMMUNITY): Payer: Self-pay

## 2015-12-08 ENCOUNTER — Encounter (HOSPITAL_COMMUNITY): Payer: Self-pay

## 2015-12-11 ENCOUNTER — Encounter (HOSPITAL_COMMUNITY)
Admission: RE | Admit: 2015-12-11 | Discharge: 2015-12-11 | Disposition: A | Discharge status: Home / Self Care | Payer: Self-pay | Source: Ambulatory Visit | Attending: Cardiology | Admitting: Cardiology

## 2015-12-13 ENCOUNTER — Encounter (HOSPITAL_COMMUNITY)
Admission: RE | Admit: 2015-12-13 | Discharge: 2015-12-13 | Disposition: A | Discharge status: Home / Self Care | Payer: Self-pay | Source: Ambulatory Visit | Attending: Cardiology | Admitting: Cardiology

## 2015-12-15 ENCOUNTER — Encounter (HOSPITAL_COMMUNITY): Payer: Self-pay

## 2015-12-18 ENCOUNTER — Encounter (HOSPITAL_COMMUNITY)
Admission: RE | Admit: 2015-12-18 | Discharge: 2015-12-18 | Disposition: A | Discharge status: Home / Self Care | Payer: Self-pay | Source: Ambulatory Visit | Attending: Cardiology | Admitting: Cardiology

## 2015-12-20 ENCOUNTER — Encounter (HOSPITAL_COMMUNITY): Payer: Self-pay

## 2015-12-22 ENCOUNTER — Encounter (HOSPITAL_COMMUNITY): Payer: Self-pay

## 2015-12-25 ENCOUNTER — Encounter (HOSPITAL_COMMUNITY): Payer: Self-pay

## 2015-12-27 ENCOUNTER — Encounter (HOSPITAL_COMMUNITY)
Admission: RE | Admit: 2015-12-27 | Discharge: 2015-12-27 | Disposition: A | Discharge status: Home / Self Care | Payer: Self-pay | Source: Ambulatory Visit | Attending: Cardiology | Admitting: Cardiology

## 2015-12-29 ENCOUNTER — Encounter (HOSPITAL_COMMUNITY)
Admission: RE | Admit: 2015-12-29 | Discharge: 2015-12-29 | Disposition: A | Discharge status: Home / Self Care | Payer: Self-pay | Source: Ambulatory Visit | Attending: Cardiology | Admitting: Cardiology

## 2016-01-01 ENCOUNTER — Encounter (HOSPITAL_COMMUNITY): Payer: Self-pay

## 2016-01-03 ENCOUNTER — Encounter (HOSPITAL_COMMUNITY): Payer: Self-pay

## 2016-01-03 DIAGNOSIS — I213 ST elevation (STEMI) myocardial infarction of unspecified site: Secondary | ICD-10-CM | POA: Insufficient documentation

## 2016-01-08 ENCOUNTER — Encounter (HOSPITAL_COMMUNITY)
Admission: RE | Admit: 2016-01-08 | Discharge: 2016-01-08 | Disposition: A | Discharge status: Home / Self Care | Payer: Self-pay | Source: Ambulatory Visit | Attending: Cardiology | Admitting: Cardiology

## 2016-01-10 ENCOUNTER — Encounter (HOSPITAL_COMMUNITY)
Admission: RE | Admit: 2016-01-10 | Discharge: 2016-01-10 | Disposition: A | Discharge status: Home / Self Care | Payer: Self-pay | Source: Ambulatory Visit | Attending: Cardiology | Admitting: Cardiology

## 2016-01-15 ENCOUNTER — Encounter (HOSPITAL_COMMUNITY)
Admission: RE | Admit: 2016-01-15 | Discharge: 2016-01-15 | Disposition: A | Discharge status: Home / Self Care | Payer: Self-pay | Source: Ambulatory Visit | Attending: Cardiology | Admitting: Cardiology

## 2016-01-16 ENCOUNTER — Ambulatory Visit (INDEPENDENT_AMBULATORY_CARE_PROVIDER_SITE_OTHER): Payer: Medicare Other

## 2016-01-16 DIAGNOSIS — Z23 Encounter for immunization: Secondary | ICD-10-CM | POA: Diagnosis not present

## 2016-02-02 ENCOUNTER — Encounter (HOSPITAL_COMMUNITY): Payer: Self-pay

## 2016-02-05 ENCOUNTER — Encounter (HOSPITAL_COMMUNITY)
Admission: RE | Admit: 2016-02-05 | Discharge: 2016-02-05 | Disposition: A | Discharge status: Home / Self Care | Payer: Self-pay | Source: Ambulatory Visit | Attending: Cardiology | Admitting: Cardiology

## 2016-02-09 DIAGNOSIS — I480 Paroxysmal atrial fibrillation: Secondary | ICD-10-CM | POA: Diagnosis not present

## 2016-02-09 DIAGNOSIS — I252 Old myocardial infarction: Secondary | ICD-10-CM | POA: Diagnosis not present

## 2016-02-09 DIAGNOSIS — N189 Chronic kidney disease, unspecified: Secondary | ICD-10-CM | POA: Diagnosis not present

## 2016-02-09 DIAGNOSIS — I129 Hypertensive chronic kidney disease with stage 1 through stage 4 chronic kidney disease, or unspecified chronic kidney disease: Secondary | ICD-10-CM | POA: Diagnosis not present

## 2016-02-09 DIAGNOSIS — I2510 Atherosclerotic heart disease of native coronary artery without angina pectoris: Secondary | ICD-10-CM | POA: Diagnosis not present

## 2016-02-12 ENCOUNTER — Encounter (HOSPITAL_COMMUNITY)
Admission: RE | Admit: 2016-02-12 | Discharge: 2016-02-12 | Disposition: A | Discharge status: Home / Self Care | Payer: Self-pay | Source: Ambulatory Visit | Attending: Cardiology | Admitting: Cardiology

## 2016-02-14 ENCOUNTER — Encounter (HOSPITAL_COMMUNITY)
Admission: RE | Admit: 2016-02-14 | Discharge: 2016-02-14 | Disposition: A | Discharge status: Home / Self Care | Payer: Self-pay | Source: Ambulatory Visit | Attending: Cardiology | Admitting: Cardiology

## 2016-02-16 ENCOUNTER — Encounter (HOSPITAL_COMMUNITY): Payer: Self-pay

## 2016-02-19 ENCOUNTER — Encounter (HOSPITAL_COMMUNITY)
Admission: RE | Admit: 2016-02-19 | Discharge: 2016-02-19 | Disposition: A | Discharge status: Home / Self Care | Payer: Self-pay | Source: Ambulatory Visit | Attending: Cardiology | Admitting: Cardiology

## 2016-02-21 ENCOUNTER — Encounter (HOSPITAL_COMMUNITY): Payer: Self-pay

## 2016-02-23 ENCOUNTER — Encounter (HOSPITAL_COMMUNITY): Payer: Self-pay

## 2016-02-28 ENCOUNTER — Encounter (HOSPITAL_COMMUNITY): Payer: Self-pay

## 2016-03-01 ENCOUNTER — Encounter (HOSPITAL_COMMUNITY): Payer: Self-pay

## 2016-03-06 ENCOUNTER — Encounter (HOSPITAL_COMMUNITY): Payer: Self-pay

## 2016-03-06 DIAGNOSIS — I2510 Atherosclerotic heart disease of native coronary artery without angina pectoris: Secondary | ICD-10-CM | POA: Insufficient documentation

## 2016-03-08 ENCOUNTER — Encounter (HOSPITAL_COMMUNITY): Payer: Self-pay

## 2016-03-11 ENCOUNTER — Encounter (HOSPITAL_COMMUNITY): Payer: Self-pay

## 2016-03-12 ENCOUNTER — Other Ambulatory Visit: Payer: Self-pay | Admitting: Pulmonary Disease

## 2016-03-12 MED ORDER — ALPRAZOLAM 0.25 MG PO TABS: ORAL_TABLET | ORAL | 1 refills | Status: DC

## 2016-03-12 NOTE — Telephone Encounter (Signed)
Spoke with Juliann Pulse with [facility] and phoned in rx. Nothing further needed.

## 2016-03-12 NOTE — Telephone Encounter (Signed)
Pt is requesting refills for xanax 0.25mg . Rx last refilled on 06-26-15 with a sig take 1 tab tid prn, 90 tabs with 1 refill. Pt last OV 09-27-15, with a pending apt for 04-02-15. SN please advise on refill. Thanks.

## 2016-03-13 ENCOUNTER — Encounter (HOSPITAL_COMMUNITY)
Admission: RE | Admit: 2016-03-13 | Discharge: 2016-03-13 | Disposition: A | Discharge status: Home / Self Care | Payer: Self-pay | Source: Ambulatory Visit | Attending: Cardiology | Admitting: Cardiology

## 2016-03-15 ENCOUNTER — Encounter (HOSPITAL_COMMUNITY)
Admission: RE | Admit: 2016-03-15 | Discharge: 2016-03-15 | Disposition: A | Discharge status: Home / Self Care | Payer: Self-pay | Source: Ambulatory Visit | Attending: Cardiology | Admitting: Cardiology

## 2016-03-18 ENCOUNTER — Encounter (HOSPITAL_COMMUNITY): Payer: Self-pay

## 2016-03-20 ENCOUNTER — Encounter (HOSPITAL_COMMUNITY): Payer: Self-pay

## 2016-03-22 ENCOUNTER — Telehealth: Payer: Self-pay | Admitting: Pulmonary Disease

## 2016-03-22 ENCOUNTER — Encounter (HOSPITAL_COMMUNITY): Payer: Self-pay

## 2016-03-22 MED ORDER — AZITHROMYCIN 250 MG PO TABS: ORAL_TABLET | ORAL | 0 refills | Status: AC

## 2016-03-22 NOTE — Telephone Encounter (Signed)
Spoke with pt, who states she has body aches, temp of 101 this morning & sweats X 3d. Pt denies any cough, wheezing or chest discomfort. Pt is requesting an abx to be sent in.  Pt taking tylenol bid with no improvement.  SN please advise. Thanks.

## 2016-03-22 NOTE — Telephone Encounter (Signed)
Per SN-she should really call her PCP for this but for this time okay to give Zpak #1 take as directed no refills.

## 2016-03-22 NOTE — Telephone Encounter (Signed)
Spoke with pt. SN is her PCP. Rx has been sent in. Nothing further was needed.

## 2016-03-25 ENCOUNTER — Encounter (HOSPITAL_COMMUNITY)
Admission: RE | Admit: 2016-03-25 | Discharge: 2016-03-25 | Disposition: A | Discharge status: Home / Self Care | Payer: Self-pay | Source: Ambulatory Visit | Attending: Cardiology | Admitting: Cardiology

## 2016-03-27 ENCOUNTER — Encounter (HOSPITAL_COMMUNITY)
Admission: RE | Admit: 2016-03-27 | Discharge: 2016-03-27 | Disposition: A | Discharge status: Home / Self Care | Payer: Self-pay | Source: Ambulatory Visit | Attending: Cardiology | Admitting: Cardiology

## 2016-03-29 ENCOUNTER — Encounter (HOSPITAL_COMMUNITY): Payer: Self-pay

## 2016-04-01 ENCOUNTER — Encounter: Payer: Self-pay | Admitting: Pulmonary Disease

## 2016-04-01 ENCOUNTER — Other Ambulatory Visit (INDEPENDENT_AMBULATORY_CARE_PROVIDER_SITE_OTHER): Payer: Medicare Other

## 2016-04-01 ENCOUNTER — Encounter (HOSPITAL_COMMUNITY)
Admission: RE | Admit: 2016-04-01 | Discharge: 2016-04-01 | Disposition: A | Discharge status: Home / Self Care | Payer: Self-pay | Source: Ambulatory Visit | Attending: Cardiology | Admitting: Cardiology

## 2016-04-01 ENCOUNTER — Ambulatory Visit (INDEPENDENT_AMBULATORY_CARE_PROVIDER_SITE_OTHER): Payer: Medicare Other | Admitting: Pulmonary Disease

## 2016-04-01 VITALS — BP 136/74 | HR 73 | Ht 62.0 in | Wt 154.6 lb

## 2016-04-01 DIAGNOSIS — K449 Diaphragmatic hernia without obstruction or gangrene: Secondary | ICD-10-CM

## 2016-04-01 DIAGNOSIS — G8929 Other chronic pain: Secondary | ICD-10-CM | POA: Diagnosis not present

## 2016-04-01 DIAGNOSIS — E559 Vitamin D deficiency, unspecified: Secondary | ICD-10-CM

## 2016-04-01 DIAGNOSIS — I4892 Unspecified atrial flutter: Secondary | ICD-10-CM

## 2016-04-01 DIAGNOSIS — D649 Anemia, unspecified: Secondary | ICD-10-CM

## 2016-04-01 DIAGNOSIS — I2510 Atherosclerotic heart disease of native coronary artery without angina pectoris: Secondary | ICD-10-CM

## 2016-04-01 DIAGNOSIS — F411 Generalized anxiety disorder: Secondary | ICD-10-CM

## 2016-04-01 DIAGNOSIS — M159 Polyosteoarthritis, unspecified: Secondary | ICD-10-CM

## 2016-04-01 DIAGNOSIS — I10 Essential (primary) hypertension: Secondary | ICD-10-CM | POA: Diagnosis not present

## 2016-04-01 DIAGNOSIS — M545 Low back pain: Secondary | ICD-10-CM

## 2016-04-01 DIAGNOSIS — M150 Primary generalized (osteo)arthritis: Secondary | ICD-10-CM

## 2016-04-01 DIAGNOSIS — D126 Benign neoplasm of colon, unspecified: Secondary | ICD-10-CM

## 2016-04-01 DIAGNOSIS — K5730 Diverticulosis of large intestine without perforation or abscess without bleeding: Secondary | ICD-10-CM

## 2016-04-01 LAB — COMPREHENSIVE METABOLIC PANEL
ALBUMIN: 4.1 g/dL (ref 3.5–5.2)
ALK PHOS: 87 U/L (ref 39–117)
ALT: 15 U/L (ref 0–35)
AST: 19 U/L (ref 0–37)
BILIRUBIN TOTAL: 0.8 mg/dL (ref 0.2–1.2)
BUN: 28 mg/dL — ABNORMAL HIGH (ref 6–23)
CALCIUM: 9.8 mg/dL (ref 8.4–10.5)
CO2: 28 mEq/L (ref 19–32)
CREATININE: 1.48 mg/dL — AB (ref 0.40–1.20)
Chloride: 105 mEq/L (ref 96–112)
GFR: 42.7 mL/min — AB (ref >60.00)
Glucose, Bld: 79 mg/dL (ref 70–99)
Potassium: 4.6 mEq/L (ref 3.5–5.1)
Sodium: 139 mEq/L (ref 135–145)
TOTAL PROTEIN: 7.7 g/dL (ref 6.0–8.3)

## 2016-04-01 LAB — CBC WITH DIFFERENTIAL/PLATELET
BASOS ABS: 0 10*3/uL (ref 0.0–0.1)
BASOS PCT: 0.7 % (ref 0.0–3.0)
EOS ABS: 0.1 10*3/uL (ref 0.0–0.7)
Eosinophils Relative: 0.8 % (ref 0.0–5.0)
HCT: 41.6 % (ref 36.0–46.0)
HEMOGLOBIN: 13.5 g/dL (ref 12.0–15.0)
Lymphocytes Relative: 31.2 % (ref 12.0–46.0)
Lymphs Abs: 2.1 10*3/uL (ref 0.7–4.0)
MCHC: 32.4 g/dL (ref 30.0–36.0)
MCV: 77.5 fl — ABNORMAL LOW (ref 78.0–100.0)
MONO ABS: 0.6 10*3/uL (ref 0.1–1.0)
Monocytes Relative: 8.2 % (ref 3.0–12.0)
Neutro Abs: 4.1 10*3/uL (ref 1.4–7.7)
Neutrophils Relative %: 59.1 % (ref 43.0–77.0)
Platelets: 116 10*3/uL — ABNORMAL LOW (ref 150.0–400.0)
RBC: 5.37 Mil/uL — AB (ref 3.87–5.11)
RDW: 16 % — AB (ref 11.5–15.5)
WBC: 6.9 10*3/uL (ref 4.0–10.5)

## 2016-04-01 LAB — TSH: TSH: 3.44 u[IU]/mL (ref 0.35–4.50)

## 2016-04-01 NOTE — Patient Instructions (Signed)
Today we updated your med list in our EPIC system...    Continue your current medications the same...  Today we checked your non-fasting blood work...    We will contact you w/ the results when available...   Keep up the good work w/ your diet & physical therapy in the pulm rehab group...  Call for any questions...  Let's plan a follow up visit in 60mo, sooner if needed for problems.Marland KitchenMarland Kitchen

## 2016-04-01 NOTE — Progress Notes (Signed)
HPI  Review of Systems  Physical Exam  Subjective:    Patient ID: Virginia Casey, female    DOB: 1924-07-31, 83 y.o.   MRN: [id number]  HPI 83 y/o BF here for a follow up visit... she has mult med problems as noted below...  ~  SEE PREV EPIC NOTES FOR OLDER DATA >>    CXR 7/13 showed normal heart size, largeHH, bibasilar atx, otherw clear...  EKG 7/13 showed SBrady, rate52, no acute changes...  Myoview 8/13 at [facility] showed no ischemia or infarction, normal wall motion, EF=70%...  LABS 7/13:  Chems- wnl;  CBC- wnl...   LABS 3/14:  FLP- at goals on Cres20;  Chems- wnl;  CBC- wnl;  TSH=2.11;  VitD=12 & Rec to start 50,000u weekly supplement...   CXR 7/14 showed cardiomeg, largeHH, NAD...   PFT's were attempted but pt absolutely could not do the simple spirometry...  LABS 7/14 showed Chems- wnl, CBC- ok x plat=113K  ~  March 23, 2013:  53mo ROV & [place] reports doing well, no new complaints or concerns...    Chol> on Cres20; FLP 1/15 showed TChol 133, TG 38, HDL 75, LDL 50- we don't have notes/ labs from [facility]....    Renal insuffic> on Amlod5, Losar100, Amio200; Labs 1/15 showed BUN=42, Cr=1.8.Marland KitchenMarland Kitchen She knows to avoid NSAIDs & incr fluids...    Anemia> Hg=12.3, MCV=78; needs to restart Fe daily & stay on this... [name] prob list, meds, xrays and labs> see below for updates >>   LABS 1/15:  FLP- at goals on Cres20;  Chems- ok x renal insuffic w/ BUN=42, Cr=1.8;  CBC- ok w/ Hg=12.3, MCV=78;  TSH=3.09;  VitD=50... REC to continue Cres20, avoid NSAIDs & incr fluids, start FeSO4 daily and continue VitD50K weekly...   ~  September 20, 2013:  78mo ROV & [name] noted diarrhea x3d last week for which she took some Pepto & now much improved... She reports doing well overall at age 61 w/o other complaints or concerns...  She's been doing Cardiac rehab & really likes it...    She continues regular f/u w/ Cards- DrHarwani on Amlod5, Losar100, Amio200, ASA.Plavix; BP= 130/80 & she denies CP, palpit,  SOB, edema, or cerebral ischemic symptoms...    She remains on Cres20 & FLP 1/15 looked great...     She is clinically & biochem euthyroid w/ TSH 1/15 = 3.09...    She has mild renal insuffic w/ Labs 7/15 showing BUN= 32, Cr= 1.7 which is stable since last Sep    Hx Anemia on Fe daily> Labs 7/15 showed Hg=13.2 (up 1pt), MCV=78, Fe=73 (15%sat) and rec to continue same meds & supplements... [name] prob list, meds, xrays and labs> see below for updates >>  REC to continue same meds and supplements including the Fe...  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     AB> on Combivent prn (hasn't needed in a long time); doing well, no resp infections or exacerbations...    HBP> on Losartan100 & Amlod5; BP= 132/86 & she denies CP, palpit, SOB, edema; rec to continue same meds + diet...    CAD> followed by [name] on ASA81, Plavix75; eval 7/14 w/ abn Myoview & subseq cath 8/14 showed in-stent restenosis in LAD, s/p PCI & back to baseline...    PAFlutter>  still on Amio200 per [facility]; she denies palpit, dizzy, ch in SOB, etc...    Cerebrovasc dis> on ASA81/ Plavix75; she denies cerebral ischemic symptoms...    Chol> on Cres20; FLP 1/16 shows TChol 154, TG 62, HDL 68, LDL 74; continue diet & Cres20...    Thyroid dis> followed by [name]; she has hx hyperthyroidism secondary to Amio & a prob multinod goiter; treated w/ Tapazole, then PTU; now off PTU & "everything is fine"; Labs 1/16 showed TSH= 2.31...    GI> HH, Divertics, Polyps> on Protonix40Bid to avoid Plavix interaction; doing satis w/o abd pain, n/v, c/d, blood seen...    Renal insuffic> on Amlod5, Losar100, Amio200; Labs 1/16 showed BUN=27, Cr=1.52 (improved)... She knows to avoid NSAIDs & incr fluids...    DJD, VitD defic> on Tylenol & VitD 50K per week since labs 3/14 showed VitD level  lower at 12; f/u VitD level 1/16= 28, therefore continue 50K weekly...    Anxiety> on Alpraz0.25 prn which really helps...    Anemia> on Fe w/ VitC; Labs 1/16 showed Hg=13.2, MCV=77, Fe= needs to be repeated; rec to continue supplements daily... [name] prob list, meds, xrays and labs> see below for updates >> Given 2015 Flu vaccine today, OK refills as requested.[name]  LABS 1/16:  FLP- within parameters on Cres20;  Chems- ok x Cr=1.52;  CBC- ok x MCV=77 & Plat=112;  TSH=2.31;  VitD=28... PLAN>> Continue same meds, refills given per request; rec to continue MVI, FeSO4 daily, & VitD 50K weekly...  ~  Jul 27, 2014:  67mo ROV & add-on appt requested for "stomach issues">  [name] is c/o "my divertics are acting up" over the last week;  Notes periumbil pain off & on, ?ppt events, lasts up to 1h, relief spont; she denies n/v, d/c/bl seen, no f/c/s; her ROS is otherw neg w/o CP, palpit, SOB, cough/ phlegm, etc...       GI> HH, Divertics, Polyps> on Protonix40Bid to avoid Plavix interaction; she has a giantHH measuring 10cm w/ erosions on last EGD 4/08 by DrSam; she was Hosp 11/10 w/ lower GIB believed due to divertics- colon by DrJacobs w/ mult divertics throughout & one adenomatous polyp removed from the sigmoid colon...       EXAM shows Afeb, VSS, O2sat=99% on RA;  HEENT- neg;  Chest- clear w/o w/r/r;  Cor- RR gr1/6SEM w/o r/g;  Abd- soft, nontender, w/o masses; Ext- neg w/o c/c/e... IMP/PLAN>>  Sounds like IBS & we discussed treating her cramping pain w/ BENTYL 20mg - 1/2 to 1 tab qid as needed; if symptoms persist we will request GI consultation...   ~  September 27, 2014:  82mo ROV & [name] reports much improved "can't complain she says... She continues to f/u w/ Cards- DrHarwani every 52mo; her GI complaints have resolved w/ the Bentyl & now back to baseline... She notes some arthritis pain but Tylenol helps... She is in good spirits!    EXAM shows Afeb, VSS, O2sat=96% on RA;  HEENT- neg;  Chest- clear w/o w/r/r;   Cor- RR gr1/6SEM w/o r/g;  Abd- soft, nontender, w/o masses; Ext- neg w/o c/c/e... [name] prob list, meds, xrays and labs>   IMP/PLAN>>  Stable on current med Rx; as noted she sees Cards Q77mo so we will plan ROV in 63mo, sooner if needed prn...  ~  March 30, 2015:  50mo ROV & Laquasha has mult Orthopedic complaints- left scapula/ shoulder/ arm pain for several days, no known trauma, "arthritis Tylenol" is helping some &  we discussed Rx w/ Tramadol & refer to Ortho when she is ready;  No other complaints or concerns;  She continues to see DrHarwani Q60mo, we do not have notes from him to review;  [name] the following medical problems during today's office visit >>     AB> on Combivent prn (hasn't needed in a long time); doing well, no resp infections or exacerbations...    HBP> on Losartan100 & Amlod5; BP= 136/78 & she denies CP, palpit, SOB, edema; rec to continue same meds + diet...    CAD> followed by [name] on ASA81, Plavix75; eval 7/14 w/ abn Myoview & subseq cath 8/14 showed in-stent restenosis in LAD, s/p PCI & back to baseline, we do not have recent notes...    PAFlutter> still on Amio200 per [facility]; she denies palpit, dizzy, ch in SOB, etc...    Cerebrovasc dis> on ASA81/ Plavix75; she denies cerebral ischemic symptoms...    Chol> on Cres20; FLP 1/17 shows TChol 145, TG 62, HDL 70, LDL 63; continue diet & Cres20...    Thyroid dis> followed by [name]; she has hx hyperthyroidism secondary to Amio & a prob multinod goiter; treated w/ Tapazole, then PTU; now off PTU & "everything is fine"; Labs 1/17 showed TSH= 4.00...    GI> HH, Divertics, Polyps> on Protonix40Bid to avoid Plavix interaction; doing satis w/o abd pain, n/v, c/d, blood seen...    Renal insuffic> on Amlod5, Losar100, Amio200; Labs 1/17 showed BUN=24, Cr=1.44 (improved)... She knows to avoid NSAIDs & incr fluids...    DJD, VitD defic> on Tylenol & VitD 50K per week since labs 3/14 showed VitD level lower at 12; f/u VitD  level 1/17= 42, therefore continue 50K weekly...    Anxiety> on Alpraz0.25 prn which really helps...    Anemia> off Fe w/ VitC now; Labs 1/17 showed Hg=13.6, MCV=78, Fe= needs to be repeated; rec to continue supplements daily... EXAM shows Afeb, VSS, O2sat=98% on RA;  HEENT- neg;  Chest- clear w/o w/r/r;  Cor- RR gr1/6SEM w/o r/g;  Abd- soft, nontender, w/o masses; Ext- neg w/o c/c/e...  CXR 03/31/15>  Cardiomegaly, low lung volumes w/ basialr atx, large HH noted, osteopenia and DJD in Tspine...  LABS 03/2015>  FLP- at goals on Cres20;  Chems- wnl x Cr=1.44;  CBC- ok w/ Hg=13.6 but MCV=78;  TSH=4.00;  VitD=42 IMP/PLAN>>  Rhayne is stable on current regimen; we will add Tramadol50 prn & encourage Ortho f/u when she is ready; continue Cards f/u DrHarwani & I've asked her to get notes sent to [place]...  ~  September 27, 2015:  82mo ROV & medical follow up-- Latosha relates several problems during the 55mo interval (outlined below but feeling better now)> 1) she saw TP 07/21/15 for an acute visit w/ sinus & chest congestion, cough, incr SOB & wheezing- given ZPak, Pred, Mucinex, Hydromet & improved... 2) she fell 05/2015 (bathmat slipped) & went to the ER> evalo showed CT Head- ok but CT Cspine w/ small (10%) compression fx T1 & T2, no neuro symptoms and pain resolved w/ Tramadol; XRays of Lspine, knees, hips- all OK. 3) she continues to f/u w/ DrHarwani regularly Q41mo & he does blood work  but we do not have notes from him; she reports "everything was good & he was happy"...    Breathing is good; she has Combivent for prn use...    BP remains stable on Amlod5 & Losar100; BP=110/76 today & she denies CP, palpit, dizzy/syncope, edema...    She has  CAD, hx AFlutter, known cerebrovasc dis followed by [facility] on Amio200, ASA/Plavix... EXAM shows Afeb, VSS, O2sat=97% on RA;  HEENT- neg, mallampati2;  Chest- clear now w/o w/r/r;  Cor- RR gr1/6SEM w/o r/g;  Abd- soft, nontender, w/o masses; Ext- neg w/o c/c/e... IMP/PLAN>>   Karinna is stable at 83 y/o w/ mult medical issues as listed; we reviewed her meds and discussed need for medication compliance; no changes made today & she will f/u in 8mo w/ fasting blood work.   ~  April 01, 2016:  57mo ROV & medical follow up visit... Domnique reports that she continues to do well- no new complaints or concerns;  She continues to f/u w/ DrHarwani but we do not have any of his records & pt is reminded to request OV notes to be sent to me...     AB> on Combivent prn (hasn't needed in a long time); doing well, no resp infections or exacerbations...    HBP> on Losartan100 & Amlod5; BP= 136/74 & she denies CP, palpit, SOB, edema; rec to continue same meds + diet...    CAD> followed by [name] on ASA81, Plavix75; eval 7/14 w/ abn Myoview & subseq cath 8/14 showed in-stent restenosis in LAD, s/p PCI & back to baseline, we do not have recent notes...    PAFlutter> still on Amio200 per [facility]; she denies palpit, dizzy, ch in SOB, etc...    Cerebrovasc dis> on ASA81/ Plavix75; she denies cerebral ischemic symptoms...    Chol> on Cres20; FLP 1/17 shows TChol 145, TG 62, HDL 70, LDL 63; continue diet & Cres20, she says DrHarwani is following FLP...    Thyroid dis> followed by [name]; she has hx hyperthyroidism secondary to Amio & a prob multinod goiter; treated w/ Tapazole, then PTU; now off PTU & "everything is fine"; Labs 1/18 shows TSH= 3.44...    GI> HH, Divertics, Polyps> on Protonix40Qd (to avoid Plavix interaction); doing satis w/o abd pain, n/v, c/d, blood seen...    Renal insuffic> on Amlod5, Losar100, Amio200; Labs 1/17 showed BUN=24, Cr=1.44 (improved);  Labs 1/18 shows Cr=1.48... She knows to avoid NSAIDs & incr fluids...    DJD, VitD defic> on Tylenol & VitD 50K per week since labs 3/14 showed VitD level lower at 12; f/u VitD level 1/17= 42, f/u 1/18 shows VitD=49; therefore continue 50K weekly...    Anxiety> on Alpraz0.25 prn which really helps...    Anemia> off Fe w/ VitC now;  Labs 1/18 showed Hg=13.6, MCV=78, Fe= needs to be repeated; rec to continue supplements daily... EXAM shows Afeb, VSS, O2sat=98% on RA;  HEENT- neg, mallampati2;  Chest- clear now w/o w/r/r;  Cor- RR gr1/6SEM w/o r/g;  Abd- soft, nontender, w/o masses; Ext- neg w/o c/c/e;  Neuro- intact w/o focal findings...  LABS 04/01/16>  Chems- ok w/ Cr=1.48;  CBC- wnl w/ Hg=13.5, Plat=116K;  TSH=3.44;  VitD=49... IMP/PLAN>>  Shelise remains stable at 83 y/o w/o new complaints or concerns; she is followed regularly for CARDS by [facility] but we do not have any of his notes to review- I explained this to the pt & requested her to authorize him to send [place] notes to be scanned into Epic... we discussed ROV in 66mo or sooner prn new problems.          Problem List:  Hearing Loss >> she saw DrCrossley 3/13 w/ a high freq hearing loss & he removed creumen impactions' they are holding off on hearing aides for now...  ASTHMATIC BRONCHITIS (ICD-466.0) -  breathing is stable now- s/p recent exacerbation... ~  CXR 1/12 showed basilar atx & large HH, cardiomeg, osteopenia, NAD... ~  [facility] 5/12 w/ refractory AB exac & improved w/ standard therapy & brief course of Pred; CXR- similar, NAD.[name]. ~  CXR 7/13 showed norm heart size, large HH, clear lungs, NAD.[name]. ~  7/14:  She presented w/ a mild exac brought on by the stress of a trip to [place] etc=> Rx w/ COMBIVENT every 6h as needed... ~  CXR 7/14 showed cardiomeg, largeHH, NAD... We attempted PFTs but she was unable to perform... Os sats were 98% on RA... ~  She remains stable, denies breathing problems and hasn't needed the Combivent...  HYPERTENSION (ICD-401.9) - now on LOSARTAN 100mg /d & AMLODIPINE 5mg /d (and off her prev Ramipril due to asthma & Imdur due to HA) ...  ~  8/12:  BP= 110/72 & not checking BP's at home; she denies visual changes, CP, palipit, syncope, dyspnea, edema, etc... ~  2/13:  BP= 122/82 & she is stable, mostly asymptomatic w/o CP, palpit, SOB,  etc... ~  8/13:  BP= 124/86 & she remains stable... ~  3/14: on Losartan100 & off Amlod5; BP= 130/84 & she denies CP, palpit, SOB, edema; rec to continue same meds + diet. ~  7/14: on Losartan100 & off Amlod5; BP= 132/92 & she denies CP, palpit, or edema... ~  10/14: now on Losartan100 + Amlodipine5; BP= 118/88 & she remains asymptomatic... ~  7/15: on Amlod5, Losar100, Amio200, ASA.Plavix; BP= 130/80 & she denies CP, palpit, SOB, edema, or cerebral ischemic symptoms. ~  1/16: on Losartan100 & Amlod5; BP= 132/86 & she denies CP, palpit, SOB, edema; rec to continue same meds + diet. ~  5/16: on Losartan100 & Amlod5; BP= 110/70 & she remains asymptomatic.  ATHEROSCLEROTIC HEART DISEASE (ICD-414.00) & PERIPHERAL VASCULAR DISEASE (ICD-443.9) - regularly on ASA 81mg /d, & off Plavix... followed by [facility] for Cards (we do not have his notes to review med changes)>> ~  hosp in Jan08 w/ syncope and MRI Brain showed mild atrophy & sm vessel dis;  MRA showed basilar art narrowing & mod stenosis in RICA cavernous segment... CDopplers however were WNL w/ antegrade vertebral flow, and no signif ICA stenoses detected... ~  hosp Feb10 by [facility] w/ non-STEMI- s/p PTCA and stent in the LAD. ~  hosp 7/11 by [facility] w/ CP, Abn Myoview & cath w/ 70% midLAD in-stent resteosis; s/p PTCA & started back on Plavix... ~  she is in Cardiac Rehab & really likes it... ~  7/13:  She went to the ER w/ chest discomfort; ruled out for ischemia (EKG= SBrday, rate52, wnl/NAD) & followed up w/ DrHarwani> he did MYOVIEW 8/13 which was neg for ischemia or infarction, norm wall motion & EF=70%... ~  7/14:  She continues to f/u w/ [facility] her cardiologist;  She will see him soon to f/u from this [place] trip for additional test => Myoview was Abn & Cath 8/14 w/ in-stent restenosis, s/p PCI... ~  10/14: now on ASA81 + Plavix75 and enrolled in cardiac rehab which she loves... (Note: Omep20Bid give via ER 9/14 is being changed to  Protonix40Bid due to her Plavix Rx)... ~  She continues to f/u regularly w/ DrHarwani (we do not have notes from him...  ATRIAL FLUTTER, PAROXYSMAL (ICD-427.32) - this occured 2/10 following her cath, PTCA, stent... he diagnosed flutter w/ 2:1 block... currently taking AMIODARONE 200mg /d... ~  She remains in NSR & denies CP, palpit, SOB,  etc..[name]  HYPERCHOLESTEROLEMIA (ICD-272.0) - on CRESTOR 20mg /d... ~  [place] 11/08 showed TChol 161, TG 39, HDL 62, LDL 92... continue same med. ~  [place] 5/09 showed TChol 209, TG 70, HDL 74, LDL 124... rec- better diet, get weight down! ~  FLP in hosp 2/10 showed TChol 130, TG 69, HDL 45, LDL 71 ~  she reports that [facility] does fasting labs... ~  FLP 7/11 in hosp showed TChol 105, TG 30, HDL 66, LDL 33 ~  FLP 2/12 on Cres20 showed TChol 97, TG 38, HDL 50, LDL 39... copy to Cards ?decr to 10mg ? ~  FLP 2/13 on Cres20 showed TChol 118, TG 42, HDL 60, LDL 50... Continue same. ~  [place] 3/14 on Cres20 showed TChol 128, TG 32, HDL 68, LDL 53  ~  FLP 1/15 on Cres20 showed TChol 133, TG 38, HDL 75, LDL 50 ~  FLP 1/16 on Cres20 showed TChol 154, TG 62, HDL 68, LDL 74  THYROID DISEASE >> Hx Amiodarone induced hyperthyroidism diagnosed 2/12 & referred to [facility], treated w/ PTU (now off) & DrHarwani tapered the Amio... ~  1/13:  She had f/u DrKumar> everything improve4d w/ reduction in Amio dose; clinically euthyroid & no goiter palpated; TFT's in his office were WNL==> TSH=3.74,  FreeT3=2.4 (2.0-4.4), FreeT4=0.91 (0.61-1.12) ~  Labs 3/14 showed TSH= 2.11 ~  Labs 1/15 showed TSH= 3.09 ~  Labs 1/16 showed TSH= 2.31  HIATAL HERNIA (ICD-553.3) - prev on Nexium & Carafate Liq- but changed to PEPCID 20mg Bid by [facility] due to need for Plavix... she has a giant HH measuring 10cm per DrSam, w/ erosions in the pouch... last EGD by DrSam 4/08... hernia is seen on her CXRs. ~  9/14: she went to the ER w/ Epig pain & they felt it was reflux related due to her [place]; they switched  Pepcid to Omep20Bid but she will need to change this to [place] due to the Plavix...  DIVERTICULOSIS OF COLON (ICD-562.10) & COLONIC POLYPS (ICD-211.3) ~  colonoscopy 11/06 showed divertics and 2- 69mm polyps... f/u planned in 4-31yrs... ~  Hosp 11/10 w/ lower GIB believed due to divertics... colonoscopy by DrJacobs showed severe divertics, no bleeding site, 58mm polyp (tubular adenoma) removed from sigmoid...  RENAL INSUFFICIENCY >> she knows to avoid NSAIDs and incr fluid intake... ~  Labs 1/15 showed BUN= 42, Cr= 1.8 ~  Labs 7/15 showed BUN= 32, Cr= 1.7 ~  Labs 1/16 showed BUN= 27, Cr= 1.52  DEGENERATIVE JOINT DISEASE (ICD-715.90) & HIP PAIN, RIGHT (ICD-719.45) - currently taking OTC meds only but DrDean rec Vicodin for as needed use (she prefers Tylenol)... Eval by DrDean 3/09 w/ bone-on-bone right hip, tried injection & pain meds... right THR done 12/09...  VITAMIN D DEFICIENCY (ICD-268.9) ~  labs 5/09 showed Vit D level = 9... pt started on 50K weekly but she switched on her own to 1000u/d OTC. ~  labs 2/12 showed Vit D level = 18... asked to incr Vit D supplement to 2000 u daily (she never did). ~  Labs 2/13 showed Vit D level = 20... Asked to finally incr VitD supplement to 2000u daily... ~  Labs 3/14 showed Vit D level = 12 & Rec to switch to 50K weekly Rx... ~  Labs 1/15 showed VitD level = 50 & rec to continue VitD 50K weekly... ~  Labs 1/16 showed VitD level = 28 & reminded to take the VitD 50K weekly...  ANXIETY (ICD-300.00) - she is requesting a mild nerve pill &  we have written ALPRAZOLAM 0.25mg  Prn use...  ANEMIA (ICD-285.9) - prev on Niferex 150mg /d, now FeSO4 325mg /d w/ Vit C 500mg ... 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 ~  last blood count 11/08 showed Hg=13.7, Fe=56... ~  labs 5/09 showed Hg= 14.2.[name]. ~  labs 2/10 in hosp showed Hg= 13 ~  Hosp 11/10 w/ lower GIB, & Hg down to 8.6- tc 1u PC up to 9.3.Marland KitchenMarland Kitchen ~  labs 12/10 showed Hg= 10.6.[name]. rec> continue oral  Fe w/ VitC... ~  [facility] labs 7/11 showed Hg= 11.6 - 10.5, MCV= 70... rec> restart Fe. ~  labs 2/12 showed Hg= 12.4, MCV= 77, Fe= 56 (sat=10%)... continue Fe w/ VitC. ~  Labs 5/12 in hosp showed Hg= 12.3, MCV= 68, & she continues on FeSO4 daily w/ VitC... ~  Labs 2/13 showed Hg=13.1, MCV=78, Fe=66; rec to continue supplements... ~  Labs 3/14 showed Hg= 13.1 ~  Labs 9/14 showed Hg= 12.8, MCV=74 ~  Labs 1/15 showed Hg= 12.3, MCV=78 and rec to restart FeSO4 325mg  daily... ~  Labs 7/15 showed Hg= 13.2, MCV=78, Fe=73 (15%sat)... rec to continue supplements. ~  Labs 1/16 showed Hg= 13.2, MCV=77, and reminded to continue daily supplements.  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 ~  Immuniz:  She had TDAP 03/2008;  Pneumovax-23 in 05/2009;  Given yearly Flu vaccines...   Past Surgical History:  Procedure Laterality Date  . CORONARY ANGIOPLASTY  10/06/2012  . CORONARY ANGIOPLASTY WITH STENT PLACEMENT  04/2008   "1; Dr Terrence Dupont" (10/06/2012)  . DILATION AND CURETTAGE OF UTERUS    . LEFT HEART CATHETERIZATION WITH CORONARY ANGIOGRAM N/A 10/06/2012   Procedure: LEFT HEART CATHETERIZATION WITH CORONARY ANGIOGRAM;  Surgeon: Clent Demark, MD;  Location: [facility] CATH LAB;  Service: Cardiovascular;  Laterality: N/A;  . TOTAL HIP ARTHROPLASTY Right 12/09   Dr Marlou Sa    Outpatient Encounter Prescriptions as of 04/01/2016  Medication Sig  . acetaminophen (TYLENOL) 325 MG tablet Take 325 mg by mouth every 6 (six) hours as needed. For pain  . ALPRAZolam (XANAX) 0.25 MG tablet take 1 tablet by mouth three times a day if needed  . amiodarone (PACERONE) 200 MG tablet Take 200 mg by mouth daily.   [name] amLODipine (NORVASC) 5 MG tablet Take 5 mg by mouth daily.  [name] aspirin 81 MG tablet Take 81 mg by mouth daily.   . clopidogrel (PLAVIX) 75 MG tablet Take 1 tablet (75 mg total) by mouth daily with breakfast.  . dicyclomine (BENTYL) 20 MG tablet Take 1/2-1  tablet by mouth up to 4 times a day as needed for abdominal cramping  . Ipratropium-Albuterol (COMBIVENT RESPIMAT) 20-100 MCG/ACT AERS respimat Inhale 1 puff into the lungs every 6 (six) hours.  [name] losartan (COZAAR) 100 MG tablet Take 1 tablet (100 mg total) by mouth daily.  . nitroGLYCERIN (NITROSTAT) 0.4 MG SL tablet Place 0.4 mg under the tongue every 5 (five) minutes as needed. May repeat x3   . pantoprazole (PROTONIX) 40 MG tablet Take 40 mg by mouth daily.  . rosuvastatin (CRESTOR) 20 MG tablet Take 20 mg by mouth at bedtime.   . Vitamin D, Ergocalciferol, (DRISDOL) 50000 units CAPS capsule Take 1 capsule (50,000 Units total) by mouth every 7 (seven) days.  . [DISCONTINUED] pantoprazole (PROTONIX) 40 MG tablet take 1 tablet by mouth twice a day  . [DISCONTINUED] HYDROcodone-homatropine (HYDROMET) 5-1.5 MG/5ML syrup Take 2.5  mLs by mouth at bedtime as needed for cough. (Patient not taking: Reported on 04/01/2016)  . [DISCONTINUED] traMADol (ULTRAM) 50 MG tablet Take 0.5-1 tablets (25-50 mg total) by mouth every 8 (eight) hours as needed for moderate pain or severe pain. (Patient not taking: Reported on 04/01/2016)   No facility-administered encounter medications on file as of 04/01/2016.     No Known Allergies    Immunization History  Administered Date(s) Administered  . Influenza Split 02/08/2011, 01/02/2012  . Influenza Whole 12/09/2007, 09/20/2009  . Influenza, High Dose Seasonal PF 01/16/2016  . Influenza,inj,Quad PF,36+ Mos 12/21/2012, 03/23/2014, 04/21/2015  . Pneumococcal Polysaccharide-23 05/23/2009  . Tdap 03/04/2008    Current Medications, Allergies, Past Medical History, Past Surgical History, Family History, and Social History were reviewed in [employer] record.   Review of Systems         See HPI - all other systems neg except as noted... The patient complains of dyspnea on exertion.  The patient denies anorexia, fever, weight loss, weight gain,  vision loss, decreased hearing, hoarseness, chest pain, syncope, peripheral edema, prolonged cough, headaches, hemoptysis, abdominal pain, melena, hematochezia, severe indigestion/heartburn, hematuria, incontinence, muscle weakness, suspicious skin lesions, transient blindness, difficulty walking, depression, unusual weight change, abnormal bleeding, enlarged lymph nodes, and angioedema.     Objective:   Physical Exam     WD, WN, 83 y/o BF in NAD... GENERAL:  Alert & oriented; pleasant & cooperative... HEENT:  [place]/AT, EOM-wnl, EACs-clear, TMs-wnl, NOSE-clear, THROAT-clear & wnl. NECK:  Supple w/ fairROM; no JVD; normal carotid impulses w/o bruits; no thyromegaly or nodules palpated; no lymphadenopathy. CHEST:  Coarse BS w/ no wheezing, no rales, no signs of consolidation. HEART:  Regular Rhythm; gr 1/6 SEM without rubs or gallops detected... ABDOMEN:  Soft & nontender; normal bowel sounds; no organomegaly or masses palpated... EXT: without deformities, mod arthritic changes; s/p right THR; no varicose veins/ +venous insuffic/  tr edema . BACK:  sl tender along right PSIS area... NEURO:  CN's intact; no focal neuro defict... DERM:  no lesions seen...  RADIOLOGY DATA:  Reviewed in the EPIC EMR & discussed w/ the patient...  LABORATORY DATA:  Reviewed in the EPIC EMR & discussed w/ the patient...     Assessment & Plan:    AB>  Improved overall & she stopped all prev meds on her own=> using the COMBIVENT respimat prn but hasn't needed... 04/01/16>   Lougenia remains stable at 83 y/o w/o new complaints or concerns; she is followed regularly for CARDS by [facility] but we do not have any of his notes to review- I explained this to the pt & requested her to authorize him to send [place] notes to be scanned into Epic  HBP>  Good control on the Losartan & Amlodipine; asked to monitor BP at home...  ASHD>  Followed by [name] for Cards> cath 8/14 w/ LAD instent restenosis, s/p successful PCI & she is back  to baseline; Note: ER 9/14 switched her Pepcid to Omep20Bid but her Plavix is critically important to help patency of the LAD stent; therefore we will switch the Omep to [id number]... ~  Stable on meds now without recent problems or need for med adjustments...  AFlutter>  DrHarwani has her on Amio 200mg  daily; her prev thyroid problems have resolved...  CHOL>  On Cres20 w/ good control of FLP...  THYROID DISEASE>  Prev Amio induced hyperthyroidism managed by DrKumar (on PTU prev) & resolved on lower dose of Amio...  GI>  Hx HH/ GERD/ Divertics/ Polyps>  She is followed by DrJacobs for GI as above; current symptoms sound like IBS=> BENTYL20- 1/2 to 1 tab Qid helped...   GU> mild renal insuffic is stable w/ Cr=1.7-1.6 on current meds...  DJD>  She is followed by DrDean for Ortho...  VitD defic>  She remains on VitD 50K weekly, reminded to take it every wk...  Anemia>  Hg is OK but MCV is low, Fe was 73 (improved) & she will continue her Fe & VitC supplements...  Anxiety>  Requesting a mild nerve pill & we wrote for Alprazolam...   Patient's Medications  New Prescriptions   No medications on file  Previous Medications   ACETAMINOPHEN (TYLENOL) 325 MG TABLET    Take 325 mg by mouth every 6 (six) hours as needed. For pain   ALPRAZOLAM (XANAX) 0.25 MG TABLET    take 1 tablet by mouth three times a day if needed   AMIODARONE (PACERONE) 200 MG TABLET    Take 200 mg by mouth daily.    AMLODIPINE (NORVASC) 5 MG TABLET    Take 5 mg by mouth daily.   ASPIRIN 81 MG TABLET    Take 81 mg by mouth daily.    CLOPIDOGREL (PLAVIX) 75 MG TABLET    Take 1 tablet (75 mg total) by mouth daily with breakfast.   DICYCLOMINE (BENTYL) 20 MG TABLET    Take 1/2-1 tablet by mouth up to 4 times a day as needed for abdominal cramping   IPRATROPIUM-ALBUTEROL (COMBIVENT RESPIMAT) 20-100 MCG/ACT AERS RESPIMAT    Inhale 1 puff into the lungs every 6 (six) hours.   LOSARTAN (COZAAR) 100 MG TABLET    Take 1  tablet (100 mg total) by mouth daily.   NITROGLYCERIN (NITROSTAT) 0.4 MG SL TABLET    Place 0.4 mg under the tongue every 5 (five) minutes as needed. May repeat x3    PANTOPRAZOLE (PROTONIX) 40 MG TABLET    Take 40 mg by mouth daily.   ROSUVASTATIN (CRESTOR) 20 MG TABLET    Take 20 mg by mouth at bedtime.    VITAMIN D, ERGOCALCIFEROL, (DRISDOL) 50000 UNITS CAPS CAPSULE    Take 1 capsule (50,000 Units total) by mouth every 7 (seven) days.  Modified Medications   No medications on file  Discontinued Medications   HYDROCODONE-HOMATROPINE (HYDROMET) 5-1.5 MG/5ML SYRUP    Take 2.5 mLs by mouth at bedtime as needed for cough.   PANTOPRAZOLE (PROTONIX) 40 MG TABLET    take 1 tablet by mouth twice a day   TRAMADOL (ULTRAM) 50 MG TABLET    Take 0.5-1 tablets (25-50 mg total) by mouth every 8 (eight) hours as needed for moderate pain or severe pain.

## 2016-04-02 ENCOUNTER — Other Ambulatory Visit: Payer: Self-pay | Admitting: Cardiology

## 2016-04-02 DIAGNOSIS — I25119 Atherosclerotic heart disease of native coronary artery with unspecified angina pectoris: Secondary | ICD-10-CM | POA: Diagnosis not present

## 2016-04-02 DIAGNOSIS — I480 Paroxysmal atrial fibrillation: Secondary | ICD-10-CM | POA: Diagnosis not present

## 2016-04-02 DIAGNOSIS — I129 Hypertensive chronic kidney disease with stage 1 through stage 4 chronic kidney disease, or unspecified chronic kidney disease: Secondary | ICD-10-CM | POA: Diagnosis not present

## 2016-04-02 DIAGNOSIS — R079 Chest pain, unspecified: Secondary | ICD-10-CM

## 2016-04-02 DIAGNOSIS — N189 Chronic kidney disease, unspecified: Secondary | ICD-10-CM | POA: Diagnosis not present

## 2016-04-02 DIAGNOSIS — I252 Old myocardial infarction: Secondary | ICD-10-CM | POA: Diagnosis not present

## 2016-04-03 ENCOUNTER — Telehealth: Payer: Self-pay | Admitting: Pulmonary Disease

## 2016-04-03 ENCOUNTER — Encounter (HOSPITAL_COMMUNITY): Payer: Self-pay

## 2016-04-03 NOTE — Telephone Encounter (Signed)
Called and spoke with pt and she is aware of lab results.  Pt voiced her understanding and nothing further is needed.

## 2016-04-04 LAB — VITAMIN D 1,25 DIHYDROXY
VITAMIN D2 1, 25 (OH): 49 pg/mL
Vitamin D 1, 25 (OH)2 Total: 49 pg/mL (ref 18–72)
Vitamin D3 1, 25 (OH)2: <8 pg/mL

## 2016-04-05 ENCOUNTER — Encounter (HOSPITAL_COMMUNITY): Payer: Self-pay

## 2016-04-05 DIAGNOSIS — I2510 Atherosclerotic heart disease of native coronary artery without angina pectoris: Secondary | ICD-10-CM | POA: Insufficient documentation

## 2016-04-08 ENCOUNTER — Encounter (HOSPITAL_COMMUNITY): Payer: Self-pay

## 2016-04-10 ENCOUNTER — Encounter (HOSPITAL_COMMUNITY)
Admission: RE | Admit: 2016-04-10 | Discharge: 2016-04-10 | Disposition: A | Discharge status: Home / Self Care | Payer: Medicare Other | Source: Ambulatory Visit | Attending: Cardiology | Admitting: Cardiology

## 2016-04-10 ENCOUNTER — Encounter (HOSPITAL_COMMUNITY): Payer: Self-pay

## 2016-04-10 DIAGNOSIS — N189 Chronic kidney disease, unspecified: Secondary | ICD-10-CM | POA: Diagnosis not present

## 2016-04-10 DIAGNOSIS — I480 Paroxysmal atrial fibrillation: Secondary | ICD-10-CM | POA: Diagnosis not present

## 2016-04-10 DIAGNOSIS — Z801 Family history of malignant neoplasm of trachea, bronchus and lung: Secondary | ICD-10-CM | POA: Diagnosis not present

## 2016-04-10 DIAGNOSIS — R001 Bradycardia, unspecified: Secondary | ICD-10-CM | POA: Diagnosis not present

## 2016-04-10 DIAGNOSIS — I129 Hypertensive chronic kidney disease with stage 1 through stage 4 chronic kidney disease, or unspecified chronic kidney disease: Secondary | ICD-10-CM | POA: Diagnosis not present

## 2016-04-10 DIAGNOSIS — I739 Peripheral vascular disease, unspecified: Secondary | ICD-10-CM | POA: Diagnosis not present

## 2016-04-10 DIAGNOSIS — Z7902 Long term (current) use of antithrombotics/antiplatelets: Secondary | ICD-10-CM | POA: Diagnosis not present

## 2016-04-10 DIAGNOSIS — T461X5A Adverse effect of calcium-channel blockers, initial encounter: Secondary | ICD-10-CM | POA: Diagnosis not present

## 2016-04-10 DIAGNOSIS — I249 Acute ischemic heart disease, unspecified: Secondary | ICD-10-CM | POA: Diagnosis not present

## 2016-04-10 DIAGNOSIS — R079 Chest pain, unspecified: Secondary | ICD-10-CM

## 2016-04-10 DIAGNOSIS — R0602 Shortness of breath: Secondary | ICD-10-CM | POA: Diagnosis not present

## 2016-04-10 DIAGNOSIS — I10 Essential (primary) hypertension: Secondary | ICD-10-CM | POA: Diagnosis not present

## 2016-04-10 DIAGNOSIS — M1990 Unspecified osteoarthritis, unspecified site: Secondary | ICD-10-CM | POA: Diagnosis not present

## 2016-04-10 DIAGNOSIS — Z96641 Presence of right artificial hip joint: Secondary | ICD-10-CM | POA: Diagnosis not present

## 2016-04-10 DIAGNOSIS — I252 Old myocardial infarction: Secondary | ICD-10-CM | POA: Diagnosis not present

## 2016-04-10 DIAGNOSIS — Z955 Presence of coronary angioplasty implant and graft: Secondary | ICD-10-CM | POA: Diagnosis not present

## 2016-04-10 DIAGNOSIS — I25110 Atherosclerotic heart disease of native coronary artery with unstable angina pectoris: Secondary | ICD-10-CM | POA: Diagnosis not present

## 2016-04-10 DIAGNOSIS — Z79899 Other long term (current) drug therapy: Secondary | ICD-10-CM | POA: Diagnosis not present

## 2016-04-10 DIAGNOSIS — E785 Hyperlipidemia, unspecified: Secondary | ICD-10-CM | POA: Diagnosis not present

## 2016-04-10 DIAGNOSIS — I25119 Atherosclerotic heart disease of native coronary artery with unspecified angina pectoris: Secondary | ICD-10-CM | POA: Diagnosis not present

## 2016-04-10 DIAGNOSIS — Z8249 Family history of ischemic heart disease and other diseases of the circulatory system: Secondary | ICD-10-CM | POA: Diagnosis not present

## 2016-04-10 DIAGNOSIS — Z7982 Long term (current) use of aspirin: Secondary | ICD-10-CM | POA: Diagnosis not present

## 2016-04-10 DIAGNOSIS — K219 Gastro-esophageal reflux disease without esophagitis: Secondary | ICD-10-CM | POA: Diagnosis not present

## 2016-04-10 MED ORDER — TECHNETIUM TC 99M TETROFOSMIN IV KIT
30.0000 | PACK | Freq: Once | INTRAVENOUS | Status: AC | PRN
  Administered 2016-04-10: 30 via INTRAVENOUS

## 2016-04-10 MED ORDER — REGADENOSON 0.4 MG/5ML IV SOLN: 0.4000 mg | Freq: Once | INTRAVENOUS | Status: DC

## 2016-04-10 MED ORDER — REGADENOSON 0.4 MG/5ML IV SOLN
INTRAVENOUS | Status: AC
  Filled 2016-04-10: qty 5

## 2016-04-10 MED ORDER — TECHNETIUM TC 99M TETROFOSMIN IV KIT
10.0000 | PACK | Freq: Once | INTRAVENOUS | Status: AC | PRN
  Administered 2016-04-10: 10 via INTRAVENOUS

## 2016-04-12 ENCOUNTER — Encounter (HOSPITAL_COMMUNITY): Payer: Self-pay

## 2016-04-13 ENCOUNTER — Other Ambulatory Visit (HOSPITAL_COMMUNITY): Payer: Self-pay

## 2016-04-13 ENCOUNTER — Emergency Department (HOSPITAL_COMMUNITY): Payer: Medicare Other

## 2016-04-13 ENCOUNTER — Inpatient Hospital Stay (HOSPITAL_COMMUNITY)
Admission: EM | Admit: 2016-04-13 | Discharge: 2016-04-15 | DRG: 287 | Disposition: A | Discharge status: Home / Self Care | Payer: Medicare Other | Attending: Cardiology | Admitting: Cardiology

## 2016-04-13 ENCOUNTER — Encounter (HOSPITAL_COMMUNITY): Payer: Self-pay

## 2016-04-13 ENCOUNTER — Other Ambulatory Visit: Payer: Self-pay

## 2016-04-13 DIAGNOSIS — Z8249 Family history of ischemic heart disease and other diseases of the circulatory system: Secondary | ICD-10-CM

## 2016-04-13 DIAGNOSIS — M1990 Unspecified osteoarthritis, unspecified site: Secondary | ICD-10-CM | POA: Diagnosis present

## 2016-04-13 DIAGNOSIS — Z7902 Long term (current) use of antithrombotics/antiplatelets: Secondary | ICD-10-CM | POA: Diagnosis not present

## 2016-04-13 DIAGNOSIS — Z7982 Long term (current) use of aspirin: Secondary | ICD-10-CM | POA: Diagnosis not present

## 2016-04-13 DIAGNOSIS — K219 Gastro-esophageal reflux disease without esophagitis: Secondary | ICD-10-CM | POA: Diagnosis present

## 2016-04-13 DIAGNOSIS — I739 Peripheral vascular disease, unspecified: Secondary | ICD-10-CM | POA: Diagnosis not present

## 2016-04-13 DIAGNOSIS — E785 Hyperlipidemia, unspecified: Secondary | ICD-10-CM | POA: Diagnosis present

## 2016-04-13 DIAGNOSIS — I480 Paroxysmal atrial fibrillation: Secondary | ICD-10-CM | POA: Diagnosis not present

## 2016-04-13 DIAGNOSIS — R079 Chest pain, unspecified: Secondary | ICD-10-CM | POA: Diagnosis not present

## 2016-04-13 DIAGNOSIS — T461X5A Adverse effect of calcium-channel blockers, initial encounter: Secondary | ICD-10-CM | POA: Diagnosis not present

## 2016-04-13 DIAGNOSIS — I252 Old myocardial infarction: Secondary | ICD-10-CM

## 2016-04-13 DIAGNOSIS — R001 Bradycardia, unspecified: Secondary | ICD-10-CM | POA: Diagnosis not present

## 2016-04-13 DIAGNOSIS — I200 Unstable angina: Secondary | ICD-10-CM | POA: Diagnosis present

## 2016-04-13 DIAGNOSIS — I25110 Atherosclerotic heart disease of native coronary artery with unstable angina pectoris: Principal | ICD-10-CM | POA: Diagnosis present

## 2016-04-13 DIAGNOSIS — Z801 Family history of malignant neoplasm of trachea, bronchus and lung: Secondary | ICD-10-CM | POA: Diagnosis not present

## 2016-04-13 DIAGNOSIS — R9439 Abnormal result of other cardiovascular function study: Secondary | ICD-10-CM | POA: Diagnosis not present

## 2016-04-13 DIAGNOSIS — I249 Acute ischemic heart disease, unspecified: Secondary | ICD-10-CM | POA: Diagnosis not present

## 2016-04-13 DIAGNOSIS — Z955 Presence of coronary angioplasty implant and graft: Secondary | ICD-10-CM

## 2016-04-13 DIAGNOSIS — Z79899 Other long term (current) drug therapy: Secondary | ICD-10-CM | POA: Diagnosis not present

## 2016-04-13 DIAGNOSIS — I10 Essential (primary) hypertension: Secondary | ICD-10-CM | POA: Diagnosis present

## 2016-04-13 DIAGNOSIS — Z96641 Presence of right artificial hip joint: Secondary | ICD-10-CM | POA: Diagnosis present

## 2016-04-13 LAB — CBC WITH DIFFERENTIAL/PLATELET
BASOS ABS: 0 10*3/uL (ref 0.0–0.1)
BASOS PCT: 0 %
EOS PCT: 1 %
Eosinophils Absolute: 0 10*3/uL (ref 0.0–0.7)
HCT: 37.6 % (ref 36.0–46.0)
Hemoglobin: 12.7 g/dL (ref 12.0–15.0)
Lymphocytes Relative: 38 %
Lymphs Abs: 2.5 10*3/uL (ref 0.7–4.0)
MCH: 25.3 pg — ABNORMAL LOW (ref 26.0–34.0)
MCHC: 33.8 g/dL (ref 30.0–36.0)
MCV: 75 fL — ABNORMAL LOW (ref 78.0–100.0)
MONO ABS: 0.4 10*3/uL (ref 0.1–1.0)
Monocytes Relative: 6 %
Neutro Abs: 3.6 10*3/uL (ref 1.7–7.7)
Neutrophils Relative %: 55 %
PLATELETS: 126 10*3/uL — AB (ref 150–400)
RBC: 5.01 MIL/uL (ref 3.87–5.11)
RDW: 16.2 % — AB (ref 11.5–15.5)
WBC: 6.6 10*3/uL (ref 4.0–10.5)

## 2016-04-13 LAB — I-STAT TROPONIN, ED
TROPONIN I, POC: 0.02 ng/mL (ref 0.00–0.08)
Troponin i, poc: 0.02 ng/mL (ref 0.00–0.08)

## 2016-04-13 LAB — BASIC METABOLIC PANEL
ANION GAP: 8 (ref 5–15)
Anion gap: 6 (ref 5–15)
BUN: 21 mg/dL — AB (ref 6–20)
BUN: 19 mg/dL (ref 6–20)
CO2: 24 mmol/L (ref 22–32)
CO2: 23 mmol/L (ref 22–32)
CREATININE: 1.31 mg/dL — AB (ref 0.44–1.00)
Calcium: 9.2 mg/dL (ref 8.9–10.3)
Calcium: 8.9 mg/dL (ref 8.9–10.3)
Chloride: 109 mmol/L (ref 101–111)
Chloride: 111 mmol/L (ref 101–111)
Creatinine, Ser: 1.44 mg/dL — ABNORMAL HIGH (ref 0.44–1.00)
GFR, EST AFRICAN AMERICAN: 36 mL/min — AB (ref >60)
GFR, EST AFRICAN AMERICAN: 41 mL/min — AB (ref >60)
GFR, EST NON AFRICAN AMERICAN: 31 mL/min — AB (ref >60)
GFR, EST NON AFRICAN AMERICAN: 35 mL/min — AB (ref >60)
Glucose, Bld: 99 mg/dL (ref 65–99)
Glucose, Bld: 92 mg/dL (ref 65–99)
POTASSIUM: 4.3 mmol/L (ref 3.5–5.1)
Potassium: 4 mmol/L (ref 3.5–5.1)
SODIUM: 141 mmol/L (ref 135–145)
SODIUM: 140 mmol/L (ref 135–145)

## 2016-04-13 LAB — CBC
HCT: 38.9 % (ref 36.0–46.0)
Hemoglobin: 13.2 g/dL (ref 12.0–15.0)
MCH: 25.3 pg — ABNORMAL LOW (ref 26.0–34.0)
MCHC: 33.9 g/dL (ref 30.0–36.0)
MCV: 74.5 fL — ABNORMAL LOW (ref 78.0–100.0)
PLATELETS: 126 10*3/uL — AB (ref 150–400)
RBC: 5.22 MIL/uL — ABNORMAL HIGH (ref 3.87–5.11)
RDW: 15.7 % — AB (ref 11.5–15.5)
WBC: 6.6 10*3/uL (ref 4.0–10.5)

## 2016-04-13 LAB — HEPARIN LEVEL (UNFRACTIONATED): HEPARIN UNFRACTIONATED: 0.49 [IU]/mL (ref 0.30–0.70)

## 2016-04-13 LAB — TROPONIN I

## 2016-04-13 MED ORDER — HEPARIN BOLUS VIA INFUSION
4000.0000 [IU] | Freq: Once | INTRAVENOUS | Status: AC
  Administered 2016-04-13: 4000 [IU] via INTRAVENOUS
  Filled 2016-04-13: qty 4000

## 2016-04-13 MED ORDER — NITROGLYCERIN 0.4 MG SL SUBL: 0.4000 mg | SUBLINGUAL_TABLET | SUBLINGUAL | Status: DC | PRN

## 2016-04-13 MED ORDER — HEPARIN (PORCINE) IN NACL 100-0.45 UNIT/ML-% IJ SOLN
800.0000 [IU]/h | INTRAMUSCULAR | Status: DC
  Administered 2016-04-13: 800 [IU]/h via INTRAVENOUS
  Filled 2016-04-13 (×3): qty 250

## 2016-04-13 MED ORDER — ROSUVASTATIN CALCIUM 10 MG PO TABS
20.0000 mg | ORAL_TABLET | Freq: Every day | ORAL | Status: DC
  Administered 2016-04-13 – 2016-04-14 (×2): 20 mg via ORAL
  Filled 2016-04-13 (×2): qty 2
  Filled 2016-04-13 (×2): qty 1

## 2016-04-13 MED ORDER — PANTOPRAZOLE SODIUM 40 MG PO TBEC
40.0000 mg | DELAYED_RELEASE_TABLET | Freq: Every day | ORAL | Status: DC
Start: 2016-04-13 — End: 2016-04-15
  Administered 2016-04-13 – 2016-04-15 (×3): 40 mg via ORAL
  Filled 2016-04-13 (×3): qty 1

## 2016-04-13 MED ORDER — IPRATROPIUM-ALBUTEROL 0.5-2.5 (3) MG/3ML IN SOLN
3.0000 mL | Freq: Four times a day (QID) | RESPIRATORY_TRACT | Status: DC
  Filled 2016-04-13 (×2): qty 3

## 2016-04-13 MED ORDER — SODIUM CHLORIDE 0.9 % IV SOLN
INTRAVENOUS | Status: DC
  Administered 2016-04-13 – 2016-04-14 (×2): via INTRAVENOUS

## 2016-04-13 MED ORDER — IPRATROPIUM-ALBUTEROL 0.5-2.5 (3) MG/3ML IN SOLN: 3.0000 mL | Freq: Four times a day (QID) | RESPIRATORY_TRACT | Status: DC | PRN

## 2016-04-13 MED ORDER — ONDANSETRON HCL 4 MG/2ML IJ SOLN: 4.0000 mg | Freq: Four times a day (QID) | INTRAMUSCULAR | Status: DC | PRN

## 2016-04-13 MED ORDER — IPRATROPIUM-ALBUTEROL 20-100 MCG/ACT IN AERS: 1.0000 | INHALATION_SPRAY | Freq: Four times a day (QID) | RESPIRATORY_TRACT | Status: DC

## 2016-04-13 MED ORDER — ASPIRIN 300 MG RE SUPP: 300.0000 mg | RECTAL | Status: AC

## 2016-04-13 MED ORDER — NITROGLYCERIN IN D5W 200-5 MCG/ML-% IV SOLN
5.0000 ug/min | INTRAVENOUS | Status: DC
  Administered 2016-04-13: 5 ug/min via INTRAVENOUS
  Filled 2016-04-13: qty 250

## 2016-04-13 MED ORDER — METOPROLOL TARTRATE 12.5 MG HALF TABLET
12.5000 mg | ORAL_TABLET | Freq: Two times a day (BID) | ORAL | Status: DC
  Administered 2016-04-13: 12.5 mg via ORAL
  Filled 2016-04-13 (×2): qty 1

## 2016-04-13 MED ORDER — AMIODARONE HCL 200 MG PO TABS
200.0000 mg | ORAL_TABLET | Freq: Every day | ORAL | Status: DC
  Administered 2016-04-13: 200 mg via ORAL
  Filled 2016-04-13: qty 1

## 2016-04-13 MED ORDER — CLOPIDOGREL BISULFATE 75 MG PO TABS
75.0000 mg | ORAL_TABLET | Freq: Every day | ORAL | Status: DC
  Administered 2016-04-14 – 2016-04-15 (×2): 75 mg via ORAL
  Filled 2016-04-13 (×2): qty 1

## 2016-04-13 MED ORDER — SODIUM CHLORIDE 0.9 % IV SOLN
Freq: Once | INTRAVENOUS | Status: AC
  Administered 2016-04-13: 50 mL/h via INTRAVENOUS

## 2016-04-13 MED ORDER — ASPIRIN 81 MG PO CHEW
324.0000 mg | CHEWABLE_TABLET | ORAL | Status: AC
  Administered 2016-04-13: 324 mg via ORAL
  Filled 2016-04-13: qty 4

## 2016-04-13 MED ORDER — ACETAMINOPHEN 325 MG PO TABS
650.0000 mg | ORAL_TABLET | ORAL | Status: DC | PRN
  Administered 2016-04-15: 650 mg via ORAL
  Filled 2016-04-13: qty 2

## 2016-04-13 MED ORDER — ASPIRIN EC 81 MG PO TBEC
81.0000 mg | DELAYED_RELEASE_TABLET | Freq: Every day | ORAL | Status: DC
  Administered 2016-04-14 – 2016-04-15 (×2): 81 mg via ORAL
  Filled 2016-04-13 (×2): qty 1

## 2016-04-13 NOTE — ED Triage Notes (Signed)
Pt presents to the ed with complaints of chest pain and nausea that started at 0800.  She took 3 nitro and 4 baby aspirin, her pain has gone from an 8/10 to a 3/10.  She had a stress test done on Thursday and is scheduled for a cath this Thursday, she has a history of MI with stent placement, alert and oriented.

## 2016-04-13 NOTE — ED Notes (Signed)
Report to [facility] RN

## 2016-04-13 NOTE — Progress Notes (Signed)
[place] for Heparin  Indication: chest pain/ACS  No Known Allergies  Patient Measurements: Height: 5\' 2"  (157.5 cm) Weight: 152 lb (68.9 kg) IBW/kg (Calculated) : 50.1   Vital Signs: Temp: 98.3 F (36.8 C) (02/10 1649) Temp Source: Oral (02/10 1649) BP: 115/52 (02/10 1649) Pulse Rate: 53 (02/10 1649)  Labs:  Recent Labs  04/13/16 1000 04/13/16 1331 04/13/16 1754  HGB 13.2 12.7  --   HCT 38.9 37.6  --   PLT 126* 126*  --   HEPARINUNFRC  --   --  0.49  CREATININE 1.44* 1.31*  --   TROPONINI  --  <0.03 <0.03    Estimated Creatinine Clearance: 26.5 mL/min (by C-G formula based on SCr of 1.31 mg/dL (H)).   Medical History: Past Medical History:  Diagnosis Date  . Anemia   . Anxiety   . Atherosclerotic heart disease   . Atrial flutter, paroxysmal ([place])   . Colon polyp   . Diverticulosis of colon   . DJD (degenerative joint disease)    "legs" (10/06/2012)  . Exertional shortness of breath   . GERD (gastroesophageal reflux disease)   . Hiatal hernia   . History of blood transfusion ? 2010  . HTN (hypertension)   . Hypercholesterolemia   . Myocardial infarction 2010  . Pneumonia    "once; years ago" (10/06/2012)  . PVD (peripheral vascular disease) ([place])   . Vitamin D deficiency       Assessment: 60yof with HX CAD and recent stress test with moderate perfusion defect admitted with USAP.  Plan to cath on Monday.  Heparin per pharmacy ordered.  CBC stable.    Heparin level is therapeutic at 0.49 on heparin 800 units/hr. No issues with infusion or bleeding noted. Will recheck with AM labs.  Goal of Therapy:  Heparin level 0.3-0.7 units/ml Monitor platelets by anticoagulation protocol: Yes   Plan:  Heparin 800 units/hr Heparin level, CBC daily  Andrey Cota. Diona Foley, PharmD, BCPS Clinical Pharmacist 04/13/2016 7:35 PM

## 2016-04-13 NOTE — H&P (Signed)
Virginia Casey is an 83 y.o. female.   Chief Complaint: left-sided chest pain radiating to left arm HPI: Patient is 83 year old female with past medical history significant for multiple medical problems i.e. coronary artery disease history of non-Q-wave myocardial infarction in the past status post PTCA stenting to mid LAD, hypertension, hyperlipidemia, history of paroxysmal atrial fibrillation and remote past, history of GI bleed in the past anemia of chronic disease, peripheral vascular disease, came to the ER by EMS complaining of left-sided chest pain radiating to the left arm grade 8/10 associated with nausea and mild diaphoresis and took 1 sublingual nitroglycerin without relief so called EMS and received 2 more sublingual nitroglycerin and 4 baby aspirin with relief of chest pain. Patient denies any palpitations lightheadedness or syncope. Denies shortness of breath. Denies cough fever chills. Patient had similar complaints approximately 2 weeks ago and had nuclear stress test on 04/10/2016 which showed moderate perfusion defect in the lateral wall with normal EF.  Past Medical History:  Diagnosis Date  . Anemia   . Anxiety   . Atherosclerotic heart disease   . Atrial flutter, paroxysmal ([place])   . Colon polyp   . Diverticulosis of colon   . DJD (degenerative joint disease)    "legs" (10/06/2012)  . Exertional shortness of breath   . GERD (gastroesophageal reflux disease)   . Hiatal hernia   . History of blood transfusion ? 2010  . HTN (hypertension)   . Hypercholesterolemia   . Myocardial infarction 2010  . Pneumonia    "once; years ago" (10/06/2012)  . PVD (peripheral vascular disease) ([place])   . Vitamin D deficiency     Past Surgical History:  Procedure Laterality Date  . CORONARY ANGIOPLASTY  10/06/2012  . CORONARY ANGIOPLASTY WITH STENT PLACEMENT  04/2008   "1; Dr Terrence Dupont" (10/06/2012)  . DILATION AND CURETTAGE OF UTERUS    . LEFT HEART CATHETERIZATION WITH CORONARY ANGIOGRAM N/A  10/06/2012   Procedure: LEFT HEART CATHETERIZATION WITH CORONARY ANGIOGRAM;  Surgeon: Clent Demark, MD;  Location: [facility] CATH LAB;  Service: Cardiovascular;  Laterality: N/A;  . TOTAL HIP ARTHROPLASTY Right 12/09   Dr Marlou Sa    Family History  Problem Relation Age of Onset  . Heart disease Mother   . Rheum arthritis Mother   . Heart disease Maternal Grandmother   . Heart disease Maternal Grandfather   . Rheum arthritis Father   . Lung cancer Father   . Stroke Maternal Aunt    Social History:  reports that she has never smoked. She has never used smokeless tobacco. She reports that she does not drink alcohol or use drugs.  Allergies: No Known Allergies   (Not in a hospital admission)  Results for orders placed or performed during the hospital encounter of 04/13/16 (from the past 48 hour(s))  Basic metabolic panel     Status: Abnormal   Collection Time: 04/13/16 10:00 AM  Result Value Ref Range   Sodium 141 135 - 145 mmol/L   Potassium 4.3 3.5 - 5.1 mmol/L   Chloride 109 101 - 111 mmol/L   CO2 24 22 - 32 mmol/L   Glucose, Bld 99 65 - 99 mg/dL   BUN 21 (H) 6 - 20 mg/dL   Creatinine, Ser 1.44 (H) 0.44 - 1.00 mg/dL   Calcium 9.2 8.9 - 10.3 mg/dL   GFR calc non Af Amer 31 (L) >60 mL/min   GFR calc Af Amer 36 (L) >60 mL/min    Comment: (NOTE) The  eGFR has been calculated using the CKD EPI equation. This calculation has not been validated in all clinical situations. eGFR's persistently <60 mL/min signify possible Chronic Kidney Disease.    Anion gap 8 5 - 15  CBC     Status: Abnormal   Collection Time: 04/13/16 10:00 AM  Result Value Ref Range   WBC 6.6 4.0 - 10.5 K/uL   RBC 5.22 (H) 3.87 - 5.11 MIL/uL   Hemoglobin 13.2 12.0 - 15.0 g/dL   HCT 38.9 36.0 - 46.0 %   MCV 74.5 (L) 78.0 - 100.0 fL   MCH 25.3 (L) 26.0 - 34.0 pg   MCHC 33.9 30.0 - 36.0 g/dL   RDW 15.7 (H) 11.5 - 15.5 %   Platelets 126 (L) 150 - 400 K/uL  I-stat troponin, ED     Status: None   Collection Time:  04/13/16 10:08 AM  Result Value Ref Range   Troponin i, poc 0.02 0.00 - 0.08 ng/mL   Comment 3            Comment: Due to the release kinetics of cTnI, a negative result within the first hours of the onset of symptoms does not rule out myocardial infarction with certainty. If myocardial infarction is still suspected, repeat the test at appropriate intervals.    *Note: Due to a large number of results and/or encounters for the requested time period, some results have not been displayed. A complete set of results can be found in Results Review.   Dg Chest 2 View  Result Date: 04/13/2016 CLINICAL DATA:  Chest pain EXAM: CHEST  2 VIEW COMPARISON:  07/21/2015 FINDINGS: Cardiomegaly. Large hiatal hernia. Right basilar atelectasis or infiltrate. Left base densities likely reflects scarring. No visible effusions or acute bony abnormality. IMPRESSION: Cardiomegaly. Right base atelectasis or infiltrate. Electronically Signed   By: Rolm Baptise M.D.   On: 04/13/2016 10:05    Review of Systems  Constitutional: Positive for diaphoresis. Negative for chills and fever.  Eyes: Positive for double vision.  Respiratory: Negative for cough and shortness of breath.   Cardiovascular: Positive for chest pain. Negative for orthopnea, claudication and leg swelling.  Gastrointestinal: Positive for nausea. Negative for abdominal pain and vomiting.  Genitourinary: Negative for dysuria.  Neurological: Negative for dizziness.    Blood pressure 122/60, pulse 97, temperature 98.4 F (36.9 C), temperature source Oral, resp. rate 14, height '5\' 2"'$  (1.575 m), weight 152 lb (68.9 kg), SpO2 91 %. Physical Exam  Constitutional: She is oriented to person, place, and time.  HENT:  Head: Normocephalic and atraumatic.  Eyes: Conjunctivae are normal. Pupils are equal, round, and reactive to light. No scleral icterus.  Neck: Normal range of motion. Neck supple. No JVD present. No tracheal deviation present. No thyromegaly  present.  Cardiovascular: Normal rate and regular rhythm.   Murmur (Soft systolic murmur and S4 gallop noted) heard. Respiratory: Effort normal and breath sounds normal. No respiratory distress. She has no wheezes. She has no rales.  GI: Soft. Bowel sounds are normal. She exhibits no distension. There is no tenderness. There is no rebound and no guarding.  Musculoskeletal: She exhibits edema. She exhibits no tenderness or deformity.  Neurological: She is alert and oriented to person, place, and time.     Assessment/Plan Unstable angina with positive nuclear stress test recently rule out MI Rule out progression of CAD Coronary artery disease history of non-Q-wave MI in the past status post PCI to mid LAD in the past Hypertension Hyperlipidemia History  of paroxysmal atrial fibrillation in the past History of GI bleed in the past Peripheral vascular disease GERD Degenerative joint disease Anemia of chronic disease Plan As per orders Discussed with patient and family regarding left cardiac catheterization possible PTCA stenting its risk and benefits and consents for PCI plan Will schedule her for Monday  Charolette Forward, MD 04/13/2016, 11:59 AM

## 2016-04-13 NOTE — Progress Notes (Signed)
Patient arrived from the ED on a stretcher , assesment completed see flowsheet, placed on tele ccmd notified, patient oriented to room and staff, bed in lowest position call bell within reach will continue to monitor

## 2016-04-13 NOTE — ED Provider Notes (Addendum)
[facility] DEPT Provider Note   CSN: [id number] Arrival date & time: 04/13/16  [id number]     History   Chief Complaint Chief Complaint  Patient presents with  . Chest Pain    HPI Virginia Casey is a 83 y.o. female.  She presents for evaluation of an episode of chest pain which started 5 minutes after she awoke, while still in bed, this morning. At that time, she noticed that she was sweating. He took a single nitroglycerin, then her daughter called EMS. They advised that she take 4 baby aspirin, which she did. During EMS transport, she received 2 more nitroglycerin, with resolution of her chest pain. This morning the pain was accompanied with left arm pain. Left arm pain is improved but not entirely resolved at this time. No recent trauma. She did not take her morning medications, because she has not eaten yet. She denies recent fever, chills, cough, shortness of breath, nausea or vomiting. Last week she had an episode of chest pain, for which she took nitroglycerin once, and subsequently had a nuclear medicine imaging test done. Apparently because of this and her clinical syndrome, her cardiologist has elected to do a cardiac catheter later next week. There are no other known modifying factors.  HPI  Past Medical History:  Diagnosis Date  . Anemia   . Anxiety   . Atherosclerotic heart disease   . Atrial flutter, paroxysmal ([place])   . Colon polyp   . Diverticulosis of colon   . DJD (degenerative joint disease)    "legs" (10/06/2012)  . Exertional shortness of breath   . GERD (gastroesophageal reflux disease)   . Hiatal hernia   . History of blood transfusion ? 2010  . HTN (hypertension)   . Hypercholesterolemia   . Myocardial infarction 2010  . Pneumonia    "once; years ago" (10/06/2012)  . PVD (peripheral vascular disease) ([place])   . Vitamin D deficiency     Patient Active Problem List   Diagnosis Date Noted  . Fall 05/12/2015  . IBS (irritable bowel syndrome) 07/30/2014  .  Asthmatic bronchitis 07/16/2010  . Disorder of thyroid 04/18/2010  . Vitamin D deficiency 07/26/2008  . BACK PAIN, LUMBAR 07/26/2008  . ATRIAL FLUTTER, PAROXYSMAL 07/11/2008  . Diverticulosis of large intestine 07/09/2007  . COLONIC POLYPS 03/16/2007  . HYPERCHOLESTEROLEMIA 03/16/2007  . Anemia 03/16/2007  . Anxiety state 03/16/2007  . Essential hypertension 03/16/2007  . Coronary atherosclerosis 03/16/2007  . Peripheral vascular disease ([place]) 03/16/2007  . Diaphragmatic hernia 03/16/2007  . Osteoarthritis 03/16/2007    Past Surgical History:  Procedure Laterality Date  . CORONARY ANGIOPLASTY  10/06/2012  . CORONARY ANGIOPLASTY WITH STENT PLACEMENT  04/2008   "1; Dr Terrence Dupont" (10/06/2012)  . DILATION AND CURETTAGE OF UTERUS    . LEFT HEART CATHETERIZATION WITH CORONARY ANGIOGRAM N/A 10/06/2012   Procedure: LEFT HEART CATHETERIZATION WITH CORONARY ANGIOGRAM;  Surgeon: Clent Demark, MD;  Location: [facility] CATH LAB;  Service: Cardiovascular;  Laterality: N/A;  . TOTAL HIP ARTHROPLASTY Right 12/09   Dr Marlou Sa    OB History    No data available       Home Medications    Prior to Admission medications   Medication Sig Start Date End Date Taking? Authorizing Provider  acetaminophen (TYLENOL) 325 MG tablet Take 325 mg by mouth every 6 (six) hours as needed. For pain   Yes Historical Provider, MD  ALPRAZolam Duanne Moron) 0.25 MG tablet take 1 tablet by mouth three times a day  if needed 03/12/16  Yes Noralee Space, MD  amiodarone (PACERONE) 200 MG tablet Take 200 mg by mouth daily.    Yes Historical Provider, MD  amLODipine (NORVASC) 5 MG tablet Take 5 mg by mouth daily.   Yes Historical Provider, MD  aspirin 81 MG tablet Take 324 mg by mouth daily.    Yes Historical Provider, MD  clopidogrel (PLAVIX) 75 MG tablet Take 1 tablet (75 mg total) by mouth daily with breakfast. 07/26/15  Yes Noralee Space, MD  dicyclomine (BENTYL) 20 MG tablet Take 1/2-1 tablet by mouth up to 4 times a day as needed for  abdominal cramping 07/27/14  Yes Noralee Space, MD  Ipratropium-Albuterol (COMBIVENT RESPIMAT) 20-100 MCG/ACT AERS respimat Inhale 1 puff into the lungs every 6 (six) hours. 09/22/12  Yes Noralee Space, MD  losartan (COZAAR) 100 MG tablet Take 1 tablet (100 mg total) by mouth daily. 07/18/15  Yes Noralee Space, MD  nitroGLYCERIN (NITROSTAT) 0.4 MG SL tablet Place 0.4 mg under the tongue every 5 (five) minutes as needed. May repeat x3    Yes Historical Provider, MD  pantoprazole (PROTONIX) 40 MG tablet Take 40 mg by mouth daily.   Yes Historical Provider, MD  rosuvastatin (CRESTOR) 20 MG tablet Take 20 mg by mouth at bedtime.    Yes Historical Provider, MD  Vitamin D, Ergocalciferol, (DRISDOL) 50000 units CAPS capsule Take 1 capsule (50,000 Units total) by mouth every 7 (seven) days. 05/22/15  Yes Noralee Space, MD    Family History Family History  Problem Relation Age of Onset  . Heart disease Mother   . Rheum arthritis Mother   . Heart disease Maternal Grandmother   . Heart disease Maternal Grandfather   . Rheum arthritis Father   . Lung cancer Father   . Stroke Maternal Aunt     Social History Social History  Substance Use Topics  . Smoking status: Never Smoker  . Smokeless tobacco: Never Used  . Alcohol use No     Allergies   Patient has no known allergies.   Review of Systems Review of Systems  All other systems reviewed and are negative.    Physical Exam Updated Vital Signs BP 150/66   Pulse 60   Temp 98.4 F (36.9 C) (Oral)   Resp 20   Ht 5\' 2"  (1.575 m)   Wt 152 lb (68.9 kg)   SpO2 99%   BMI 27.80 kg/m   Physical Exam  Constitutional: She is oriented to person, place, and time. She appears well-developed.  Elderly, frail  HENT:  Head: Normocephalic and atraumatic.  Eyes: Conjunctivae and EOM are normal. Pupils are equal, round, and reactive to light.  Neck: Normal range of motion and phonation normal. Neck supple.  Cardiovascular: Normal rate and regular  rhythm.   Pulmonary/Chest: Effort normal and breath sounds normal. She exhibits no tenderness.  Abdominal: Soft. She exhibits no distension. There is no tenderness. There is no guarding.  Musculoskeletal: Normal range of motion. She exhibits no edema, tenderness or deformity.  Neurological: She is alert and oriented to person, place, and time. She exhibits normal muscle tone.  Skin: Skin is warm and dry.  Psychiatric: She has a normal mood and affect. Her behavior is normal. Judgment and thought content normal.  Nursing note and vitals reviewed.    ED Treatments / Results  Labs (all labs ordered are listed, but only abnormal results are displayed) Labs Reviewed  BASIC METABOLIC PANEL - Abnormal;  Notable for the following:       Result Value   BUN 21 (*)    Creatinine, Ser 1.44 (*)    GFR calc non Af Amer 31 (*)    GFR calc Af Amer 36 (*)    All other components within normal limits  CBC - Abnormal; Notable for the following:    RBC 5.22 (*)    MCV 74.5 (*)    MCH 25.3 (*)    RDW 15.7 (*)    Platelets 126 (*)    All other components within normal limits  I-STAT TROPOININ, ED    EKG  EKG Interpretation None       Date: 04/23/16  Rate: 80  Rhythm: normal sinus rhythm  QRS Axis: normal  PR and QT Intervals: QT prolonged  ST/T Wave abnormalities: nonspecific ST changes  PR and QRS Conduction Disutrbances:none  Narrative Interpretation:   Old EKG Reviewed: unchanged   Radiology Dg Chest 2 View  Result Date: 04/13/2016 CLINICAL DATA:  Chest pain EXAM: CHEST  2 VIEW COMPARISON:  07/21/2015 FINDINGS: Cardiomegaly. Large hiatal hernia. Right basilar atelectasis or infiltrate. Left base densities likely reflects scarring. No visible effusions or acute bony abnormality. IMPRESSION: Cardiomegaly. Right base atelectasis or infiltrate. Electronically Signed   By: Rolm Baptise M.D.   On: 04/13/2016 10:05    Procedures Procedures (including critical care time)  Medications  Ordered in ED Medications  nitroGLYCERIN 50 mg in dextrose 5 % 250 mL (0.2 mg/mL) infusion (5 mcg/min Intravenous New Bag/Given 04/13/16 1203)  0.9 %  sodium chloride infusion (50 mL/hr Intravenous New Bag/Given 04/13/16 1205)     Initial Impression / Assessment and Plan / ED Course  I have reviewed the triage vital signs and the nursing notes.  Pertinent labs & imaging results that were available during my care of the patient were reviewed by me and considered in my medical decision making (see chart for details).  Clinical Course as of Apr 13 1209  Sat Apr 13, 2016  0950 No STEMI ED EKG within 10 minutes [EW]    Clinical Course User Index [EW] Daleen Bo, MD    Medications  nitroGLYCERIN 50 mg in dextrose 5 % 250 mL (0.2 mg/mL) infusion (5 mcg/min Intravenous New Bag/Given 04/13/16 1203)  0.9 %  sodium chloride infusion (50 mL/hr Intravenous New Bag/Given 04/13/16 1205)    Patient Vitals for the past 24 hrs:  BP Temp Temp src Pulse Resp SpO2 Height Weight  04/13/16 1200 150/66 - - 60 20 99 % - -  04/13/16 1145 115/65 - - (!) 53 19 97 % - -  04/13/16 1130 107/58 - - (!) 54 20 96 % - -  04/13/16 1115 115/62 - - (!) 54 22 96 % - -  04/13/16 1100 111/59 - - (!) 53 20 96 % - -  04/13/16 1045 132/60 - - (!) 58 17 99 % - -  04/13/16 1030 122/60 - - 97 14 91 % - -  04/13/16 0930 (!) 113/50 - - 63 19 95 % - -  04/13/16 0928 - - - - - - 5\' 2"  (1.575 m) 152 lb (68.9 kg)  04/13/16 0927 119/62 98.4 F (36.9 C) Oral 76 18 96 % - -    11:35 AM Reevaluation with update and discussion. After initial assessment and treatment, an updated evaluation reveals she continues to be comfortable and is not having any chest pain. Amilyah Nack L    Case discussed with cardiology, he  will let the patient.  Issue treated with IV nitroglycerin and heparin, to stabilize.   CRITICAL CARE Performed by: Richarda Blade Total critical care time: 35 minutes Critical care time was exclusive of  separately billable procedures and treating other patients. Critical care was necessary to treat or prevent imminent or life-threatening deterioration. Critical care was time spent personally by me on the following activities: development of treatment plan with patient and/or surrogate as well as nursing, discussions with consultants, evaluation of patient's response to treatment, examination of patient, obtaining history from patient or surrogate, ordering and performing treatments and interventions, ordering and review of laboratory studies, ordering and review of radiographic studies, pulse oximetry and re-evaluation of patient's condition.    Final Clinical Impressions(s) / ED Diagnoses   Final diagnoses:  Acute coronary syndrome ([place])    Evaluation consistent with unstable angina. Patient had a pharmacologic stress test done 3 days ago, which showed progressive lateral wall defect, inducible. Pain controlled with nitroglycerin this morning. Initial troponin negative. Cardiology consultation for admission.   Nursing Notes Reviewed/ Care Coordinated, and agree without changes. Applicable Imaging Reviewed.  Interpretation of Laboratory Data incorporated into ED treatment   Plan: Admit  New Prescriptions New Prescriptions   No medications on file     Daleen Bo, MD 04/13/16 [place], MD 04/13/16 [phone]

## 2016-04-13 NOTE — Progress Notes (Signed)
ANTICOAGULATION CONSULT NOTE - Initial Consult  Pharmacy Consult for Heparin  Indication: chest pain/ACS  No Known Allergies  Patient Measurements: Height: 5\' 2"  (157.5 cm) Weight: 152 lb (68.9 kg) IBW/kg (Calculated) : 50.1   Vital Signs: Temp: 98.4 F (36.9 C) (02/10 0927) Temp Source: Oral (02/10 0927) BP: 150/66 (02/10 1200) Pulse Rate: 60 (02/10 1200)  Labs:  Recent Labs  04/13/16 1000  HGB 13.2  HCT 38.9  PLT 126*  CREATININE 1.44*    Estimated Creatinine Clearance: 24.1 mL/min (by C-G formula based on SCr of 1.44 mg/dL (H)).   Medical History: Past Medical History:  Diagnosis Date  . Anemia   . Anxiety   . Atherosclerotic heart disease   . Atrial flutter, paroxysmal ([place])   . Colon polyp   . Diverticulosis of colon   . DJD (degenerative joint disease)    "legs" (10/06/2012)  . Exertional shortness of breath   . GERD (gastroesophageal reflux disease)   . Hiatal hernia   . History of blood transfusion ? 2010  . HTN (hypertension)   . Hypercholesterolemia   . Myocardial infarction 2010  . Pneumonia    "once; years ago" (10/06/2012)  . PVD (peripheral vascular disease) ([place])   . Vitamin D deficiency       Assessment: 24yof with HX CAD and recent stress test with moderate perfusion defect admitted with USAP.  Plan to cath on Monday.  Heparin per pharmacy ordered.  CBC stable.    Goal of Therapy:  Heparin level 0.3-0.7 units/ml Monitor platelets by anticoagulation protocol: Yes   Plan:  Heparin bolus 4000 uts IV x1 Heparin drip 800 uts/hr HL in 6hr after drip started Heparin level, CBC daily  Bonnita Nasuti Pharm.D. CPP, BCPS Clinical Pharmacist [phone] 04/13/2016 12:25 PM

## 2016-04-14 LAB — CBC
HEMATOCRIT: 35.9 % — AB (ref 36.0–46.0)
HEMOGLOBIN: 12.2 g/dL (ref 12.0–15.0)
MCH: 25.2 pg — ABNORMAL LOW (ref 26.0–34.0)
MCHC: 34 g/dL (ref 30.0–36.0)
MCV: 74 fL — ABNORMAL LOW (ref 78.0–100.0)
Platelets: 120 10*3/uL — ABNORMAL LOW (ref 150–400)
RBC: 4.85 MIL/uL (ref 3.87–5.11)
RDW: 15.8 % — ABNORMAL HIGH (ref 11.5–15.5)
WBC: 7.2 10*3/uL (ref 4.0–10.5)

## 2016-04-14 LAB — BASIC METABOLIC PANEL
Anion gap: 7 (ref 5–15)
BUN: 18 mg/dL (ref 6–20)
CALCIUM: 8.6 mg/dL — AB (ref 8.9–10.3)
CO2: 23 mmol/L (ref 22–32)
CREATININE: 1.36 mg/dL — AB (ref 0.44–1.00)
Chloride: 109 mmol/L (ref 101–111)
GFR calc Af Amer: 39 mL/min — ABNORMAL LOW (ref >60)
GFR calc non Af Amer: 33 mL/min — ABNORMAL LOW (ref >60)
GLUCOSE: 91 mg/dL (ref 65–99)
Potassium: 4 mmol/L (ref 3.5–5.1)
Sodium: 139 mmol/L (ref 135–145)

## 2016-04-14 LAB — LIPID PANEL
Cholesterol: 125 mg/dL (ref 0–200)
HDL: 65 mg/dL (ref >40)
LDL CALC: 55 mg/dL (ref 0–99)
Total CHOL/HDL Ratio: 1.9 RATIO
Triglycerides: 24 mg/dL (ref <150)
VLDL: 5 mg/dL (ref 0–40)

## 2016-04-14 LAB — HEPARIN LEVEL (UNFRACTIONATED): Heparin Unfractionated: 0.64 IU/mL (ref 0.30–0.70)

## 2016-04-14 LAB — TROPONIN I: Troponin I: <0.03 ng/mL (ref <0.03)

## 2016-04-14 IMAGING — CR DG KNEE COMPLETE 4+V*L*
4 series · 4 of 4 positions shown · non-contrast
Comparison: None.

CLINICAL DATA: Status post slip and fall in the bathroom today with
a left knee injury. Initial encounter.

EXAM:
LEFT KNEE - COMPLETE 4+ VIEW

[knee ap]
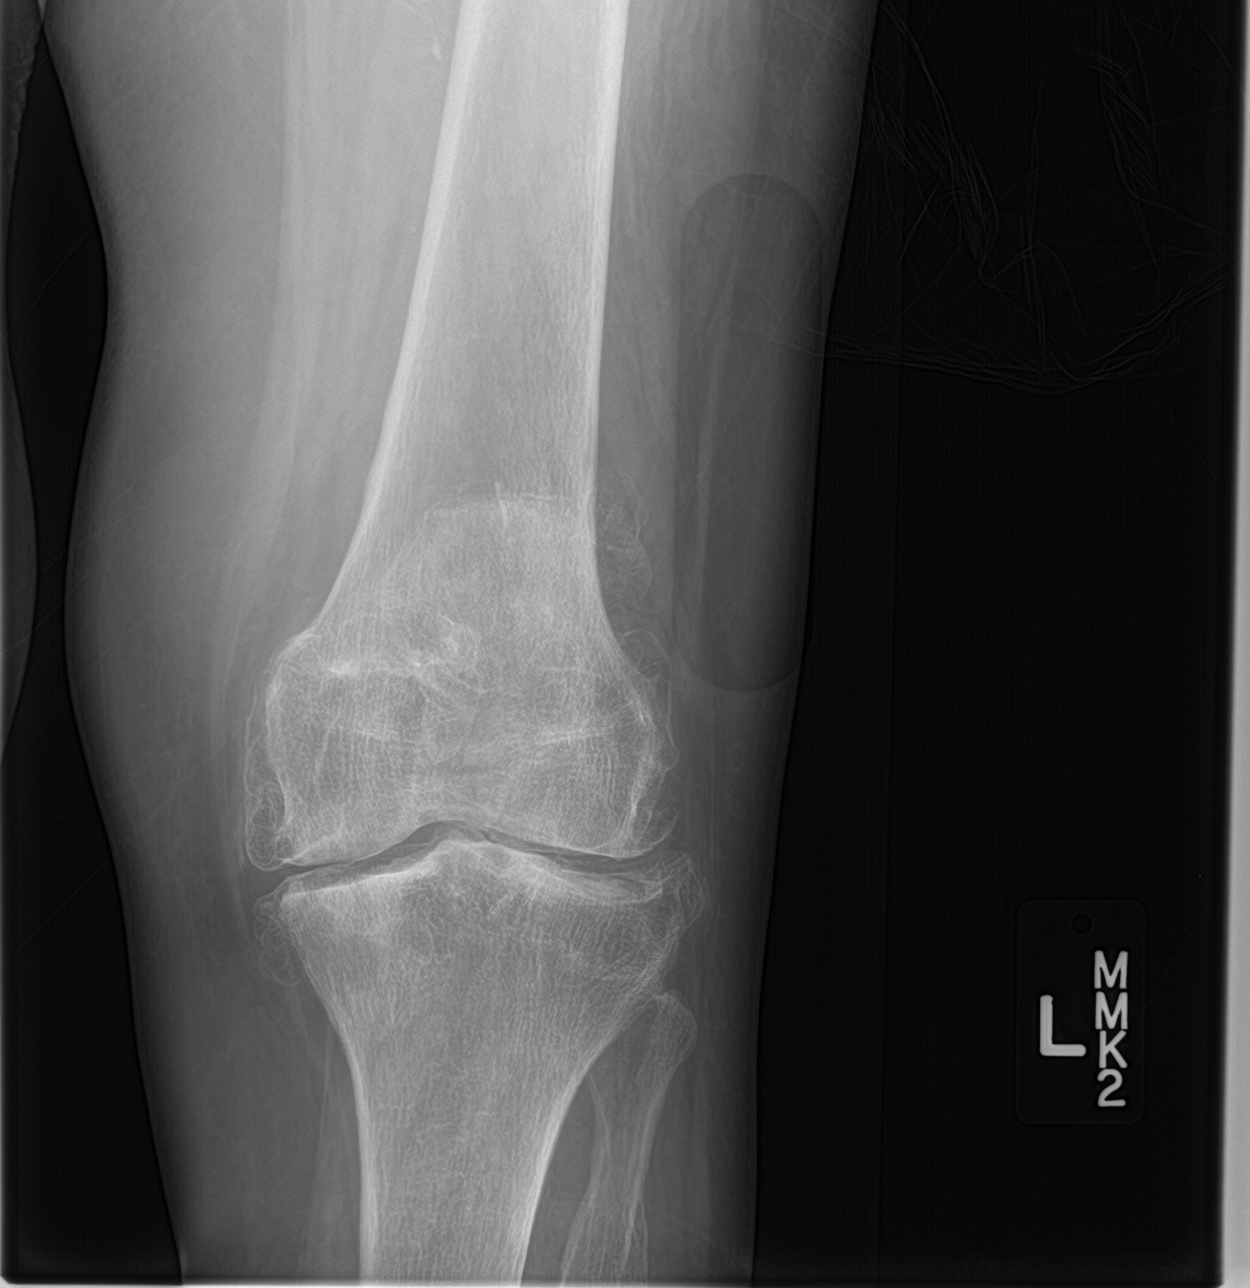

[knee lat]
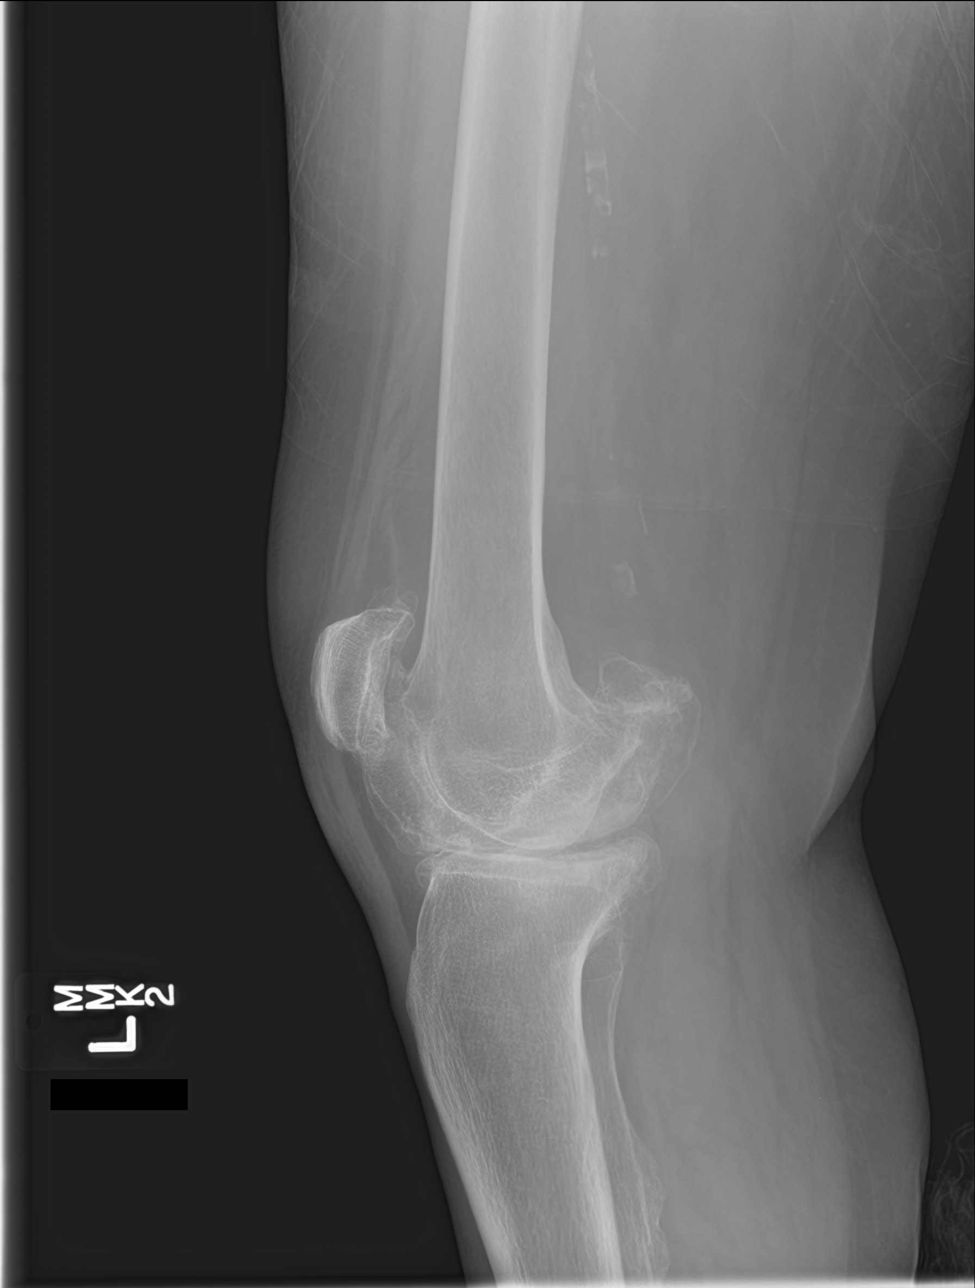

[knee obl (1 of 2)]
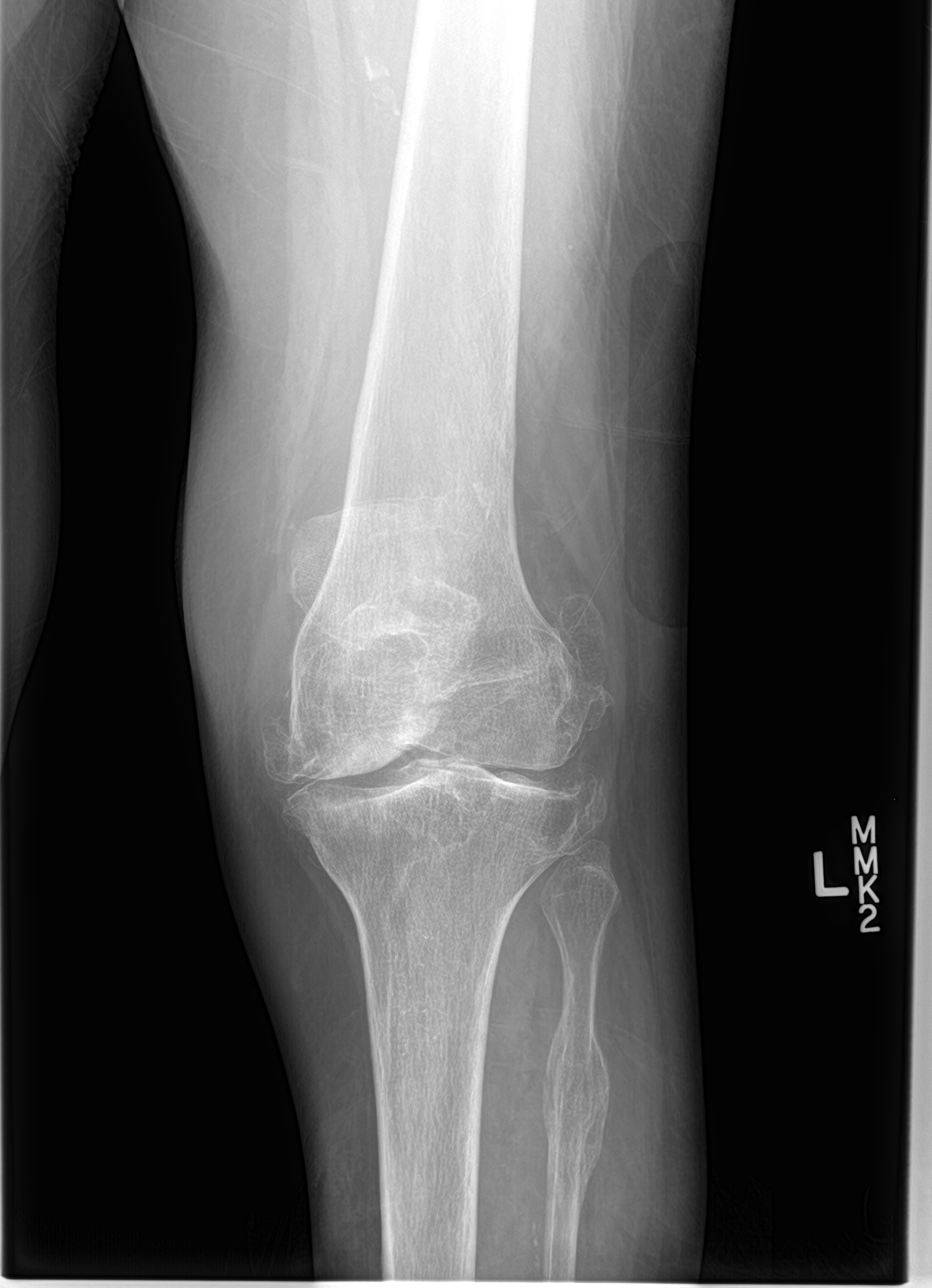

[knee obl (2 of 2)]
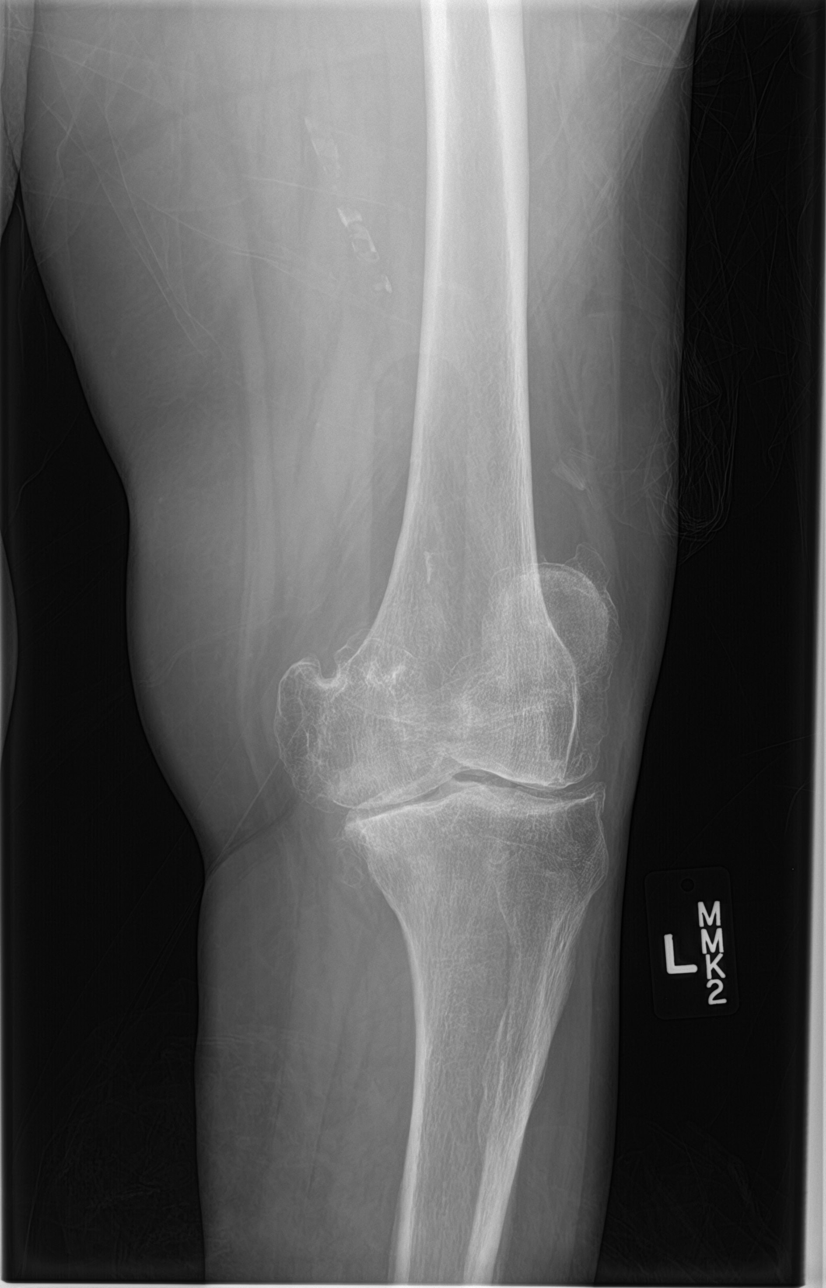

[4 of 4 positions shown; findings below may reference images not displayed]

FINDINGS: There is no acute bony or joint abnormality. No joint effusion is
identified. The patient has severe tricompartmental osteoarthritis
about the knee. Remote healed fracture of the proximal diaphysis of
the fibula is noted. Atherosclerosis is identified.
IMPRESSION: No acute abnormality.

Severe tricompartmental osteoarthritis.

Remote healed proximal fibular fracture.

## 2016-04-14 IMAGING — CR DG LUMBAR SPINE COMPLETE 4+V
5 series · 5 of 5 positions shown · non-contrast
Comparison: None.

CLINICAL DATA: Slip and fall in shower with pain, initial encounter

EXAM:
LUMBAR SPINE - COMPLETE 4+ VIEW

[l-spine ap]
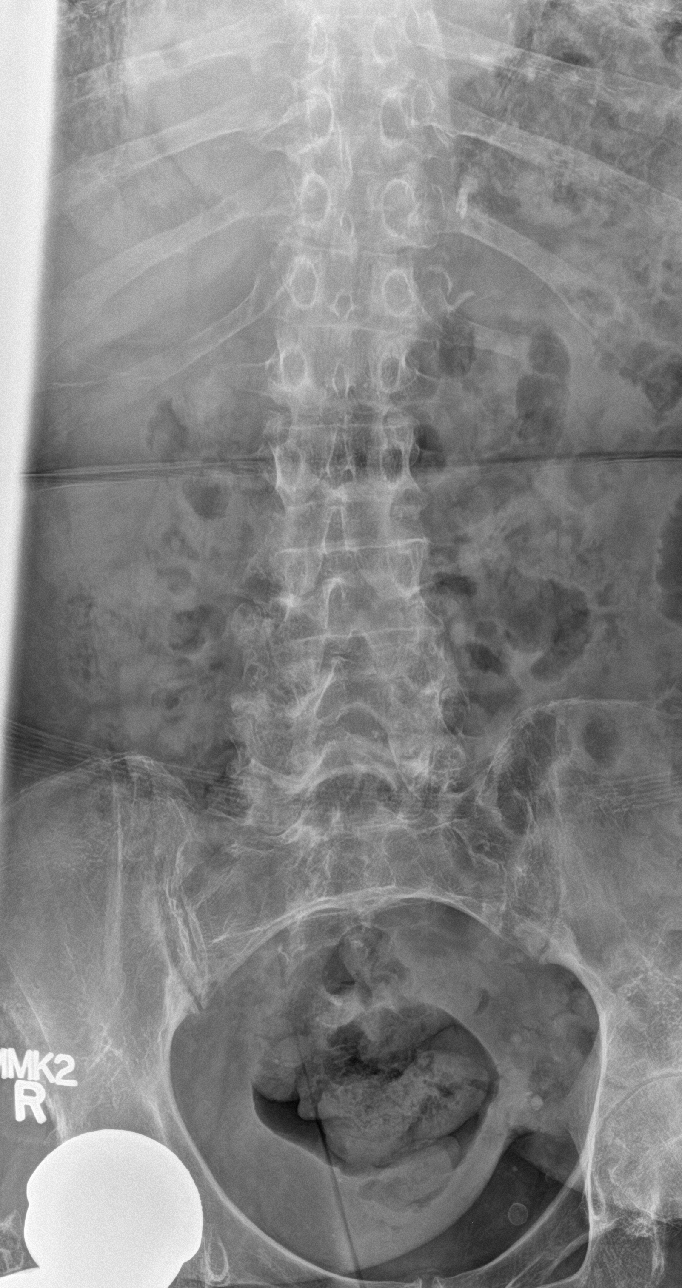

[l-spine obl (1 of 2)]
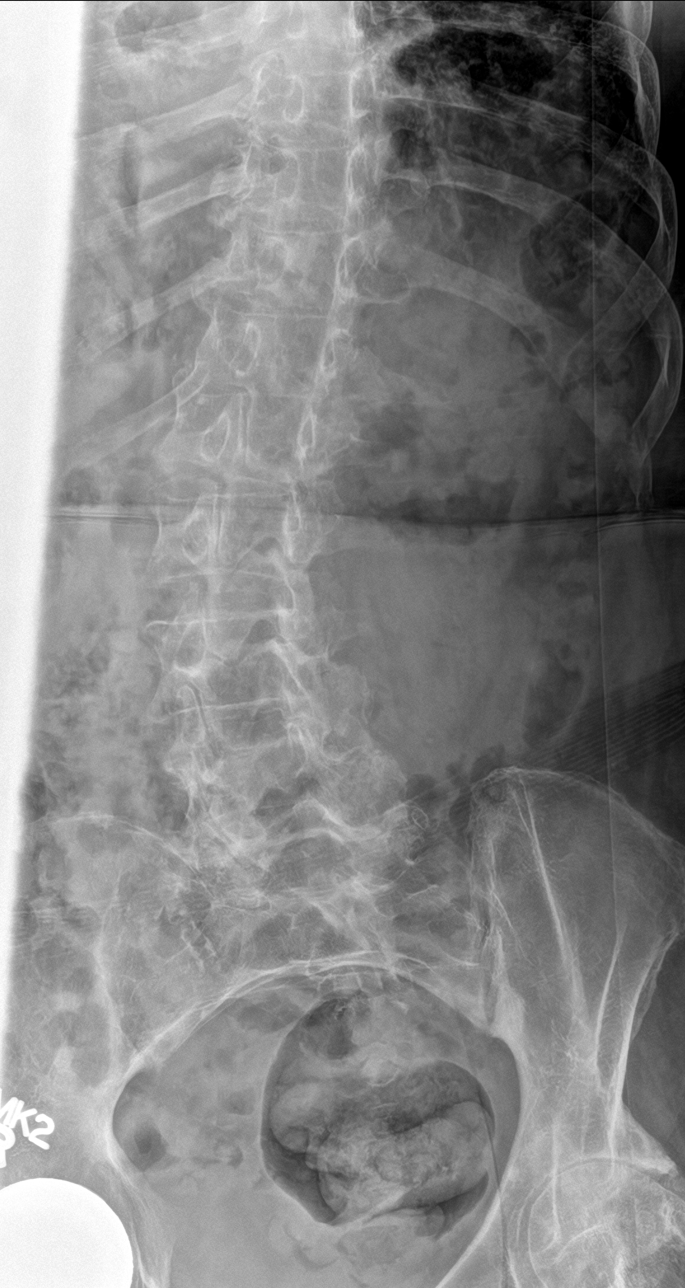

[l-spine obl (2 of 2)]
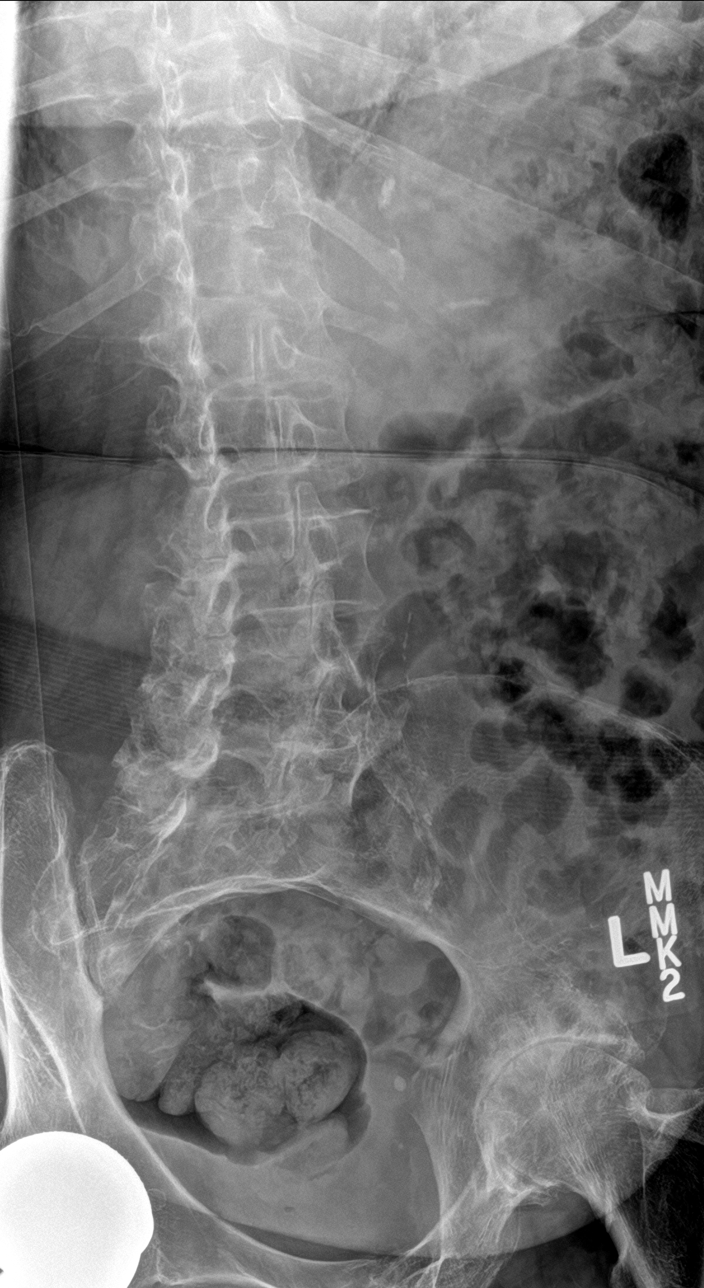

[l-spine lat]
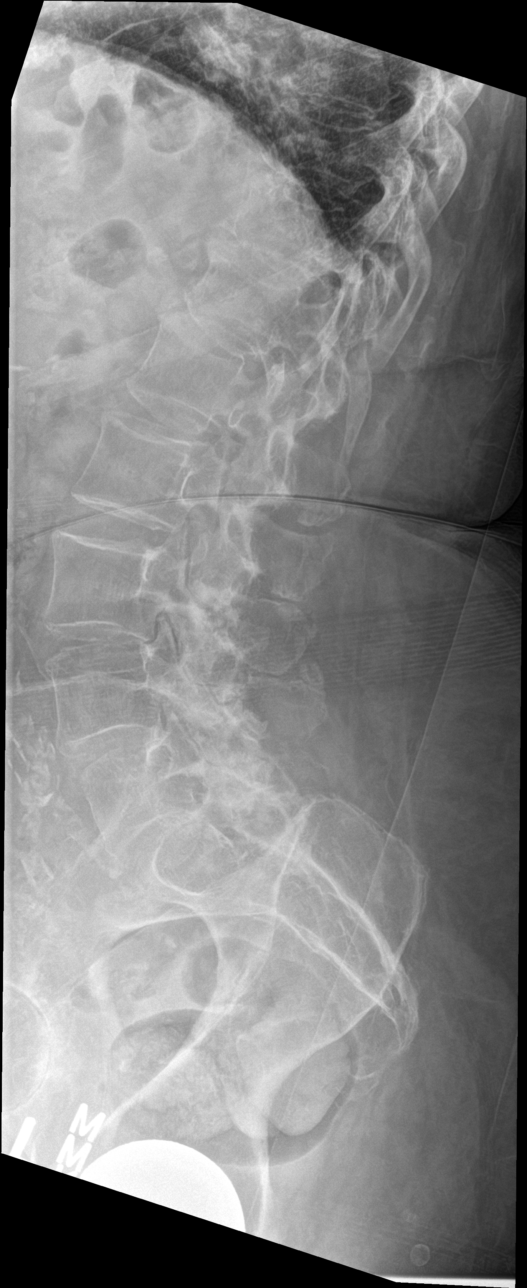

[l-spine spot]
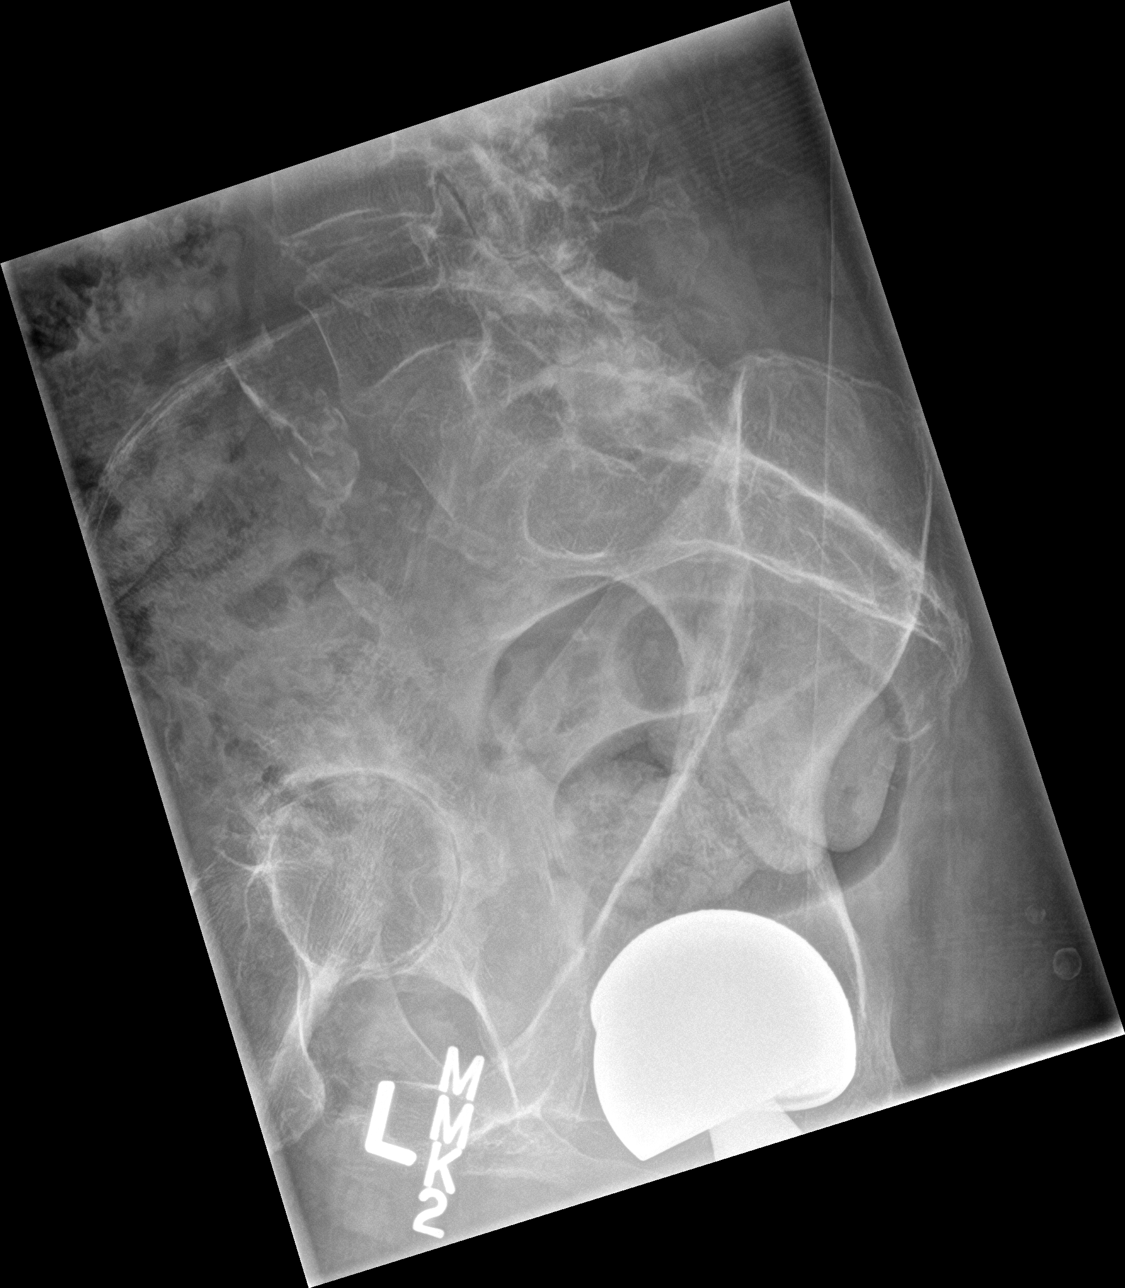

[5 of 5 positions shown; findings below may reference images not displayed]

FINDINGS: Right hip replacement is noted. Five lumbar type vertebral bodies
are well visualized. Vertebral body height is well maintained. Facet
hypertrophic changes are noted. No anterolisthesis is seen.
IMPRESSION: Degenerative change without acute abnormality.

## 2016-04-14 MED ORDER — SODIUM CHLORIDE 0.9 % IV SOLN: 250.0000 mL | INTRAVENOUS | Status: DC | PRN

## 2016-04-14 MED ORDER — AMIODARONE HCL 200 MG PO TABS
100.0000 mg | ORAL_TABLET | Freq: Every day | ORAL | Status: DC
  Filled 2016-04-14: qty 1

## 2016-04-14 MED ORDER — SODIUM CHLORIDE 0.9% FLUSH
3.0000 mL | Freq: Two times a day (BID) | INTRAVENOUS | Status: DC
  Administered 2016-04-14: 3 mL via INTRAVENOUS

## 2016-04-14 MED ORDER — SODIUM CHLORIDE 0.9 % WEIGHT BASED INFUSION
1.0000 mL/kg/h | INTRAVENOUS | Status: DC
  Administered 2016-04-14 – 2016-04-15 (×2): 1 mL/kg/h via INTRAVENOUS

## 2016-04-14 MED ORDER — SODIUM CHLORIDE 0.9% FLUSH: 3.0000 mL | INTRAVENOUS | Status: DC | PRN

## 2016-04-14 NOTE — Progress Notes (Signed)
[place] for Heparin  Indication: chest pain/ACS  No Known Allergies  Patient Measurements: Height: 5\' 2"  (157.5 cm) Weight: 152 lb (68.9 kg) IBW/kg (Calculated) : 50.1   Vital Signs: Temp: 98.3 F (36.8 C) (02/11 0423) Temp Source: Oral (02/11 0423) BP: 107/54 (02/11 0423) Pulse Rate: 50 (02/11 0423)  Labs:  Recent Labs  04/13/16 1000 04/13/16 1331 04/13/16 1754 04/14/16 0000 04/14/16 0346  HGB 13.2 12.7  --   --  12.2  HCT 38.9 37.6  --   --  35.9*  PLT 126* 126*  --   --  120*  HEPARINUNFRC  --   --  0.49  --  0.64  CREATININE 1.44* 1.31*  --   --  1.36*  TROPONINI  --  <0.03 <0.03 <0.03  --     Estimated Creatinine Clearance: 25.5 mL/min (by C-G formula based on SCr of 1.36 mg/dL (H)).   Medical History: Past Medical History:  Diagnosis Date  . Anemia   . Anxiety   . Atherosclerotic heart disease   . Atrial flutter, paroxysmal ([place])   . Colon polyp   . Diverticulosis of colon   . DJD (degenerative joint disease)    "legs" (10/06/2012)  . Exertional shortness of breath   . GERD (gastroesophageal reflux disease)   . Hiatal hernia   . History of blood transfusion ? 2010  . HTN (hypertension)   . Hypercholesterolemia   . Myocardial infarction 2010  . Pneumonia    "once; years ago" (10/06/2012)  . PVD (peripheral vascular disease) ([place])   . Vitamin D deficiency       Assessment: 23yof with HX CAD and recent stress test with moderate perfusion defect admitted with USAP.  Plan to cath on Monday.  Heparin per pharmacy ordered. Hg wnl, plt down 120. Noted hx of afib and GIB in past.  Heparin level is therapeutic at 0.64 on heparin 800 units/hr. No issues with infusion or bleeding reported.  Goal of Therapy:  Heparin level 0.3-0.7 units/ml Monitor platelets by anticoagulation protocol: Yes   Plan:  Heparin at 800 units/hr Heparin level, CBC daily Monitor for s/sx bleeding Cath scheduled 2/12  Elicia Lamp,  PharmD, BCPS Clinical Pharmacist 04/14/2016 10:21 AM

## 2016-04-14 NOTE — Progress Notes (Signed)
Subjective:  Patient denies any further chest pain.  Denies any shortness of breath.  Noted to have marked sinus bradycardia on the monitor  Objective:  Vital Signs in the last 24 hours: Temp:  [98.1 F (36.7 C)-98.3 F (36.8 C)] 98.3 F (36.8 C) (02/11 0423) Pulse Rate:  [50-60] 50 (02/11 0423) Resp:  [12-23] 16 (02/11 0423) BP: (99-150)/(49-73) 107/54 (02/11 0423) SpO2:  [94 %-100 %] 97 % (02/11 0423)  Intake/Output from previous day: 02/10 0701 - 02/11 0700 In: 100.8 [I.V.:100.8] Out: -  Intake/Output from this shift: No intake/output data recorded.  Physical Exam: Neck: no adenopathy, no carotid bruit, no JVD and supple, symmetrical, trachea midline Lungs: clear to auscultation bilaterally Heart: bradycardic, S1, S2.  Soft systolic murmur noted Abdomen: soft, non-tender; bowel sounds normal; no masses,  no organomegaly Extremities: extremities normal, atraumatic, no cyanosis or edema  Lab Results:  Recent Labs  04/13/16 1331 04/14/16 0346  WBC 6.6 7.2  HGB 12.7 12.2  PLT 126* 120*    Recent Labs  04/13/16 1331 04/14/16 0346  NA 140 139  K 4.0 4.0  CL 111 109  CO2 23 23  GLUCOSE 92 91  BUN 19 18  CREATININE 1.31* 1.36*    Recent Labs  04/13/16 1754 04/14/16 0000  TROPONINI <0.03 <0.03   Hepatic Function Panel No results for input(s): PROT, ALBUMIN, AST, ALT, ALKPHOS, BILITOT, BILIDIR, IBILI in the last 72 hours.  Recent Labs  04/14/16 0346  CHOL 125   No results for input(s): PROTIME in the last 72 hours.  Imaging: Imaging results have been reviewed and Dg Chest 2 View  Result Date: 04/13/2016 CLINICAL DATA:  Chest pain EXAM: CHEST  2 VIEW COMPARISON:  07/21/2015 FINDINGS: Cardiomegaly. Large hiatal hernia. Right basilar atelectasis or infiltrate. Left base densities likely reflects scarring. No visible effusions or acute bony abnormality. IMPRESSION: Cardiomegaly. Right base atelectasis or infiltrate. Electronically Signed   By: Rolm Baptise  M.D.   On: 04/13/2016 10:05    Cardiac Studies:  Assessment/Plan:  Unstable angina with positive nuclear stress test recently MI ruled out Rule out progression of CAD Coronary artery disease history of non-Q-wave MI in the past status post PCI to mid LAD in the past Hypertension Hyperlipidemia History of paroxysmal atrial fibrillation in the past History of GI bleed in the past Peripheral vascular disease GERD Degenerative joint disease Anemia of chronic disease Marked sinus bradycardia secondary to medications. Plan Hold beta blockers for now. Discussed with patient regarding left cardiac catheterization, possible PTCA stenting.  t's risk and benefits, I.e., death, MI, stroke, need for emergency CABG, local vascular complications, etc., and consents for PCI.  LOS: 1 day    Virginia Casey 04/14/2016, 11:13 AM

## 2016-04-14 NOTE — Progress Notes (Signed)
Spoke with Dr. Terrence Dupont concerning bradycardia in 50's. Per MD discontinued metoprolol and amiodarone. Will continue nitroglycerin drip, heparin and normal saline per MD.  Scheduled for cardiac cath at 0730 04/15/16. Payton Emerald, RN

## 2016-04-15 ENCOUNTER — Encounter (HOSPITAL_COMMUNITY)
Admission: EM | Disposition: A | Discharge status: Home / Self Care | Payer: Self-pay | Source: Home / Self Care | Attending: Cardiology

## 2016-04-15 ENCOUNTER — Other Ambulatory Visit: Payer: Self-pay

## 2016-04-15 ENCOUNTER — Encounter (HOSPITAL_COMMUNITY): Payer: Self-pay | Admitting: Cardiology

## 2016-04-15 ENCOUNTER — Encounter (HOSPITAL_COMMUNITY): Payer: Self-pay

## 2016-04-15 HISTORY — PX: LEFT HEART CATH AND CORONARY ANGIOGRAPHY: CATH118249

## 2016-04-15 LAB — HEPARIN LEVEL (UNFRACTIONATED): HEPARIN UNFRACTIONATED: 0.6 [IU]/mL (ref 0.30–0.70)

## 2016-04-15 LAB — BASIC METABOLIC PANEL
ANION GAP: 7 (ref 5–15)
BUN: 18 mg/dL (ref 6–20)
CALCIUM: 8.4 mg/dL — AB (ref 8.9–10.3)
CO2: 23 mmol/L (ref 22–32)
Chloride: 111 mmol/L (ref 101–111)
Creatinine, Ser: 1.35 mg/dL — ABNORMAL HIGH (ref 0.44–1.00)
GFR, EST AFRICAN AMERICAN: 39 mL/min — AB (ref >60)
GFR, EST NON AFRICAN AMERICAN: 34 mL/min — AB (ref >60)
GLUCOSE: 109 mg/dL — AB (ref 65–99)
POTASSIUM: 4.4 mmol/L (ref 3.5–5.1)
SODIUM: 141 mmol/L (ref 135–145)

## 2016-04-15 LAB — CBC
HCT: 33.5 % — ABNORMAL LOW (ref 36.0–46.0)
Hemoglobin: 11.2 g/dL — ABNORMAL LOW (ref 12.0–15.0)
MCH: 24.9 pg — ABNORMAL LOW (ref 26.0–34.0)
MCHC: 33.4 g/dL (ref 30.0–36.0)
MCV: 74.6 fL — ABNORMAL LOW (ref 78.0–100.0)
PLATELETS: 120 10*3/uL — AB (ref 150–400)
RBC: 4.49 MIL/uL (ref 3.87–5.11)
RDW: 15.9 % — AB (ref 11.5–15.5)
WBC: 6.8 10*3/uL (ref 4.0–10.5)

## 2016-04-15 LAB — PROTIME-INR
INR: 1.15
PROTHROMBIN TIME: 14.8 s (ref 11.4–15.2)

## 2016-04-15 LAB — POCT ACTIVATED CLOTTING TIME: ACTIVATED CLOTTING TIME: 153 s

## 2016-04-15 SURGERY — LEFT HEART CATH AND CORONARY ANGIOGRAPHY
Anesthesia: LOCAL

## 2016-04-15 MED ORDER — IOPAMIDOL (ISOVUE-370) INJECTION 76%
INTRAVENOUS | Status: AC
Start: 2016-04-15 — End: 2016-04-15
  Filled 2016-04-15: qty 100

## 2016-04-15 MED ORDER — HEPARIN (PORCINE) IN NACL 2-0.9 UNIT/ML-% IJ SOLN
INTRAMUSCULAR | Status: DC | PRN
  Administered 2016-04-15: 1500 mL

## 2016-04-15 MED ORDER — HEPARIN (PORCINE) IN NACL 2-0.9 UNIT/ML-% IJ SOLN
INTRAMUSCULAR | Status: AC
  Filled 2016-04-15: qty 1500

## 2016-04-15 MED ORDER — LIDOCAINE HCL (PF) 1 % IJ SOLN
INTRAMUSCULAR | Status: DC | PRN
  Administered 2016-04-15: 20 mL

## 2016-04-15 MED ORDER — IOPAMIDOL (ISOVUE-370) INJECTION 76%
INTRAVENOUS | Status: DC | PRN
  Administered 2016-04-15: 65 mL via INTRA_ARTERIAL

## 2016-04-15 MED ORDER — SODIUM CHLORIDE 0.9 % IV SOLN: INTRAVENOUS | Status: AC

## 2016-04-15 MED ORDER — SODIUM CHLORIDE 0.9 % IV SOLN: 250.0000 mL | INTRAVENOUS | Status: DC | PRN

## 2016-04-15 MED ORDER — SODIUM CHLORIDE 0.9% FLUSH
3.0000 mL | Freq: Two times a day (BID) | INTRAVENOUS | Status: DC
  Administered 2016-04-15: 3 mL via INTRAVENOUS

## 2016-04-15 MED ORDER — MIDAZOLAM HCL 2 MG/2ML IJ SOLN
INTRAMUSCULAR | Status: DC | PRN
  Administered 2016-04-15: 1 mg via INTRAVENOUS

## 2016-04-15 MED ORDER — SODIUM CHLORIDE 0.9% FLUSH: 3.0000 mL | INTRAVENOUS | Status: DC | PRN

## 2016-04-15 MED ORDER — MIDAZOLAM HCL 2 MG/2ML IJ SOLN
INTRAMUSCULAR | Status: AC
  Filled 2016-04-15: qty 2

## 2016-04-15 MED ORDER — LIDOCAINE HCL (PF) 1 % IJ SOLN
INTRAMUSCULAR | Status: AC
  Filled 2016-04-15: qty 30

## 2016-04-15 SURGICAL SUPPLY — 7 items
CATH INFINITI 5FR MULTPACK ANG (CATHETERS) ×1 IMPLANT
KIT HEART LEFT (KITS) ×2 IMPLANT
PACK CARDIAC CATHETERIZATION (CUSTOM PROCEDURE TRAY) ×2 IMPLANT
SHEATH PINNACLE 5F 10CM (SHEATH) ×1 IMPLANT
SYR MEDRAD MARK V 150ML (SYRINGE) ×2 IMPLANT
TRANSDUCER W/STOPCOCK (MISCELLANEOUS) ×2 IMPLANT
WIRE EMERALD 3MM-J .035X150CM (WIRE) ×1 IMPLANT

## 2016-04-15 NOTE — H&P (View-Only) (Signed)
Subjective:  Patient denies any further chest pain.  Denies any shortness of breath.  Noted to have marked sinus bradycardia on the monitor  Objective:  Vital Signs in the last 24 hours: Temp:  [98.1 F (36.7 C)-98.3 F (36.8 C)] 98.3 F (36.8 C) (02/11 0423) Pulse Rate:  [50-60] 50 (02/11 0423) Resp:  [12-23] 16 (02/11 0423) BP: (99-150)/(49-73) 107/54 (02/11 0423) SpO2:  [94 %-100 %] 97 % (02/11 0423)  Intake/Output from previous day: 02/10 0701 - 02/11 0700 In: 100.8 [I.V.:100.8] Out: -  Intake/Output from this shift: No intake/output data recorded.  Physical Exam: Neck: no adenopathy, no carotid bruit, no JVD and supple, symmetrical, trachea midline Lungs: clear to auscultation bilaterally Heart: bradycardic, S1, S2.  Soft systolic murmur noted Abdomen: soft, non-tender; bowel sounds normal; no masses,  no organomegaly Extremities: extremities normal, atraumatic, no cyanosis or edema  Lab Results:  Recent Labs  04/13/16 1331 04/14/16 0346  WBC 6.6 7.2  HGB 12.7 12.2  PLT 126* 120*    Recent Labs  04/13/16 1331 04/14/16 0346  NA 140 139  K 4.0 4.0  CL 111 109  CO2 23 23  GLUCOSE 92 91  BUN 19 18  CREATININE 1.31* 1.36*    Recent Labs  04/13/16 1754 04/14/16 0000  TROPONINI <0.03 <0.03   Hepatic Function Panel No results for input(s): PROT, ALBUMIN, AST, ALT, ALKPHOS, BILITOT, BILIDIR, IBILI in the last 72 hours.  Recent Labs  04/14/16 0346  CHOL 125   No results for input(s): PROTIME in the last 72 hours.  Imaging: Imaging results have been reviewed and Dg Chest 2 View  Result Date: 04/13/2016 CLINICAL DATA:  Chest pain EXAM: CHEST  2 VIEW COMPARISON:  07/21/2015 FINDINGS: Cardiomegaly. Large hiatal hernia. Right basilar atelectasis or infiltrate. Left base densities likely reflects scarring. No visible effusions or acute bony abnormality. IMPRESSION: Cardiomegaly. Right base atelectasis or infiltrate. Electronically Signed   By: Rolm Baptise  M.D.   On: 04/13/2016 10:05    Cardiac Studies:  Assessment/Plan:  Unstable angina with positive nuclear stress test recently MI ruled out Rule out progression of CAD Coronary artery disease history of non-Q-wave MI in the past status post PCI to mid LAD in the past Hypertension Hyperlipidemia History of paroxysmal atrial fibrillation in the past History of GI bleed in the past Peripheral vascular disease GERD Degenerative joint disease Anemia of chronic disease Marked sinus bradycardia secondary to medications. Plan Hold beta blockers for now. Discussed with patient regarding left cardiac catheterization, possible PTCA stenting.  t's risk and benefits, I.e., death, MI, stroke, need for emergency CABG, local vascular complications, etc., and consents for PCI.  LOS: 1 day    Virginia Casey 04/14/2016, 11:13 AM

## 2016-04-15 NOTE — Interval H&P Note (Signed)
Cath Lab Visit (complete for each Cath Lab visit)  Clinical Evaluation Leading to the Procedure:   ACS: No.  Non-ACS:    Anginal Classification: CCS IV  Anti-ischemic medical therapy: Maximal Therapy (2 or more classes of medications)  Non-Invasive Test Results: Intermediate-risk stress test findings: cardiac mortality 1-3%/year  Prior CABG: No previous CABG      History and Physical Interval Note:  04/15/2016 7:26 AM  [name]  has presented today for surgery, with the diagnosis of unstable angina  The various methods of treatment have been discussed with the patient and family. After consideration of risks, benefits and other options for treatment, the patient has consented to  Procedure(s): Left Heart Cath and Coronary Angiography (N/A) as a surgical intervention .  The patient's history has been reviewed, patient examined, no change in status, stable for surgery.  I have reviewed the patient's chart and labs.  Questions were answered to the patient's satisfaction.     Charolette Forward

## 2016-04-15 NOTE — Discharge Summary (Signed)
ischarge summary dictated on 04/15/2016, dictation number is [phone]

## 2016-04-15 NOTE — Progress Notes (Signed)
Site area: Right groin a 5 french arterial sheath was removed  Site Prior to Removal:  Level 0  Pressure Applied For 15 MINUTES    Bedrest Beginning at 0900am  Manual:   Yes.    Patient Status During Pull:  Stable  Post Pull Groin Site:  Level 0  Post Pull Instructions Given:  Yes.    Post Pull Pulses Present:  Yes.    Dressing Applied:  Yes.    Comments:  VS remain stable during sheath pull.

## 2016-04-15 NOTE — Progress Notes (Signed)
Pt. Discharged to home with family Pt. D/C'd via wheelchair with RN  heather Discharge information reviewed and given All personal belongings given to Pt.  Education discussed with teach back IV was d/c Tele d/c

## 2016-04-15 NOTE — Discharge Instructions (Signed)
Acute Coronary Syndrome Acute coronary syndrome (ACS) is a serious problem in which there is suddenly not enough blood and oxygen supplied to the heart. ACS may mean that one or more of the blood vessels in your heart (coronary arteries) may be blocked. ACS can result in chest pain or a heart attack (myocardial infarction or MI). What are the causes? This condition is caused by atherosclerosis, which is the buildup of fat and cholesterol (plaque) on the inside of the arteries. Over time, the plaque may narrow or block the artery, and this will lessen blood flow to the heart. Plaque can also become weak and break off within a coronary artery to form a clot and cause a sudden blockage. What increases the risk? The risk factors of this condition include:  High cholesterol levels.  High blood pressure (hypertension).  Smoking.  Diabetes.  Age.  Family history of chest pain, heart disease, or stroke.  Lack of exercise. What are the signs or symptoms? The most common signs of this condition include:  Chest pain, which can be:  A crushing or squeezing in the chest.  A tightness, pressure, fullness, or heaviness in the chest.  Present for more than a few minutes, or it can stop and recur.  Pain in the arms, neck, jaw, or back.  Unexplained heartburn or indigestion.  Shortness of breath.  Nausea.  Sudden cold sweats.  Feeling light-headed or dizzy. Sometimes, this condition has no symptoms. How is this diagnosed? ACS may be diagnosed through the following tests:  Electrocardiogram (ECG).  Blood tests.  Coronary angiogram. This is a procedure to look at the coronary arteries to see if there is any blockage. How is this treated? Treatment for ACS may include:  Healthy behavioral changes to reduce or control risk factors.  Medicine.  Coronary stenting.A stent helps to keep an artery open.  Coronary angioplasty. This procedure widens a narrowed or blocked  artery.  Coronary artery bypass surgery. This will allow your blood to pass the blockage (bypass) to reach your heart. Follow these instructions at home: Eating and drinking  Follow a heart-healthy diet. A dietitian can you help to educate you about healthy food options and changes.  Use healthy cooking methods such as roasting, grilling, broiling, baking, poaching, steaming, or stir-frying. Talk to a dietitian to learn more about healthy cooking methods. Medicines  Take medicines only as directed by your health care provider.  Do not take the following medicines unless your health care provider approves:  Nonsteroidal anti-inflammatory drugs (NSAIDs), such as ibuprofen, naproxen, or celecoxib.  Vitamin supplements that contain vitamin A, vitamin E, or both.  Hormone replacement therapy that contains estrogen with or without progestin.  Stop illegal drug use. Activity  Follow an exercise program that is approved by your health care provider.  Plan rest periods when you are fatigued. Lifestyle  Do not use any tobacco products, including cigarettes, chewing tobacco, or electronic cigarettes. If you need help quitting, ask your health care provider.  If you drink alcohol, and your health care provider approves, limit your alcohol intake to no more than 1 drink per day. One drink equals 12 ounces of beer, 5 ounces of wine, or 1 ounces of hard liquor.  Learn to manage stress.  Maintain a healthy weight. Lose weight as approved by your health care provider. General instructions  Manage other health conditions, such as hypertension and diabetes, as directed by your health care provider.  Keep all follow-up visits as directed by  your health care provider. This is important.  Your health care provider may ask you to monitor your blood pressure. A blood pressure reading consists of a higher number over a lower number, such as 110 over 72, written as 110/72. Ideally, your blood  pressure should be:  Below 140/90 if you have no other medical conditions.  Below 130/80 if you have diabetes or kidney disease. Get help right away if:  You have pain in your chest, neck, arm, jaw, stomach, or back that lasts more than a few minutes, is recurring, or is not relieved by taking medicine under your tongue (sublingual nitroglycerin).  You have profuse sweating without cause.  You have unexplained:  Heartburn or indigestion.  Shortness of breath or difficulty breathing.  Nausea or vomiting.  Fatigue.  Feelings of nervousness or anxiety.  Weakness.  Diarrhea.  You have sudden light-headedness or dizziness.  You faint. These symptoms may represent a serious problem that is an emergency. Do not wait to see if the symptoms will go away. Get medical help right away. Call your local emergency services (911 in the [place] Do not drive yourself to the clinic or hospital.  This information is not intended to replace advice given to you by your health care provider. Make sure you discuss any questions you have with your health care provider. Document Released: 02/18/2005 Document Revised: 08/02/2015 Document Reviewed: 06/22/2013 Elsevier Interactive Patient Education  2017 [employer]. Coronary Angiogram A coronary angiogram is an X-ray procedure that is used to examine the arteries in the heart. In this procedure, a dye (contrast dye) is injected through a long, thin tube (catheter). The catheter is inserted through the groin, wrist, or arm. The dye is injected into each artery, then X-rays are taken to show if there is a blockage in the arteries of the heart. This procedure can also show if you have valve disease or a disease of the aorta, and it can be used to check the overall function of your heart muscle. You may have a coronary angiogram if:  You are having chest pain, or other symptoms of angina, and you are at risk for heart disease.  You have an abnormal  electrocardiogram (ECG) or stress test.  You have chest pain and heart failure.  You are having irregular heart rhythms.  You and your health care provider determine that the benefits of the test information outweigh the risks of the procedure. Let your health care provider know about:  Any allergies you have, including allergies to contrast dye.  All medicines you are taking, including vitamins, herbs, eye drops, creams, and over-the-counter medicines.  Any problems you or family members have had with anesthetic medicines.  Any blood disorders you have.  Any surgeries you have had.  History of kidney problems or kidney failure.  Any medical conditions you have.  Whether you are pregnant or may be pregnant. What are the risks? Generally, this is a safe procedure. However, problems may occur, including:  Infection.  Allergic reaction to medicines or dyes that are used.  Bleeding from the access site or other locations.  Kidney injury, especially in people with impaired kidney function.  Stroke (rare).  Heart attack (rare).  Damage to other structures or organs. What happens before the procedure? Staying hydrated  Follow instructions from your health care provider about hydration, which may include:  Up to 2 hours before the procedure - you may continue to drink clear liquids, such as water, clear fruit juice, black  coffee, and plain tea. Eating and drinking restrictions  Follow instructions from your health care provider about eating and drinking, which may include:  8 hours before the procedure - stop eating heavy meals or foods such as meat, fried foods, or fatty foods.  6 hours before the procedure - stop eating light meals or foods, such as toast or cereal.  2 hours before the procedure - stop drinking clear liquids. General instructions  Ask your health care provider about:  Changing or stopping your regular medicines. This is especially important if you  are taking diabetes medicines or blood thinners.  Taking medicines such as ibuprofen. These medicines can thin your blood. Do not take these medicines before your procedure if your health care provider instructs you not to, though aspirin may be recommended prior to coronary angiograms.  Plan to have someone take you home from the hospital or clinic.  You may need to have blood tests or X-rays done. What happens during the procedure?  An IV tube will be inserted into one of your veins.  You will be given one or more of the following:  A medicine to help you relax (sedative).  A medicine to numb the area where the catheter will be inserted into an artery (local anesthetic).  To reduce your risk of infection:  Your health care team will wash or sanitize their hands.  Your skin will be washed with soap.  Hair may be removed from the area where the catheter will be inserted.  You will be connected to a continuous ECG monitor.  The catheter will be inserted into an artery. The location may be in your groin, in your wrist, or in the fold of your arm (near your elbow).  A type of X-ray (fluoroscopy) will be used to help guide the catheter to the opening of the blood vessel that is being examined.  A dye will be injected into the catheter, and X-rays will be taken. The dye will help to show where any narrowing or blockages are located in the heart arteries.  Tell your health care provider if you have any chest pain or trouble breathing during the procedure.  If blockages are found, your health care provider may perform another procedure, such as inserting a coronary stent. The procedure may vary among health care providers and hospitals. What happens after the procedure?  After the procedure, you will need to keep the area still for a few hours, or for as long as told by your health care provider. If the procedure is done through the groin, you will be instructed to not bend and not  cross your legs.  The insertion site will be checked frequently.  The pulse in your foot or wrist will be checked frequently.  You may have additional blood tests, X-rays, and a test that records the electrical activity of your heart (ECG).  Do not drive for 24 hours if you were given a sedative. Summary  A coronary angiogram is an X-ray procedure that is used to look into the arteries in the heart.  During the procedure, a dye (contrast dye) is injected through a long, thin tube (catheter). The catheter is inserted through the groin, wrist, or arm.  Tell your health care provider about any allergies you have, including allergies to contrast dye.  After the procedure, you will need to keep the area still for a few hours, or for as long as told by your health care provider. This information is not  intended to replace advice given to you by your health care provider. Make sure you discuss any questions you have with your health care provider. Document Released: 08/25/2002 Document Revised: 12/01/2015 Document Reviewed: 12/01/2015 Elsevier Interactive Patient Education  2017 [employer].

## 2016-04-15 NOTE — Discharge Summary (Signed)
NAMEMarland Casey  TUONGVI, VARNEY                  ACCOUNT NO.:  [id number]  MEDICAL RECORD NO.:  [id number]  LOCATION:  C24C                         FACILITY:  [facility]  PHYSICIAN:  Allegra Lai. Terrence Dupont, M.D. DATE OF BIRTH:  08-16-1924  DATE OF ADMISSION:  04/13/2016 DATE OF DISCHARGE:  04/15/2016                              DISCHARGE SUMMARY   ADMITTING DIAGNOSES:  Unstable angina with positive nuclear stress test, recently rule out myocardial infarction, rule out progression of coronary artery disease. Coronary artery disease, history of non-Q-wave myocardial infarction in the past, status post percutaneous coronary intervention to mid left anterior descending artery in the past. Hypertension. Hyperlipidemia. History of paroxysmal atrial fibrillation in the past. History of gastrointestinal bleed in the past. Peripheral vascular disease. Gastroesophageal reflux disease. Degenerative joint disease. Anemia of chronic disease, stable.  DISCHARGE DIAGNOSES:  Stable angina, myocardial infarction ruled out, status post left cardiac catheterization, noted to have patent left anterior descending artery stent. Coronary artery disease, history of non-Q-wave myocardial infarction in the past, status post percutaneous coronary intervention to mid left anterior descending artery in the past. Hypertension. Hyperlipidemia. History of paroxysmal atrial fibrillation in the past. History of gastrointestinal bleed in the past. Peripheral vascular disease. Gastroesophageal reflux disease. Degenerative joint disease. Anemia of chronic disease.  DISCHARGE MEDICATIONS: 1. Tylenol 325 mg every 6 hours as needed for pain. 2. Amiodarone 200 mg daily. 3. Amlodipine 5 mg daily. 4. Aspirin 81 mg 1 tablet daily. 5. Clopidogrel 75 mg daily. 6. Bentyl 20 mg half tablet four times daily as needed. 7. Albuterol inhaler p.r.n. 8. Losartan 100 mg daily. 9. Nitrostat sublingual p.r.n. 10.Protonix 40 mg daily. 11.Crestor  20 mg daily. 12.Vitamin D 1 capsule every week 50,000 units.  The patient has been     advised to stop Xanax.  DIET:  Low salt, low cholesterol.  ACTIVITY:  Increase activity slowly as tolerated.  Post cardiac cath instructions have been given.  FOLLOWUP:  Follow up with me in 1 week.  CONDITION AT DISCHARGE:  Stable.  BRIEF HISTORY AND HOSPITAL COURSE:  Ms. Virginia Casey is 83 year old female with past medical history significant for multiple medical problems, i.e., coronary artery disease; history of non-Q-wave MI in the past, status post PTCA stenting to mid LAD; hypertension; hyperlipidemia; history of paroxysmal AFib, CHADS-VASc score of 5; history of GI bleed in the past; anemia of chronic disease; peripheral vascular disease. The patient not the candidate for chronic anticoagulation.  She came to the ER by EMS, complaining of left-sided chest pain, radiating to the left arm, grade 8/10, associated with nausea and mild diaphoresis and took 1 sublingual nitro without relief, so called EMS and received 2 more sublingual nitro and 4 baby aspirin with relief of chest pain.  The patient denies any palpitation, lightheadedness or syncope.  Denies shortness of breath.  Denies cough, fever or chills.  The patient had similar complaints approximately 2 weeks ago and had nuclear stress test on April 10, 2016, which showed moderate perfusion defect in the lateral wall with normal EF.  PHYSICAL EXAMINATION:  GENERAL:  She was alert, awake, oriented x3.  No acute distress. VITAL SIGNS:  Blood pressure was  122/60, pulse 97, she was afebrile. HEENT:  Conjunctivae was pink. NECK:  Supple.  No JVD.  No bruit. LUNGS:  Clear to auscultation without rhonchi or rales. CARDIOVASCULAR:  S1, S2 was normal.  There were soft systolic murmur and S4 gallop. ABDOMEN:  Soft.  Bowel sounds were present.  Nontender. EXTREMITIES:  There was no clubbing, cyanosis, or edema.  LABORATORY DATA:  Sodium was  139, potassium 4.6, BUN 28, creatinine 1.48.  Three sets of troponin-I were normal.  Cholesterol was 125, HDL 65, LDL 55.  Repeat creatinine was 1.36, 1.35.  EKG done in the ED showed normal sinus rhythm with minimal ST depression in bilateral leads.  BRIEF HOSPITAL COURSE:  The patient was admitted to telemetry unit.  MI was ruled out by serial enzymes and EKG.  The patient did not have any further episodes of chest pain during the hospital stay.  The patient subsequently underwent left cardiac cath with selective left and right coronary angiography.  As per procedure report, the patient tolerated the procedure well.  The patient was noted to have patent LAD stent. Post procedure, the patient did not have any chest pain.  Her groin is stable with no evidence of hematoma or bruit.  The patient has ambulated in room without any problems.  The patient will be discharged home on above medications and will be followed up in my office in 1 week.    Allegra Lai. Terrence Dupont, M.D.    MNH/MEDQ  D:  04/15/2016  T:  04/15/2016  Job:  [id number]

## 2016-04-17 ENCOUNTER — Encounter (HOSPITAL_COMMUNITY): Payer: Self-pay

## 2016-04-18 ENCOUNTER — Encounter (HOSPITAL_COMMUNITY): Admission: RE | Payer: Self-pay | Source: Ambulatory Visit

## 2016-04-18 ENCOUNTER — Ambulatory Visit (HOSPITAL_COMMUNITY): Admission: RE | Admit: 2016-04-18 | Payer: Medicare Other | Source: Ambulatory Visit | Admitting: Cardiology

## 2016-04-18 SURGERY — LEFT HEART CATH AND CORONARY ANGIOGRAPHY

## 2016-04-19 ENCOUNTER — Encounter (HOSPITAL_COMMUNITY): Payer: Self-pay

## 2016-04-22 ENCOUNTER — Encounter (HOSPITAL_COMMUNITY): Payer: Self-pay

## 2016-04-22 DIAGNOSIS — I480 Paroxysmal atrial fibrillation: Secondary | ICD-10-CM | POA: Diagnosis not present

## 2016-04-22 DIAGNOSIS — I252 Old myocardial infarction: Secondary | ICD-10-CM | POA: Diagnosis not present

## 2016-04-22 DIAGNOSIS — N189 Chronic kidney disease, unspecified: Secondary | ICD-10-CM | POA: Diagnosis not present

## 2016-04-22 DIAGNOSIS — I25118 Atherosclerotic heart disease of native coronary artery with other forms of angina pectoris: Secondary | ICD-10-CM | POA: Diagnosis not present

## 2016-04-22 DIAGNOSIS — I129 Hypertensive chronic kidney disease with stage 1 through stage 4 chronic kidney disease, or unspecified chronic kidney disease: Secondary | ICD-10-CM | POA: Diagnosis not present

## 2016-04-24 ENCOUNTER — Encounter (HOSPITAL_COMMUNITY)
Admission: RE | Admit: 2016-04-24 | Discharge: 2016-04-24 | Disposition: A | Discharge status: Home / Self Care | Payer: Self-pay | Source: Ambulatory Visit | Attending: Cardiology | Admitting: Cardiology

## 2016-04-26 ENCOUNTER — Encounter (HOSPITAL_COMMUNITY)
Admission: RE | Admit: 2016-04-26 | Discharge: 2016-04-26 | Disposition: A | Discharge status: Home / Self Care | Payer: Self-pay | Source: Ambulatory Visit | Attending: Cardiology | Admitting: Cardiology

## 2016-04-29 ENCOUNTER — Encounter (HOSPITAL_COMMUNITY): Payer: Self-pay

## 2016-04-30 ENCOUNTER — Other Ambulatory Visit: Payer: Self-pay | Admitting: Pulmonary Disease

## 2016-05-01 ENCOUNTER — Encounter (HOSPITAL_COMMUNITY)
Admission: RE | Admit: 2016-05-01 | Discharge: 2016-05-01 | Disposition: A | Discharge status: Home / Self Care | Payer: Self-pay | Source: Ambulatory Visit | Attending: Cardiology | Admitting: Cardiology

## 2016-05-03 ENCOUNTER — Encounter (HOSPITAL_COMMUNITY)
Admission: RE | Admit: 2016-05-03 | Discharge: 2016-05-03 | Disposition: A | Discharge status: Home / Self Care | Payer: Self-pay | Source: Ambulatory Visit | Attending: Cardiology | Admitting: Cardiology

## 2016-05-03 DIAGNOSIS — I2510 Atherosclerotic heart disease of native coronary artery without angina pectoris: Secondary | ICD-10-CM | POA: Insufficient documentation

## 2016-05-06 ENCOUNTER — Encounter (HOSPITAL_COMMUNITY)
Admission: RE | Admit: 2016-05-06 | Discharge: 2016-05-06 | Disposition: A | Discharge status: Home / Self Care | Payer: Self-pay | Source: Ambulatory Visit | Attending: Cardiology | Admitting: Cardiology

## 2016-05-08 ENCOUNTER — Encounter (HOSPITAL_COMMUNITY)
Admission: RE | Admit: 2016-05-08 | Discharge: 2016-05-08 | Disposition: A | Discharge status: Home / Self Care | Payer: Self-pay | Source: Ambulatory Visit | Attending: Cardiology | Admitting: Cardiology

## 2016-05-10 ENCOUNTER — Encounter (HOSPITAL_COMMUNITY): Payer: Self-pay

## 2016-05-13 ENCOUNTER — Encounter (HOSPITAL_COMMUNITY): Payer: Self-pay

## 2016-05-15 ENCOUNTER — Encounter (HOSPITAL_COMMUNITY)
Admission: RE | Admit: 2016-05-15 | Discharge: 2016-05-15 | Disposition: A | Discharge status: Home / Self Care | Payer: Self-pay | Source: Ambulatory Visit | Attending: Cardiology | Admitting: Cardiology

## 2016-05-17 ENCOUNTER — Encounter (HOSPITAL_COMMUNITY)
Admission: RE | Admit: 2016-05-17 | Discharge: 2016-05-17 | Disposition: A | Discharge status: Home / Self Care | Payer: Self-pay | Source: Ambulatory Visit | Attending: Cardiology | Admitting: Cardiology

## 2016-05-20 ENCOUNTER — Encounter (HOSPITAL_COMMUNITY)
Admission: RE | Admit: 2016-05-20 | Discharge: 2016-05-20 | Disposition: A | Discharge status: Home / Self Care | Payer: Self-pay | Source: Ambulatory Visit | Attending: Cardiology | Admitting: Cardiology

## 2016-05-22 ENCOUNTER — Encounter (HOSPITAL_COMMUNITY): Payer: Self-pay

## 2016-05-24 ENCOUNTER — Encounter (HOSPITAL_COMMUNITY)
Admission: RE | Admit: 2016-05-24 | Discharge: 2016-05-24 | Disposition: A | Discharge status: Home / Self Care | Payer: Self-pay | Source: Ambulatory Visit | Attending: Cardiology | Admitting: Cardiology

## 2016-05-27 ENCOUNTER — Encounter (HOSPITAL_COMMUNITY)
Admission: RE | Admit: 2016-05-27 | Discharge: 2016-05-27 | Disposition: A | Discharge status: Home / Self Care | Payer: Self-pay | Source: Ambulatory Visit | Attending: Cardiology | Admitting: Cardiology

## 2016-05-29 ENCOUNTER — Encounter (HOSPITAL_COMMUNITY)
Admission: RE | Admit: 2016-05-29 | Discharge: 2016-05-29 | Disposition: A | Discharge status: Home / Self Care | Payer: Self-pay | Source: Ambulatory Visit | Attending: Cardiology | Admitting: Cardiology

## 2016-05-31 ENCOUNTER — Encounter (HOSPITAL_COMMUNITY): Payer: Self-pay

## 2016-06-03 ENCOUNTER — Encounter (HOSPITAL_COMMUNITY)
Admission: RE | Admit: 2016-06-03 | Discharge: 2016-06-03 | Disposition: A | Discharge status: Home / Self Care | Payer: Self-pay | Source: Ambulatory Visit | Attending: Cardiology | Admitting: Cardiology

## 2016-06-03 DIAGNOSIS — I2510 Atherosclerotic heart disease of native coronary artery without angina pectoris: Secondary | ICD-10-CM | POA: Insufficient documentation

## 2016-06-05 ENCOUNTER — Encounter (HOSPITAL_COMMUNITY): Payer: Self-pay

## 2016-06-07 ENCOUNTER — Encounter (HOSPITAL_COMMUNITY): Payer: Self-pay

## 2016-06-10 ENCOUNTER — Encounter (HOSPITAL_COMMUNITY)
Admission: RE | Admit: 2016-06-10 | Discharge: 2016-06-10 | Disposition: A | Discharge status: Home / Self Care | Payer: Self-pay | Source: Ambulatory Visit | Attending: Cardiology | Admitting: Cardiology

## 2016-06-12 ENCOUNTER — Encounter (HOSPITAL_COMMUNITY)
Admission: RE | Admit: 2016-06-12 | Discharge: 2016-06-12 | Disposition: A | Discharge status: Home / Self Care | Payer: Self-pay | Source: Ambulatory Visit | Attending: Cardiology | Admitting: Cardiology

## 2016-06-14 ENCOUNTER — Encounter (HOSPITAL_COMMUNITY)
Admission: RE | Admit: 2016-06-14 | Discharge: 2016-06-14 | Disposition: A | Discharge status: Home / Self Care | Payer: Self-pay | Source: Ambulatory Visit | Attending: Cardiology | Admitting: Cardiology

## 2016-06-16 ENCOUNTER — Emergency Department (HOSPITAL_COMMUNITY): Payer: Medicare Other

## 2016-06-16 ENCOUNTER — Encounter (HOSPITAL_COMMUNITY): Payer: Self-pay | Admitting: Emergency Medicine

## 2016-06-16 ENCOUNTER — Emergency Department (HOSPITAL_COMMUNITY)
Admission: EM | Admit: 2016-06-16 | Discharge: 2016-06-16 | Disposition: A | Discharge status: Home / Self Care | Payer: Medicare Other | Attending: Emergency Medicine | Admitting: Emergency Medicine

## 2016-06-16 DIAGNOSIS — R109 Unspecified abdominal pain: Secondary | ICD-10-CM

## 2016-06-16 DIAGNOSIS — N200 Calculus of kidney: Secondary | ICD-10-CM | POA: Diagnosis not present

## 2016-06-16 DIAGNOSIS — R1033 Periumbilical pain: Secondary | ICD-10-CM | POA: Insufficient documentation

## 2016-06-16 DIAGNOSIS — R1032 Left lower quadrant pain: Secondary | ICD-10-CM | POA: Insufficient documentation

## 2016-06-16 LAB — URINALYSIS, ROUTINE W REFLEX MICROSCOPIC
Bilirubin Urine: NEGATIVE
Glucose, UA: NEGATIVE mg/dL
Ketones, ur: NEGATIVE mg/dL
Nitrite: NEGATIVE
PROTEIN: 30 mg/dL — AB
Specific Gravity, Urine: 1.016 (ref 1.005–1.030)
pH: 5 (ref 5.0–8.0)

## 2016-06-16 LAB — COMPREHENSIVE METABOLIC PANEL
ALT: 20 U/L (ref 14–54)
AST: 26 U/L (ref 15–41)
Albumin: 3.6 g/dL (ref 3.5–5.0)
Alkaline Phosphatase: 77 U/L (ref 38–126)
Anion gap: 7 (ref 5–15)
BUN: 37 mg/dL — ABNORMAL HIGH (ref 6–20)
CALCIUM: 8.9 mg/dL (ref 8.9–10.3)
CHLORIDE: 109 mmol/L (ref 101–111)
CO2: 25 mmol/L (ref 22–32)
CREATININE: 1.71 mg/dL — AB (ref 0.44–1.00)
GFR, EST AFRICAN AMERICAN: 29 mL/min — AB (ref >60)
GFR, EST NON AFRICAN AMERICAN: 25 mL/min — AB (ref >60)
Glucose, Bld: 99 mg/dL (ref 65–99)
Potassium: 4.7 mmol/L (ref 3.5–5.1)
Sodium: 141 mmol/L (ref 135–145)
TOTAL PROTEIN: 7 g/dL (ref 6.5–8.1)
Total Bilirubin: 0.6 mg/dL (ref 0.3–1.2)

## 2016-06-16 LAB — CBC
HCT: 29.6 % — ABNORMAL LOW (ref 36.0–46.0)
Hemoglobin: 10 g/dL — ABNORMAL LOW (ref 12.0–15.0)
MCH: 25 pg — ABNORMAL LOW (ref 26.0–34.0)
MCHC: 33.8 g/dL (ref 30.0–36.0)
MCV: 74 fL — AB (ref 78.0–100.0)
Platelets: 138 10*3/uL — ABNORMAL LOW (ref 150–400)
RBC: 4 MIL/uL (ref 3.87–5.11)
RDW: 15.5 % (ref 11.5–15.5)
WBC: 8.7 10*3/uL (ref 4.0–10.5)

## 2016-06-16 LAB — POC OCCULT BLOOD, ED: FECAL OCCULT BLD: POSITIVE — AB

## 2016-06-16 LAB — LIPASE, BLOOD: LIPASE: 39 U/L (ref 11–51)

## 2016-06-16 MED ORDER — IOPAMIDOL (ISOVUE-300) INJECTION 61%
INTRAVENOUS | Status: AC
  Administered 2016-06-16: 30 mL
  Filled 2016-06-16: qty 30

## 2016-06-16 NOTE — ED Provider Notes (Signed)
Medical screening examination/treatment/procedure(s) were conducted as a shared visit with non-physician practitioner(s) and myself.  I personally evaluated the patient during the encounter.  83 yo F w/ mild abdominal pain and a couple episodes of brb on toilet paper after wiping.  HDS on my exam. Benign abdomen, overall appears well. PA performed rectal exam and no obvious abnormalties but positive hemoccult.  Labs with small drop in hemoglobin, but otherwise labs unremarkable. In ER for multiple hours without decompensation. Discussed with Dr. Collene Mares on phone, who thinks patient is likely stable for outpatient management. Patient will call cardiologist about plavix/ASA tomorrow and follow up with [facility] when able.     Merrily Pew, MD 06/17/16 [phone]

## 2016-06-16 NOTE — Discharge Instructions (Signed)
Do not take your Aspirin or Plavix tonight or tomorrow.   Tomorrow morning, call your heart doctor and explain to him that you were seen in the Emergency Department for abdominal pain and some bleeding and ask him whether or not to restart it.   Call your primary care doctor tomorrow and arrange to be seen in the next 1-2 days.  Call your GI doctor at [facility] and arrange to be seen tomorrow morning. If they cannot see you, call the GI doctor listed above to be seen in their office.   If you have any worsening pain, worsening bleeding, fever, persistent vomiting or any other concerns, back to the Emergency Dept immediately.

## 2016-06-16 NOTE — ED Provider Notes (Signed)
[facility] DEPT Provider Note   CSN: [id number] Arrival date & time: 06/16/16  1147     History   Chief Complaint Chief Complaint  Patient presents with  . Abdominal Pain  . Rectal Bleeding    HPI ANNISSA Casey is a 83 y.o. female who presents with periumbilical and suprapubic abdominal pain that began this morning. Patient states that pain is a 8/10 and denies any alleviating or aggravating factors. She has not taken any medications for pain. She had two bowel movements after onset of pain that were both soft. She noted some blood on the tissue but denies any on the stool or in the toilet bowel. She denies any dark stools. She denies any rectal pain or pain with passage of stool. She reports decreased PO since onset. She denies any fevers, CP, SOB.   The history is provided by the patient.    Past Medical History:  Diagnosis Date  . Anemia   . Anxiety   . Atherosclerotic heart disease   . Atrial flutter, paroxysmal ([place])   . Colon polyp   . Diverticulosis of colon   . DJD (degenerative joint disease)    "legs" (10/06/2012)  . Exertional shortness of breath   . GERD (gastroesophageal reflux disease)   . Hiatal hernia   . History of blood transfusion ? 2010  . HTN (hypertension)   . Hypercholesterolemia   . Myocardial infarction ([place]) 2010  . Pneumonia    "once; years ago" (10/06/2012)  . PVD (peripheral vascular disease) ([place])   . Vitamin D deficiency     Patient Active Problem List   Diagnosis Date Noted  . Unstable angina ([place]) 04/13/2016  . Fall 05/12/2015  . IBS (irritable bowel syndrome) 07/30/2014  . Asthmatic bronchitis 07/16/2010  . Disorder of thyroid 04/18/2010  . Vitamin D deficiency 07/26/2008  . BACK PAIN, LUMBAR 07/26/2008  . ATRIAL FLUTTER, PAROXYSMAL 07/11/2008  . Diverticulosis of large intestine 07/09/2007  . COLONIC POLYPS 03/16/2007  . HYPERCHOLESTEROLEMIA 03/16/2007  . Anemia 03/16/2007  . Anxiety state 03/16/2007  . Essential  hypertension 03/16/2007  . Coronary atherosclerosis 03/16/2007  . Peripheral vascular disease ([place]) 03/16/2007  . Diaphragmatic hernia 03/16/2007  . Osteoarthritis 03/16/2007    Past Surgical History:  Procedure Laterality Date  . CORONARY ANGIOPLASTY  10/06/2012  . CORONARY ANGIOPLASTY WITH STENT PLACEMENT  04/2008   "1; Dr Terrence Dupont" (10/06/2012)  . DILATION AND CURETTAGE OF UTERUS    . LEFT HEART CATH AND CORONARY ANGIOGRAPHY N/A 04/15/2016   Procedure: Left Heart Cath and Coronary Angiography;  Surgeon: Charolette Forward, MD;  Location: [facility];  Service: Cardiovascular;  Laterality: N/A;  . LEFT HEART CATHETERIZATION WITH CORONARY ANGIOGRAM N/A 10/06/2012   Procedure: LEFT HEART CATHETERIZATION WITH CORONARY ANGIOGRAM;  Surgeon: Clent Demark, MD;  Location: [facility];  Service: Cardiovascular;  Laterality: N/A;  . TOTAL HIP ARTHROPLASTY Right 12/09   Dr Marlou Sa    OB History    No data available       Home Medications    Prior to Admission medications   Medication Sig Start Date End Date Taking? Authorizing Provider  acetaminophen (TYLENOL) 325 MG tablet Take 325 mg by mouth every 6 (six) hours as needed. For pain   Yes Historical Provider, MD  ALPRAZolam (XANAX) 0.25 MG tablet Take 0.25 mg by mouth 3 (three) times daily as needed for anxiety. 03/12/16  Yes Historical Provider, MD  amiodarone (PACERONE) 200 MG tablet Take 200 mg by  mouth daily.    Yes Historical Provider, MD  amLODipine (NORVASC) 5 MG tablet Take 5 mg by mouth daily.   Yes Historical Provider, MD  aspirin 81 MG tablet Take 324 mg by mouth daily.    Yes Historical Provider, MD  clopidogrel (PLAVIX) 75 MG tablet Take 1 tablet (75 mg total) by mouth daily with breakfast. 07/26/15  Yes Noralee Space, MD  losartan (COZAAR) 100 MG tablet Take 1 tablet (100 mg total) by mouth daily. 07/18/15  Yes Noralee Space, MD  nitroGLYCERIN (NITROSTAT) 0.4 MG SL tablet Place 0.4 mg under the tongue every 5 (five) minutes as  needed. May repeat x3    Yes Historical Provider, MD  pantoprazole (PROTONIX) 40 MG tablet take 1 tablet by mouth twice a day 05/01/16  Yes Noralee Space, MD  rosuvastatin (CRESTOR) 20 MG tablet Take 20 mg by mouth at bedtime.    Yes Historical Provider, MD  Vitamin D, Ergocalciferol, (DRISDOL) 50000 units CAPS capsule Take 1 capsule (50,000 Units total) by mouth every 7 (seven) days. 05/22/15  Yes Noralee Space, MD  dicyclomine (BENTYL) 20 MG tablet Take 1/2-1 tablet by mouth up to 4 times a day as needed for abdominal cramping Patient not taking: Reported on 06/16/2016 07/27/14   Noralee Space, MD  Ipratropium-Albuterol (COMBIVENT RESPIMAT) 20-100 MCG/ACT AERS respimat Inhale 1 puff into the lungs every 6 (six) hours. Patient not taking: Reported on 06/16/2016 09/22/12   Noralee Space, MD    Family History Family History  Problem Relation Age of Onset  . Heart disease Mother   . Rheum arthritis Mother   . Heart disease Maternal Grandmother   . Heart disease Maternal Grandfather   . Rheum arthritis Father   . Lung cancer Father   . Stroke Maternal Aunt     Social History Social History  Substance Use Topics  . Smoking status: Never Smoker  . Smokeless tobacco: Never Used  . Alcohol use No     Allergies   Patient has no known allergies.   Review of Systems Review of Systems  Constitutional: Negative for chills.  HENT: Negative for congestion, rhinorrhea and sore throat.   Respiratory: Negative for cough and shortness of breath.   Cardiovascular: Negative for chest pain.  Gastrointestinal: Positive for abdominal pain and blood in stool. Negative for constipation, diarrhea, nausea and vomiting.  Genitourinary: Negative for dysuria and hematuria.  Musculoskeletal: Negative for back pain.  Skin: Negative for rash.  Neurological: Negative for weakness, numbness and headaches.  Psychiatric/Behavioral: Negative for confusion.  All other systems reviewed and are  negative.    Physical Exam Updated Vital Signs BP (!) 118/51   Pulse (!) 52   Temp 98.3 F (36.8 C) (Oral)   Resp 18   SpO2 97%   Physical Exam  Constitutional: She is oriented to person, place, and time. She appears well-developed and well-nourished.  HENT:  Head: Normocephalic and atraumatic.  Mouth/Throat: Oropharynx is clear and moist and mucous membranes are normal.  Eyes: Conjunctivae, EOM and lids are normal. Pupils are equal, round, and reactive to light.  Neck: Full passive range of motion without pain.  Cardiovascular: Normal rate, regular rhythm, normal heart sounds and normal pulses.  Exam reveals no gallop and no friction rub.   No murmur heard. Pulses:      Dorsalis pedis pulses are 2+ on the right side, and 2+ on the left side.  Pulmonary/Chest: Effort normal and breath sounds normal.  Abdominal: Soft. Normal appearance. There is tenderness in the periumbilical area, suprapubic area, left upper quadrant and left lower quadrant. There is no rigidity and no guarding.  Genitourinary: Rectal exam shows no external hemorrhoid, no fissure, no mass and no tenderness.  Genitourinary Comments: The exam was performed with a chaperone present. No gross blood on rectal exam. Stool heme positive.   Musculoskeletal: Normal range of motion.  Neurological: She is alert and oriented to person, place, and time.  Skin: Skin is warm and dry. Capillary refill takes less than 2 seconds.  Psychiatric: She has a normal mood and affect. Her speech is normal.  Nursing note and vitals reviewed.    ED Treatments / Results  Labs (all labs ordered are listed, but only abnormal results are displayed) Labs Reviewed  COMPREHENSIVE METABOLIC PANEL - Abnormal; Notable for the following:       Result Value   BUN 37 (*)    Creatinine, Ser 1.71 (*)    GFR calc non Af Amer 25 (*)    GFR calc Af Amer 29 (*)    All other components within normal limits  CBC - Abnormal; Notable for the  following:    Hemoglobin 10.0 (*)    HCT 29.6 (*)    MCV 74.0 (*)    MCH 25.0 (*)    Platelets 138 (*)    All other components within normal limits  URINALYSIS, ROUTINE W REFLEX MICROSCOPIC - Abnormal; Notable for the following:    Hgb urine dipstick MODERATE (*)    Protein, ur 30 (*)    Leukocytes, UA TRACE (*)    Bacteria, UA RARE (*)    Squamous Epithelial / LPF 0-5 (*)    All other components within normal limits  POC OCCULT BLOOD, ED - Abnormal; Notable for the following:    Fecal Occult Bld POSITIVE (*)    All other components within normal limits  LIPASE, BLOOD  TYPE AND SCREEN    EKG  EKG Interpretation None       Radiology Ct Abdomen Pelvis Wo Contrast  Result Date: 06/16/2016 CLINICAL DATA:  Abdominal pain, history of diverticulitis, noted some light bleeding on toilet paper after bowel movement EXAM: CT ABDOMEN AND PELVIS WITHOUT CONTRAST TECHNIQUE: Multidetector CT imaging of the abdomen and pelvis was performed following the standard protocol without IV contrast. COMPARISON:  01/25/2009 FINDINGS: Lower chest: Lung bases shows no acute findings. There is large diaphragmatic hernia with herniation of the stomach in retrocardiac position. Partial herniation of the transverse colon. No evidence of gastric outlet obstruction or gastric volvulus. The hernia measures at least 12.8 by 6 cm. Mild fibrotic changes are noted bilateral lower lobe peripheral. Hepatobiliary: No intrahepatic biliary ductal dilatation. Gallbladder is contracted. Multiple layering gallstones and sludge noted within nondistended gallbladder. Pancreas: Unenhanced pancreas without focal abnormality. Spleen: Unenhanced spleen without focal abnormality. Adrenals/Urinary Tract: No adrenal gland mass. No hydronephrosis. There is a cyst in upper pole anterior aspect of the right kidney measures 3.4 cm. Nonobstructive punctate calcified calculus in lower pole of the right kidney measures 3 mm. No hydronephrosis or  hydroureter. No calcified ureteral calculi are noted. The urinary bladder is under distended. Limited assessment of the bladder due to extensive metallic artifacts from right hip prosthesis. Stomach/Bowel: Oral contrast material was given to the patient. There is no small bowel obstruction. No pericecal inflammation. There is some contrast material within cecum and right colon. Multiple right colon diverticula. Scattered diverticula are noted transverse colon. Multiple diverticula  are noted descending colon. Multiple diverticula are noted sigmoid colon. No definite evidence of acute diverticulitis. Moderate gas noted within rectum. Some thickening of anal wall. Hemorrhoids cannot be excluded. No evidence of acute colitis. Vascular/Lymphatic: A extensive atherosclerotic calcifications of abdominal aorta, SMA, iliac arteries. No aortic aneurysm. No retroperitoneal or mesenteric adenopathy. Reproductive: The uterus is atrophic.  No pelvic mass. Other: No ascites or free abdominal air. There is small umbilical hernia containing fat without evidence of acute complication. Musculoskeletal: No destructive bony lesions are noted. Osteopenia and mild degenerative changes thoracolumbar spine. IMPRESSION: 1. There is large midline posterior diaphragmatic hernia with herniation of the stomach in retrocardiac position. Partial herniation of the transverse colon. No evidence of acute gastric volvulus or gastric outlet obstruction. 2. No small bowel obstruction. 3. No hydronephrosis or hydroureter. There is right nonobstructive nephrolithiasis. No calcified ureteral calculi. 4. Multiple colonic diverticula are noted. No evidence of acute colitis or acute diverticulitis. No definite evidence of colonic obstruction. 5. Moderate gas noted within rectum. Some thickening of anal wall. Proctitis or hemorrhoids cannot be excluded. Limited evaluation of the pelvis due to extensive metallic artifacts from right hip prosthesis.  Electronically Signed   By: Lahoma Crocker M.D.   On: 06/16/2016 17:41    Procedures Procedures (including critical care time)  Medications Ordered in ED Medications  iopamidol (ISOVUE-300) 61 % injection (30 mLs  Contrast Given 06/16/16 1537)     Initial Impression / Assessment and Plan / ED Course  I have reviewed the triage vital signs and the nursing notes.  Pertinent labs & imaging results that were available during my care of the patient were reviewed by me and considered in my medical decision making (see chart for details).  Clinical Course as of Jun 17 2238  Sun Jun 16, 2016  2228 Glucose: 99 [LL]    Clinical Course User Index [LL] Volanda Napoleon, PA-C   83 yo F with PMH/o Diverticulitis, Afib rhythm controlled with Amiodarone who presents with abdominal pain that began this morning. Patient is afebrile, non-toxic appearing, sitting comfortably on examination table. Had 2 bowel movements at home and noted blood on the tissue. No gross blood seen on stool or in toilet. Consider anal fissure vs GI bleed vs internal hemorrhoids vs diverticulitis. Low concern for mesenteric ischemia given history/exam. Will check CBC, CMP, hemoccult, Type & Screen. Will also check CT Abd/Pelvis. Patient offered analgesics but she declined at this time.   Labs and imaging reviewed. CBC shows Hgb/Hct at 10.0/29.6. Records reviewed and shows that patient has a history of anemia, most recent hemoglobin was 11.2. CT negative for any acute diverticulitis or other infectious etiology.   Re-eval: Patient reports improvement in pain without analgesics. She has had bowel movement while in the department and noted no blood. Discussed patient with Dr. Dayna Barker. Will plan to consult GI for their recommendation on how to proceed with patient.   GI consult placed.   6:20 PM: Dr. Dayna Barker discussed with Dr. Collene Mares (GI [facility]). Given patient's history and physical. As long as H/H is stable, patient can be dsicharged home  and plan to follow-up in office tomorrow.   Discussed plan with patient. She reports improvement in symptoms. Discussed results with patient. Will plan to have her follow-up with her PCP in 24-48 hours. Instructed her to call and arrange an appointment. Will also have her follow-up with GI. Instructed her to call them in the morning and arrange to be seen. Advised patient not to take  her Plavix and ASA tonight and tomorrow and then instructed her to call her cardiologist tomorrow morning and explain her ED visit and follow his recommendations for restarting those medications. Strict return precautions given. Patient and family express understanding and agreement to plan.   Final Clinical Impressions(s) / ED Diagnoses   Final diagnoses:  Abdominal pain, unspecified abdominal location    New Prescriptions Discharge Medication List as of 06/16/2016  7:04 PM       Volanda Napoleon, PA-C 06/16/16 [place], MD 06/17/16 1701

## 2016-06-16 NOTE — ED Triage Notes (Signed)
Patient states that this morning she was getting ready for church and had mid abd pain then had to have BM. Patient states that when she wiped after having BM noticed light red blood on paper. Patient denies having to strain. Patient states that BM was liquid diarrhea.  Patient reports PMH diverticulitis.

## 2016-06-17 ENCOUNTER — Encounter (HOSPITAL_COMMUNITY): Admission: RE | Admit: 2016-06-17 | Payer: Self-pay | Source: Ambulatory Visit

## 2016-06-17 ENCOUNTER — Telehealth: Payer: Self-pay | Admitting: Pulmonary Disease

## 2016-06-17 DIAGNOSIS — K921 Melena: Secondary | ICD-10-CM

## 2016-06-17 DIAGNOSIS — D649 Anemia, unspecified: Secondary | ICD-10-CM

## 2016-06-17 NOTE — Telephone Encounter (Signed)
Pt calling to speak to [facility] nurse, says she's been in hospital.Virginia Casey'

## 2016-06-17 NOTE — Telephone Encounter (Signed)
Dr. Lenna Gilford  Please Advise-  Pt went to the ER yesterday because she had a bowel movement and she saw blood in it. She stated they could not figure out anything and suggested that she reach to you and see if we could place a referral for her to see GI. Please let us know where do you recommend she goes.

## 2016-06-18 ENCOUNTER — Encounter: Payer: Self-pay | Admitting: Nurse Practitioner
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
## 2016-06-19 ENCOUNTER — Encounter (HOSPITAL_COMMUNITY): Payer: Self-pay

## 2016-06-21 ENCOUNTER — Encounter (HOSPITAL_COMMUNITY): Payer: Self-pay

## 2016-06-24 ENCOUNTER — Other Ambulatory Visit: Payer: Self-pay

## 2016-06-24 ENCOUNTER — Encounter (HOSPITAL_COMMUNITY)
Admission: RE | Admit: 2016-06-24 | Discharge: 2016-06-24 | Disposition: A | Discharge status: Home / Self Care | Payer: Self-pay | Source: Ambulatory Visit | Attending: Cardiology | Admitting: Cardiology

## 2016-06-25 ENCOUNTER — Other Ambulatory Visit (INDEPENDENT_AMBULATORY_CARE_PROVIDER_SITE_OTHER): Payer: Medicare Other

## 2016-06-25 ENCOUNTER — Ambulatory Visit (INDEPENDENT_AMBULATORY_CARE_PROVIDER_SITE_OTHER): Payer: Medicare Other | Admitting: Nurse Practitioner

## 2016-06-25 ENCOUNTER — Encounter: Payer: Self-pay | Admitting: Nurse Practitioner

## 2016-06-25 VITALS — BP 126/70 | HR 80 | Ht 59.5 in | Wt 153.4 lb

## 2016-06-25 DIAGNOSIS — K648 Other hemorrhoids: Secondary | ICD-10-CM

## 2016-06-25 DIAGNOSIS — K625 Hemorrhage of anus and rectum: Secondary | ICD-10-CM

## 2016-06-25 DIAGNOSIS — D509 Iron deficiency anemia, unspecified: Secondary | ICD-10-CM | POA: Diagnosis not present

## 2016-06-25 LAB — CBC
HEMATOCRIT: 30.4 % — AB (ref 36.0–46.0)
HEMOGLOBIN: 9.8 g/dL — AB (ref 12.0–15.0)
MCHC: 32.3 g/dL (ref 30.0–36.0)
MCV: 76.4 fl — ABNORMAL LOW (ref 78.0–100.0)
Platelets: 176 10*3/uL (ref 150.0–400.0)
RBC: 3.97 Mil/uL (ref 3.87–5.11)
RDW: 15.8 % — ABNORMAL HIGH (ref 11.5–15.5)
WBC: 7.5 10*3/uL (ref 4.0–10.5)

## 2016-06-25 LAB — FERRITIN: Ferritin: 10.8 ng/mL (ref 10.0–291.0)

## 2016-06-25 MED ORDER — HYDROCORTISONE 2.5 % RE CREA: 1.0000 "application " | TOPICAL_CREAM | Freq: Every day | RECTAL | 1 refills | Status: DC

## 2016-06-25 NOTE — Patient Instructions (Addendum)
If you are age 81 or older, your body mass index should be between 23-30. Your Body mass index is 30.46 kg/m. If this is out of the aforementioned range listed, please consider follow up with your Primary Care Provider.  If you are age 59 or younger, your body mass index should be between 19-25. Your Body mass index is 30.46 kg/m. If this is out of the aformentioned range listed, please consider follow up with your Primary Care Provider.   Your physician has requested that you go to the basement for lab work before leaving today.  We have sent the following medications to your pharmacy for you to pick up at your convenience: Anusol Cream.  Thank you for choosing me and [facility].  Follow up appointment 07/16/16 at 230 pm.  Tye Savoy, NP

## 2016-06-25 NOTE — Progress Notes (Signed)
HPI: Patient is an 83 year old female known to Dr. Henrene Pastor. She has multiple medical problems including hypertension, CAD with history of stents, AFib on amiodarone and peripheral vascular disease. She had a colonoscopy with polypectomy 2010 for evaluation of rectal bleeding. Severe diverticulosis found throughout the colon, bleeding felt to be a diverticular hemorrhage. A  small sessile polyp was removed (adenoma). Surveillance colonoscopy not recommended due to her age.  Patient seen in the emergency department for 06/16/16  with mild periumbilical pain followed by two episodes of rectal bleeding at time of urination. She did not defecate despite urge.  When patient did have a BM there was no associated bleeding. In ED patient noted to have had a small drop in her hemoglobin from 11.2 in mid February to 10. Stool was  heme positive.  Patient on Plavix and aspirin at home. Both on hold since ED spoke with Cardiology - Dr. Anne Ng  Patient is here with her niece for hospital follow-up. He feels fine, bowel movements are normal. No further rectal bleeding.   Past Medical History:  Diagnosis Date  . Anemia   . Anxiety   . Atherosclerotic heart disease   . Atrial flutter, paroxysmal ([place])   . Colon polyp   . Diverticulosis of colon   . DJD (degenerative joint disease)    "legs" (10/06/2012)  . Exertional shortness of breath   . GERD (gastroesophageal reflux disease)   . Hiatal hernia   . History of blood transfusion ? 2010  . HTN (hypertension)   . Hypercholesterolemia   . Myocardial infarction ([place]) 2010  . Pneumonia    "once; years ago" (10/06/2012)  . PVD (peripheral vascular disease) ([place])   . Vitamin D deficiency      Past Surgical History:  Procedure Laterality Date  . CORONARY ANGIOPLASTY  10/06/2012  . CORONARY ANGIOPLASTY WITH STENT PLACEMENT  04/2008   "1; Dr Terrence Dupont" (10/06/2012)  . DILATION AND CURETTAGE OF UTERUS    . LEFT HEART CATH AND CORONARY ANGIOGRAPHY N/A 04/15/2016    Procedure: Left Heart Cath and Coronary Angiography;  Surgeon: Charolette Forward, MD;  Location: [facility];  Service: Cardiovascular;  Laterality: N/A;  . LEFT HEART CATHETERIZATION WITH CORONARY ANGIOGRAM N/A 10/06/2012   Procedure: LEFT HEART CATHETERIZATION WITH CORONARY ANGIOGRAM;  Surgeon: Clent Demark, MD;  Location: [facility];  Service: Cardiovascular;  Laterality: N/A;  . TOTAL HIP ARTHROPLASTY Right 12/09   Dr Marlou Sa   Family History  Problem Relation Age of Onset  . Heart disease Mother   . Rheum arthritis Mother   . Heart disease Maternal Grandmother   . Heart disease Maternal Grandfather   . Rheum arthritis Father   . Lung cancer Father   . Stroke Maternal Aunt   . Colon cancer Neg Hx   . Stomach cancer Neg Hx   . Rectal cancer Neg Hx   . Esophageal cancer Neg Hx   . Liver cancer Neg Hx    Social History  Substance Use Topics  . Smoking status: Never Smoker  . Smokeless tobacco: Never Used  . Alcohol use No   Current Outpatient Prescriptions  Medication Sig Dispense Refill  . acetaminophen (TYLENOL) 325 MG tablet Take 325 mg by mouth every 6 (six) hours as needed. For pain    . ALPRAZolam (XANAX) 0.25 MG tablet Take 0.25 mg by mouth 3 (three) times daily as needed for anxiety.  0  . amiodarone (PACERONE) 200 MG tablet Take  200 mg by mouth daily.     [name] amLODipine (NORVASC) 5 MG tablet Take 5 mg by mouth daily.    [name] aspirin 81 MG tablet Take 324 mg by mouth daily.     . clopidogrel (PLAVIX) 75 MG tablet Take 1 tablet (75 mg total) by mouth daily with breakfast. 30 tablet 11  . dicyclomine (BENTYL) 20 MG tablet Take 1/2-1 tablet by mouth up to 4 times a day as needed for abdominal cramping 30 tablet 2  . losartan (COZAAR) 100 MG tablet Take 1 tablet (100 mg total) by mouth daily. 90 tablet 1  . nitroGLYCERIN (NITROSTAT) 0.4 MG SL tablet Place 0.4 mg under the tongue every 5 (five) minutes as needed. May repeat x3     . pantoprazole (PROTONIX) 40 MG tablet take  1 tablet by mouth twice a day 60 tablet 11  . rosuvastatin (CRESTOR) 20 MG tablet Take 20 mg by mouth at bedtime.     . Vitamin D, Ergocalciferol, (DRISDOL) 50000 units CAPS capsule Take 1 capsule (50,000 Units total) by mouth every 7 (seven) days. 4 capsule 11   No current facility-administered medications for this visit.    No Known Allergies   Review of Systems: All systems reviewed and negative except where noted in HPI.    Physical Exam: BP 126/70   Pulse 80   Ht 4' 11.5" (1.511 m)   Wt 153 lb 6 oz (69.6 kg)   BMI 30.46 kg/m  Constitutional:  Well-developed, black female in no acute distress. Psychiatric: Normal mood and affect. Behavior is normal. EENT: Pupils equal.  Conjunctivae are normal. No scleral icterus. Neck supple.  Cardiovascular: Normal rate, regular rhythm.  Pulmonary/chest: Effort normal and breath sounds normal. No wheezing, rales or rhonchi. Abdominal: Soft, nondistended, nontender. Bowel sounds active throughout. There are no masses palpable. Rectal: no external lesions . Residual light brown stool in vault. On anoscopy internal hemorrhoids seen.  Extremities: no edema Lymphadenopathy: No cervical adenopathy noted. Neurological: Alert and oriented to person place and time. Skin: Skin is warm and dry. No rashes noted.   ASSESSMENT AND PLAN:  55. 83 year old female with recent ED visit for periumbilical discomfort followed by two episodes of rectal bleeding with urination. Despite urge to defecate patient only urinated but passed some blood through rectum both times (on tissue). Hasn't had any blood associated with actual BM. Bleeding could have been been hemorrhoidal on plavix and asa though doesn't explain the periumbilical discomfort. Low volume diverticular bleeding possible but seems unlikely. Ischemic colitis possible but also seems unlikely  -Discussed options with niece in the room as well as over the telephone with patient's daughter in [place].  Patient is of advanced age with multiple comorbidities increasing risk of procedure. We could treat the internal hemorrhoids with a steroid preparation and observe for now. If recurrent bleeding then may need to weigh risks / benefits of colonoscopy off plavix. Patient has placed a call to her cardiologist to inform him of recent events. I asked for a call in a few days with a condition update.   2. Mild microcytic anemia. Baseline hgb around 13 but it dropped to 12 during admission in Feb for unstable angina. Further decline in hgb down to 10 during recent ED visit. She is microcytic on plavix and asa.  -check iron studies, she may need supplementation.    3. CAD / hx of MI / PCI. Recently admitted with stable angina. ACS ruled out. On Plavix and ASA.  4. AFib, on amiodarone   Tye Savoy, NP  06/25/2016, 1:42 PM

## 2016-06-26 ENCOUNTER — Encounter (HOSPITAL_COMMUNITY)
Admission: RE | Admit: 2016-06-26 | Discharge: 2016-06-26 | Disposition: A | Discharge status: Home / Self Care | Payer: Self-pay | Source: Ambulatory Visit | Attending: Cardiology | Admitting: Cardiology

## 2016-06-26 LAB — IRON AND TIBC
%SAT: 3 % — AB (ref 11–50)
Iron: 15 ug/dL — ABNORMAL LOW (ref 45–160)
TIBC: 436 ug/dL (ref 250–450)
UIBC: 421 ug/dL — ABNORMAL HIGH (ref 125–400)

## 2016-06-27 NOTE — Progress Notes (Signed)
Agree with conservative measures given her advanced age, significant comorbidities, and extremely high risk for invasive procedural work.

## 2016-06-28 ENCOUNTER — Encounter (HOSPITAL_COMMUNITY)
Admission: RE | Admit: 2016-06-28 | Discharge: 2016-06-28 | Disposition: A | Discharge status: Home / Self Care | Payer: Self-pay | Source: Ambulatory Visit | Attending: Cardiology | Admitting: Cardiology

## 2016-07-01 ENCOUNTER — Encounter (HOSPITAL_COMMUNITY)
Admission: RE | Admit: 2016-07-01 | Discharge: 2016-07-01 | Disposition: A | Discharge status: Home / Self Care | Payer: Self-pay | Source: Ambulatory Visit | Attending: Cardiology | Admitting: Cardiology

## 2016-07-03 ENCOUNTER — Encounter (HOSPITAL_COMMUNITY)
Admission: RE | Admit: 2016-07-03 | Discharge: 2016-07-03 | Disposition: A | Discharge status: Home / Self Care | Payer: Self-pay | Source: Ambulatory Visit | Attending: Cardiology | Admitting: Cardiology

## 2016-07-03 DIAGNOSIS — I2510 Atherosclerotic heart disease of native coronary artery without angina pectoris: Secondary | ICD-10-CM | POA: Insufficient documentation

## 2016-07-05 ENCOUNTER — Encounter (HOSPITAL_COMMUNITY): Payer: Self-pay

## 2016-07-08 ENCOUNTER — Encounter (HOSPITAL_COMMUNITY)
Admission: RE | Admit: 2016-07-08 | Discharge: 2016-07-08 | Disposition: A | Discharge status: Home / Self Care | Payer: Self-pay | Source: Ambulatory Visit | Attending: Cardiology | Admitting: Cardiology

## 2016-07-10 ENCOUNTER — Encounter (HOSPITAL_COMMUNITY)
Admission: RE | Admit: 2016-07-10 | Discharge: 2016-07-10 | Disposition: A | Discharge status: Home / Self Care | Payer: Self-pay | Source: Ambulatory Visit | Attending: Cardiology | Admitting: Cardiology

## 2016-07-12 ENCOUNTER — Encounter (HOSPITAL_COMMUNITY): Payer: Self-pay

## 2016-07-15 ENCOUNTER — Encounter (HOSPITAL_COMMUNITY): Payer: Self-pay

## 2016-07-16 ENCOUNTER — Encounter: Payer: Self-pay | Admitting: Nurse Practitioner

## 2016-07-16 ENCOUNTER — Ambulatory Visit (INDEPENDENT_AMBULATORY_CARE_PROVIDER_SITE_OTHER): Payer: Medicare Other | Admitting: Nurse Practitioner

## 2016-07-16 ENCOUNTER — Other Ambulatory Visit (INDEPENDENT_AMBULATORY_CARE_PROVIDER_SITE_OTHER): Payer: Medicare Other

## 2016-07-16 VITALS — BP 154/82 | HR 80 | Ht 59.5 in | Wt 153.0 lb

## 2016-07-16 DIAGNOSIS — D509 Iron deficiency anemia, unspecified: Secondary | ICD-10-CM

## 2016-07-16 LAB — CBC
HCT: 31.3 % — ABNORMAL LOW (ref 36.0–46.0)
Hemoglobin: 10 g/dL — ABNORMAL LOW (ref 12.0–15.0)
MCHC: 31.9 g/dL (ref 30.0–36.0)
MCV: 72.8 fl — ABNORMAL LOW (ref 78.0–100.0)
Platelets: 184 10*3/uL (ref 150.0–400.0)
RBC: 4.29 Mil/uL (ref 3.87–5.11)
RDW: 17.1 % — ABNORMAL HIGH (ref 11.5–15.5)
WBC: 8.6 10*3/uL (ref 4.0–10.5)

## 2016-07-16 NOTE — Patient Instructions (Signed)
If you are age 81 or older, your body mass index should be between 23-30. Your Body mass index is 30.39 kg/m. If this is out of the aforementioned range listed, please consider follow up with your Primary Care Provider.  If you are age 9 or younger, your body mass index should be between 19-25. Your Body mass index is 30.39 kg/m. If this is out of the aformentioned range listed, please consider follow up with your Primary Care Provider.   Your physician has requested that you go to the basement for the following lab work before leaving today: CBC  Thank you for choosing me and [facility].  Tye Savoy, NP

## 2016-07-16 NOTE — Progress Notes (Signed)
     HPI: Patient is a an 83 yo female known to Dr. Henrene Pastor. I saw her late April following an ED visit for rectal bleeding on plavix and ASA. On day of ED visit she reported two episodes of rectal bleeding (in the absence of a BM). Hgb had fallen since mid Feb from 11.2 to 10, Repeat hgb at our office was 9.8. MCV 76. Her ferritin was low normal at 10.8.  She had internal hemorrhoids on exam, I recommended steroid preparation with close follow up. She had already stopped the plavix as recommended by Cardiology .  Patient is back with her niece. Still no further episodes of rectal bleeding since ED visit on 4/15. Remains off plavix, due to see Dr Anne Ng in a couple of weeks. She feels fine. Bowel moving normally. Appetite is good. She completed 7 days of topical steroids for hemorrhoids.    Past Medical History:  Diagnosis Date  . Anemia   . Anxiety   . Atherosclerotic heart disease   . Atrial flutter, paroxysmal ([place])   . Colon polyp   . Diverticulosis of colon   . DJD (degenerative joint disease)    "legs" (10/06/2012)  . Exertional shortness of breath   . GERD (gastroesophageal reflux disease)   . Hiatal hernia   . History of blood transfusion ? 2010  . HTN (hypertension)   . Hypercholesterolemia   . Myocardial infarction ([place]) 2010  . Pneumonia    "once; years ago" (10/06/2012)  . PVD (peripheral vascular disease) ([place])   . Vitamin D deficiency     Patient's surgical history, family medical history, social history, medications and allergies were all reviewed in Epic    Physical Exam: BP (!) 154/82 (BP Location: Left Arm, Patient Position: Sitting, Cuff Size: Normal)   Pulse 80   Ht 4' 11.5" (1.511 m)   Wt 153 lb (69.4 kg)   BMI 30.39 kg/m   GENERAL:plesant obese black female in NAD PSYCH: :Pleasant, cooperative, normal affect EENT:  conjunctiva pink, mucous membranes moist, neck supple without masses PULM: Normal respiratory effort ABDOMEN:  soft, nontender,  nondistended, no obvious masses, no hepatomegaly,  normal bowel sounds SKIN:  turgor, no lesions seen NEURO: Alert and oriented x 3, no focal neurologic deficits    ASSESSMENT and PLAN:  63. 83 year old female with multiple medical problems and self-limited rectal bleeding on plavix and asa in mid April (see details of my last office note) .No recurrent bleeding since stopping plavix.  Bleeding could have been hemorrhoidal. Due to low volume, diverticular hemorrhage seems unlikely.  -She sees Dr. Anne Ng in two weeks. If Plavix restarted then she will let us know because close monitoring of H&H will be warranted. No plans for a colonoscopy at this point  2. Microcytic (probably iron def ) anemia with ferritn of 10.8. Doubtful that the two episodes of rectal bleeding in April have led to the anemia.  Hgb has been slowly drifting since January when it was mid 13 range. I would be more concerned about a slow GI bleed, possibly from known large hiatal hernia with erosions. Hgb at last check hgb was stable at 9.8. Plavix is still on hold per Dr. Anne Ng.  -Recheck labs now. If hgb continues to decline then we will need to revisit endoscopic workup up.    3. Hx of adenomatous colon polyps (last colonoscopy in 2010). Given age, surveillance colonoscopies were discontinued.   Tye Savoy , NP 07/16/2016, 2:45 PM

## 2016-07-17 ENCOUNTER — Encounter (HOSPITAL_COMMUNITY): Payer: Self-pay

## 2016-07-19 ENCOUNTER — Encounter (HOSPITAL_COMMUNITY): Payer: Self-pay

## 2016-07-22 ENCOUNTER — Encounter (HOSPITAL_COMMUNITY): Payer: Self-pay

## 2016-07-22 ENCOUNTER — Telehealth: Payer: Self-pay | Admitting: Nurse Practitioner

## 2016-07-22 DIAGNOSIS — I480 Paroxysmal atrial fibrillation: Secondary | ICD-10-CM | POA: Diagnosis not present

## 2016-07-22 DIAGNOSIS — N189 Chronic kidney disease, unspecified: Secondary | ICD-10-CM | POA: Diagnosis not present

## 2016-07-22 DIAGNOSIS — I129 Hypertensive chronic kidney disease with stage 1 through stage 4 chronic kidney disease, or unspecified chronic kidney disease: Secondary | ICD-10-CM | POA: Diagnosis not present

## 2016-07-22 DIAGNOSIS — I25118 Atherosclerotic heart disease of native coronary artery with other forms of angina pectoris: Secondary | ICD-10-CM | POA: Diagnosis not present

## 2016-07-22 DIAGNOSIS — I252 Old myocardial infarction: Secondary | ICD-10-CM | POA: Diagnosis not present

## 2016-07-22 NOTE — Telephone Encounter (Signed)
Advised of her lab results. She has been taken off Plavix. She will take daily ASA with a meal per cardiology

## 2016-07-22 NOTE — Progress Notes (Signed)
Assessment and plans reviewed  

## 2016-07-22 NOTE — Telephone Encounter (Signed)
Patient wants copy of her cbc to her cardiologist. Faxed per her request [phone]

## 2016-07-22 NOTE — Telephone Encounter (Signed)
Patient would also like a call about her lab results from 5.15.18

## 2016-07-23 ENCOUNTER — Other Ambulatory Visit: Payer: Self-pay

## 2016-07-23 MED ORDER — FERROUS SULFATE 325 (65 FE) MG PO TABS: 325.0000 mg | ORAL_TABLET | Freq: Every day | ORAL | 3 refills | Status: AC

## 2016-07-23 NOTE — Telephone Encounter (Signed)
Please see remarks on lab results I sent to you today. Thanks

## 2016-07-24 ENCOUNTER — Encounter (HOSPITAL_COMMUNITY): Payer: Self-pay

## 2016-07-26 ENCOUNTER — Encounter (HOSPITAL_COMMUNITY)
Admission: RE | Admit: 2016-07-26 | Discharge: 2016-07-26 | Disposition: A | Discharge status: Home / Self Care | Payer: Self-pay | Source: Ambulatory Visit | Attending: Cardiology | Admitting: Cardiology

## 2016-07-31 ENCOUNTER — Encounter (HOSPITAL_COMMUNITY): Payer: Self-pay

## 2016-08-02 ENCOUNTER — Encounter (HOSPITAL_COMMUNITY): Payer: Medicare Other

## 2016-08-02 DIAGNOSIS — Z029 Encounter for administrative examinations, unspecified: Secondary | ICD-10-CM | POA: Insufficient documentation

## 2016-08-05 ENCOUNTER — Encounter (HOSPITAL_COMMUNITY): Payer: Medicare Other

## 2016-08-07 ENCOUNTER — Encounter (HOSPITAL_COMMUNITY): Payer: Medicare Other

## 2016-08-08 ENCOUNTER — Other Ambulatory Visit: Payer: Self-pay | Admitting: Pulmonary Disease

## 2016-08-09 ENCOUNTER — Encounter (HOSPITAL_COMMUNITY): Payer: Medicare Other

## 2016-08-12 ENCOUNTER — Encounter (HOSPITAL_COMMUNITY): Payer: Medicare Other

## 2016-08-14 ENCOUNTER — Encounter (HOSPITAL_COMMUNITY): Payer: Medicare Other

## 2016-08-16 ENCOUNTER — Encounter (HOSPITAL_COMMUNITY): Payer: Medicare Other

## 2016-08-19 ENCOUNTER — Encounter (HOSPITAL_COMMUNITY): Payer: Medicare Other

## 2016-08-21 ENCOUNTER — Encounter (HOSPITAL_COMMUNITY): Payer: Medicare Other

## 2016-08-23 ENCOUNTER — Encounter (HOSPITAL_COMMUNITY): Payer: Medicare Other

## 2016-08-26 ENCOUNTER — Encounter (HOSPITAL_COMMUNITY)
Admission: RE | Admit: 2016-08-26 | Discharge: 2016-08-26 | Disposition: A | Discharge status: Home / Self Care | Payer: Medicare Other | Source: Ambulatory Visit | Attending: Cardiology | Admitting: Cardiology

## 2016-08-26 NOTE — Progress Notes (Signed)
Upon arrival to cardiac rehab maintenance, irregular heart rate noted with VS assessment. Pt asymptomatic. Rhythm strip-atrial fibrillation HR-110.  Pt denies dyspnea, dizziness, fatigue,  Pain or palpitations.  Pt denies change in routine or regimen.  PC to Dr. Terrence Dupont to report rhythm and heart rate. 12 lead EKG deferred.   Per Dr. Terrence Dupont pt instructed to start metoprolol 25mg  daily.  At pt request rx will be phoned to [facility] by Dr. Zenia Resides office. Pt instructed to pick up med today and start taking this pm.   Appt scheduled to see Dr. Terrence Dupont Thursday 08/26/2016 @2 :45.  Pt instructed to call Dr. Terrence Dupont if symptoms occur sooner or call 911 for sudden severe symptoms.  Pt instructed to hold cardiac rehab until cleared by Dr. Terrence Dupont.  Pt verbalized understanding.

## 2016-08-28 ENCOUNTER — Encounter (HOSPITAL_COMMUNITY): Payer: Medicare Other

## 2016-08-29 DIAGNOSIS — I480 Paroxysmal atrial fibrillation: Secondary | ICD-10-CM | POA: Diagnosis not present

## 2016-08-29 DIAGNOSIS — I129 Hypertensive chronic kidney disease with stage 1 through stage 4 chronic kidney disease, or unspecified chronic kidney disease: Secondary | ICD-10-CM | POA: Diagnosis not present

## 2016-08-29 DIAGNOSIS — I25118 Atherosclerotic heart disease of native coronary artery with other forms of angina pectoris: Secondary | ICD-10-CM | POA: Diagnosis not present

## 2016-08-29 DIAGNOSIS — I252 Old myocardial infarction: Secondary | ICD-10-CM | POA: Diagnosis not present

## 2016-08-29 DIAGNOSIS — N189 Chronic kidney disease, unspecified: Secondary | ICD-10-CM | POA: Diagnosis not present

## 2016-08-30 ENCOUNTER — Encounter (HOSPITAL_COMMUNITY): Payer: Medicare Other

## 2016-09-02 ENCOUNTER — Encounter (HOSPITAL_COMMUNITY): Payer: Self-pay | Attending: Cardiology

## 2016-09-02 DIAGNOSIS — Z029 Encounter for administrative examinations, unspecified: Secondary | ICD-10-CM | POA: Insufficient documentation

## 2016-09-06 ENCOUNTER — Encounter (HOSPITAL_COMMUNITY): Payer: Self-pay

## 2016-09-09 ENCOUNTER — Encounter (HOSPITAL_COMMUNITY): Payer: Self-pay

## 2016-09-09 DIAGNOSIS — I129 Hypertensive chronic kidney disease with stage 1 through stage 4 chronic kidney disease, or unspecified chronic kidney disease: Secondary | ICD-10-CM | POA: Diagnosis not present

## 2016-09-09 DIAGNOSIS — I25110 Atherosclerotic heart disease of native coronary artery with unstable angina pectoris: Secondary | ICD-10-CM | POA: Diagnosis not present

## 2016-09-09 DIAGNOSIS — E785 Hyperlipidemia, unspecified: Secondary | ICD-10-CM | POA: Diagnosis not present

## 2016-09-09 DIAGNOSIS — I482 Chronic atrial fibrillation: Secondary | ICD-10-CM | POA: Diagnosis not present

## 2016-09-09 DIAGNOSIS — N189 Chronic kidney disease, unspecified: Secondary | ICD-10-CM | POA: Diagnosis not present

## 2016-09-11 ENCOUNTER — Encounter (HOSPITAL_COMMUNITY): Payer: Self-pay

## 2016-09-13 ENCOUNTER — Encounter (HOSPITAL_COMMUNITY): Payer: Self-pay

## 2016-09-16 ENCOUNTER — Encounter (HOSPITAL_COMMUNITY): Payer: Self-pay

## 2016-09-18 ENCOUNTER — Encounter (HOSPITAL_COMMUNITY): Payer: Self-pay

## 2016-09-20 ENCOUNTER — Encounter (HOSPITAL_COMMUNITY): Payer: Self-pay

## 2016-09-23 ENCOUNTER — Encounter (HOSPITAL_COMMUNITY): Payer: Self-pay

## 2016-09-25 ENCOUNTER — Encounter (HOSPITAL_COMMUNITY): Admission: RE | Admit: 2016-09-25 | Payer: Self-pay | Source: Ambulatory Visit

## 2016-09-25 DIAGNOSIS — R42 Dizziness and giddiness: Secondary | ICD-10-CM | POA: Diagnosis not present

## 2016-09-25 DIAGNOSIS — N189 Chronic kidney disease, unspecified: Secondary | ICD-10-CM | POA: Diagnosis not present

## 2016-09-25 DIAGNOSIS — E785 Hyperlipidemia, unspecified: Secondary | ICD-10-CM | POA: Diagnosis not present

## 2016-09-25 DIAGNOSIS — I129 Hypertensive chronic kidney disease with stage 1 through stage 4 chronic kidney disease, or unspecified chronic kidney disease: Secondary | ICD-10-CM | POA: Diagnosis not present

## 2016-09-25 DIAGNOSIS — I482 Chronic atrial fibrillation: Secondary | ICD-10-CM | POA: Diagnosis not present

## 2016-09-27 ENCOUNTER — Encounter (HOSPITAL_COMMUNITY): Payer: Self-pay

## 2016-09-30 ENCOUNTER — Encounter: Payer: Self-pay | Admitting: Pulmonary Disease

## 2016-09-30 ENCOUNTER — Other Ambulatory Visit (INDEPENDENT_AMBULATORY_CARE_PROVIDER_SITE_OTHER): Payer: Medicare Other

## 2016-09-30 ENCOUNTER — Ambulatory Visit (INDEPENDENT_AMBULATORY_CARE_PROVIDER_SITE_OTHER): Payer: Medicare Other | Admitting: Pulmonary Disease

## 2016-09-30 ENCOUNTER — Encounter (HOSPITAL_COMMUNITY): Payer: Self-pay

## 2016-09-30 VITALS — BP 148/100 | HR 93 | Temp 97.7°F | Wt 142.2 lb

## 2016-09-30 DIAGNOSIS — K5730 Diverticulosis of large intestine without perforation or abscess without bleeding: Secondary | ICD-10-CM | POA: Diagnosis not present

## 2016-09-30 DIAGNOSIS — F411 Generalized anxiety disorder: Secondary | ICD-10-CM

## 2016-09-30 DIAGNOSIS — E559 Vitamin D deficiency, unspecified: Secondary | ICD-10-CM | POA: Diagnosis not present

## 2016-09-30 DIAGNOSIS — M150 Primary generalized (osteo)arthritis: Secondary | ICD-10-CM | POA: Diagnosis not present

## 2016-09-30 DIAGNOSIS — E538 Deficiency of other specified B group vitamins: Secondary | ICD-10-CM | POA: Diagnosis not present

## 2016-09-30 DIAGNOSIS — I4892 Unspecified atrial flutter: Secondary | ICD-10-CM | POA: Diagnosis not present

## 2016-09-30 DIAGNOSIS — I10 Essential (primary) hypertension: Secondary | ICD-10-CM

## 2016-09-30 DIAGNOSIS — D649 Anemia, unspecified: Secondary | ICD-10-CM | POA: Diagnosis not present

## 2016-09-30 DIAGNOSIS — D126 Benign neoplasm of colon, unspecified: Secondary | ICD-10-CM | POA: Diagnosis not present

## 2016-09-30 DIAGNOSIS — M545 Low back pain: Secondary | ICD-10-CM

## 2016-09-30 DIAGNOSIS — I2510 Atherosclerotic heart disease of native coronary artery without angina pectoris: Secondary | ICD-10-CM

## 2016-09-30 DIAGNOSIS — K449 Diaphragmatic hernia without obstruction or gangrene: Secondary | ICD-10-CM

## 2016-09-30 DIAGNOSIS — M159 Polyosteoarthritis, unspecified: Secondary | ICD-10-CM

## 2016-09-30 DIAGNOSIS — G8929 Other chronic pain: Secondary | ICD-10-CM

## 2016-09-30 LAB — CBC WITH DIFFERENTIAL/PLATELET
BASOS ABS: 0.1 10*3/uL (ref 0.0–0.1)
Basophils Relative: 0.8 % (ref 0.0–3.0)
EOS PCT: 0.7 % (ref 0.0–5.0)
Eosinophils Absolute: 0 10*3/uL (ref 0.0–0.7)
HCT: 38.6 % (ref 36.0–46.0)
Hemoglobin: 11.9 g/dL — ABNORMAL LOW (ref 12.0–15.0)
LYMPHS ABS: 2.6 10*3/uL (ref 0.7–4.0)
LYMPHS PCT: 36.4 % (ref 12.0–46.0)
MCHC: 30.9 g/dL (ref 30.0–36.0)
MCV: 63.5 fl — AB (ref 78.0–100.0)
MONOS PCT: 9.2 % (ref 3.0–12.0)
Monocytes Absolute: 0.7 10*3/uL (ref 0.1–1.0)
NEUTROS PCT: 52.9 % (ref 43.0–77.0)
Neutro Abs: 3.7 10*3/uL (ref 1.4–7.7)
Platelets: 159 10*3/uL (ref 150.0–400.0)
RBC: 6.07 Mil/uL — AB (ref 3.87–5.11)
RDW: 21.1 % — ABNORMAL HIGH (ref 11.5–15.5)
WBC: 7.1 10*3/uL (ref 4.0–10.5)

## 2016-09-30 LAB — IBC PANEL
Iron: 21 ug/dL — ABNORMAL LOW (ref 42–145)
SATURATION RATIOS: 3.4 % — AB (ref 20.0–50.0)
TRANSFERRIN: 435 mg/dL — AB (ref 212.0–360.0)

## 2016-09-30 LAB — VITAMIN B12: VITAMIN B 12: 599 pg/mL (ref 211–911)

## 2016-09-30 LAB — BASIC METABOLIC PANEL
BUN: 29 mg/dL — ABNORMAL HIGH (ref 6–23)
CO2: 27 meq/L (ref 19–32)
Calcium: 9.4 mg/dL (ref 8.4–10.5)
Chloride: 106 mEq/L (ref 96–112)
Creatinine, Ser: 1.77 mg/dL — ABNORMAL HIGH (ref 0.40–1.20)
GFR: 34.69 mL/min — AB (ref >60.00)
GLUCOSE: 108 mg/dL — AB (ref 70–99)
POTASSIUM: 4.5 meq/L (ref 3.5–5.1)
SODIUM: 139 meq/L (ref 135–145)

## 2016-09-30 LAB — FERRITIN: Ferritin: 13.2 ng/mL (ref 10.0–291.0)

## 2016-09-30 NOTE — Progress Notes (Signed)
HPI  Review of Systems  Physical Exam  Subjective:    Patient ID: Virginia Casey, female    DOB: 1924-04-29, 83 y.o.   MRN: [id number]  HPI 83 y/o BF here for a follow up visit... she has mult med problems as noted below...  ~  SEE PREV EPIC NOTES FOR OLDER DATA >>    CXR 7/13 showed normal heart size, largeHH, bibasilar atx, otherw clear...  EKG 7/13 showed SBrady, rate52, no acute changes...  Myoview 8/13 at [facility] showed no ischemia or infarction, normal wall motion, EF=70%...  LABS 7/13:  Chems- wnl;  CBC- wnl...   LABS 3/14:  FLP- at goals on Cres20;  Chems- wnl;  CBC- wnl;  TSH=2.11;  VitD=12 & Rec to start 50,000u weekly supplement...   CXR 7/14 showed cardiomeg, largeHH, NAD...   PFT's were attempted but pt absolutely could not do the simple spirometry...  LABS 7/14 showed Chems- wnl, CBC- ok x plat=113K  ~  March 23, 2013:  24mo ROV & Marc reports doing well, no new complaints or concerns...    Chol> on Cres20; FLP 1/15 showed TChol 133, TG 38, HDL 75, LDL 50- we don't have notes/ labs from [facility]....    Renal insuffic> on Amlod5, Losar100, Amio200; Labs 1/15 showed BUN=42, Cr=1.8.Marland KitchenMarland Kitchen She knows to avoid NSAIDs & incr fluids...    Anemia> Hg=12.3, MCV=78; needs to restart Fe daily & stay on this... [name] prob list, meds, xrays and labs> see below for updates >>   LABS 1/15:  FLP- at goals on Cres20;  Chems- ok x renal insuffic w/ BUN=42, Cr=1.8;  CBC- ok w/ Hg=12.3, MCV=78;  TSH=3.09;  VitD=50... REC to continue Cres20, avoid NSAIDs & incr fluids, start FeSO4 daily and continue VitD50K weekly...   ~  September 20, 2013:  89mo ROV & [name] noted diarrhea x3d last week for which she took some Pepto & now much improved... She reports doing well overall at age 74 w/o other complaints or concerns...  She's been doing Cardiac rehab & really likes it...    She continues regular f/u w/ Cards- DrHarwani on Amlod5, Losar100, Amio200, ASA.Plavix; BP= 130/80 & she denies CP, palpit,  SOB, edema, or cerebral ischemic symptoms...    She remains on Cres20 & FLP 1/15 looked great...     She is clinically & biochem euthyroid w/ TSH 1/15 = 3.09...    She has mild renal insuffic w/ Labs 7/15 showing BUN= 32, Cr= 1.7 which is stable since last Sep    Hx Anemia on Fe daily> Labs 7/15 showed Hg=13.2 (up 1pt), MCV=78, Fe=73 (15%sat) and rec to continue same meds & supplements... [name] prob list, meds, xrays and labs> see below for updates >>  REC to continue same meds and supplements including the Fe...  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     AB> on Combivent prn (hasn't needed in a long time); doing well, no resp infections or exacerbations...    HBP> on Losartan100 & Amlod5; BP= 132/86 & she denies CP, palpit, SOB, edema; rec to continue same meds + diet...    CAD> followed by [name] on ASA81, Plavix75; eval 7/14 w/ abn Myoview & subseq cath 8/14 showed in-stent restenosis in LAD, s/p PCI & back to baseline...    PAFlutter>  still on Amio200 per [facility]; she denies palpit, dizzy, ch in SOB, etc...    Cerebrovasc dis> on ASA81/ Plavix75; she denies cerebral ischemic symptoms...    Chol> on Cres20; FLP 1/16 shows TChol 154, TG 62, HDL 68, LDL 74; continue diet & Cres20...    Thyroid dis> followed by [name]; she has hx hyperthyroidism secondary to Amio & a prob multinod goiter; treated w/ Tapazole, then PTU; now off PTU & "everything is fine"; Labs 1/16 showed TSH= 2.31...    GI> HH, Divertics, Polyps> on Protonix40Bid to avoid Plavix interaction; doing satis w/o abd pain, n/v, c/d, blood seen...    Renal insuffic> on Amlod5, Losar100, Amio200; Labs 1/16 showed BUN=27, Cr=1.52 (improved)... She knows to avoid NSAIDs & incr fluids...    DJD, VitD defic> on Tylenol & VitD 50K per week since labs 3/14 showed VitD level  lower at 12; f/u VitD level 1/16= 28, therefore continue 50K weekly...    Anxiety> on Alpraz0.25 prn which really helps...    Anemia> on Fe w/ VitC; Labs 1/16 showed Hg=13.2, MCV=77, Fe= needs to be repeated; rec to continue supplements daily... [name] prob list, meds, xrays and labs> see below for updates >> Given 2015 Flu vaccine today, OK refills as requested.[name]  LABS 1/16:  FLP- within parameters on Cres20;  Chems- ok x Cr=1.52;  CBC- ok x MCV=77 & Plat=112;  TSH=2.31;  VitD=28... PLAN>> Continue same meds, refills given per request; rec to continue MVI, FeSO4 daily, & VitD 50K weekly...  ~  Jul 27, 2014:  67mo ROV & add-on appt requested for "stomach issues">  Virginia Casey is c/o "my divertics are acting up" over the last week;  Notes periumbil pain off & on, ?ppt events, lasts up to 1h, relief spont; she denies n/v, d/c/bl seen, no f/c/s; her ROS is otherw neg w/o CP, palpit, SOB, cough/ phlegm, etc...       GI> HH, Divertics, Polyps> on Protonix40Bid to avoid Plavix interaction; she has a giantHH measuring 10cm w/ erosions on last EGD 4/08 by DrSam; she was Hosp 11/10 w/ lower GIB believed due to divertics- colon by DrJacobs w/ mult divertics throughout & one adenomatous polyp removed from the sigmoid colon...       EXAM shows Afeb, VSS, O2sat=99% on RA;  HEENT- neg;  Chest- clear w/o w/r/r;  Cor- RR gr1/6SEM w/o r/g;  Abd- soft, nontender, w/o masses; Ext- neg w/o c/c/e... IMP/PLAN>>  Sounds like IBS & we discussed treating her cramping pain w/ BENTYL 20mg - 1/2 to 1 tab qid as needed; if symptoms persist we will request GI consultation...   ~  September 27, 2014:  82mo ROV & [name] reports much improved "can't complain she says... She continues to f/u w/ Cards- DrHarwani every 52mo; her GI complaints have resolved w/ the Bentyl & now back to baseline... She notes some arthritis pain but Tylenol helps... She is in good spirits!    EXAM shows Afeb, VSS, O2sat=96% on RA;  HEENT- neg;  Chest- clear w/o w/r/r;   Cor- RR gr1/6SEM w/o r/g;  Abd- soft, nontender, w/o masses; Ext- neg w/o c/c/e... [name] prob list, meds, xrays and labs>   IMP/PLAN>>  Stable on current med Rx; as noted she sees Cards Q77mo so we will plan ROV in 63mo, sooner if needed prn...  ~  March 30, 2015:  50mo ROV & Laquasha has mult Orthopedic complaints- left scapula/ shoulder/ arm pain for several days, no known trauma, "arthritis Tylenol" is helping some &  we discussed Rx w/ Tramadol & refer to Ortho when she is ready;  No other complaints or concerns;  She continues to see DrHarwani Q60mo, we do not have notes from him to review;  [name] the following medical problems during today's office visit >>     AB> on Combivent prn (hasn't needed in a long time); doing well, no resp infections or exacerbations...    HBP> on Losartan100 & Amlod5; BP= 136/78 & she denies CP, palpit, SOB, edema; rec to continue same meds + diet...    CAD> followed by [name] on ASA81, Plavix75; eval 7/14 w/ abn Myoview & subseq cath 8/14 showed in-stent restenosis in LAD, s/p PCI & back to baseline, we do not have recent notes...    PAFlutter> still on Amio200 per [facility]; she denies palpit, dizzy, ch in SOB, etc...    Cerebrovasc dis> on ASA81/ Plavix75; she denies cerebral ischemic symptoms...    Chol> on Cres20; FLP 1/17 shows TChol 145, TG 62, HDL 70, LDL 63; continue diet & Cres20...    Thyroid dis> followed by [name]; she has hx hyperthyroidism secondary to Amio & a prob multinod goiter; treated w/ Tapazole, then PTU; now off PTU & "everything is fine"; Labs 1/17 showed TSH= 4.00...    GI> HH, Divertics, Polyps> on Protonix40Bid to avoid Plavix interaction; doing satis w/o abd pain, n/v, c/d, blood seen...    Renal insuffic> on Amlod5, Losar100, Amio200; Labs 1/17 showed BUN=24, Cr=1.44 (improved)... She knows to avoid NSAIDs & incr fluids...    DJD, VitD defic> on Tylenol & VitD 50K per week since labs 3/14 showed VitD level lower at 12; f/u VitD  level 1/17= 42, therefore continue 50K weekly...    Anxiety> on Alpraz0.25 prn which really helps...    Anemia> off Fe w/ VitC now; Labs 1/17 showed Hg=13.6, MCV=78, Fe= needs to be repeated; rec to continue supplements daily... EXAM shows Afeb, VSS, O2sat=98% on RA;  HEENT- neg;  Chest- clear w/o w/r/r;  Cor- RR gr1/6SEM w/o r/g;  Abd- soft, nontender, w/o masses; Ext- neg w/o c/c/e...  CXR 03/31/15>  Cardiomegaly, low lung volumes w/ basialr atx, large HH noted, osteopenia and DJD in Tspine...  LABS 03/2015>  FLP- at goals on Cres20;  Chems- wnl x Cr=1.44;  CBC- ok w/ Hg=13.6 but MCV=78;  TSH=4.00;  VitD=42 IMP/PLAN>>  Rhayne is stable on current regimen; we will add Tramadol50 prn & encourage Ortho f/u when she is ready; continue Cards f/u DrHarwani & I've asked her to get notes sent to [place]...  ~  September 27, 2015:  82mo ROV & medical follow up-- Latosha relates several problems during the 55mo interval (outlined below but feeling better now)> 1) she saw TP 07/21/15 for an acute visit w/ sinus & chest congestion, cough, incr SOB & wheezing- given ZPak, Pred, Mucinex, Hydromet & improved... 2) she fell 05/2015 (bathmat slipped) & went to the ER> evalo showed CT Head- ok but CT Cspine w/ small (10%) compression fx T1 & T2, no neuro symptoms and pain resolved w/ Tramadol; XRays of Lspine, knees, hips- all OK. 3) she continues to f/u w/ DrHarwani regularly Q41mo & he does blood work  but we do not have notes from him; she reports "everything was good & he was happy"...    Breathing is good; she has Combivent for prn use...    BP remains stable on Amlod5 & Losar100; BP=110/76 today & she denies CP, palpit, dizzy/syncope, edema...    She has  CAD, hx AFlutter, known cerebrovasc dis followed by [facility] on Amio200, ASA/Plavix... EXAM shows Afeb, VSS, O2sat=97% on RA;  HEENT- neg, mallampati2;  Chest- clear now w/o w/r/r;  Cor- RR gr1/6SEM w/o r/g;  Abd- soft, nontender, w/o masses; Ext- neg w/o c/c/e... IMP/PLAN>>   Alla is stable at 83 y/o w/ mult medical issues as listed; we reviewed her meds and discussed need for medication compliance; no changes made today & she will f/u in 1mo w/ fasting blood work.  ~  April 01, 2016:  70mo ROV & medical follow up visit... Aubrianne reports that she continues to do well- no new complaints or concerns;  She continues to f/u w/ DrHarwani but we do not have any of his records & pt is reminded to request OV notes to be sent to me...     AB> on Combivent prn (hasn't needed in a long time); doing well, no resp infections or exacerbations...    HBP> on Losartan100 & Amlod5; BP= 136/74 & she denies CP, palpit, SOB, edema; rec to continue same meds + diet...    CAD> followed by [name] on ASA81, Plavix75; eval 7/14 w/ abn Myoview & subseq cath 8/14 showed in-stent restenosis in LAD, s/p PCI & back to baseline, we do not have recent notes...    PAFlutter> still on Amio200 per [facility]; she denies palpit, dizzy, ch in SOB, etc...    Cerebrovasc dis> on ASA81/ Plavix75; she denies cerebral ischemic symptoms...    Chol> on Cres20; FLP 1/17 shows TChol 145, TG 62, HDL 70, LDL 63; continue diet & Cres20, she says DrHarwani is following FLP...    Thyroid dis> followed by [name]; she has hx hyperthyroidism secondary to Amio & a prob multinod goiter; treated w/ Tapazole, then PTU; now off PTU & "everything is fine"; Labs 1/18 shows TSH= 3.44...    GI> HH, Divertics, Polyps> on Protonix40Qd (to avoid Plavix interaction); doing satis w/o abd pain, n/v, c/d, blood seen...    Renal insuffic> on Amlod5, Losar100, Amio200; Labs 1/17 showed BUN=24, Cr=1.44 (improved);  Labs 1/18 shows Cr=1.48... She knows to avoid NSAIDs & incr fluids...    DJD, VitD defic> on Tylenol & VitD 50K per week since labs 3/14 showed VitD level lower at 12; f/u VitD level 1/17= 42, f/u 1/18 shows VitD=49; therefore continue 50K weekly...    Anxiety> on Alpraz0.25 prn which really helps...    Anemia> off Fe w/ VitC now;  Labs 1/18 showed Hg=13.6, MCV=78, Fe= needs to be repeated; rec to continue supplements daily... EXAM shows Afeb, VSS, O2sat=98% on RA;  HEENT- neg, mallampati2;  Chest- clear now w/o w/r/r;  Cor- RR gr1/6SEM w/o r/g;  Abd- soft, nontender, w/o masses; Ext- neg w/o c/c/e;  Neuro- intact w/o focal findings...  LABS 04/01/16>  Chems- ok w/ Cr=1.48;  CBC- wnl w/ Hg=13.5, Plat=116K;  TSH=3.44;  VitD=49... IMP/PLAN>>  Emagene remains stable at 83 y/o w/o new complaints or concerns; she is followed regularly for CARDS by [facility] but we do not have any of his notes to review- I explained this to the pt & requested her to authorize him to send [place] notes to be scanned into Epic... we discussed ROV in 1mo or sooner prn new problems.   ~  September 30, 2016:  93mo ROV & Mykenzie reports "not good, doctor" stating that DrHarwani (CARDs) put her on "rest mode" for several weeks- c/o SOB, DOE w/ min activ like walking & elev HR (NOTE- O2sats in office today are  all wnl & reads 99% after walking back into the exam room...    Hosp 2/10 - 04/15/16 by DrHarwani> presented to ER w/ left CP 8/10, unstable angina w/ +nuclear stress test, known CAD w/ prev PCI, HBP, HL, HxPAF; ENZ were neg, she had CATH=>patent LAD stent and disch on ASA/Plavix, Amio200, Amlod5, Losar100, res20, Protonix40, Bentyl, VitD, AlbutHFA prn...    She was seen in ER 06/16/16 w/ abd pain & BRB per rectum> Abd exam was benign, stool pos,Hg=10.0, Cr=1.71; CT Abd showed large diaph hernia w/ herniation of the stomach into a retrocardiac position & partial herniation of the transverse colon, no obstructions, right nonobstructing renal stones, +colonoc diverticulosis, mod gas, right hip prosthesis;  She was disch & told to f/u w/ CARDs re ASA/PLAVIX & f/u w/ GI regarding the blood; Cards agreed to hold ASA/Plavix...    Stina saw GI-PGuenther,NP on 06/25/16> noted prev hx GIB felt to be divertic hemorrhage- last colon 2010 showing severe diverticulosis throughout colon & one  adenomatous polyp removed; at time of OV she felt fine, no abd discomfort; Abd exam was neg; no mention of the large [place] on CT-  they gave her [place] & checked Labs (Hg=9.8, mcv=76, Fe=15 3%sat, Ferritin=10.8); Pt asked to start FeSO4 325mg /d...    We do not have notes from [facility] (Cards)> she has been in Cardiac Rehab grad program> 08/26/16 noted to have irreg rhythm, EKG showed AFib rate110, pt was asymptomatic, placed on Metoprolol25mg /d by [facility] and pt indicates that he didn't restart ASA/Plavix but opted for Eliquis2.5Bid & has since stopped the Amlodine, increased the MetoprololER to 50mg /d, increased Amio to 200mg Bid ("He changed a lot of my meds")...  [name] the following medical problems during today's office visit >>     AB> on Combivent prn (hasn't needed in a long time); doing well, no resp infections or exacerbations...    HBP> on [place]; BP= 148/100 & she denies CP, palpit, SOB, edema; rec to continue same meds + diet, no salt; she will f/u w/ [facility] for further med adjust...    CAD> followed by [facility] now on Eliquis2.5Bid; eval 7/14 w/ abn Myoview & subseq cath 8/14 showed in-stent restenosis in LAD, s/p PCI & back to baseline; subseq re-cath 2/18 w/ patent stent    PAFib/Flutter>  on Amio200 now incr to Bid per [facility]; she denies palpit, dizzy, ch in SOB, etc...    Cerebrovasc dis> off prev ASA/Plavix & on Eliquis2.5Bid; she denies cerebral ischemic symptoms...    Chol> on Cres20; FLP 2/18 shows TChol 125, TG 24, HDL 65, LDL 55; continue diet & Cres20, she says DrHarwani is following FLP...    Thyroid dis> followed by [name]; she has hx hyperthyroidism secondary to Amio & a prob multinod goiter; treated w/ Tapazole, then PTU; now off PTU & "everything is fine"; Labs 1/18 shows TSH= 3.44...    GI> HH, Divertics, Polyps> on Protonix40Qd; Hx abd pain, GIB, known large HH, severe divertics, colon polyps; doing satis now w/o abd pain, n/v, c/d, blood  seen...    Renal insuffic> on MetopER50, Losar100, Amio200Bid; Labs 7/18 showed BUN=29, Cr=1.77;  She knows to avoid NSAIDs & incr fluids...    DJD, VitD defic> on Tylenol & VitD 50K per week since labs 3/14 showed VitD level lower at 12; f/u VitD level 1/17= 42, f/u 1/18 shows VitD=49; therefore continue 50K weekly...    Anxiety> on Alpraz0.25 prn which really helps...    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 EXAM shows Afeb, VSS but BP=148/100, O2sat=99% on RA;  HEENT- neg, mallampati2;  Chest- clear now w/o w/r/r;  Cor- RR gr1/6SEM w/o r/g;  Abd- soft, nontender, w/o masses; Ext- neg w/o c/c/e;  Neuro- intact...   LABS 09/30/16> Chems- ok x BUN=29, Cr=1.77;  CBC- ok x Hg=11.9, mcv=63; Fe=21 (3.4%sat); Ferritin=13.2;  B12=599 IMP/PLAN>>  Verlisa is 83 y/o w/ multisystem disease- Cards managed by [facility] & she likes it that way but we do not have notes from him & we rely on her history regarding med changes;  GI is followed by [facility] but give her age & comorbidities they do not want to be aggressive or perform additional procedures, they rec oral Fe but she is clearly not absorbing this med;  We have decided to Rx w/ IV Feraheme x 2 doses now & plan ROV recheck in 61mo to recheck levels;  She also has renal insuffic & knows to avoid NSAIDs and drink plenty of fluids...  NOTE: >50% of this 65min rov was spent in counseling 7 coordination of care...           Problem List:  Hearing Loss >> she saw DrCrossley 3/13 w/ a high freq hearing loss & he removed creumen impactions' they are holding off on hearing aides for now...  ASTHMATIC BRONCHITIS (ICD-466.0) - breathing is stable now- s/p recent exacerbation... ~  CXR 1/12 showed basilar atx & large HH, cardiomeg,  osteopenia, NAD... ~  [facility] 5/12 w/ refractory AB exac & improved w/ standard therapy & brief course of Pred; CXR- similar, NAD.[name]. ~  CXR 7/13 showed norm heart size, large HH, clear lungs, NAD.[name]. ~  7/14:  She presented w/ a mild exac brought on by the stress of a trip to [place] etc=> Rx w/ COMBIVENT every 6h as needed... ~  CXR 7/14 showed cardiomeg, largeHH, NAD... We attempted PFTs but she was unable to perform... Os sats were 98% on RA... ~  She remains stable, denies breathing problems and hasn't needed the Combivent...  HYPERTENSION (ICD-401.9) - now on LOSARTAN 100mg /d & AMLODIPINE 5mg /d (and off her prev Ramipril due to asthma & Imdur due to HA) ...  ~  8/12:  BP= 110/72 & not checking BP's at home; she denies visual changes, CP, palipit, syncope, dyspnea, edema, etc... ~  2/13:  BP= 122/82 & she is stable, mostly asymptomatic w/o CP, palpit, SOB, etc... ~  8/13:  BP= 124/86 & she remains stable... ~  3/14: on Losartan100 & off Amlod5; BP= 130/84 & she denies CP, palpit, SOB, edema; rec to continue same meds + diet. ~  7/14: on Losartan100 & off Amlod5; BP= 132/92 & she denies CP, palpit, or edema... ~  10/14: now on Losartan100 + Amlodipine5; BP= 118/88 & she remains asymptomatic... ~  7/15: on Amlod5, Losar100, Amio200, ASA.Plavix; BP= 130/80 & she denies CP, palpit, SOB, edema, or cerebral ischemic symptoms. ~  1/16: on Losartan100 & Amlod5; BP= 132/86 & she denies CP, palpit, SOB, edema; rec to continue same meds + diet. ~  5/16: on Losartan100 & Amlod5; BP= 110/70 & she remains asymptomatic.  ATHEROSCLEROTIC HEART DISEASE (ICD-414.00) & PERIPHERAL VASCULAR DISEASE (ICD-443.9) - regularly on ASA 81mg /d, & off Plavix... followed by [facility]  for Cards (we do not have his notes to review med changes)>> ~  hosp in Jan08 w/ syncope and MRI Brain showed mild atrophy & sm vessel dis;  MRA showed basilar art narrowing & mod stenosis in RICA cavernous segment... CDopplers however  were WNL w/ antegrade vertebral flow, and no signif ICA stenoses detected... ~  hosp Feb10 by [facility] w/ non-STEMI- s/p PTCA and stent in the LAD. ~  hosp 7/11 by [facility] w/ CP, Abn Myoview & cath w/ 70% midLAD in-stent resteosis; s/p PTCA & started back on Plavix... ~  she is in Cardiac Rehab & really likes it... ~  7/13:  She went to the ER w/ chest discomfort; ruled out for ischemia (EKG= SBrday, rate52, wnl/NAD) & followed up w/ DrHarwani> he did MYOVIEW 8/13 which was neg for ischemia or infarction, norm wall motion & EF=70%... ~  7/14:  She continues to f/u w/ [facility] her cardiologist;  She will see him soon to f/u from this [place] trip for additional test => Myoview was Abn & Cath 8/14 w/ in-stent restenosis, s/p PCI... ~  10/14: now on ASA81 + Plavix75 and enrolled in cardiac rehab which she loves... (Note: Omep20Bid give via ER 9/14 is being changed to Protonix40Bid due to her Plavix Rx)... ~  She continues to f/u regularly w/ DrHarwani (we do not have notes from him...  ATRIAL FLUTTER, PAROXYSMAL (ICD-427.32) - this occured 2/10 following her cath, PTCA, stent... he diagnosed flutter w/ 2:1 block... currently taking AMIODARONE 200mg /d... ~  She remains in NSR & denies CP, palpit, SOB, etc...  HYPERCHOLESTEROLEMIA (ICD-272.0) - on CRESTOR 20mg /d... ~  [place] 11/08 showed TChol 161, TG 39, HDL 62, LDL 92... continue same med. ~  [place] 5/09 showed TChol 209, TG 70, HDL 74, LDL 124... rec- better diet, get weight down! ~  FLP in hosp 2/10 showed TChol 130, TG 69, HDL 45, LDL 71 ~  she reports that [facility] does fasting labs... ~  FLP 7/11 in hosp showed TChol 105, TG 30, HDL 66, LDL 33 ~  FLP 2/12 on Cres20 showed TChol 97, TG 38, HDL 50, LDL 39... copy to Cards ?decr to 10mg ? ~  FLP 2/13 on Cres20 showed TChol 118, TG 42, HDL 60, LDL 50... Continue same. ~  [place] 3/14 on Cres20 showed TChol 128, TG 32, HDL 68, LDL 53  ~  FLP 1/15 on Cres20 showed TChol 133, TG 38, HDL 75, LDL 50 ~  FLP  1/16 on Cres20 showed TChol 154, TG 62, HDL 68, LDL 74  THYROID DISEASE >> Hx Amiodarone induced hyperthyroidism diagnosed 2/12 & referred to [facility], treated w/ PTU (now off) & DrHarwani tapered the Amio... ~  1/13:  She had f/u DrKumar> everything improve4d w/ reduction in Amio dose; clinically euthyroid & no goiter palpated; TFT's in his office were WNL==> TSH=3.74,  FreeT3=2.4 (2.0-4.4), FreeT4=0.91 (0.61-1.12) ~  Labs 3/14 showed TSH= 2.11 ~  Labs 1/15 showed TSH= 3.09 ~  Labs 1/16 showed TSH= 2.31  HIATAL HERNIA (ICD-553.3) - prev on Nexium & Carafate Liq- but changed to PEPCID 20mg Bid by [facility] due to need for Plavix... she has a giant HH measuring 10cm per DrSam, w/ erosions in the pouch... last EGD by DrSam 4/08... hernia is seen on her CXRs. ~  9/14: she went to the ER w/ Epig pain & they felt it was reflux related due to her [place]; they switched Pepcid to Omep20Bid but she will need to change this to [place] due  to the Plavix...  DIVERTICULOSIS OF COLON (ICD-562.10) & COLONIC POLYPS (ICD-211.3) ~  colonoscopy 11/06 showed divertics and 2- 56mm polyps... f/u planned in 4-66yrs... ~  Hosp 11/10 w/ lower GIB believed due to divertics... colonoscopy by DrJacobs showed severe divertics, no bleeding site, 35mm polyp (tubular adenoma) removed from sigmoid...  RENAL INSUFFICIENCY >> she knows to avoid NSAIDs and incr fluid intake... ~  Labs 1/15 showed BUN= 42, Cr= 1.8 ~  Labs 7/15 showed BUN= 32, Cr= 1.7 ~  Labs 1/16 showed BUN= 27, Cr= 1.52  DEGENERATIVE JOINT DISEASE (ICD-715.90) & HIP PAIN, RIGHT (ICD-719.45) - currently taking OTC meds only but DrDean rec Vicodin for as needed use (she prefers Tylenol)... Eval by DrDean 3/09 w/ bone-on-bone right hip, tried injection & pain meds... right THR done 12/09...  VITAMIN D DEFICIENCY (ICD-268.9) ~  labs 5/09 showed Vit D level = 9... pt started on 50K weekly but she switched on her own to 1000u/d OTC. ~  labs 2/12 showed Vit D level  = 18... asked to incr Vit D supplement to 2000 u daily (she never did). ~  Labs 2/13 showed Vit D level = 20... Asked to finally incr VitD supplement to 2000u daily... ~  Labs 3/14 showed Vit D level = 12 & Rec to switch to 50K weekly Rx... ~  Labs 1/15 showed VitD level = 50 & rec to continue VitD 50K weekly... ~  Labs 1/16 showed VitD level = 28 & reminded to take the VitD 50K weekly...  ANXIETY (ICD-300.00) - she is requesting a mild nerve pill & we have written ALPRAZOLAM 0.25mg  Prn use...  ANEMIA (ICD-285.9) - prev on Niferex 150mg /d, now FeSO4 325mg /d w/ Vit C 500mg ... 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 ~  last blood count 11/08 showed Hg=13.7, Fe=56... ~  labs 5/09 showed Hg= 14.2.[name]. ~  labs 2/10 in hosp showed Hg= 13 ~  Hosp 11/10 w/ lower GIB, & Hg down to 8.6- tc 1u PC up to 9.3.Marland KitchenMarland Kitchen ~  labs 12/10 showed Hg= 10.6.[name]. rec> continue oral Fe w/ VitC... ~  [facility] labs 7/11 showed Hg= 11.6 - 10.5, MCV= 70... rec> restart Fe. ~  labs 2/12 showed Hg= 12.4, MCV= 77, Fe= 56 (sat=10%)... continue Fe w/ VitC. ~  Labs 5/12 in hosp showed Hg= 12.3, MCV= 68, & she continues on FeSO4 daily w/ VitC... ~  Labs 2/13 showed Hg=13.1, MCV=78, Fe=66; rec to continue supplements... ~  Labs 3/14 showed Hg= 13.1 ~  Labs 9/14 showed Hg= 12.8, MCV=74 ~  Labs 1/15 showed Hg= 12.3, MCV=78 and rec to restart FeSO4 325mg  daily... ~  Labs 7/15 showed Hg= 13.2, MCV=78, Fe=73 (15%sat)... rec to continue supplements. ~  Labs 1/16 showed Hg= 13.2, MCV=77, and reminded to continue daily supplements.  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 ~  Immuniz:  She had TDAP 03/2008;  Pneumovax-23 in 05/2009;  Given yearly Flu vaccines...    Past Surgical History:  Procedure Laterality Date  . CORONARY ANGIOPLASTY  10/06/2012  . CORONARY ANGIOPLASTY WITH STENT PLACEMENT  04/2008   "1; Dr Terrence Dupont" (10/06/2012)  . DILATION AND CURETTAGE OF UTERUS     . LEFT HEART CATH AND CORONARY ANGIOGRAPHY N/A 04/15/2016   Procedure: Left Heart Cath and Coronary Angiography;  Surgeon: Charolette Forward, MD;  Location: [facility];  Service: Cardiovascular;  Laterality: N/A;  . LEFT HEART CATHETERIZATION  WITH CORONARY ANGIOGRAM N/A 10/06/2012   Procedure: LEFT HEART CATHETERIZATION WITH CORONARY ANGIOGRAM;  Surgeon: Clent Demark, MD;  Location: [facility] CATH LAB;  Service: Cardiovascular;  Laterality: N/A;  . TOTAL HIP ARTHROPLASTY Right 12/09   Dr Marlou Sa    Outpatient Encounter Prescriptions as of 09/30/2016  Medication Sig  . acetaminophen (TYLENOL) 325 MG tablet Take 325 mg by mouth every 6 (six) hours as needed. For pain  . ALPRAZolam (XANAX) 0.25 MG tablet Take 0.25 mg by mouth 3 (three) times daily as needed for anxiety.  [name] amiodarone (PACERONE) 200 MG tablet Take 200 mg by mouth daily.   [name] apixaban (ELIQUIS) 2.5 MG TABS tablet Take 2.5 mg by mouth 2 (two) times daily.  [name] dicyclomine (BENTYL) 20 MG tablet Take 1/2-1 tablet by mouth up to 4 times a day as needed for abdominal cramping  . ferrous sulfate (IRON SUPPLEMENT) 325 (65 FE) MG tablet Take 1 tablet (325 mg total) by mouth daily with breakfast.  . losartan (COZAAR) 100 MG tablet Take 1 tablet (100 mg total) by mouth daily.  . metoprolol succinate (TOPROL-XL) 50 MG 24 hr tablet Take 50 mg by mouth daily. Take with or immediately following a meal.  . nitroGLYCERIN (NITROSTAT) 0.4 MG SL tablet Place 0.4 mg under the tongue every 5 (five) minutes as needed. May repeat x3   . pantoprazole (PROTONIX) 40 MG tablet take 1 tablet by mouth twice a day  . rosuvastatin (CRESTOR) 20 MG tablet Take 20 mg by mouth at bedtime.   . Vitamin D, Ergocalciferol, (DRISDOL) 50000 units CAPS capsule take 1 capsule by mouth every week  . amLODipine (NORVASC) 5 MG tablet Take 5 mg by mouth daily.  [name] aspirin 81 MG tablet Take 324 mg by mouth daily.    No facility-administered encounter medications on file as of  09/30/2016.     No Known Allergies    Immunization History  Administered Date(s) Administered  . Influenza Split 02/08/2011, 01/02/2012  . Influenza Whole 12/09/2007, 09/20/2009  . Influenza, High Dose Seasonal PF 01/16/2016  . Influenza,inj,Quad PF,36+ Mos 12/21/2012, 03/23/2014, 04/21/2015  . Pneumococcal Polysaccharide-23 05/23/2009  . Tdap 03/04/2008    Current Medications, Allergies, Past Medical History, Past Surgical History, Family History, and Social History were reviewed in [employer] record.   Review of Systems         See HPI - all other systems neg except as noted... The patient complains of dyspnea on exertion.  The patient denies anorexia, fever, weight loss, weight gain, vision loss, decreased hearing, hoarseness, chest pain, syncope, peripheral edema, prolonged cough, headaches, hemoptysis, abdominal pain, melena, hematochezia, severe indigestion/heartburn, hematuria, incontinence, muscle weakness, suspicious skin lesions, transient blindness, difficulty walking, depression, unusual weight change, abnormal bleeding, enlarged lymph nodes, and angioedema.     Objective:   Physical Exam     WD, WN, 83 y/o BF in NAD... GENERAL:  Alert & oriented; pleasant & cooperative... HEENT:  [place]/AT, EOM-wnl, EACs-clear, TMs-wnl, NOSE-clear, THROAT-clear & wnl. NECK:  Supple w/ fairROM; no JVD; normal carotid impulses w/o bruits; no thyromegaly or nodules palpated; no lymphadenopathy. CHEST:  Coarse BS w/ no wheezing, no rales, no signs of consolidation. HEART:  Regular Rhythm; gr 1/6 SEM without rubs or gallops detected... ABDOMEN:  Soft & nontender; normal bowel sounds; no organomegaly or masses palpated... EXT: without deformities, mod arthritic changes; s/p right THR; no varicose veins/ +venous insuffic/  tr edema . BACK:  sl tender along right PSIS area... NEURO:  CN's intact; no focal neuro defict... DERM:  no lesions seen...  RADIOLOGY DATA:   Reviewed in the EPIC EMR & discussed w/ the patient...  LABORATORY DATA:  Reviewed in the EPIC EMR & discussed w/ the patient...     Assessment & Plan:    09/30/16>   Doneisha is 83 y/o w/ multisystem disease- Cards managed by [facility] & she likes it that way but we do not have notes from him & we rely on her history regarding med changes;  GI is followed by [facility] but give her age & comorbidities they do not want to be aggressive or perform additional procedures, they rec oral Fe but she is clearly not absorbing this med;  We have decided to Rx w/ IV Feraheme x 2 doses now & plan ROV recheck in 47mo to recheck levels;  She also has renal insuffic & knows to avoid NSAIDs and drink plenty of fluids...    AB>  Improved overall & she stopped all prev meds on her own=> using the COMBIVENT respimat prn but hasn't needed... 04/01/16>   Madigan remains stable at 83 y/o w/o new complaints or concerns; she is followed regularly for CARDS by [facility] but we do not have any of his notes to review- I explained this to the pt & requested her to authorize him to send [place] notes to be scanned into Epic  HBP>  Good control on the Losartan & Amlodipine; asked to monitor BP at home...  ASHD>  Followed by [name] for Cards> cath 8/14 w/ LAD instent restenosis, s/p successful PCI & she is back to baseline; Note: ER 9/14 switched her Pepcid to Omep20Bid but her Plavix is critically important to help patency of the LAD stent; therefore we will switch the Omep to [id number]... ~  Stable on meds now without recent problems or need for med adjustments...  AFlutter>  DrHarwani has her on Amio 200mg  daily; her prev thyroid problems have resolved...  CHOL>  On Cres20 w/ good control of FLP...  THYROID DISEASE>  Prev Amio induced hyperthyroidism managed by DrKumar (on PTU prev) & resolved on lower dose of Amio...  GI>  Hx HH/ GERD/ Divertics/ Polyps>  She is followed by DrJacobs for GI as above; current symptoms  sound like IBS=> BENTYL20- 1/2 to 1 tab Qid helped...   GU> mild renal insuffic is stable w/ Cr=1.7-1.6 on current meds...  DJD>  She is followed by DrDean for Ortho...  VitD defic>  She remains on VitD 50K weekly, reminded to take it every wk...  Anemia>  Hg is OK but MCV is low, Fe was 73 (improved) & she will continue her Fe & VitC supplements...  Anxiety>  Requesting a mild nerve pill & we wrote for Alprazolam...   Patient's Medications  New Prescriptions   No medications on file  Previous Medications   ACETAMINOPHEN (TYLENOL) 325 MG TABLET    Take 325 mg by mouth every 6 (six) hours as needed. For pain   ALPRAZOLAM (XANAX) 0.25 MG TABLET    Take 0.25 mg by mouth 3 (three) times daily as needed for anxiety.   AMIODARONE (PACERONE) 200 MG TABLET    Take 200 mg by mouth daily.    AMLODIPINE (NORVASC) 5 MG TABLET    Take 5 mg by mouth daily.   APIXABAN (ELIQUIS) 2.5 MG TABS TABLET    Take 2.5 mg by mouth 2 (two) times daily.   ASPIRIN 81 MG TABLET    Take 324  mg by mouth daily.    DICYCLOMINE (BENTYL) 20 MG TABLET    Take 1/2-1 tablet by mouth up to 4 times a day as needed for abdominal cramping   FERROUS SULFATE (IRON SUPPLEMENT) 325 (65 FE) MG TABLET    Take 1 tablet (325 mg total) by mouth daily with breakfast.   LOSARTAN (COZAAR) 100 MG TABLET    Take 1 tablet (100 mg total) by mouth daily.   METOPROLOL SUCCINATE (TOPROL-XL) 50 MG 24 HR TABLET    Take 50 mg by mouth daily. Take with or immediately following a meal.   NITROGLYCERIN (NITROSTAT) 0.4 MG SL TABLET    Place 0.4 mg under the tongue every 5 (five) minutes as needed. May repeat x3    PANTOPRAZOLE (PROTONIX) 40 MG TABLET    take 1 tablet by mouth twice a day   ROSUVASTATIN (CRESTOR) 20 MG TABLET    Take 20 mg by mouth at bedtime.    VITAMIN D, ERGOCALCIFEROL, (DRISDOL) 50000 UNITS CAPS CAPSULE    take 1 capsule by mouth every week  Modified Medications   No medications on file  Discontinued Medications   No medications  on file

## 2016-09-30 NOTE — Patient Instructions (Signed)
Today we updated your med list in our EPIC system...    Continue your current medications the same...  Today we rechecked your bloodwork & Iron levels...    We will contact you w/ the results when available...     If the IRON is low we will arrange for IV iron therapy as an oupt...  We discussed taking the Xanax (Alprazolam) 0.5mg  tasbs at 1/2 tab 3 times daily on a regular dosing interval (morning, afternoon, & evening) to help w/ the stress, BP, and heart rate...  Call for any questions...  Let's plan a follow up visit in 93mo, sooner if needed for problems.Marland KitchenMarland Kitchen

## 2016-10-02 ENCOUNTER — Encounter (HOSPITAL_COMMUNITY): Payer: Medicare Other

## 2016-10-02 DIAGNOSIS — Z029 Encounter for administrative examinations, unspecified: Secondary | ICD-10-CM | POA: Insufficient documentation

## 2016-10-04 ENCOUNTER — Encounter (HOSPITAL_COMMUNITY): Payer: Medicare Other

## 2016-10-07 ENCOUNTER — Telehealth: Payer: Self-pay | Admitting: Pulmonary Disease

## 2016-10-07 ENCOUNTER — Encounter (HOSPITAL_COMMUNITY): Payer: Medicare Other

## 2016-10-07 NOTE — Telephone Encounter (Signed)
Not able to discuss pt's health information with Tamela Oddi since she is not listed on DPR  Spoke with her and made her aware of this and she verbalized understanding

## 2016-10-07 NOTE — Telephone Encounter (Signed)
Called and spoke with pt and she is aware of the order that has been sent over to short stay to get the pt set up for fereheme infusions.  Pt is aware that she will have to have 2 infusions 1 week apart.  Pt was given the number to short stay and she will call to get this set up.

## 2016-10-08 ENCOUNTER — Telehealth: Payer: Self-pay | Admitting: Pulmonary Disease

## 2016-10-08 NOTE — Telephone Encounter (Signed)
Spoke with the pt  She is requesting that we send Dr Terrence Dupont copy of her recent labs  I have faxed these through Epic  Nothing further needed per pt

## 2016-10-09 ENCOUNTER — Encounter (HOSPITAL_COMMUNITY): Payer: Medicare Other

## 2016-10-11 ENCOUNTER — Encounter (HOSPITAL_COMMUNITY): Payer: Medicare Other

## 2016-10-14 ENCOUNTER — Encounter (HOSPITAL_COMMUNITY): Payer: Medicare Other

## 2016-10-16 ENCOUNTER — Encounter (HOSPITAL_COMMUNITY): Payer: Medicare Other

## 2016-10-18 ENCOUNTER — Encounter (HOSPITAL_COMMUNITY): Payer: Medicare Other

## 2016-10-21 ENCOUNTER — Encounter (HOSPITAL_COMMUNITY): Payer: Medicare Other

## 2016-10-22 ENCOUNTER — Other Ambulatory Visit (HOSPITAL_COMMUNITY): Payer: Self-pay | Admitting: *Deleted

## 2016-10-23 ENCOUNTER — Encounter (HOSPITAL_COMMUNITY): Payer: Medicare Other

## 2016-10-23 ENCOUNTER — Ambulatory Visit (HOSPITAL_COMMUNITY)
Admission: RE | Admit: 2016-10-23 | Discharge: 2016-10-23 | Disposition: A | Discharge status: Home / Self Care | Payer: Medicare Other | Source: Ambulatory Visit | Attending: Pulmonary Disease | Admitting: Pulmonary Disease

## 2016-10-23 DIAGNOSIS — D649 Anemia, unspecified: Secondary | ICD-10-CM | POA: Diagnosis not present

## 2016-10-23 MED ORDER — SODIUM CHLORIDE 0.9 % IV SOLN
510.0000 mg | INTRAVENOUS | Status: DC
  Administered 2016-10-23: 510 mg via INTRAVENOUS
  Filled 2016-10-23: qty 17

## 2016-10-23 NOTE — Progress Notes (Addendum)
Note documented on wrong patient.

## 2016-10-23 NOTE — Discharge Instructions (Signed)

## 2016-10-25 ENCOUNTER — Encounter (HOSPITAL_COMMUNITY): Payer: Medicare Other

## 2016-10-28 ENCOUNTER — Encounter (HOSPITAL_COMMUNITY): Payer: Medicare Other

## 2016-10-29 ENCOUNTER — Telehealth: Payer: Self-pay | Admitting: Pulmonary Disease

## 2016-10-29 NOTE — Telephone Encounter (Signed)
Called and spoke with pt and she stated that she wanted to make sure that she was to have a second infusion of the fereheme.  I advised her to do the infusion this week and she will stay on the daily iron tablet and she will keep her appt with SN on 9/24 for follow up.

## 2016-10-30 ENCOUNTER — Encounter (HOSPITAL_COMMUNITY): Payer: Medicare Other

## 2016-10-30 DIAGNOSIS — I252 Old myocardial infarction: Secondary | ICD-10-CM | POA: Diagnosis not present

## 2016-10-30 DIAGNOSIS — I129 Hypertensive chronic kidney disease with stage 1 through stage 4 chronic kidney disease, or unspecified chronic kidney disease: Secondary | ICD-10-CM | POA: Diagnosis not present

## 2016-10-30 DIAGNOSIS — N189 Chronic kidney disease, unspecified: Secondary | ICD-10-CM | POA: Diagnosis not present

## 2016-10-30 DIAGNOSIS — I2510 Atherosclerotic heart disease of native coronary artery without angina pectoris: Secondary | ICD-10-CM | POA: Diagnosis not present

## 2016-10-30 DIAGNOSIS — I482 Chronic atrial fibrillation: Secondary | ICD-10-CM | POA: Diagnosis not present

## 2016-10-31 ENCOUNTER — Encounter (HOSPITAL_COMMUNITY)
Admission: RE | Admit: 2016-10-31 | Discharge: 2016-10-31 | Disposition: A | Discharge status: Home / Self Care | Payer: Medicare Other | Source: Ambulatory Visit | Attending: Pulmonary Disease | Admitting: Pulmonary Disease

## 2016-10-31 DIAGNOSIS — Z029 Encounter for administrative examinations, unspecified: Secondary | ICD-10-CM | POA: Diagnosis not present

## 2016-10-31 MED ORDER — SODIUM CHLORIDE 0.9 % IV SOLN
510.0000 mg | INTRAVENOUS | Status: DC
  Administered 2016-10-31: 10:00:00 510 mg via INTRAVENOUS
  Filled 2016-10-31: qty 17

## 2016-11-01 ENCOUNTER — Encounter (HOSPITAL_COMMUNITY): Payer: Medicare Other

## 2016-11-06 ENCOUNTER — Encounter (HOSPITAL_COMMUNITY): Payer: Medicare Other | Attending: Cardiology

## 2016-11-06 DIAGNOSIS — Z029 Encounter for administrative examinations, unspecified: Secondary | ICD-10-CM | POA: Insufficient documentation

## 2016-11-08 ENCOUNTER — Encounter (HOSPITAL_COMMUNITY): Payer: Medicare Other

## 2016-11-11 ENCOUNTER — Encounter (HOSPITAL_COMMUNITY): Payer: Medicare Other

## 2016-11-13 ENCOUNTER — Encounter (HOSPITAL_COMMUNITY): Payer: Medicare Other

## 2016-11-15 ENCOUNTER — Encounter (HOSPITAL_COMMUNITY): Payer: Medicare Other

## 2016-11-18 ENCOUNTER — Encounter (HOSPITAL_COMMUNITY): Payer: Medicare Other

## 2016-11-20 ENCOUNTER — Encounter (HOSPITAL_COMMUNITY): Payer: Medicare Other

## 2016-11-22 ENCOUNTER — Encounter (HOSPITAL_COMMUNITY): Payer: Medicare Other

## 2016-11-25 ENCOUNTER — Other Ambulatory Visit (INDEPENDENT_AMBULATORY_CARE_PROVIDER_SITE_OTHER): Payer: Medicare Other

## 2016-11-25 ENCOUNTER — Ambulatory Visit (INDEPENDENT_AMBULATORY_CARE_PROVIDER_SITE_OTHER): Payer: Medicare Other | Admitting: Pulmonary Disease

## 2016-11-25 ENCOUNTER — Encounter (HOSPITAL_COMMUNITY): Payer: Medicare Other

## 2016-11-25 ENCOUNTER — Other Ambulatory Visit: Payer: Medicare Other

## 2016-11-25 VITALS — BP 128/68 | HR 97 | Temp 97.9°F | Wt 145.1 lb

## 2016-11-25 DIAGNOSIS — M545 Low back pain, unspecified: Secondary | ICD-10-CM

## 2016-11-25 DIAGNOSIS — I2510 Atherosclerotic heart disease of native coronary artery without angina pectoris: Secondary | ICD-10-CM

## 2016-11-25 DIAGNOSIS — K5730 Diverticulosis of large intestine without perforation or abscess without bleeding: Secondary | ICD-10-CM

## 2016-11-25 DIAGNOSIS — M150 Primary generalized (osteo)arthritis: Secondary | ICD-10-CM

## 2016-11-25 DIAGNOSIS — D508 Other iron deficiency anemias: Secondary | ICD-10-CM | POA: Diagnosis not present

## 2016-11-25 DIAGNOSIS — D649 Anemia, unspecified: Secondary | ICD-10-CM | POA: Diagnosis not present

## 2016-11-25 DIAGNOSIS — I4892 Unspecified atrial flutter: Secondary | ICD-10-CM | POA: Diagnosis not present

## 2016-11-25 DIAGNOSIS — F411 Generalized anxiety disorder: Secondary | ICD-10-CM

## 2016-11-25 DIAGNOSIS — K449 Diaphragmatic hernia without obstruction or gangrene: Secondary | ICD-10-CM | POA: Diagnosis not present

## 2016-11-25 DIAGNOSIS — G8929 Other chronic pain: Secondary | ICD-10-CM

## 2016-11-25 DIAGNOSIS — E559 Vitamin D deficiency, unspecified: Secondary | ICD-10-CM

## 2016-11-25 DIAGNOSIS — D126 Benign neoplasm of colon, unspecified: Secondary | ICD-10-CM | POA: Diagnosis not present

## 2016-11-25 DIAGNOSIS — I10 Essential (primary) hypertension: Secondary | ICD-10-CM

## 2016-11-25 DIAGNOSIS — M159 Polyosteoarthritis, unspecified: Secondary | ICD-10-CM

## 2016-11-25 LAB — CBC WITH DIFFERENTIAL/PLATELET
Basophils Absolute: 0.1 10*3/uL (ref 0.0–0.1)
Basophils Relative: 1.2 % (ref 0.0–3.0)
Eosinophils Absolute: 0.1 10*3/uL (ref 0.0–0.7)
HEMATOCRIT: 42.8 % (ref 36.0–46.0)
HEMOGLOBIN: 13.3 g/dL (ref 12.0–15.0)
Lymphs Abs: 2.7 10*3/uL (ref 0.7–4.0)
MCHC: 31.2 g/dL (ref 30.0–36.0)
Monocytes Absolute: 0.6 10*3/uL (ref 0.1–1.0)
Monocytes Relative: 7.2 % (ref 3.0–12.0)
Neutro Abs: 4.7 10*3/uL (ref 1.4–7.7)
Neutrophils Relative %: 57.6 % (ref 43.0–77.0)
Platelets: 74 10*3/uL — ABNORMAL LOW (ref 150.0–400.0)
RBC: 6.25 Mil/uL — AB (ref 3.87–5.11)
RDW: 33.4 % — ABNORMAL HIGH (ref 11.5–15.5)
WBC: 8.1 10*3/uL (ref 4.0–10.5)

## 2016-11-25 LAB — BASIC METABOLIC PANEL
BUN: 32 mg/dL — AB (ref 6–23)
CALCIUM: 9.5 mg/dL (ref 8.4–10.5)
CO2: 29 mEq/L (ref 19–32)
Chloride: 107 mEq/L (ref 96–112)
Creatinine, Ser: 2.02 mg/dL — ABNORMAL HIGH (ref 0.40–1.20)
GFR: 29.78 mL/min — AB (ref >60.00)
GLUCOSE: 97 mg/dL (ref 70–99)
Potassium: 4.5 mEq/L (ref 3.5–5.1)
Sodium: 144 mEq/L (ref 135–145)

## 2016-11-25 LAB — IBC PANEL
Iron: 65 ug/dL (ref 42–145)
SATURATION RATIOS: 18 % — AB (ref 20.0–50.0)
TRANSFERRIN: 258 mg/dL (ref 212.0–360.0)

## 2016-11-25 LAB — FERRITIN: Ferritin: 579.2 ng/mL — ABNORMAL HIGH (ref 10.0–291.0)

## 2016-11-25 LAB — LIPID PANEL
CHOLESTEROL: 120 mg/dL (ref 0–200)
HDL: 62.6 mg/dL (ref >39.00)
LDL Cholesterol: 47 mg/dL (ref 0–99)
NONHDL: 57.85
Total CHOL/HDL Ratio: 2
Triglycerides: 52 mg/dL (ref 0.0–149.0)
VLDL: 10.4 mg/dL (ref 0.0–40.0)

## 2016-11-25 LAB — TSH: TSH: 4.59 u[IU]/mL — AB (ref 0.35–4.50)

## 2016-11-25 NOTE — Patient Instructions (Addendum)
Today we updated your med list in our EPIC system...    Continue your current medications the same...  Remember to take the Protonix (Pantoprazole) 40mg  tabs about 30 min before the 1st & last meals of the day...  Today we rechecked your blood work...    We will contact you w/ the results when available...   Call for any questions...  Let's plan a follow up visit in 35mo, sooner if needed for problems.Marland KitchenMarland Kitchen

## 2016-11-26 LAB — PATHOLOGIST SMEAR REVIEW

## 2016-11-27 ENCOUNTER — Encounter (HOSPITAL_COMMUNITY): Payer: Medicare Other

## 2016-11-29 ENCOUNTER — Encounter (HOSPITAL_COMMUNITY): Payer: Medicare Other

## 2016-12-01 ENCOUNTER — Encounter: Payer: Self-pay | Admitting: Pulmonary Disease

## 2016-12-01 NOTE — Progress Notes (Signed)
HPI  Review of Systems  Physical Exam  Subjective:    Patient ID: Virginia Casey, female    DOB: 1924-04-29, 83 y.o.   MRN: [id number]  HPI 83 y/o BF here for a follow up visit... she has mult med problems as noted below...  ~  SEE PREV EPIC NOTES FOR OLDER DATA >>    CXR 7/13 showed normal heart size, largeHH, bibasilar atx, otherw clear...  EKG 7/13 showed SBrady, rate52, no acute changes...  Myoview 8/13 at [facility] showed no ischemia or infarction, normal wall motion, EF=70%...  LABS 7/13:  Chems- wnl;  CBC- wnl...   LABS 3/14:  FLP- at goals on Cres20;  Chems- wnl;  CBC- wnl;  TSH=2.11;  VitD=12 & Rec to start 50,000u weekly supplement...   CXR 7/14 showed cardiomeg, largeHH, NAD...   PFT's were attempted but pt absolutely could not do the simple spirometry...  LABS 7/14 showed Chems- wnl, CBC- ok x plat=113K  ~  March 23, 2013:  24mo ROV & Marc reports doing well, no new complaints or concerns...    Chol> on Cres20; FLP 1/15 showed TChol 133, TG 38, HDL 75, LDL 50- we don't have notes/ labs from [facility]....    Renal insuffic> on Amlod5, Losar100, Amio200; Labs 1/15 showed BUN=42, Cr=1.8.Marland KitchenMarland Kitchen She knows to avoid NSAIDs & incr fluids...    Anemia> Hg=12.3, MCV=78; needs to restart Fe daily & stay on this... [name] prob list, meds, xrays and labs> see below for updates >>   LABS 1/15:  FLP- at goals on Cres20;  Chems- ok x renal insuffic w/ BUN=42, Cr=1.8;  CBC- ok w/ Hg=12.3, MCV=78;  TSH=3.09;  VitD=50... REC to continue Cres20, avoid NSAIDs & incr fluids, start FeSO4 daily and continue VitD50K weekly...   ~  September 20, 2013:  89mo ROV & Virginia Casey noted diarrhea x3d last week for which she took some Pepto & now much improved... She reports doing well overall at age 74 w/o other complaints or concerns...  She's been doing Cardiac rehab & really likes it...    She continues regular f/u w/ Cards- DrHarwani on Amlod5, Losar100, Amio200, ASA.Plavix; BP= 130/80 & she denies CP, palpit,  SOB, edema, or cerebral ischemic symptoms...    She remains on Cres20 & FLP 1/15 looked great...     She is clinically & biochem euthyroid w/ TSH 1/15 = 3.09...    She has mild renal insuffic w/ Labs 7/15 showing BUN= 32, Cr= 1.7 which is stable since last Sep    Hx Anemia on Fe daily> Labs 7/15 showed Hg=13.2 (up 1pt), MCV=78, Fe=73 (15%sat) and rec to continue same meds & supplements... [name] prob list, meds, xrays and labs> see below for updates >>  REC to continue same meds and supplements including the Fe...  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     AB> on Combivent prn (hasn't needed in a long time); doing well, no resp infections or exacerbations...    HBP> on Losartan100 & Amlod5; BP= 132/86 & she denies CP, palpit, SOB, edema; rec to continue same meds + diet...    CAD> followed by [name] on ASA81, Plavix75; eval 7/14 w/ abn Myoview & subseq cath 8/14 showed in-stent restenosis in LAD, s/p PCI & back to baseline...    PAFlutter>  still on Amio200 per [facility]; she denies palpit, dizzy, ch in SOB, etc...    Cerebrovasc dis> on ASA81/ Plavix75; she denies cerebral ischemic symptoms...    Chol> on Cres20; FLP 1/16 shows TChol 154, TG 62, HDL 68, LDL 74; continue diet & Cres20...    Thyroid dis> followed by [name]; she has hx hyperthyroidism secondary to Amio & a prob multinod goiter; treated w/ Tapazole, then PTU; now off PTU & "everything is fine"; Labs 1/16 showed TSH= 2.31...    GI> HH, Divertics, Polyps> on Protonix40Bid to avoid Plavix interaction; doing satis w/o abd pain, n/v, c/d, blood seen...    Renal insuffic> on Amlod5, Losar100, Amio200; Labs 1/16 showed BUN=27, Cr=1.52 (improved)... She knows to avoid NSAIDs & incr fluids...    DJD, VitD defic> on Tylenol & VitD 50K per week since labs 3/14 showed VitD level  lower at 12; f/u VitD level 1/16= 28, therefore continue 50K weekly...    Anxiety> on Alpraz0.25 prn which really helps...    Anemia> on Fe w/ VitC; Labs 1/16 showed Hg=13.2, MCV=77, Fe= needs to be repeated; rec to continue supplements daily... [name] prob list, meds, xrays and labs> see below for updates >> Given 2015 Flu vaccine today, OK refills as requested.[name]  LABS 1/16:  FLP- within parameters on Cres20;  Chems- ok x Cr=1.52;  CBC- ok x MCV=77 & Plat=112;  TSH=2.31;  VitD=28... PLAN>> Continue same meds, refills given per request; rec to continue MVI, FeSO4 daily, & VitD 50K weekly...  ~  Jul 27, 2014:  67mo ROV & add-on appt requested for "stomach issues">  Virginia Casey is c/o "my divertics are acting up" over the last week;  Notes periumbil pain off & on, ?ppt events, lasts up to 1h, relief spont; she denies n/v, d/c/bl seen, no f/c/s; her ROS is otherw neg w/o CP, palpit, SOB, cough/ phlegm, etc...       GI> HH, Divertics, Polyps> on Protonix40Bid to avoid Plavix interaction; she has a giantHH measuring 10cm w/ erosions on last EGD 4/08 by DrSam; she was Hosp 11/10 w/ lower GIB believed due to divertics- colon by DrJacobs w/ mult divertics throughout & one adenomatous polyp removed from the sigmoid colon...       EXAM shows Afeb, VSS, O2sat=99% on RA;  HEENT- neg;  Chest- clear w/o w/r/r;  Cor- RR gr1/6SEM w/o r/g;  Abd- soft, nontender, w/o masses; Ext- neg w/o c/c/e... IMP/PLAN>>  Sounds like IBS & we discussed treating her cramping pain w/ BENTYL 20mg - 1/2 to 1 tab qid as needed; if symptoms persist we will request GI consultation...   ~  September 27, 2014:  82mo ROV & Virginia Casey reports much improved "can't complain she says... She continues to f/u w/ Cards- DrHarwani every 52mo; her GI complaints have resolved w/ the Bentyl & now back to baseline... She notes some arthritis pain but Tylenol helps... She is in good spirits!    EXAM shows Afeb, VSS, O2sat=96% on RA;  HEENT- neg;  Chest- clear w/o w/r/r;   Cor- RR gr1/6SEM w/o r/g;  Abd- soft, nontender, w/o masses; Ext- neg w/o c/c/e... [name] prob list, meds, xrays and labs>   IMP/PLAN>>  Stable on current med Rx; as noted she sees Cards Q77mo so we will plan ROV in 63mo, sooner if needed prn...  ~  March 30, 2015:  50mo ROV & Virginia Casey has mult Orthopedic complaints- left scapula/ shoulder/ arm pain for several days, no known trauma, "arthritis Tylenol" is helping some &  we discussed Rx w/ Tramadol & refer to Ortho when she is ready;  No other complaints or concerns;  She continues to see DrHarwani Q60mo, we do not have notes from him to review;  [name] the following medical problems during today's office visit >>     AB> on Combivent prn (hasn't needed in a long time); doing well, no resp infections or exacerbations...    HBP> on Losartan100 & Amlod5; BP= 136/78 & she denies CP, palpit, SOB, edema; rec to continue same meds + diet...    CAD> followed by [name] on ASA81, Plavix75; eval 7/14 w/ abn Myoview & subseq cath 8/14 showed in-stent restenosis in LAD, s/p PCI & back to baseline, we do not have recent notes...    PAFlutter> still on Amio200 per [facility]; she denies palpit, dizzy, ch in SOB, etc...    Cerebrovasc dis> on ASA81/ Plavix75; she denies cerebral ischemic symptoms...    Chol> on Cres20; FLP 1/17 shows TChol 145, TG 62, HDL 70, LDL 63; continue diet & Cres20...    Thyroid dis> followed by [name]; she has hx hyperthyroidism secondary to Amio & a prob multinod goiter; treated w/ Tapazole, then PTU; now off PTU & "everything is fine"; Labs 1/17 showed TSH= 4.00...    GI> HH, Divertics, Polyps> on Protonix40Bid to avoid Plavix interaction; doing satis w/o abd pain, n/v, c/d, blood seen...    Renal insuffic> on Amlod5, Losar100, Amio200; Labs 1/17 showed BUN=24, Cr=1.44 (improved)... She knows to avoid NSAIDs & incr fluids...    DJD, VitD defic> on Tylenol & VitD 50K per week since labs 3/14 showed VitD level lower at 12; f/u VitD  level 1/17= 42, therefore continue 50K weekly...    Anxiety> on Alpraz0.25 prn which really helps...    Anemia> off Fe w/ VitC now; Labs 1/17 showed Hg=13.6, MCV=78, Fe= needs to be repeated; rec to continue supplements daily... EXAM shows Afeb, VSS, O2sat=98% on RA;  HEENT- neg;  Chest- clear w/o w/r/r;  Cor- RR gr1/6SEM w/o r/g;  Abd- soft, nontender, w/o masses; Ext- neg w/o c/c/e...  CXR 03/31/15>  Cardiomegaly, low lung volumes w/ basialr atx, large HH noted, osteopenia and DJD in Tspine...  LABS 03/2015>  FLP- at goals on Cres20;  Chems- wnl x Cr=1.44;  CBC- ok w/ Hg=13.6 but MCV=78;  TSH=4.00;  VitD=42 IMP/PLAN>>  Virginia Casey is stable on current regimen; we will add Tramadol50 prn & encourage Ortho f/u when she is ready; continue Cards f/u DrHarwani & I've asked her to get notes sent to [place]...  ~  September 27, 2015:  82mo ROV & medical follow up-- Virginia Casey relates several problems during the 55mo interval (outlined below but feeling better now)> 1) she saw TP 07/21/15 for an acute visit w/ sinus & chest congestion, cough, incr SOB & wheezing- given ZPak, Pred, Mucinex, Hydromet & improved... 2) she fell 05/2015 (bathmat slipped) & went to the ER> evalo showed CT Head- ok but CT Cspine w/ small (10%) compression fx T1 & T2, no neuro symptoms and pain resolved w/ Tramadol; XRays of Lspine, knees, hips- all OK. 3) she continues to f/u w/ DrHarwani regularly Q41mo & he does blood work  but we do not have notes from him; she reports "everything was good & he was happy"...    Breathing is good; she has Combivent for prn use...    BP remains stable on Amlod5 & Losar100; BP=110/76 today & she denies CP, palpit, dizzy/syncope, edema...    She has  CAD, hx AFlutter, known cerebrovasc dis followed by [facility] on Amio200, ASA/Plavix... EXAM shows Afeb, VSS, O2sat=97% on RA;  HEENT- neg, mallampati2;  Chest- clear now w/o w/r/r;  Cor- RR gr1/6SEM w/o r/g;  Abd- soft, nontender, w/o masses; Ext- neg w/o c/c/e... IMP/PLAN>>   Virginia Casey is stable at 83 y/o w/ mult medical issues as listed; we reviewed her meds and discussed need for medication compliance; no changes made today & she will f/u in 11mo w/ fasting blood work.  ~  April 01, 2016:  36mo ROV & medical follow up visit... Virginia Casey reports that she continues to do well- no new complaints or concerns;  She continues to f/u w/ DrHarwani but we do not have any of his records & pt is reminded to request OV notes to be sent to me...     AB> on Combivent prn (hasn't needed in a long time); doing well, no resp infections or exacerbations...    HBP> on Losartan100 & Amlod5; BP= 136/74 & she denies CP, palpit, SOB, edema; rec to continue same meds + diet...    CAD> followed by [name] on ASA81, Plavix75; eval 7/14 w/ abn Myoview & subseq cath 8/14 showed in-stent restenosis in LAD, s/p PCI & back to baseline, we do not have recent notes...    PAFlutter> still on Amio200 per [facility]; she denies palpit, dizzy, ch in SOB, etc...    Cerebrovasc dis> on ASA81/ Plavix75; she denies cerebral ischemic symptoms...    Chol> on Cres20; FLP 1/17 shows TChol 145, TG 62, HDL 70, LDL 63; continue diet & Cres20, she says DrHarwani is following FLP...    Thyroid dis> followed by [name]; she has hx hyperthyroidism secondary to Amio & a prob multinod goiter; treated w/ Tapazole, then PTU; now off PTU & "everything is fine"; Labs 1/18 shows TSH= 3.44...    GI> HH, Divertics, Polyps> on Protonix40Qd (to avoid Plavix interaction); doing satis w/o abd pain, n/v, c/d, blood seen...    Renal insuffic> on Amlod5, Losar100, Amio200; Labs 1/17 showed BUN=24, Cr=1.44 (improved);  Labs 1/18 shows Cr=1.48... She knows to avoid NSAIDs & incr fluids...    DJD, VitD defic> on Tylenol & VitD 50K per week since labs 3/14 showed VitD level lower at 12; f/u VitD level 1/17= 42, f/u 1/18 shows VitD=49; therefore continue 50K weekly...    Anxiety> on Alpraz0.25 prn which really helps...    Anemia> off Fe w/ VitC now;  Labs 1/18 showed Hg=13.6, MCV=78, Fe= needs to be repeated; rec to continue supplements daily... EXAM shows Afeb, VSS, O2sat=98% on RA;  HEENT- neg, mallampati2;  Chest- clear now w/o w/r/r;  Cor- RR gr1/6SEM w/o r/g;  Abd- soft, nontender, w/o masses; Ext- neg w/o c/c/e;  Neuro- intact w/o focal findings...  LABS 04/01/16>  Chems- ok w/ Cr=1.48;  CBC- wnl w/ Hg=13.5, Plat=116K;  TSH=3.44;  VitD=49... IMP/PLAN>>  Virginia Casey remains stable at 83 y/o w/o new complaints or concerns; she is followed regularly for CARDS by [facility] but we do not have any of his notes to review- I explained this to the pt & requested her to authorize him to send [place] notes to be scanned into Epic... we discussed ROV in 60mo or sooner prn new problems.  ~  September 30, 2016:  32mo ROV & Virginia Casey reports "not good, doctor" stating that DrHarwani (CARDs) put her on "rest mode" for several weeks- c/o SOB, DOE w/ min activ like walking & elev HR (NOTE- O2sats in office today are all  wnl & reads 99% after walking back into the exam room...    Hosp 2/10 - 04/15/16 by DrHarwani> presented to ER w/ left CP 8/10, unstable angina w/ +nuclear stress test, known CAD w/ prev PCI, HBP, HL, HxPAF; ENZ were neg, she had CATH=>patent LAD stent and disch on ASA/Plavix, Amio200, Amlod5, Losar100, res20, Protonix40, Bentyl, VitD, AlbutHFA prn...    She was seen in ER 06/16/16 w/ abd pain & BRB per rectum> Abd exam was benign, stool pos,Hg=10.0, Cr=1.71; CT Abd showed large diaph hernia w/ herniation of the stomach into a retrocardiac position & partial herniation of the transverse colon, no obstructions, right nonobstructing renal stones, +colonoc diverticulosis, mod gas, right hip prosthesis;  She was disch & told to f/u w/ CARDs re ASA/PLAVIX & f/u w/ GI regarding the blood; Cards agreed to hold ASA/Plavix...    Virginia Casey saw GI-PGuenther,NP on 06/25/16> noted prev hx GIB felt to be divertic hemorrhage- last colon 2010 showing severe diverticulosis throughout colon & one  adenomatous polyp removed; at time of OV she felt fine, no abd discomfort; Abd exam was neg; no mention of the large [place] on CT-  they gave her Sp[place] & checked Labs (Hg=9.8, mcv=76, Fe=15 3%sat, Ferritin=10.8); Pt asked to start FeSO4 325mg /d...    We do not have notes from [facility] (Cards)> she has been in Cardiac Rehab grad program> 08/26/16 noted to have irreg rhythm, EKG showed AFib rate110, pt was asymptomatic, placed on Metoprolol25mg /d by [facility] and pt indicates that he didn't restart ASA/Plavix but opted for Eliquis2.5Bid & has since stopped the Amlodine, increased the MetoprololER to 50mg /d, increased Amio to 200mg Bid ("He changed a lot of my meds")...  [name] the following medical problems during today's office visit >>     AB> on Combivent prn (hasn't needed in a long time); doing well, no resp infections or exacerbations...    HBP> on [place]; BP= 148/100 & she denies CP, palpit, SOB, edema; rec to continue same meds + diet, no salt; she will f/u w/ [facility] for further med adjust...    CAD> followed by [facility] now on Eliquis2.5Bid; eval 7/14 w/ abn Myoview & subseq cath 8/14 showed in-stent restenosis in LAD, s/p PCI & back to baseline; subseq re-cath 2/18 w/ patent stent    PAFib/Flutter>  on Amio200 now incr to Bid per [facility]; she denies palpit, dizzy, ch in SOB, etc...    Cerebrovasc dis> off prev ASA/Plavix & on Eliquis2.5Bid; she denies cerebral ischemic symptoms...    Chol> on Cres20; FLP 2/18 shows TChol 125, TG 24, HDL 65, LDL 55; continue diet & Cres20, she says DrHarwani is following FLP...    Thyroid dis> followed by [name]; she has hx hyperthyroidism secondary to Amio & a prob multinod goiter; treated w/ Tapazole, then PTU; now off PTU & "everything is fine"; Labs 1/18 shows TSH= 3.44...    GI> HH, Divertics, Polyps> on Protonix40Qd; Hx abd pain, GIB, known large HH, severe divertics, colon polyps; doing satis now w/o abd pain, n/v, c/d, blood  seen...    Renal insuffic> on MetopER50, Losar100, Amio200Bid; Labs 7/18 showed BUN=29, Cr=1.77;  She knows to avoid NSAIDs & incr fluids...    DJD, VitD defic> on Tylenol & VitD 50K per week since labs 3/14 showed VitD level lower at 12; f/u VitD level 1/17= 42, f/u 1/18 shows VitD=49; therefore continue 50K weekly...    Anxiety> on Alpraz0.25 prn which really helps...    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 EXAM shows Afeb, VSS but BP=148/100, O2sat=99% on RA;  HEENT- neg, mallampati2;  Chest- clear now w/o w/r/r;  Cor- RR gr1/6SEM w/o r/g;  Abd- soft, nontender, w/o masses; Ext- neg w/o c/c/e;  Neuro- intact...   LABS 09/30/16> Chems- ok x BUN=29, Cr=1.77;  CBC- ok x Hg=11.9, mcv=63; Fe=21 (3.4%sat); Ferritin=13.2;  B12=599 IMP/PLAN>>  Virginia Casey is 83 y/o w/ multisystem disease- Cards managed by [facility] & she likes it that way but we do not have notes from him & we rely on her history regarding med changes;  GI is followed by [facility] but give her age & comorbidities they do not want to be aggressive or perform additional procedures, they rec oral Fe but she is clearly not absorbing this med;  We have decided to Rx w/ IV Feraheme x 2 doses now & plan ROV recheck in 10mo to recheck levels;  She also has renal insuffic & knows to avoid NSAIDs and drink plenty of fluids...  NOTE: >50% of this 51min rov was spent in counseling 7 coordination of care...    ~  November 25, 2016:  67mo ROV & Virginia Casey returns feeling better after her IV Feraheme x2 given 8/22 & 8/30, she notes more energy & less weak & tired... She would like to get her Flu shot today- ok.Stanton Kidney remains on the same heart meds from [facility] (we have not received recods from him & have requested them again  today); Hx HBP, CAD- s/p PCI & cath 2/18 w/ patent LAD stent, PAFib, HL, etc;  On Eliquis2.5Bid, ?ASA81, Amio200/d,  MetopER50, back on Amlod5, Losar100, and Cres20; she is feeling better- no CP, palpit, edema...    GI is stable w/ known large HH behind the heart, severe divertic, hx colon polyps, prev GIB; on Protonix40 incr to Bid, Bentyl20 prn cramping; she denies abd pain, n/v, d/c, blood seen...    Known renal insuffic & labs today showed BUN=32, Cr=2.02 & pt reminded to avoid NSAIDs and incr fluid intake...    She was given IV Feraheme x2 in Aug and labs today shows Hg=13.3, mcv=68, Fe=65 (18%sat), Ferritin=579 and Kariann feels a lot better, more energy etc... EXAM shows Afeb, VSS but BP=128/68, O2sat=98% on RA;  HEENT- neg, mallampati2;  Chest- clear now w/o w/r/r;  Cor- irreg gr1/6SEM w/o r/g;  Abd- soft, nontender, w/o masses; Ext- neg w/o c/c/e;  Neuro- intact...   LABS 11/25/16>  FLP- all parameters at goals on Cres20;  Chems- ok x BUN=32, Cr=2.02;  CBC- Hg=13.3, mcv=68;  Fe=65(18%sat), Ferritin=579... IMP/PLAN>>  Virginia Casey is improved after IV Feraheme & we will continue to monitor her blood count & Iron levels to determine the need & freq of Feraheme infusions;  Reminded to take the Protonix40 Bid about 57m before 1st & last meals of the day;  Other labs show renal insuffic & she is reminded to avoid NSAIDs and incr fluid intake (no salt)...          Problem List:  Hearing Loss >> she saw DrCrossley 3/13 w/ a high freq hearing loss & he removed creumen impactions' they are holding off on hearing aides for now...  ASTHMATIC BRONCHITIS (ICD-466.0) - breathing is stable now- s/p recent exacerbation... ~  CXR 1/12  showed basilar atx & large HH, cardiomeg, osteopenia, NAD... ~  [facility] 5/12 w/ refractory AB exac & improved w/ standard therapy & brief course of Pred; CXR- similar, NAD.[name]. ~  CXR 7/13 showed norm heart size, large HH, clear lungs, NAD.[name]. ~  7/14:  She presented w/ a mild exac brought on  by the stress of a trip to [place] etc=> Rx w/ COMBIVENT every 6h as needed... ~  CXR 7/14 showed cardiomeg, largeHH, NAD... We attempted PFTs but she was unable to perform... Os sats were 98% on RA... ~  She remains stable, denies breathing problems and hasn't needed the Combivent...  HYPERTENSION (ICD-401.9) - now on LOSARTAN 100mg /d & AMLODIPINE 5mg /d (and off her prev Ramipril due to asthma & Imdur due to HA) ...  ~  8/12:  BP= 110/72 & not checking BP's at home; she denies visual changes, CP, palipit, syncope, dyspnea, edema, etc... ~  2/13:  BP= 122/82 & she is stable, mostly asymptomatic w/o CP, palpit, SOB, etc... ~  8/13:  BP= 124/86 & she remains stable... ~  3/14: on Losartan100 & off Amlod5; BP= 130/84 & she denies CP, palpit, SOB, edema; rec to continue same meds + diet. ~  7/14: on Losartan100 & off Amlod5; BP= 132/92 & she denies CP, palpit, or edema... ~  10/14: now on Losartan100 + Amlodipine5; BP= 118/88 & she remains asymptomatic... ~  7/15: on Amlod5, Losar100, Amio200, ASA.Plavix; BP= 130/80 & she denies CP, palpit, SOB, edema, or cerebral ischemic symptoms. ~  1/16: on Losartan100 & Amlod5; BP= 132/86 & she denies CP, palpit, SOB, edema; rec to continue same meds + diet. ~  5/16: on Losartan100 & Amlod5; BP= 110/70 & she remains asymptomatic.  ATHEROSCLEROTIC HEART DISEASE (ICD-414.00) & PERIPHERAL VASCULAR DISEASE (ICD-443.9) - regularly on ASA 81mg /d, & off Plavix... followed by [facility] for Cards (we do not have his notes to review med changes)>> ~  hosp in Jan08 w/ syncope and MRI Brain showed mild atrophy & sm vessel dis;  MRA showed basilar art narrowing & mod stenosis in RICA cavernous segment... CDopplers however were WNL w/ antegrade vertebral flow, and no signif ICA stenoses detected... ~  hosp Feb10 by [facility] w/ non-STEMI- s/p PTCA and stent in the LAD. ~  hosp 7/11 by [facility] w/ CP, Abn Myoview & cath w/ 70% midLAD in-stent resteosis; s/p PTCA &  started back on Plavix... ~  she is in Cardiac Rehab & really likes it... ~  7/13:  She went to the ER w/ chest discomfort; ruled out for ischemia (EKG= SBrday, rate52, wnl/NAD) & followed up w/ DrHarwani> he did MYOVIEW 8/13 which was neg for ischemia or infarction, norm wall motion & EF=70%... ~  7/14:  She continues to f/u w/ [facility] her cardiologist;  She will see him soon to f/u from this [place] trip for additional test => Myoview was Abn & Cath 8/14 w/ in-stent restenosis, s/p PCI... ~  10/14: now on ASA81 + Plavix75 and enrolled in cardiac rehab which she loves... (Note: Omep20Bid give via ER 9/14 is being changed to Protonix40Bid due to her Plavix Rx)... ~  She continues to f/u regularly w/ DrHarwani (we do not have notes from him...  ATRIAL FLUTTER, PAROXYSMAL (ICD-427.32) - this occured 2/10 following her cath, PTCA, stent... he diagnosed flutter w/ 2:1 block... currently taking AMIODARONE 200mg /d... ~  She remains in NSR & denies CP, palpit, SOB, etc...  HYPERCHOLESTEROLEMIA (ICD-272.0) - on CRESTOR 20mg /d... ~  FLP  11/08 showed TChol 161, TG 39, HDL 62, LDL 92... continue same med. ~  [place]y 5/09 showed TChol 209, TG 70, HDL 74, LDL 124... rec- better diet, get weight down! ~  FLP in hosp 2/10 showed TChol 130, TG 69, HDL 45, LDL 71 ~  she reports that [facility] does fasting labs... ~  FLP 7/11 in hosp showed TChol 105, TG 30, HDL 66, LDL 33 ~  FLP 2/12 on Cres20 showed TChol 97, TG 38, HDL 50, LDL 39... copy to Cards ?decr to 10mg ? ~  FLP 2/13 on Cres20 showed TChol 118, TG 42, HDL 60, LDL 50... Continue same. ~  Babbie 3/14 on Cres20 showed TChol 128, TG 32, HDL 68, LDL 53  ~  FLP 1/15 on Cres20 showed TChol 133, TG 38, HDL 75, LDL 50 ~  FLP 1/16 on Cres20 showed TChol 154, TG 62, HDL 68, LDL 74  THYROID DISEASE >> Hx Amiodarone induced hyperthyroidism diagnosed 2/12 & referred to [facility], treated w/ PTU (now off) & DrHarwani tapered the Amio... ~  1/13:  She had f/u DrKumar>  everything improve4d w/ reduction in Amio dose; clinically euthyroid & no goiter palpated; TFT's in his office were WNL==> TSH=3.74,  FreeT3=2.4 (2.0-4.4), FreeT4=0.91 (0.61-1.12) ~  Labs 3/14 showed TSH= 2.11 ~  Labs 1/15 showed TSH= 3.09 ~  Labs 1/16 showed TSH= 2.31  HIATAL HERNIA (ICD-553.3) - prev on Nexium & Carafate Liq- but changed to PEPCID 20mg Bid by [facility] due to need for Plavix... she has a giant HH measuring 10cm per DrSam, w/ erosions in the pouch... last EGD by DrSam 4/08... hernia is seen on her CXRs. ~  9/14: she went to the ER w/ Epig pain & they felt it was reflux related due to her [place]; they switched Pepcid to Omep20Bid but she will need to change this to [place] due to the Plavix...  DIVERTICULOSIS OF COLON (ICD-562.10) & COLONIC POLYPS (ICD-211.3) ~  colonoscopy 11/06 showed divertics and 2- 87mm polyps... f/u planned in 4-83yrs... ~  Hosp 11/10 w/ lower GIB believed due to divertics... colonoscopy by DrJacobs showed severe divertics, no bleeding site, 81mm polyp (tubular adenoma) removed from sigmoid...  RENAL INSUFFICIENCY >> she knows to avoid NSAIDs and incr fluid intake... ~  Labs 1/15 showed BUN= 42, Cr= 1.8 ~  Labs 7/15 showed BUN= 32, Cr= 1.7 ~  Labs 1/16 showed BUN= 27, Cr= 1.52  DEGENERATIVE JOINT DISEASE (ICD-715.90) & HIP PAIN, RIGHT (ICD-719.45) - currently taking OTC meds only but DrDean rec Vicodin for as needed use (she prefers Tylenol)... Eval by DrDean 3/09 w/ bone-on-bone right hip, tried injection & pain meds... right THR done 12/09...  VITAMIN D DEFICIENCY (ICD-268.9) ~  labs 5/09 showed Vit D level = 9... pt started on 50K weekly but she switched on her own to 1000u/d OTC. ~  labs 2/12 showed Vit D level = 18... asked to incr Vit D supplement to 2000 u daily (she never did). ~  Labs 2/13 showed Vit D level = 20... Asked to finally incr VitD supplement to 2000u daily... ~  Labs 3/14 showed Vit D level = 12 & Rec to switch to 50K weekly  Rx... ~  Labs 1/15 showed VitD level = 50 & rec to continue VitD 50K weekly... ~  Labs 1/16 showed VitD level = 28 & reminded to take the VitD 50K weekly...  ANXIETY (ICD-300.00) - she is requesting a mild nerve pill & we have written ALPRAZOLAM 0.25mg  Prn use...  ANEMIA (  ICD-285.9) - prev on Niferex 150mg /d, now FeSO4 325mg /d w/ Vit C 500mg ... 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 ~  last blood count 11/08 showed Hg=13.7, Fe=56... ~  labs 5/09 showed Hg= 14.2.[name]. ~  labs 2/10 in hosp showed Hg= 13 ~  Hosp 11/10 w/ lower GIB, & Hg down to 8.6- tc 1u PC up to 9.3.Marland KitchenMarland Kitchen ~  labs 12/10 showed Hg= 10.6.[name]. rec> continue oral Fe w/ VitC... ~  [facility] labs 7/11 showed Hg= 11.6 - 10.5, MCV= 70... rec> restart Fe. ~  labs 2/12 showed Hg= 12.4, MCV= 77, Fe= 56 (sat=10%)... continue Fe w/ VitC. ~  Labs 5/12 in hosp showed Hg= 12.3, MCV= 68, & she continues on FeSO4 daily w/ VitC... ~  Labs 2/13 showed Hg=13.1, MCV=78, Fe=66; rec to continue supplements... ~  Labs 3/14 showed Hg= 13.1 ~  Labs 9/14 showed Hg= 12.8, MCV=74 ~  Labs 1/15 showed Hg= 12.3, MCV=78 and rec to restart FeSO4 325mg  daily... ~  Labs 7/15 showed Hg= 13.2, MCV=78, Fe=73 (15%sat)... rec to continue supplements. ~  Labs 1/16 showed Hg= 13.2, MCV=77, and reminded to continue daily supplements.  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 ~  Immuniz:  She had TDAP 03/2008;  Pneumovax-23 in 05/2009;  Given yearly Flu vaccines...    Past Surgical History:  Procedure Laterality Date  . CORONARY ANGIOPLASTY  10/06/2012  . CORONARY ANGIOPLASTY WITH STENT PLACEMENT  04/2008   "1; Dr Terrence Dupont" (10/06/2012)  . DILATION AND CURETTAGE OF UTERUS    . LEFT HEART CATH AND CORONARY ANGIOGRAPHY N/A 04/15/2016   Procedure: Left Heart Cath and Coronary Angiography;  Surgeon: Charolette Forward, MD;  Location: [place] CV [facility];  Service: Cardiovascular;  Laterality: N/A;  . LEFT HEART  CATHETERIZATION WITH CORONARY ANGIOGRAM N/A 10/06/2012   Procedure: LEFT HEART CATHETERIZATION WITH CORONARY ANGIOGRAM;  Surgeon: Clent Demark, MD;  Location: [place] CATH [facility];  Service: Cardiovascular;  Laterality: N/A;  . TOTAL HIP ARTHROPLASTY Right 12/09   Dr Marlou Sa    Outpatient Encounter Prescriptions as of 11/25/2016  Medication Sig  . acetaminophen (TYLENOL) 325 MG tablet Take 325 mg by mouth every 6 (six) hours as needed. For pain  . ALPRAZolam (XANAX) 0.25 MG tablet Take 0.25 mg by mouth 3 (three) times daily as needed for anxiety.  [name] amiodarone (PACERONE) 200 MG tablet Take 200 mg by mouth daily.   [name] amLODipine (NORVASC) 5 MG tablet Take 5 mg by mouth daily.  [name] apixaban (ELIQUIS) 2.5 MG TABS tablet Take 2.5 mg by mouth 2 (two) times daily.  [name] aspirin 81 MG tablet Take 324 mg by mouth daily.   [name] dicyclomine (BENTYL) 20 MG tablet Take 1/2-1 tablet by mouth up to 4 times a day as needed for abdominal cramping  . ferrous sulfate (IRON SUPPLEMENT) 325 (65 FE) MG tablet Take 1 tablet (325 mg total) by mouth daily with breakfast.  . losartan (COZAAR) 100 MG tablet Take 1 tablet (100 mg total) by mouth daily.  . metoprolol succinate (TOPROL-XL) 50 MG 24 hr tablet Take 50 mg by mouth daily. Take with or immediately following a meal.  . nitroGLYCERIN (NITROSTAT) 0.4 MG SL tablet Place 0.4 mg under the tongue every 5 (five) minutes as needed. May repeat x3   . pantoprazole (PROTONIX) 40 MG tablet take 1 tablet by mouth twice a day  . rosuvastatin (CRESTOR) 20 MG tablet  Take 20 mg by mouth at bedtime.   . Vitamin D, Ergocalciferol, (DRISDOL) 50000 units CAPS capsule take 1 capsule by mouth every week   No facility-administered encounter medications on file as of 11/25/2016.     No Known Allergies    Immunization History  Administered Date(s) Administered  . Influenza Split 02/08/2011, 01/02/2012  . Influenza Whole 12/09/2007, 09/20/2009  . Influenza, High Dose Seasonal PF 01/16/2016  .  Influenza,inj,Quad PF,6+ Mos 12/21/2012, 03/23/2014, 04/21/2015  . Pneumococcal Polysaccharide-23 05/23/2009  . Tdap 03/04/2008    Current Medications, Allergies, Past Medical History, Past Surgical History, Family History, and Social History were reviewed in [employer] record.   Review of Systems         See HPI - all other systems neg except as noted... The patient complains of dyspnea on exertion.  The patient denies anorexia, fever, weight loss, weight gain, vision loss, decreased hearing, hoarseness, chest pain, syncope, peripheral edema, prolonged cough, headaches, hemoptysis, abdominal pain, melena, hematochezia, severe indigestion/heartburn, hematuria, incontinence, muscle weakness, suspicious skin lesions, transient blindness, difficulty walking, depression, unusual weight change, abnormal bleeding, enlarged lymph nodes, and angioedema.     Objective:   Physical Exam     WD, WN, 83 y/o BF in NAD... GENERAL:  Alert & oriented; pleasant & cooperative... HEENT:  [place]/AT, EOM-wnl, EACs-clear, TMs-wnl, NOSE-clear, THROAT-clear & wnl. NECK:  Supple w/ fairROM; no JVD; normal carotid impulses w/o bruits; no thyromegaly or nodules palpated; no lymphadenopathy. CHEST:  Coarse BS w/ no wheezing, no rales, no signs of consolidation. HEART:  Regular Rhythm; gr 1/6 SEM without rubs or gallops detected... ABDOMEN:  Soft & nontender; normal bowel sounds; no organomegaly or masses palpated... EXT: without deformities, mod arthritic changes; s/p right THR; no varicose veins/ +venous insuffic/  tr edema . BACK:  sl tender along right PSIS area... NEURO:  CN's intact; no focal neuro defict... DERM:  no lesions seen...  RADIOLOGY DATA:  Reviewed in the EPIC EMR & discussed w/ the patient...  LABORATORY DATA:  Reviewed in the EPIC EMR & discussed w/ the patient...     Assessment & Plan:    09/30/16>   Symone is 83 y/o w/ multisystem disease- Cards managed by [facility] &  she likes it that way but we do not have notes from him & we rely on her history regarding med changes;  GI is followed by [facility] but give her age & comorbidities they do not want to be aggressive or perform additional procedures, they rec oral Fe but she is clearly not absorbing this med;  We have decided to Rx w/ IV Feraheme x 2 doses now & plan ROV recheck in 70mo to recheck levels;  She also has renal insuffic & knows to avoid NSAIDs and drink plenty of fluids...  11/25/16>   [place]ia Casey is improved after IV Feraheme & we will continue to monitor her blood count & Iron levels to determine the need & freq of Feraheme infusions;  Reminded to take the Protonix40 Bid about 44m before 1st & last meals of the day;  Other labs show renal insuffic & she is reminded to avoid NSAIDs and incr fluid intake (no salt).   AB>  Improved overall & she stopped all prev meds on her own=> using the COMBIVENT respimat prn but hasn't needed... 04/01/16>   Virginia Casey remains stable at 83 y/o w/o new complaints or concerns; she is followed regularly for CARDS by [facility] but we do not have any of  his notes to review- I explained this to the pt & requested her to authorize him to send [place] notes to be scanned into Epic  HBP>  Good control on the Losartan & Amlodipine; asked to monitor BP at home...  ASHD>  Followed by [name] for Cards> cath 8/14 w/ LAD instent restenosis, s/p successful PCI & she is back to baseline; Note: ER 9/14 switched her Pepcid to Omep20Bid but her Plavix is critically important to help patency of the LAD stent; therefore we will switch the Omep to PANTOPRAZOLE40Bid... ~  Stable on meds now without recent problems or need for med adjustments...  AFlutter>  DrHarwani has her on Amio 200mg  daily; her prev thyroid problems have resolved...  CHOL>  On Cres20 w/ good control of FLP...  THYROID DISEASE>  Prev Amio induced hyperthyroidism managed by DrKumar (on PTU prev) & resolved on lower dose of Amio...  GI>   Hx HH/ GERD/ Divertics/ Polyps>  She is followed by DrJacobs for GI as above; current symptoms sound like IBS=> BENTYL20- 1/2 to 1 tab Qid helped...   GU> mild renal insuffic is stable w/ Cr=1.7-1.6 on current meds...  DJD>  She is followed by DrDean for Ortho...  VitD defic>  She remains on VitD 50K weekly, reminded to take it every wk...  Anemia>  Hg is OK but MCV is low, Fe was 73 (improved) & she will continue her Fe & VitC supplements...  Anxiety>  Requesting a mild nerve pill & we wrote for Alprazolam...   Patient's Medications  New Prescriptions   No medications on file  Previous Medications   ACETAMINOPHEN (TYLENOL) 325 MG TABLET    Take 325 mg by mouth every 6 (six) hours as needed. For pain   ALPRAZOLAM (XANAX) 0.25 MG TABLET    Take 0.25 mg by mouth 3 (three) times daily as needed for anxiety.   AMIODARONE (PACERONE) 200 MG TABLET    Take 200 mg by mouth daily.    AMLODIPINE (NORVASC) 5 MG TABLET    Take 5 mg by mouth daily.   APIXABAN (ELIQUIS) 2.5 MG TABS TABLET    Take 2.5 mg by mouth 2 (two) times daily.   ASPIRIN 81 MG TABLET    Take 324 mg by mouth daily.    DICYCLOMINE (BENTYL) 20 MG TABLET    Take 1/2-1 tablet by mouth up to 4 times a day as needed for abdominal cramping   FERROUS SULFATE (IRON SUPPLEMENT) 325 (65 FE) MG TABLET    Take 1 tablet (325 mg total) by mouth daily with breakfast.   LOSARTAN (COZAAR) 100 MG TABLET    Take 1 tablet (100 mg total) by mouth daily.   METOPROLOL SUCCINATE (TOPROL-XL) 50 MG 24 HR TABLET    Take 50 mg by mouth daily. Take with or immediately following a meal.   NITROGLYCERIN (NITROSTAT) 0.4 MG SL TABLET    Place 0.4 mg under the tongue every 5 (five) minutes as needed. May repeat x3    PANTOPRAZOLE (PROTONIX) 40 MG TABLET    take 1 tablet by mouth twice a day   ROSUVASTATIN (CRESTOR) 20 MG TABLET    Take 20 mg by mouth at bedtime.    VITAMIN D, ERGOCALCIFEROL, (DRISDOL) 50000 UNITS CAPS CAPSULE    take 1 capsule by mouth every week   Modified Medications   No medications on file  Discontinued Medications   No medications on file

## 2016-12-02 ENCOUNTER — Encounter (HOSPITAL_COMMUNITY): Payer: Medicare Other | Attending: Cardiology

## 2016-12-02 ENCOUNTER — Telehealth: Payer: Self-pay | Admitting: Pulmonary Disease

## 2016-12-02 DIAGNOSIS — Z029 Encounter for administrative examinations, unspecified: Secondary | ICD-10-CM | POA: Insufficient documentation

## 2016-12-02 NOTE — Telephone Encounter (Signed)
Notes recorded by Noralee Space, MD on 11/30/2016 at 1:17 PM EDT Please notify patient>  FLP is good- all parameters are WNL on Cres20- continue same... [place] x renal insuffic w/ Cr=2.02- rec for pt to incr water intake... CBC shows normal Hg at 13.3 but cells are small w/ mcv=68 & this is suggestive of Thalassemia Iron level is good (wnl), thyroid is borderline... ------------------------------------ Spoke with pt. She is aware of results. Nothing further was needed.

## 2016-12-03 DIAGNOSIS — I482 Chronic atrial fibrillation: Secondary | ICD-10-CM | POA: Diagnosis not present

## 2016-12-03 DIAGNOSIS — I2510 Atherosclerotic heart disease of native coronary artery without angina pectoris: Secondary | ICD-10-CM | POA: Diagnosis not present

## 2016-12-03 DIAGNOSIS — I252 Old myocardial infarction: Secondary | ICD-10-CM | POA: Diagnosis not present

## 2016-12-03 DIAGNOSIS — I350 Nonrheumatic aortic (valve) stenosis: Secondary | ICD-10-CM | POA: Diagnosis not present

## 2016-12-03 DIAGNOSIS — I129 Hypertensive chronic kidney disease with stage 1 through stage 4 chronic kidney disease, or unspecified chronic kidney disease: Secondary | ICD-10-CM | POA: Diagnosis not present

## 2016-12-04 ENCOUNTER — Encounter (HOSPITAL_COMMUNITY): Payer: Medicare Other

## 2016-12-06 ENCOUNTER — Encounter (HOSPITAL_COMMUNITY): Payer: Medicare Other

## 2016-12-09 ENCOUNTER — Encounter (HOSPITAL_COMMUNITY): Payer: Medicare Other

## 2016-12-11 ENCOUNTER — Encounter (HOSPITAL_COMMUNITY): Payer: Medicare Other

## 2016-12-13 ENCOUNTER — Encounter (HOSPITAL_COMMUNITY): Payer: Medicare Other

## 2016-12-16 ENCOUNTER — Encounter (HOSPITAL_COMMUNITY): Payer: Medicare Other

## 2016-12-18 ENCOUNTER — Encounter (HOSPITAL_COMMUNITY): Payer: Medicare Other

## 2016-12-20 ENCOUNTER — Encounter (HOSPITAL_COMMUNITY): Payer: Medicare Other

## 2016-12-20 ENCOUNTER — Ambulatory Visit: Payer: Medicare Other

## 2016-12-23 ENCOUNTER — Encounter (HOSPITAL_COMMUNITY): Payer: Medicare Other

## 2016-12-24 ENCOUNTER — Other Ambulatory Visit: Payer: Self-pay | Admitting: Pulmonary Disease

## 2016-12-24 ENCOUNTER — Ambulatory Visit (INDEPENDENT_AMBULATORY_CARE_PROVIDER_SITE_OTHER): Payer: Medicare Other

## 2016-12-24 DIAGNOSIS — Z23 Encounter for immunization: Secondary | ICD-10-CM

## 2016-12-25 ENCOUNTER — Encounter (HOSPITAL_COMMUNITY): Payer: Medicare Other

## 2016-12-26 NOTE — Telephone Encounter (Signed)
Rx request Xanax 0.25mg .  Pt last seen 11/25/16 and med last refilled 03/12/16.  Dr. Lenna Gilford, please advise if it is okay for [place] to refill this med for pt and if so, what quantity?  Thanks!  No Known Allergies    Current Outpatient Prescriptions:  .  acetaminophen (TYLENOL) 325 MG tablet, Take 325 mg by mouth every 6 (six) hours as needed. For pain, Disp: , Rfl:  .  ALPRAZolam (XANAX) 0.25 MG tablet, Take 0.25 mg by mouth 3 (three) times daily as needed for anxiety., Disp: , Rfl: 0 .  amiodarone (PACERONE) 200 MG tablet, Take 200 mg by mouth daily. , Disp: , Rfl:  .  amLODipine (NORVASC) 5 MG tablet, Take 5 mg by mouth daily., Disp: , Rfl:  .  apixaban (ELIQUIS) 2.5 MG TABS tablet, Take 2.5 mg by mouth 2 (two) times daily., Disp: , Rfl:  .  aspirin 81 MG tablet, Take 324 mg by mouth daily. , Disp: , Rfl:  .  dicyclomine (BENTYL) 20 MG tablet, Take 1/2-1 tablet by mouth up to 4 times a day as needed for abdominal cramping, Disp: 30 tablet, Rfl: 2 .  ferrous sulfate (IRON SUPPLEMENT) 325 (65 FE) MG tablet, Take 1 tablet (325 mg total) by mouth daily with breakfast., Disp: 30 tablet, Rfl: 3 .  losartan (COZAAR) 100 MG tablet, Take 1 tablet (100 mg total) by mouth daily., Disp: 90 tablet, Rfl: 1 .  metoprolol succinate (TOPROL-XL) 50 MG 24 hr tablet, Take 50 mg by mouth daily. Take with or immediately following a meal., Disp: , Rfl:  .  nitroGLYCERIN (NITROSTAT) 0.4 MG SL tablet, Place 0.4 mg under the tongue every 5 (five) minutes as needed. May repeat x3 , Disp: , Rfl:  .  pantoprazole (PROTONIX) 40 MG tablet, take 1 tablet by mouth twice a day, Disp: 60 tablet, Rfl: 11 .  rosuvastatin (CRESTOR) 20 MG tablet, Take 20 mg by mouth at bedtime. , Disp: , Rfl:  .  Vitamin D, Ergocalciferol, (DRISDOL) 50000 units CAPS capsule, take 1 capsule by mouth every week, Disp: 4 capsule, Rfl: 11

## 2016-12-27 ENCOUNTER — Encounter (HOSPITAL_COMMUNITY): Payer: Medicare Other

## 2016-12-27 NOTE — Telephone Encounter (Signed)
Called the rx into the pharmacy for the pt.

## 2016-12-30 ENCOUNTER — Encounter (HOSPITAL_COMMUNITY): Payer: Medicare Other

## 2017-01-01 ENCOUNTER — Encounter (HOSPITAL_COMMUNITY): Payer: Medicare Other

## 2017-01-03 ENCOUNTER — Encounter (HOSPITAL_COMMUNITY): Payer: Self-pay | Admitting: Emergency Medicine

## 2017-01-03 ENCOUNTER — Encounter (HOSPITAL_COMMUNITY): Payer: Medicare Other

## 2017-01-03 ENCOUNTER — Emergency Department (HOSPITAL_COMMUNITY): Payer: Medicare Other

## 2017-01-03 ENCOUNTER — Inpatient Hospital Stay (HOSPITAL_COMMUNITY)
Admission: EM | Admit: 2017-01-03 | Discharge: 2017-01-13 | DRG: 480 | Disposition: A | Discharge status: Hospice | Payer: Medicare Other | Attending: Internal Medicine | Admitting: Internal Medicine

## 2017-01-03 DIAGNOSIS — I739 Peripheral vascular disease, unspecified: Secondary | ICD-10-CM | POA: Diagnosis not present

## 2017-01-03 DIAGNOSIS — Z86010 Personal history of colonic polyps: Secondary | ICD-10-CM

## 2017-01-03 DIAGNOSIS — R945 Abnormal results of liver function studies: Secondary | ICD-10-CM | POA: Diagnosis present

## 2017-01-03 DIAGNOSIS — S7223XA Displaced subtrochanteric fracture of unspecified femur, initial encounter for closed fracture: Secondary | ICD-10-CM | POA: Diagnosis present

## 2017-01-03 DIAGNOSIS — I4892 Unspecified atrial flutter: Secondary | ICD-10-CM | POA: Diagnosis present

## 2017-01-03 DIAGNOSIS — N179 Acute kidney failure, unspecified: Secondary | ICD-10-CM

## 2017-01-03 DIAGNOSIS — Y92009 Unspecified place in unspecified non-institutional (private) residence as the place of occurrence of the external cause: Secondary | ICD-10-CM

## 2017-01-03 DIAGNOSIS — W19XXXA Unspecified fall, initial encounter: Secondary | ICD-10-CM | POA: Diagnosis not present

## 2017-01-03 DIAGNOSIS — R748 Abnormal levels of other serum enzymes: Secondary | ICD-10-CM | POA: Diagnosis not present

## 2017-01-03 DIAGNOSIS — I9589 Other hypotension: Secondary | ICD-10-CM | POA: Diagnosis not present

## 2017-01-03 DIAGNOSIS — W010XXA Fall on same level from slipping, tripping and stumbling without subsequent striking against object, initial encounter: Secondary | ICD-10-CM

## 2017-01-03 DIAGNOSIS — J9811 Atelectasis: Secondary | ICD-10-CM | POA: Diagnosis not present

## 2017-01-03 DIAGNOSIS — D72829 Elevated white blood cell count, unspecified: Secondary | ICD-10-CM | POA: Diagnosis not present

## 2017-01-03 DIAGNOSIS — Z7901 Long term (current) use of anticoagulants: Secondary | ICD-10-CM | POA: Diagnosis not present

## 2017-01-03 DIAGNOSIS — N281 Cyst of kidney, acquired: Secondary | ICD-10-CM | POA: Diagnosis not present

## 2017-01-03 DIAGNOSIS — D696 Thrombocytopenia, unspecified: Secondary | ICD-10-CM | POA: Diagnosis present

## 2017-01-03 DIAGNOSIS — R740 Nonspecific elevation of levels of transaminase and lactic acid dehydrogenase [LDH]: Secondary | ICD-10-CM | POA: Diagnosis present

## 2017-01-03 DIAGNOSIS — D631 Anemia in chronic kidney disease: Secondary | ICD-10-CM | POA: Diagnosis not present

## 2017-01-03 DIAGNOSIS — Z7189 Other specified counseling: Secondary | ICD-10-CM | POA: Diagnosis not present

## 2017-01-03 DIAGNOSIS — E872 Acidosis: Secondary | ICD-10-CM | POA: Diagnosis not present

## 2017-01-03 DIAGNOSIS — D508 Other iron deficiency anemias: Secondary | ICD-10-CM | POA: Diagnosis not present

## 2017-01-03 DIAGNOSIS — Z466 Encounter for fitting and adjustment of urinary device: Secondary | ICD-10-CM | POA: Diagnosis not present

## 2017-01-03 DIAGNOSIS — E8770 Fluid overload, unspecified: Secondary | ICD-10-CM | POA: Diagnosis not present

## 2017-01-03 DIAGNOSIS — D62 Acute posthemorrhagic anemia: Secondary | ICD-10-CM | POA: Diagnosis not present

## 2017-01-03 DIAGNOSIS — S72009A Fracture of unspecified part of neck of unspecified femur, initial encounter for closed fracture: Secondary | ICD-10-CM | POA: Diagnosis not present

## 2017-01-03 DIAGNOSIS — L8990 Pressure ulcer of unspecified site, unspecified stage: Secondary | ICD-10-CM

## 2017-01-03 DIAGNOSIS — E875 Hyperkalemia: Secondary | ICD-10-CM | POA: Diagnosis present

## 2017-01-03 DIAGNOSIS — I480 Paroxysmal atrial fibrillation: Secondary | ICD-10-CM | POA: Diagnosis not present

## 2017-01-03 DIAGNOSIS — R778 Other specified abnormalities of plasma proteins: Secondary | ICD-10-CM

## 2017-01-03 DIAGNOSIS — I129 Hypertensive chronic kidney disease with stage 1 through stage 4 chronic kidney disease, or unspecified chronic kidney disease: Secondary | ICD-10-CM | POA: Diagnosis not present

## 2017-01-03 DIAGNOSIS — N183 Chronic kidney disease, stage 3 (moderate): Secondary | ICD-10-CM | POA: Diagnosis present

## 2017-01-03 DIAGNOSIS — N189 Chronic kidney disease, unspecified: Secondary | ICD-10-CM | POA: Diagnosis not present

## 2017-01-03 DIAGNOSIS — S72142A Displaced intertrochanteric fracture of left femur, initial encounter for closed fracture: Secondary | ICD-10-CM | POA: Diagnosis not present

## 2017-01-03 DIAGNOSIS — E7800 Pure hypercholesterolemia, unspecified: Secondary | ICD-10-CM | POA: Diagnosis present

## 2017-01-03 DIAGNOSIS — I10 Essential (primary) hypertension: Secondary | ICD-10-CM | POA: Diagnosis present

## 2017-01-03 DIAGNOSIS — R578 Other shock: Secondary | ICD-10-CM | POA: Diagnosis not present

## 2017-01-03 DIAGNOSIS — I252 Old myocardial infarction: Secondary | ICD-10-CM

## 2017-01-03 DIAGNOSIS — J80 Acute respiratory distress syndrome: Secondary | ICD-10-CM | POA: Diagnosis not present

## 2017-01-03 DIAGNOSIS — S72002A Fracture of unspecified part of neck of left femur, initial encounter for closed fracture: Secondary | ICD-10-CM | POA: Diagnosis present

## 2017-01-03 DIAGNOSIS — M25552 Pain in left hip: Secondary | ICD-10-CM | POA: Diagnosis not present

## 2017-01-03 DIAGNOSIS — J45909 Unspecified asthma, uncomplicated: Secondary | ICD-10-CM | POA: Diagnosis present

## 2017-01-03 DIAGNOSIS — R339 Retention of urine, unspecified: Secondary | ICD-10-CM | POA: Diagnosis not present

## 2017-01-03 DIAGNOSIS — K219 Gastro-esophageal reflux disease without esophagitis: Secondary | ICD-10-CM | POA: Diagnosis present

## 2017-01-03 DIAGNOSIS — E871 Hypo-osmolality and hyponatremia: Secondary | ICD-10-CM | POA: Diagnosis not present

## 2017-01-03 DIAGNOSIS — E861 Hypovolemia: Secondary | ICD-10-CM | POA: Diagnosis not present

## 2017-01-03 DIAGNOSIS — I4891 Unspecified atrial fibrillation: Secondary | ICD-10-CM

## 2017-01-03 DIAGNOSIS — Z79899 Other long term (current) drug therapy: Secondary | ICD-10-CM

## 2017-01-03 DIAGNOSIS — Z515 Encounter for palliative care: Secondary | ICD-10-CM

## 2017-01-03 DIAGNOSIS — Z8249 Family history of ischemic heart disease and other diseases of the circulatory system: Secondary | ICD-10-CM

## 2017-01-03 DIAGNOSIS — D649 Anemia, unspecified: Secondary | ICD-10-CM | POA: Diagnosis not present

## 2017-01-03 DIAGNOSIS — Z66 Do not resuscitate: Secondary | ICD-10-CM | POA: Diagnosis not present

## 2017-01-03 DIAGNOSIS — Z955 Presence of coronary angioplasty implant and graft: Secondary | ICD-10-CM

## 2017-01-03 DIAGNOSIS — R34 Anuria and oliguria: Secondary | ICD-10-CM | POA: Diagnosis not present

## 2017-01-03 DIAGNOSIS — I361 Nonrheumatic tricuspid (valve) insufficiency: Secondary | ICD-10-CM | POA: Diagnosis not present

## 2017-01-03 DIAGNOSIS — F419 Anxiety disorder, unspecified: Secondary | ICD-10-CM | POA: Diagnosis present

## 2017-01-03 DIAGNOSIS — R7989 Other specified abnormal findings of blood chemistry: Secondary | ICD-10-CM

## 2017-01-03 DIAGNOSIS — I2510 Atherosclerotic heart disease of native coronary artery without angina pectoris: Secondary | ICD-10-CM | POA: Diagnosis present

## 2017-01-03 DIAGNOSIS — Z96641 Presence of right artificial hip joint: Secondary | ICD-10-CM | POA: Diagnosis present

## 2017-01-03 DIAGNOSIS — E785 Hyperlipidemia, unspecified: Secondary | ICD-10-CM | POA: Diagnosis present

## 2017-01-03 DIAGNOSIS — Z419 Encounter for procedure for purposes other than remedying health state, unspecified: Secondary | ICD-10-CM | POA: Diagnosis not present

## 2017-01-03 DIAGNOSIS — T148XXA Other injury of unspecified body region, initial encounter: Secondary | ICD-10-CM | POA: Diagnosis not present

## 2017-01-03 LAB — CBC
HCT: 39.5 % (ref 36.0–46.0)
Hemoglobin: 13.6 g/dL (ref 12.0–15.0)
MCH: 24.7 pg — ABNORMAL LOW (ref 26.0–34.0)
MCHC: 34.4 g/dL (ref 30.0–36.0)
MCV: 71.8 fL — ABNORMAL LOW (ref 78.0–100.0)
Platelets: 55 10*3/uL — ABNORMAL LOW (ref 150–400)
RBC: 5.5 MIL/uL — ABNORMAL HIGH (ref 3.87–5.11)
RDW: 27.6 % — AB (ref 11.5–15.5)
WBC: 5.6 10*3/uL (ref 4.0–10.5)

## 2017-01-03 LAB — BASIC METABOLIC PANEL
Anion gap: 9 (ref 5–15)
BUN: 27 mg/dL — AB (ref 6–20)
CALCIUM: 8.9 mg/dL (ref 8.9–10.3)
CO2: 24 mmol/L (ref 22–32)
CREATININE: 1.95 mg/dL — AB (ref 0.44–1.00)
Chloride: 108 mmol/L (ref 101–111)
GFR calc Af Amer: 25 mL/min — ABNORMAL LOW (ref >60)
GFR, EST NON AFRICAN AMERICAN: 22 mL/min — AB (ref >60)
GLUCOSE: 115 mg/dL — AB (ref 65–99)
Potassium: 3.9 mmol/L (ref 3.5–5.1)
Sodium: 141 mmol/L (ref 135–145)

## 2017-01-03 LAB — TYPE AND SCREEN
ABO/RH(D): O POS
ANTIBODY SCREEN: NEGATIVE

## 2017-01-03 MED ORDER — ONDANSETRON HCL 4 MG/2ML IJ SOLN
4.0000 mg | Freq: Four times a day (QID) | INTRAMUSCULAR | Status: DC | PRN
  Administered 2017-01-03 – 2017-01-12 (×3): 4 mg via INTRAVENOUS
  Filled 2017-01-03 (×3): qty 2

## 2017-01-03 MED ORDER — HYDROCODONE-ACETAMINOPHEN 5-325 MG PO TABS
1.0000 | ORAL_TABLET | Freq: Four times a day (QID) | ORAL | Status: DC | PRN
  Administered 2017-01-06 – 2017-01-08 (×3): 1 via ORAL
  Administered 2017-01-11: 2 via ORAL
  Filled 2017-01-03 (×2): qty 1
  Filled 2017-01-03: qty 2
  Filled 2017-01-03: qty 1

## 2017-01-03 MED ORDER — HYDROMORPHONE HCL 1 MG/ML IJ SOLN
0.5000 mg | Freq: Once | INTRAMUSCULAR | Status: AC
  Administered 2017-01-03: 0.5 mg via INTRAVENOUS
  Filled 2017-01-03: qty 1

## 2017-01-03 MED ORDER — AMIODARONE HCL 200 MG PO TABS
200.0000 mg | ORAL_TABLET | Freq: Every day | ORAL | Status: DC
  Administered 2017-01-04 – 2017-01-13 (×11): 200 mg via ORAL
  Filled 2017-01-03: qty 1
  Filled 2017-01-03 (×2): qty 2
  Filled 2017-01-03: qty 1
  Filled 2017-01-03: qty 2
  Filled 2017-01-03: qty 1
  Filled 2017-01-03 (×2): qty 2
  Filled 2017-01-03: qty 1
  Filled 2017-01-03 (×2): qty 2
  Filled 2017-01-03: qty 1

## 2017-01-03 MED ORDER — MORPHINE SULFATE (PF) 4 MG/ML IV SOLN
INTRAVENOUS | Status: AC
Start: 2017-01-03 — End: 2017-01-04
  Filled 2017-01-03: qty 1

## 2017-01-03 MED ORDER — VITAMIN D (ERGOCALCIFEROL) 1.25 MG (50000 UNIT) PO CAPS
50000.0000 [IU] | ORAL_CAPSULE | ORAL | Status: DC
  Administered 2017-01-04 – 2017-01-11 (×2): 50000 [IU] via ORAL
  Filled 2017-01-03 (×2): qty 1

## 2017-01-03 MED ORDER — SODIUM CHLORIDE 0.9 % IV BOLUS (SEPSIS)
500.0000 mL | Freq: Once | INTRAVENOUS | Status: AC
  Administered 2017-01-03: 500 mL via INTRAVENOUS

## 2017-01-03 MED ORDER — ROSUVASTATIN CALCIUM 20 MG PO TABS
20.0000 mg | ORAL_TABLET | Freq: Every day | ORAL | Status: DC
  Administered 2017-01-04 – 2017-01-12 (×9): 20 mg via ORAL
  Filled 2017-01-03 (×3): qty 1
  Filled 2017-01-03 (×2): qty 2
  Filled 2017-01-03 (×4): qty 1
  Filled 2017-01-03 (×3): qty 2
  Filled 2017-01-03 (×2): qty 1
  Filled 2017-01-03: qty 2

## 2017-01-03 MED ORDER — ONDANSETRON HCL 4 MG PO TABS
4.0000 mg | ORAL_TABLET | Freq: Four times a day (QID) | ORAL | Status: DC | PRN
  Administered 2017-01-05 – 2017-01-13 (×2): 4 mg via ORAL
  Filled 2017-01-03 (×2): qty 1

## 2017-01-03 MED ORDER — ACETAMINOPHEN 650 MG RE SUPP: 650.0000 mg | Freq: Four times a day (QID) | RECTAL | Status: DC | PRN

## 2017-01-03 MED ORDER — ACETAMINOPHEN 325 MG PO TABS
650.0000 mg | ORAL_TABLET | Freq: Four times a day (QID) | ORAL | Status: DC | PRN
  Administered 2017-01-03 – 2017-01-06 (×5): 650 mg via ORAL
  Filled 2017-01-03 (×5): qty 2

## 2017-01-03 MED ORDER — METOPROLOL SUCCINATE ER 50 MG PO TB24
50.0000 mg | ORAL_TABLET | Freq: Every day | ORAL | Status: DC
  Administered 2017-01-04 – 2017-01-08 (×6): 50 mg via ORAL
  Filled 2017-01-03 (×6): qty 1

## 2017-01-03 MED ORDER — NITROGLYCERIN 0.4 MG SL SUBL: 0.4000 mg | SUBLINGUAL_TABLET | SUBLINGUAL | Status: DC | PRN

## 2017-01-03 MED ORDER — MORPHINE SULFATE (PF) 4 MG/ML IV SOLN
0.5000 mg | INTRAVENOUS | Status: DC | PRN
  Administered 2017-01-03: 0.52 mg via INTRAVENOUS
  Administered 2017-01-12: 0.5 mg via INTRAVENOUS
  Filled 2017-01-03 (×2): qty 1

## 2017-01-03 MED ORDER — AMLODIPINE BESYLATE 2.5 MG PO TABS
2.5000 mg | ORAL_TABLET | Freq: Every day | ORAL | Status: DC
  Administered 2017-01-04 – 2017-01-08 (×3): 2.5 mg via ORAL
  Filled 2017-01-03 (×4): qty 1

## 2017-01-03 MED ORDER — AMIODARONE HCL 100 MG PO TABS: 200.0000 mg | ORAL_TABLET | Freq: Every day | ORAL | Status: DC

## 2017-01-03 MED ORDER — LOSARTAN POTASSIUM 50 MG PO TABS
100.0000 mg | ORAL_TABLET | Freq: Every day | ORAL | Status: DC
  Administered 2017-01-04: 100 mg via ORAL
  Filled 2017-01-03: qty 2

## 2017-01-03 MED ORDER — ALPRAZOLAM 0.25 MG PO TABS
0.2500 mg | ORAL_TABLET | Freq: Three times a day (TID) | ORAL | Status: DC | PRN
  Administered 2017-01-11: 0.25 mg via ORAL
  Filled 2017-01-03 (×2): qty 1

## 2017-01-03 MED ORDER — PANTOPRAZOLE SODIUM 40 MG PO TBEC
40.0000 mg | DELAYED_RELEASE_TABLET | Freq: Two times a day (BID) | ORAL | Status: DC
  Administered 2017-01-04 – 2017-01-13 (×18): 40 mg via ORAL
  Filled 2017-01-03 (×19): qty 1

## 2017-01-03 MED ORDER — METOPROLOL SUCCINATE ER 50 MG PO TB24: 50.0000 mg | ORAL_TABLET | Freq: Every day | ORAL | Status: DC

## 2017-01-03 NOTE — ED Provider Notes (Signed)
[facility] Provider Note   CSN: [id number] Arrival date & time: 01/03/17  1620     History   Chief Complaint Chief Complaint  Patient presents with  . Fall    HPI Virginia Casey is a 83 y.o. female.  Patient c/o fall at home just prior to arrival today. Pt was reaching for a rail/ramp, and fell to left side. C/o left hip pain. Pain is constant, dull, mod-severe, non radiating. No prior hip fx. Denies head injury or loc. No headache. No neck or back pain. No numbness/weakness. Skin intact. Denies other pain or injury.    The history is provided by the patient.  Fall  Pertinent negatives include no chest pain, no abdominal pain, no headaches and no shortness of breath.    Past Medical History:  Diagnosis Date  . Anemia   . Anxiety   . Atherosclerotic heart disease   . Atrial flutter, paroxysmal ([place])   . Colon polyp   . Diverticulosis of colon   . DJD (degenerative joint disease)    "legs" (10/06/2012)  . Exertional shortness of breath   . GERD (gastroesophageal reflux disease)   . Hiatal hernia   . History of blood transfusion ? 2010  . HTN (hypertension)   . Hypercholesterolemia   . Myocardial infarction ([place]) 2010  . Pneumonia    "once; years ago" (10/06/2012)  . PVD (peripheral vascular disease) ([place])   . Vitamin D deficiency     Patient Active Problem List   Diagnosis Date Noted  . Unstable angina ([place]) 04/13/2016  . Fall 05/12/2015  . IBS (irritable bowel syndrome) 07/30/2014  . Asthmatic bronchitis 07/16/2010  . Disorder of thyroid 04/18/2010  . Vitamin D deficiency 07/26/2008  . BACK PAIN, LUMBAR 07/26/2008  . ATRIAL FLUTTER, PAROXYSMAL 07/11/2008  . Diverticulosis of large intestine 07/09/2007  . COLONIC POLYPS 03/16/2007  . HYPERCHOLESTEROLEMIA 03/16/2007  . Anemia 03/16/2007  . Anxiety state 03/16/2007  . Essential hypertension 03/16/2007  . Coronary atherosclerosis 03/16/2007  . Peripheral vascular disease  ([place]) 03/16/2007  . Diaphragmatic hernia 03/16/2007  . Osteoarthritis 03/16/2007    Past Surgical History:  Procedure Laterality Date  . CORONARY ANGIOPLASTY  10/06/2012  . CORONARY ANGIOPLASTY WITH STENT PLACEMENT  04/2008   "1; Dr Terrence Dupont" (10/06/2012)  . DILATION AND CURETTAGE OF UTERUS    . LEFT HEART CATH AND CORONARY ANGIOGRAPHY N/A 04/15/2016   Procedure: Left Heart Cath and Coronary Angiography;  Surgeon: Charolette Forward, MD;  Location: [facility];  Service: Cardiovascular;  Laterality: N/A;  . LEFT HEART CATHETERIZATION WITH CORONARY ANGIOGRAM N/A 10/06/2012   Procedure: LEFT HEART CATHETERIZATION WITH CORONARY ANGIOGRAM;  Surgeon: Clent Demark, MD;  Location: [facility];  Service: Cardiovascular;  Laterality: N/A;  . TOTAL HIP ARTHROPLASTY Right 12/09   Dr Marlou Sa    OB History    No data available       Home Medications    Prior to Admission medications   Medication Sig Start Date End Date Taking? Authorizing Provider  acetaminophen (TYLENOL) 325 MG tablet Take 325 mg by mouth every 6 (six) hours as needed. For pain    [provider]  ALPRAZolam Duanne Moron) 0.25 MG tablet take 1 tablet by mouth three times a day if needed 12/27/16   Noralee Space, MD  amiodarone (PACERONE) 200 MG tablet Take 200 mg by mouth daily.     [provider]  amLODipine (NORVASC) 5 MG tablet Take 5  mg by mouth daily.    [provider]  apixaban (ELIQUIS) 2.5 MG TABS tablet Take 2.5 mg by mouth 2 (two) times daily.    [provider]  aspirin 81 MG tablet Take 324 mg by mouth daily.     [provider]  dicyclomine (BENTYL) 20 MG tablet Take 1/2-1 tablet by mouth up to 4 times a day as needed for abdominal cramping 07/27/14   Noralee Space, MD  ferrous sulfate (IRON SUPPLEMENT) 325 (65 FE) MG tablet Take 1 tablet (325 mg total) by mouth daily with breakfast. 07/23/16   Willia Craze, NP  losartan (COZAAR) 100 MG tablet Take 1 tablet (100 mg total)  by mouth daily. 07/18/15   Noralee Space, MD  metoprolol succinate (TOPROL-XL) 50 MG 24 hr tablet Take 50 mg by mouth daily. Take with or immediately following a meal.    [provider]  nitroGLYCERIN (NITROSTAT) 0.4 MG SL tablet Place 0.4 mg under the tongue every 5 (five) minutes as needed. May repeat x3     [provider]  pantoprazole (PROTONIX) 40 MG tablet take 1 tablet by mouth twice a day 05/01/16   Noralee Space, MD  rosuvastatin (CRESTOR) 20 MG tablet Take 20 mg by mouth at bedtime.     [provider]  Vitamin D, Ergocalciferol, (DRISDOL) 50000 units CAPS capsule take 1 capsule by mouth every week 08/09/16   Noralee Space, MD    Family History Family History  Problem Relation Age of Onset  . Heart disease Mother   . Rheum arthritis Mother   . Heart disease Maternal Grandmother   . Heart disease Maternal Grandfather   . Rheum arthritis Father   . Lung cancer Father   . Stroke Maternal Aunt   . Colon cancer Neg Hx   . Stomach cancer Neg Hx   . Rectal cancer Neg Hx   . Esophageal cancer Neg Hx   . Liver cancer Neg Hx     Social History Social History  Substance Use Topics  . Smoking status: Never Smoker  . Smokeless tobacco: Never Used  . Alcohol use No     Allergies   Patient has no known allergies.   Review of Systems Review of Systems  Constitutional: Negative for chills and fever.  HENT: Negative for sore throat.   Eyes: Negative for redness.  Respiratory: Negative for shortness of breath.   Cardiovascular: Negative for chest pain.  Gastrointestinal: Negative for abdominal pain.  Genitourinary: Negative for flank pain.  Musculoskeletal: Negative for back pain and neck pain.  Skin: Negative for rash.  Neurological: Negative for headaches.  Hematological: Does not bruise/bleed easily.  Psychiatric/Behavioral: Negative for confusion.     Physical Exam Updated Vital Signs There were no vitals taken for this  visit.  Physical Exam  Constitutional: She appears well-developed and well-nourished. No distress.  HENT:  Head: Atraumatic.  Eyes: Pupils are equal, round, and reactive to light. Conjunctivae are normal. No scleral icterus.  Neck: Neck supple. No tracheal deviation present.  Cardiovascular: Normal rate, regular rhythm, normal heart sounds and intact distal pulses.   Pulmonary/Chest: Effort normal and breath sounds normal. No respiratory distress. She exhibits no tenderness.  Abdominal: Soft. Normal appearance. She exhibits no distension. There is no tenderness.  Musculoskeletal: She exhibits no edema.  CTLS spine, non tender, aligned, no step off. Shortening left leg. Distal pulses palp. Pain w palp/rom left hip.   Neurological: She is alert.  Alert, speech clear.  motor/sens grossly intact bil.   Skin: Skin is warm and dry. No rash noted.  Psychiatric: She has a normal mood and affect.  Nursing note and vitals reviewed.    ED Treatments / Results  Labs (all labs ordered are listed, but only abnormal results are displayed) Results for orders placed or performed during the hospital encounter of 37/16/96  Basic metabolic panel  Result Value Ref Range   Sodium 141 135 - 145 mmol/L   Potassium 3.9 3.5 - 5.1 mmol/L   Chloride 108 101 - 111 mmol/L   CO2 24 22 - 32 mmol/L   Glucose, Bld 115 (H) 65 - 99 mg/dL   BUN 27 (H) 6 - 20 mg/dL   Creatinine, Ser 1.95 (H) 0.44 - 1.00 mg/dL   Calcium 8.9 8.9 - 10.3 mg/dL   GFR calc non Af Amer 22 (L) >60 mL/min   GFR calc Af Amer 25 (L) >60 mL/min   Anion gap 9 5 - 15  CBC  Result Value Ref Range   WBC 5.6 4.0 - 10.5 K/uL   RBC 5.50 (H) 3.87 - 5.11 MIL/uL   Hemoglobin 13.6 12.0 - 15.0 g/dL   HCT 39.5 36.0 - 46.0 %   MCV 71.8 (L) 78.0 - 100.0 fL   MCH 24.7 (L) 26.0 - 34.0 pg   MCHC 34.4 30.0 - 36.0 g/dL   RDW 27.6 (H) 11.5 - 15.5 %   Platelets 55 (L) 150 - 400 K/uL  Basic metabolic panel  Result Value Ref Range   Sodium 137 135 - 145  mmol/L   Potassium 4.0 3.5 - 5.1 mmol/L   Chloride 106 101 - 111 mmol/L   CO2 24 22 - 32 mmol/L   Glucose, Bld 121 (H) 65 - 99 mg/dL   BUN 27 (H) 6 - 20 mg/dL   Creatinine, Ser 1.94 (H) 0.44 - 1.00 mg/dL   Calcium 8.3 (L) 8.9 - 10.3 mg/dL   GFR calc non Af Amer 22 (L) >60 mL/min   GFR calc Af Amer 25 (L) >60 mL/min   Anion gap 7 5 - 15  CBC  Result Value Ref Range   WBC 9.0 4.0 - 10.5 K/uL   RBC 4.52 3.87 - 5.11 MIL/uL   Hemoglobin 10.6 (L) 12.0 - 15.0 g/dL   HCT 32.6 (L) 36.0 - 46.0 %   MCV 72.1 (L) 78.0 - 100.0 fL   MCH 23.5 (L) 26.0 - 34.0 pg   MCHC 32.5 30.0 - 36.0 g/dL   RDW NOT CALCULATED 11.5 - 15.5 %   Platelets  150 - 400 K/uL    PLATELET CLUMPS NOTED ON SMEAR, COUNT APPEARS DECREASED  Type and screen  Result Value Ref Range   ABO/RH(D) O POS    Antibody Screen NEG    Sample Expiration 01/06/2017    *Note: Due to a large number of results and/or encounters for the requested time period, some results have not been displayed. A complete set of results can be found in Results Review.   Dg Chest 1 View  Result Date: 01/03/2017 CLINICAL DATA:  Preop left hip fracture. EXAM: CHEST 1 VIEW COMPARISON:  04/13/2016 FINDINGS: Cardiomegaly with large hiatal hernia. Aortic atherosclerosis without aneurysm. Atelectasis at the lung bases without pneumonic consolidation or CHF. No effusion or pneumothorax. No acute osseous abnormality of bony thorax. Osteoarthritic spurring about the included left shoulder. IMPRESSION: 1. Stable cardiomegaly with large hiatal hernia and bibasilar atelectasis. 2. No focal pneumonia. 3.  Aortic atherosclerosis. Electronically Signed   By: Ashley Royalty M.D.   On: 01/03/2017 17:44   Dg Hip Unilat W Or W/o Pelvis 2-3 Views Left  Result Date: 01/03/2017 CLINICAL DATA:  Left hip pain after fall today. EXAM: DG HIP (WITH OR WITHOUT PELVIS) 2-3V LEFT COMPARISON:  05/08/2015 FINDINGS: There is an acute, closed, intertrochanteric fracture of the left femur with  avulsed lesser trochanter. Osteoarthritic joint space narrowing of the left hip. Intact bony pelvis, pubic rami and partially included right uncemented hip arthroplasty. Lower lumbar degenerative facet arthropathy. IMPRESSION: 1. Acute, closed, varus angulated intertrochanteric fracture of the left femur with avulsed lesser trochanter. 2. Lower lumbar degenerative facet arthropathy. 3. Intact bony pelvis and included right hip arthroplasty. Electronically Signed   By: Ashley Royalty M.D.   On: 01/03/2017 17:47    EKG  EKG Interpretation None       Radiology Dg Chest 1 View  Result Date: 01/03/2017 CLINICAL DATA:  Preop left hip fracture. EXAM: CHEST 1 VIEW COMPARISON:  04/13/2016 FINDINGS: Cardiomegaly with large hiatal hernia. Aortic atherosclerosis without aneurysm. Atelectasis at the lung bases without pneumonic consolidation or CHF. No effusion or pneumothorax. No acute osseous abnormality of bony thorax. Osteoarthritic spurring about the included left shoulder. IMPRESSION: 1. Stable cardiomegaly with large hiatal hernia and bibasilar atelectasis. 2. No focal pneumonia. 3. Aortic atherosclerosis. Electronically Signed   By: Ashley Royalty M.D.   On: 01/03/2017 17:44   Dg Hip Unilat W Or W/o Pelvis 2-3 Views Left  Result Date: 01/03/2017 CLINICAL DATA:  Left hip pain after fall today. EXAM: DG HIP (WITH OR WITHOUT PELVIS) 2-3V LEFT COMPARISON:  05/08/2015 FINDINGS: There is an acute, closed, intertrochanteric fracture of the left femur with avulsed lesser trochanter. Osteoarthritic joint space narrowing of the left hip. Intact bony pelvis, pubic rami and partially included right uncemented hip arthroplasty. Lower lumbar degenerative facet arthropathy. IMPRESSION: 1. Acute, closed, varus angulated intertrochanteric fracture of the left femur with avulsed lesser trochanter. 2. Lower lumbar degenerative facet arthropathy. 3. Intact bony pelvis and included right hip arthroplasty. Electronically Signed    By: Ashley Royalty M.D.   On: 01/03/2017 17:47    Procedures Procedures (including critical care time)  Medications Ordered in ED Medications  HYDROmorphone (DILAUDID) injection 0.5 mg (not administered)  sodium chloride 0.9 % bolus 500 mL (not administered)     Initial Impression / Assessment and Plan / ED Course  I have reviewed the triage vital signs and the nursing notes.  Pertinent labs & imaging results that were available during my care of the patient were reviewed by me and considered in my medical decision making (see chart for details).  Iv ns. Labs. Dilaudid iv.   Xrays.  Reviewed nursing notes and prior charts for additional history.   xrays w hip fx.  Pt informed of results. Plans made for admission.  Pain improved w meds.    Final Clinical Impressions(s) / ED Diagnoses   Final diagnoses:  None    New Prescriptions New Prescriptions   No medications on file     Lajean Saver, MD 01/04/17 1555

## 2017-01-03 NOTE — ED Triage Notes (Signed)
Pt arrives from home via EMS due to fall. Pt reports falling on ramp while walking from car to house. Pt endorses landing on left hip. Denies hitting head or LOC. EMS reports shortening of left hip and tender pelvis. No pain medications given by EMS.

## 2017-01-03 NOTE — ED Notes (Signed)
Family at bedside. 

## 2017-01-03 NOTE — Consult Note (Signed)
ORTHOPAEDIC CONSULTATION  REQUESTING PHYSICIAN: Rise Patience, MD  PCP:  Noralee Space, MD  Chief Complaint: Left hip pain following a fall  HPI: Virginia Casey is a 83 y.o. female who complains of left hip pain and inability to bear weight following a fall today. Virginia Casey states that she was in her normal state of health earlier today after completing a trip to the grocery store. She fell while getting out of the car and moving back into her house. She landed on her left hip. She had immediate pain and inability to stand on the leg. She was transported to the emergency department where x-rays demonstrated a left intertrochanteric hip fracture. At this time she denies any other concomitant pain in the head neck or extremities.  At her baseline she is independent with all ADLs and lives independently along with her daughter. She does not require any assistive devices. She does have a past medical history significant for peripheral vascular disease, atrial flutter, and coronary artery disease that have required her to be on Eliquis as an anticoagulant. She currently denies any numbness tingling or other associated symptoms in the left lower extremity.  Past Medical History:  Diagnosis Date  . Anemia   . Anxiety   . Atherosclerotic heart disease   . Atrial flutter, paroxysmal ([place])   . Colon polyp   . Diverticulosis of colon   . DJD (degenerative joint disease)    "legs" (10/06/2012)  . Exertional shortness of breath   . GERD (gastroesophageal reflux disease)   . Hiatal hernia   . History of blood transfusion ? 2010  . HTN (hypertension)   . Hypercholesterolemia   . Myocardial infarction ([place]) 2010  . Pneumonia    "once; years ago" (10/06/2012)  . PVD (peripheral vascular disease) ([place])   . Vitamin D deficiency    Past Surgical History:  Procedure Laterality Date  . CORONARY ANGIOPLASTY  10/06/2012  . CORONARY ANGIOPLASTY WITH STENT PLACEMENT  04/2008   "1; Dr Terrence Dupont"  (10/06/2012)  . DILATION AND CURETTAGE OF UTERUS    . LEFT HEART CATH AND CORONARY ANGIOGRAPHY N/A 04/15/2016   Procedure: Left Heart Cath and Coronary Angiography;  Surgeon: Charolette Forward, MD;  Location: [facility];  Service: Cardiovascular;  Laterality: N/A;  . LEFT HEART CATHETERIZATION WITH CORONARY ANGIOGRAM N/A 10/06/2012   Procedure: LEFT HEART CATHETERIZATION WITH CORONARY ANGIOGRAM;  Surgeon: Clent Demark, MD;  Location: [facility];  Service: Cardiovascular;  Laterality: N/A;  . TOTAL HIP ARTHROPLASTY Right 12/09   Dr Marlou Sa   Social History   Social History  . Marital status: Widowed    Spouse name: N/A  . Number of children: 3  . Years of education: N/A   Occupational History  . retired    Social History Main Topics  . Smoking status: Never Smoker  . Smokeless tobacco: Never Used  . Alcohol use No  . Drug use: No  . Sexual activity: No   Other Topics Concern  . None   Social History Narrative   Lives with granddaughter   3 children, 1 has passed   No caffeine         Family History  Problem Relation Age of Onset  . Heart disease Mother   . Rheum arthritis Mother   . Heart disease Maternal Grandmother   . Heart disease Maternal Grandfather   . Rheum arthritis Father   . Lung cancer Father   . Stroke Maternal  Aunt   . Colon cancer Neg Hx   . Stomach cancer Neg Hx   . Rectal cancer Neg Hx   . Esophageal cancer Neg Hx   . Liver cancer Neg Hx    No Known Allergies Prior to Admission medications   Medication Sig Start Date End Date Taking? Authorizing Provider  acetaminophen (TYLENOL) 325 MG tablet Take 325 mg by mouth every 6 (six) hours as needed. For pain   Yes [provider]  ALPRAZolam Duanne Moron) 0.25 MG tablet take 1 tablet by mouth three times a day if needed 12/27/16  Yes Noralee Space, MD  amiodarone (PACERONE) 200 MG tablet Take 200 mg by mouth daily.    Yes [provider]  amLODipine (NORVASC) 2.5 MG tablet Take 2.5 mg  by mouth daily. 12/28/16  Yes [provider]  apixaban (ELIQUIS) 2.5 MG TABS tablet Take 2.5 mg by mouth 2 (two) times daily.   Yes [provider]  losartan (COZAAR) 100 MG tablet Take 1 tablet (100 mg total) by mouth daily. 07/18/15  Yes Noralee Space, MD  metoprolol succinate (TOPROL-XL) 50 MG 24 hr tablet Take 50 mg by mouth daily. Take with or immediately following a meal.   Yes [provider]  nitroGLYCERIN (NITROSTAT) 0.4 MG SL tablet Place 0.4 mg under the tongue every 5 (five) minutes as needed. May repeat x3    Yes [provider]  pantoprazole (PROTONIX) 40 MG tablet take 1 tablet by mouth twice a day 05/01/16  Yes Noralee Space, MD  rosuvastatin (CRESTOR) 20 MG tablet Take 20 mg by mouth at bedtime.    Yes [provider]  Vitamin D, Ergocalciferol, (DRISDOL) 50000 units CAPS capsule take 1 capsule by mouth every week 08/09/16  Yes Noralee Space, MD  dicyclomine (BENTYL) 20 MG tablet Take 1/2-1 tablet by mouth up to 4 times a day as needed for abdominal cramping Patient not taking: Reported on 01/03/2017 07/27/14   Noralee Space, MD  ferrous sulfate (IRON SUPPLEMENT) 325 (65 FE) MG tablet Take 1 tablet (325 mg total) by mouth daily with breakfast. Patient not taking: Reported on 01/03/2017 07/23/16   Willia Craze, NP   Dg Chest 1 View  Result Date: 01/03/2017 CLINICAL DATA:  Preop left hip fracture. EXAM: CHEST 1 VIEW COMPARISON:  04/13/2016 FINDINGS: Cardiomegaly with large hiatal hernia. Aortic atherosclerosis without aneurysm. Atelectasis at the lung bases without pneumonic consolidation or CHF. No effusion or pneumothorax. No acute osseous abnormality of bony thorax. Osteoarthritic spurring about the included left shoulder. IMPRESSION: 1. Stable cardiomegaly with large hiatal hernia and bibasilar atelectasis. 2. No focal pneumonia. 3. Aortic atherosclerosis. Electronically Signed   By: Ashley Royalty M.D.   On: 01/03/2017 17:44   Dg Hip  Unilat W Or W/o Pelvis 2-3 Views Left  Result Date: 01/03/2017 CLINICAL DATA:  Left hip pain after fall today. EXAM: DG HIP (WITH OR WITHOUT PELVIS) 2-3V LEFT COMPARISON:  05/08/2015 FINDINGS: There is an acute, closed, intertrochanteric fracture of the left femur with avulsed lesser trochanter. Osteoarthritic joint space narrowing of the left hip. Intact bony pelvis, pubic rami and partially included right uncemented hip arthroplasty. Lower lumbar degenerative facet arthropathy. IMPRESSION: 1. Acute, closed, varus angulated intertrochanteric fracture of the left femur with avulsed lesser trochanter. 2. Lower lumbar degenerative facet arthropathy. 3. Intact bony pelvis and included right hip arthroplasty. Electronically Signed   By: Ashley Royalty M.D.   On: 01/03/2017 17:47    Positive  ROS: All other systems have been reviewed and were otherwise negative with the exception of those mentioned in the HPI and as above.  Physical Exam: General: Alert, no acute distress Cardiovascular: No pedal edema Respiratory: No cyanosis, no use of accessory musculature GI: No organomegaly, abdomen is soft and non-tender Skin: No lesions in the area of chief complaint Neurologic: Sensation intact distally Psychiatric: Patient is competent for consent with normal mood and affect Lymphatic: No axillary or cervical lymphadenopathy  MUSCULOSKELETAL:  Physical examination left lower extremity:  Left leg is shortened and externally rotated. She has pain in the groin with any motion of the leg. Distally she has a good 2+ dorsalis pedis pulse. She endorses sensation intact to light touch in the deep and superficial peroneal nerves, sural nerve, saphenous, tibial nerve. Motor is intact with tibialis anterior, gastrocsoleus, flexor hallucis longus, extensor hallucis longus.   Assessment: 1. Closed, left intertrochanteric hip fracture.  Plan: - Virginia Casey and her granddaughter and I discussed at length the nature of her  injury and our recommendation for operative fixation. Unfortunately she has recently been on Eliquis for anticoagulation. We will wait for 48-72 hours for washout of this to reduce the risk of intraoperative and immediate postoperative bleeding. We will plan for surgery Monday afternoon. - The risks, benefits, and alternatives were discussed with the patient. There are risks associated with the surgery including, but not limited to, problems with anesthesia (death), infection, differences in leg length/angulation/rotation, fracture of bones, loosening or failure of implants, malunion, nonunion, hematoma (blood accumulation) which may require surgical drainage, blood clots, pulmonary embolism, nerve injury (foot drop), and blood vessel injury. The patient understands these risks and elects to proceed.  - We appreciate the medicine team's admission and the excellent care for her medical comorbidities during this hospitalization.  - She will need to be nothing by mouth at midnight on Sunday night in preparation for surgery Monday. Nonweightbearing to the left lower extremity.    Nicholes Stairs, MD Cell [phone]    01/03/2017 6:54 PM

## 2017-01-03 NOTE — H&P (Signed)
History and Physical    SHAYLYNN NULTY IRJ:[id number] DOB: 10-Jul-1924 DOA: 01/03/2017  PCP: Noralee Space, MD  Patient coming from: Home.  Chief Complaint: Fall.  HPI: Virginia Casey is a 83 y.o. female with history of CAD status post stenting, atrial fibrillation/flutter, hypertension, chronic kidney disease, anemia and thrombocytopenia was brought to the ER after patient had a fall.  Patient had gone to get her groceries and after getting back home patient was getting into her house holding onto the ramp when she suddenly lost balance and fell.  Denies losing consciousness or hitting her head.  Denies any chest pain or shortness of breath.  Patient had some pain in the left hip and was brought to the ER.  ED Course: X-rays revealed left hip fracture and on-call orthopedic surgeon has been consulted.  On my exam patient appears to be not in distress.  Denies any chest pain or shortness of breath.  Patient is on apixaban so surgery has been postponed for 48 hours.  Review of Systems: As per HPI, rest all negative.   Past Medical History:  Diagnosis Date  . Anemia   . Anxiety   . Atherosclerotic heart disease   . Atrial flutter, paroxysmal ([place])   . Colon polyp   . Diverticulosis of colon   . DJD (degenerative joint disease)    "legs" (10/06/2012)  . Exertional shortness of breath   . GERD (gastroesophageal reflux disease)   . Hiatal hernia   . History of blood transfusion ? 2010  . HTN (hypertension)   . Hypercholesterolemia   . Myocardial infarction ([place]) 2010  . Pneumonia    "once; years ago" (10/06/2012)  . PVD (peripheral vascular disease) ([place])   . Vitamin D deficiency     Past Surgical History:  Procedure Laterality Date  . CORONARY ANGIOPLASTY  10/06/2012  . CORONARY ANGIOPLASTY WITH STENT PLACEMENT  04/2008   "1; Dr Terrence Dupont" (10/06/2012)  . DILATION AND CURETTAGE OF UTERUS    . LEFT HEART CATH AND CORONARY ANGIOGRAPHY N/A 04/15/2016   Procedure: Left Heart Cath and  Coronary Angiography;  Surgeon: Charolette Forward, MD;  Location: [facility];  Service: Cardiovascular;  Laterality: N/A;  . LEFT HEART CATHETERIZATION WITH CORONARY ANGIOGRAM N/A 10/06/2012   Procedure: LEFT HEART CATHETERIZATION WITH CORONARY ANGIOGRAM;  Surgeon: Clent Demark, MD;  Location: [facility];  Service: Cardiovascular;  Laterality: N/A;  . TOTAL HIP ARTHROPLASTY Right 12/09   Dr Marlou Sa     reports that she has never smoked. She has never used smokeless tobacco. She reports that she does not drink alcohol or use drugs.  No Known Allergies  Family History  Problem Relation Age of Onset  . Heart disease Mother   . Rheum arthritis Mother   . Heart disease Maternal Grandmother   . Heart disease Maternal Grandfather   . Rheum arthritis Father   . Lung cancer Father   . Stroke Maternal Aunt   . Colon cancer Neg Hx   . Stomach cancer Neg Hx   . Rectal cancer Neg Hx   . Esophageal cancer Neg Hx   . Liver cancer Neg Hx     Prior to Admission medications   Medication Sig Start Date End Date Taking? Authorizing Provider  acetaminophen (TYLENOL) 325 MG tablet Take 325 mg by mouth every 6 (six) hours as needed. For pain   Yes [provider]  ALPRAZolam (XANAX) 0.25 MG tablet take 1 tablet by mouth  three times a day if needed 12/27/16  Yes Noralee Space, MD  amiodarone (PACERONE) 200 MG tablet Take 200 mg by mouth daily.    Yes [provider]  amLODipine (NORVASC) 2.5 MG tablet Take 2.5 mg by mouth daily. 12/28/16  Yes [provider]  apixaban (ELIQUIS) 2.5 MG TABS tablet Take 2.5 mg by mouth 2 (two) times daily.   Yes [provider]  losartan (COZAAR) 100 MG tablet Take 1 tablet (100 mg total) by mouth daily. 07/18/15  Yes Noralee Space, MD  metoprolol succinate (TOPROL-XL) 50 MG 24 hr tablet Take 50 mg by mouth daily. Take with or immediately following a meal.   Yes [provider]  nitroGLYCERIN (NITROSTAT) 0.4 MG SL tablet  Place 0.4 mg under the tongue every 5 (five) minutes as needed. May repeat x3    Yes [provider]  pantoprazole (PROTONIX) 40 MG tablet take 1 tablet by mouth twice a day 05/01/16  Yes Noralee Space, MD  rosuvastatin (CRESTOR) 20 MG tablet Take 20 mg by mouth at bedtime.    Yes [provider]  Vitamin D, Ergocalciferol, (DRISDOL) 50000 units CAPS capsule take 1 capsule by mouth every week 08/09/16  Yes Noralee Space, MD  dicyclomine (BENTYL) 20 MG tablet Take 1/2-1 tablet by mouth up to 4 times a day as needed for abdominal cramping Patient not taking: Reported on 01/03/2017 07/27/14   Noralee Space, MD  ferrous sulfate (IRON SUPPLEMENT) 325 (65 FE) MG tablet Take 1 tablet (325 mg total) by mouth daily with breakfast. Patient not taking: Reported on 01/03/2017 07/23/16   Willia Craze, NP    Physical Exam: Vitals:   01/03/17 1730 01/03/17 1830 01/03/17 1845 01/03/17 1900  BP: (!) 152/80  131/82 126/69  Pulse: 72 75 69   Resp: (!) 21 14 (!) 21 19  SpO2: 96% 93% 95% 96%      Constitutional: Moderately built and nourished. Vitals:   01/03/17 1730 01/03/17 1830 01/03/17 1845 01/03/17 1900  BP: (!) 152/80  131/82 126/69  Pulse: 72 75 69   Resp: (!) 21 14 (!) 21 19  SpO2: 96% 93% 95% 96%   Eyes: Anicteric no pallor. ENMT: No discharge from the ears eyes nose or mouth. Neck: No mass felt.  No neck rigidity.  No JVD appreciated. Respiratory: No rhonchi or crepitations. Cardiovascular: S1-S2 heard no murmurs appreciated. Abdomen: Soft nontender bowel sounds present. Musculoskeletal: Pain on motion left hip. Skin: No rash. Neurologic: Alert awake oriented to time place and person.  Moves all extremities. Psychiatric: Appears normal.   Labs on Admission: I have personally reviewed following labs and imaging studies  CBC:  Recent Labs Lab 01/03/17 1646  WBC 5.6  HGB 13.6  HCT 39.5  MCV 71.8*  PLT 55*   Basic Metabolic Panel:  Recent Labs Lab  01/03/17 1646  NA 141  K 3.9  CL 108  CO2 24  GLUCOSE 115*  BUN 27*  CREATININE 1.95*  CALCIUM 8.9   GFR: CrCl cannot be calculated (Unknown ideal weight.). Liver Function Tests: No results for input(s): AST, ALT, ALKPHOS, BILITOT, PROT, ALBUMIN in the last 168 hours. No results for input(s): LIPASE, AMYLASE in the last 168 hours. No results for input(s): AMMONIA in the last 168 hours. Coagulation Profile: No results for input(s): INR, PROTIME in the last 168 hours. Cardiac Enzymes: No results for input(s): CKTOTAL, CKMB, CKMBINDEX, TROPONINI in the last 168 hours. BNP (last 3 results)  No results for input(s): PROBNP in the last 8760 hours. HbA1C: No results for input(s): HGBA1C in the last 72 hours. CBG: No results for input(s): GLUCAP in the last 168 hours. Lipid Profile: No results for input(s): CHOL, HDL, LDLCALC, TRIG, CHOLHDL, LDLDIRECT in the last 72 hours. Thyroid Function Tests: No results for input(s): TSH, T4TOTAL, FREET4, T3FREE, THYROIDAB in the last 72 hours. Anemia Panel: No results for input(s): VITAMINB12, FOLATE, FERRITIN, TIBC, IRON, RETICCTPCT in the last 72 hours. Urine analysis:    Component Value Date/Time   COLORURINE YELLOW 06/16/2016 1622   APPEARANCEUR CLEAR 06/16/2016 1622   LABSPEC 1.016 06/16/2016 1622   PHURINE 5.0 06/16/2016 1622   GLUCOSEU NEGATIVE 06/16/2016 1622   GLUCOSEU NEGATIVE 05/26/2007 1157   HGBUR MODERATE (A) 06/16/2016 1622   BILIRUBINUR NEGATIVE 06/16/2016 1622   KETONESUR NEGATIVE 06/16/2016 1622   PROTEINUR 30 (A) 06/16/2016 1622   UROBILINOGEN 0.2 11/15/2012 2135   NITRITE NEGATIVE 06/16/2016 1622   LEUKOCYTESUR TRACE (A) 06/16/2016 1622   Sepsis Labs: @LABRCNTIP (procalcitonin:4,lacticidven:4) )No results found for this or any previous visit (from the past 240 hour(s)).   Radiological Exams on Admission: Dg Chest 1 View  Result Date: 01/03/2017 CLINICAL DATA:  Preop left hip fracture. EXAM: CHEST 1 VIEW  COMPARISON:  04/13/2016 FINDINGS: Cardiomegaly with large hiatal hernia. Aortic atherosclerosis without aneurysm. Atelectasis at the lung bases without pneumonic consolidation or CHF. No effusion or pneumothorax. No acute osseous abnormality of bony thorax. Osteoarthritic spurring about the included left shoulder. IMPRESSION: 1. Stable cardiomegaly with large hiatal hernia and bibasilar atelectasis. 2. No focal pneumonia. 3. Aortic atherosclerosis. Electronically Signed   By: Ashley Royalty M.D.   On: 01/03/2017 17:44   Dg Hip Unilat W Or W/o Pelvis 2-3 Views Left  Result Date: 01/03/2017 CLINICAL DATA:  Left hip pain after fall today. EXAM: DG HIP (WITH OR WITHOUT PELVIS) 2-3V LEFT COMPARISON:  05/08/2015 FINDINGS: There is an acute, closed, intertrochanteric fracture of the left femur with avulsed lesser trochanter. Osteoarthritic joint space narrowing of the left hip. Intact bony pelvis, pubic rami and partially included right uncemented hip arthroplasty. Lower lumbar degenerative facet arthropathy. IMPRESSION: 1. Acute, closed, varus angulated intertrochanteric fracture of the left femur with avulsed lesser trochanter. 2. Lower lumbar degenerative facet arthropathy. 3. Intact bony pelvis and included right hip arthroplasty. Electronically Signed   By: Ashley Royalty M.D.   On: 01/03/2017 17:47    EKG: Independently reviewed.  Atrial fibrillation/flutter nonspecific ST-T changes.  Assessment/Plan Active Problems:   Essential hypertension   ATRIAL FLUTTER, PAROXYSMAL   Peripheral vascular disease (HCC)   Asthmatic bronchitis   Closed left hip fracture (HCC)   Hip fracture (HCC)    1. Left hip fracture status post mechanical fall -appreciate orthopedic surgery consult.  Plan is to have surgery after 48 hours since patient was on apixaban.  Patient will be at moderate to high risk for intermediate risk procedure.  We will keep patient on pain relief medications.  Further recommendations per  orthopedics. 2. Atrial flutter/fibrillation -on amiodarone and metoprolol.  Holding apixaban due to possible surgery.  Patient agreeable. 3. History of CAD status post stenting -last cardiac cath this February was unremarkable.  Patient denies any chest pain.  On beta-blockers and statins.  Please notify patient's cardiologist Dr. Terrence Dupont in a.m. 4. Hypertension -we will hold Cozaar due to chronic renal failure.  Continue beta-blockers and amlodipine.  Will place patient on as needed IV hydralazine. 5. Chronic kidney disease stage III-IV -creatinine  appears to be at baseline.  Follow metabolic panel. 6. Thrombocytopenia -reviewing patient's labs patient had thrombocytopenia in September also.  Cause not clear.  Closely follow CBC.  I have reviewed patient's old charts and labs.   DVT prophylaxis: SCDs. Code Status: Full code. Family Communication: Patient's daughter. Disposition Plan: Home. Consults called: Orthopedics. Admission status: Inpatient.   Rise Patience MD [facility] Pager [phone].  If 7PM-7AM, please contact night-coverage [email or web address] Password [facility]  01/03/2017, 7:14 PM

## 2017-01-03 NOTE — ED Notes (Signed)
Patient transported to X-ray 

## 2017-01-04 DIAGNOSIS — I739 Peripheral vascular disease, unspecified: Secondary | ICD-10-CM

## 2017-01-04 DIAGNOSIS — I10 Essential (primary) hypertension: Secondary | ICD-10-CM

## 2017-01-04 LAB — BASIC METABOLIC PANEL
Anion gap: 7 (ref 5–15)
BUN: 27 mg/dL — AB (ref 6–20)
CALCIUM: 8.3 mg/dL — AB (ref 8.9–10.3)
CO2: 24 mmol/L (ref 22–32)
CREATININE: 1.94 mg/dL — AB (ref 0.44–1.00)
Chloride: 106 mmol/L (ref 101–111)
GFR calc non Af Amer: 22 mL/min — ABNORMAL LOW (ref >60)
GFR, EST AFRICAN AMERICAN: 25 mL/min — AB (ref >60)
GLUCOSE: 121 mg/dL — AB (ref 65–99)
Potassium: 4 mmol/L (ref 3.5–5.1)
Sodium: 137 mmol/L (ref 135–145)

## 2017-01-04 LAB — CBC
HCT: 32.6 % — ABNORMAL LOW (ref 36.0–46.0)
Hemoglobin: 10.6 g/dL — ABNORMAL LOW (ref 12.0–15.0)
MCH: 23.5 pg — AB (ref 26.0–34.0)
MCHC: 32.5 g/dL (ref 30.0–36.0)
MCV: 72.1 fL — AB (ref 78.0–100.0)
PLATELETS: DECREASED 10*3/uL (ref 150–400)
RBC: 4.52 MIL/uL (ref 3.87–5.11)
WBC: 9 10*3/uL (ref 4.0–10.5)

## 2017-01-04 LAB — APTT: APTT: 134 s — AB (ref 24–36)

## 2017-01-04 LAB — HEPARIN LEVEL (UNFRACTIONATED): HEPARIN UNFRACTIONATED: 1.68 [IU]/mL — AB (ref 0.30–0.70)

## 2017-01-04 MED ORDER — HYDRALAZINE HCL 20 MG/ML IJ SOLN: 10.0000 mg | INTRAMUSCULAR | Status: DC | PRN

## 2017-01-04 MED ORDER — ENSURE ENLIVE PO LIQD
237.0000 mL | Freq: Two times a day (BID) | ORAL | Status: DC
  Administered 2017-01-07 – 2017-01-11 (×7): 237 mL via ORAL

## 2017-01-04 MED ORDER — HEPARIN (PORCINE) IN NACL 100-0.45 UNIT/ML-% IJ SOLN
650.0000 [IU]/h | INTRAMUSCULAR | Status: DC
  Administered 2017-01-04: 800 [IU]/h via INTRAVENOUS
  Administered 2017-01-06: 600 [IU]/h via INTRAVENOUS
  Filled 2017-01-04 (×3): qty 250

## 2017-01-04 MED ORDER — HEPARIN BOLUS VIA INFUSION
3000.0000 [IU] | Freq: Once | INTRAVENOUS | Status: AC
  Administered 2017-01-04: 3000 [IU] via INTRAVENOUS
  Filled 2017-01-04: qty 3000

## 2017-01-04 NOTE — Progress Notes (Signed)
Patient did not void since admission last night.  Tried warm compressions with no success.  Bladder scan showed 540 mL.  Tried to I/O cath x 3 times, no success.  Got patient up to [facility] with 2 assist.  Patient voided 150 mL.  Will have day shift nurse to follow up.

## 2017-01-04 NOTE — Progress Notes (Signed)
Orthopedic Tech Progress Note Patient Details:  Virginia Casey 10-28-1924 [id number]  Patient ID: [name], female   DOB: 12-28-1924, 83 y.o.   MRN: [id number]   [place] Pink 01/04/2017, 8:10 AMUnable to use Trapeze bar.

## 2017-01-04 NOTE — Progress Notes (Signed)
    Subjective:   Procedure(s) (LRB): LEFT HIP  INTERTROCHANTRIC  NAIL (Left) Patient reports pain as 2 on 0-10 scale.   Denies CP or SOB.  Voiding without difficulty. Positive flatus. Objective: Vital signs in last 24 hours: Temp:  [97.5 F (36.4 C)-97.9 F (36.6 C)] 97.9 F (36.6 C) (11/03 0414) Pulse Rate:  [63-76] 68 (11/03 0414) Resp:  [14-23] 16 (11/03 0414) BP: (99-152)/(51-82) 99/51 (11/03 0414) SpO2:  [91 %-97 %] 95 % (11/03 0414)  Intake/Output from previous day: 11/02 0701 - 11/03 0700 In: 500 [IV Piggyback:500] Out: -  Intake/Output this shift: Total I/O In: -  Out: 150 [Urine:150]  Labs:  Recent Labs  01/03/17 1646 01/04/17 0508  HGB 13.6 10.6*    Recent Labs  01/03/17 1646 01/04/17 0508  WBC 5.6 9.0  RBC 5.50* 4.52  HCT 39.5 32.6*  PLT 55* PLATELET CLUMPS NOTED ON SMEAR, COUNT APPEARS DECREASED    Recent Labs  01/03/17 1646 01/04/17 0508  NA 141 137  K 3.9 4.0  CL 108 106  CO2 24 24  BUN 27* 27*  CREATININE 1.95* 1.94*  GLUCOSE 115* 121*  CALCIUM 8.9 8.3*   No results for input(s): LABPT, INR in the last 72 hours.  Physical Exam: Neurologically intact ABD soft Compartment soft  Assessment/Plan:   Procedure(s) (LRB): LEFT HIP  INTERTROCHANTRIC  NAIL (Left) Awaiting surgical management of hp fracture No change to current plan  Jaysiah Marchetta D for Dr. Melina Schools [facility] [phone] 01/04/2017, 9:07 AM

## 2017-01-04 NOTE — Progress Notes (Signed)
Nutrition Brief Note  Patient identified on the Malnutrition Screening Tool (MST) Report  Virginia Casey is a 83 y.o. female with history of CAD status post stenting, atrial fibrillation/flutter, hypertension, chronic kidney disease, anemia and thrombocytopenia was brought to the ER after patient had a fall  Now awaiting a L H Intertrochantric nail for L Hip Fx. She had beef tips with noodles this morning, green beans, a roll and pears. Ate most of it. Reports good PO intake PTA. Normally eats oatmeal and fruit for breakfast. A [place] sandwich with soup for dinner. Reports 5 pound unintentional weight loss recently but weight has fluctuated steadily over the past 2 years. NFPE normal with the exception of mild buccal fat wasting. Denies any nausea,vomiting or issues chewing/swallowing/choking.  Wt Readings from Last 15 Encounters:  01/04/17 155 lb (70.3 kg)  11/25/16 145 lb 2 oz (65.8 kg)  10/31/16 144 lb (65.3 kg)  10/23/16 144 lb (65.3 kg)  09/30/16 142 lb 4 oz (64.5 kg)  07/16/16 153 lb (69.4 kg)  06/25/16 153 lb 6 oz (69.6 kg)  04/15/16 155 lb 6.4 oz (70.5 kg)  04/01/16 154 lb 9.6 oz (70.1 kg)  09/27/15 154 lb 4 oz (70 kg)  07/21/15 156 lb (70.8 kg)  05/12/15 154 lb 6.4 oz (70 kg)  05/08/15 155 lb (70.3 kg)  03/30/15 155 lb 6.4 oz (70.5 kg)  09/27/14 159 lb 6.4 oz (72.3 kg)    Body mass index is 28.35 kg/m. Patient meets criteria for overweight based on current BMI.   Current diet order is heart healthy, patient is consuming approximately 75% of meals at this time.  No nutrition interventions warranted at this time. If nutrition issues arise, please consult RD.   Labs reviewed  Medications reviewed and include:  Vitamin D  Virginia Anis. Virginia Barnier, MS, RD LDN Inpatient Clinical Dietitian Pager [phone]

## 2017-01-04 NOTE — Progress Notes (Signed)
PROGRESS NOTE    MALIYAH WILLETS  YWV:[id number] DOB: 1924-07-09 DOA: 01/03/2017 PCP: Noralee Space, MD   Brief Narrative: DEBORH PENSE is a 83 y.o. female with a history of CAD status post stents, atrial fibrillation/flutter, essential hypertension, chronic kidney disease, anemia, thrombocytopenia.  She presented after a fall and was found to have a left hip hip fracture.  Orthopedic surgery was consulted and is recommending surgery on Monday, November 5 secondary to chronic anticoagulation use.  Trend   Assessment & Plan:   Active Problems:   Essential hypertension   ATRIAL FLUTTER, PAROXYSMAL   Peripheral vascular disease (HCC)   Asthmatic bronchitis   Closed left hip fracture (HCC)   Hip fracture (HCC)   Left hip fracture -orthopedic surgery recommendations: surgery on 11/5 -hydrocodone prn  Atrial flutter -continue metoprolool and amiodarone -discontinue Eliquis -heparin bridge  Essential hypertension -continue amlodipine  Urinary retention Voided 139mL yesterday. Watch uop today. If still retaining, will need a foley catheter  Hyperlipidemia -continue Crestor   DVT prophylaxis: SCDs, Heparin drip Code Status: Full code Family Communication: None at bedside Disposition Plan: Discharge pending orthopedic recommendations   Consultants:   Orthopedic surgery  Procedures:   None  Antimicrobials:  None    Subjective: Pain manageable.  Objective: Vitals:   01/03/17 1930 01/03/17 2033 01/04/17 0414 01/04/17 1047  BP: 128/79 121/73 (!) 99/51   Pulse: 67 66 68   Resp: 17 16 16    Temp:  (!) 97.5 F (36.4 C) 97.9 F (36.6 C)   TempSrc:  Oral Oral   SpO2: 93% 97% 95%   Height:    5\' 2"  (1.575 m)    Intake/Output Summary (Last 24 hours) at 01/04/17 1124 Last data filed at 01/04/17 0900  Gross per 24 hour  Intake              740 ml  Output              150 ml  Net              590 ml   There were no vitals filed for this  visit.  Examination:  General exam: Appears calm and comfortable Respiratory system: Clear to auscultation. Respiratory effort normal. Cardiovascular system: S1 & S2 heard, normal rate, irregular rhythm. No murmurs, rubs, gallops or clicks. Gastrointestinal system: Abdomen is nondistended, soft and nontender. Normal bowel sounds heard. Central nervous system: Alert and oriented. No focal neurological deficits. Extremities: No edema. No calf tenderness Skin: No cyanosis. No rashes Psychiatry: Judgement and insight appear normal. Mood & affect appropriate.     Data Reviewed: I have personally reviewed following labs and imaging studies  CBC:  Recent Labs Lab 01/03/17 1646 01/04/17 0508  WBC 5.6 9.0  HGB 13.6 10.6*  HCT 39.5 32.6*  MCV 71.8* 72.1*  PLT 55* PLATELET CLUMPS NOTED ON SMEAR, COUNT APPEARS DECREASED   Basic Metabolic Panel:  Recent Labs Lab 01/03/17 1646 01/04/17 0508  NA 141 137  K 3.9 4.0  CL 108 106  CO2 24 24  GLUCOSE 115* 121*  BUN 27* 27*  CREATININE 1.95* 1.94*  CALCIUM 8.9 8.3*   GFR: CrCl cannot be calculated (Unknown ideal weight.). Liver Function Tests: No results for input(s): AST, ALT, ALKPHOS, BILITOT, PROT, ALBUMIN in the last 168 hours. No results for input(s): LIPASE, AMYLASE in the last 168 hours. No results for input(s): AMMONIA in the last 168 hours. Coagulation Profile: No results for input(s): INR, PROTIME in the  last 168 hours. Cardiac Enzymes: No results for input(s): CKTOTAL, CKMB, CKMBINDEX, TROPONINI in the last 168 hours. BNP (last 3 results) No results for input(s): PROBNP in the last 8760 hours. HbA1C: No results for input(s): HGBA1C in the last 72 hours. CBG: No results for input(s): GLUCAP in the last 168 hours. Lipid Profile: No results for input(s): CHOL, HDL, LDLCALC, TRIG, CHOLHDL, LDLDIRECT in the last 72 hours. Thyroid Function Tests: No results for input(s): TSH, T4TOTAL, FREET4, T3FREE, THYROIDAB in the  last 72 hours. Anemia Panel: No results for input(s): VITAMINB12, FOLATE, FERRITIN, TIBC, IRON, RETICCTPCT in the last 72 hours. Sepsis Labs: No results for input(s): PROCALCITON, LATICACIDVEN in the last 168 hours.  No results found for this or any previous visit (from the past 240 hour(s)).       Radiology Studies: Dg Chest 1 View  Result Date: 01/03/2017 CLINICAL DATA:  Preop left hip fracture. EXAM: CHEST 1 VIEW COMPARISON:  04/13/2016 FINDINGS: Cardiomegaly with large hiatal hernia. Aortic atherosclerosis without aneurysm. Atelectasis at the lung bases without pneumonic consolidation or CHF. No effusion or pneumothorax. No acute osseous abnormality of bony thorax. Osteoarthritic spurring about the included left shoulder. IMPRESSION: 1. Stable cardiomegaly with large hiatal hernia and bibasilar atelectasis. 2. No focal pneumonia. 3. Aortic atherosclerosis. Electronically Signed   By: Ashley Royalty M.D.   On: 01/03/2017 17:44   Dg Hip Unilat W Or W/o Pelvis 2-3 Views Left  Result Date: 01/03/2017 CLINICAL DATA:  Left hip pain after fall today. EXAM: DG HIP (WITH OR WITHOUT PELVIS) 2-3V LEFT COMPARISON:  05/08/2015 FINDINGS: There is an acute, closed, intertrochanteric fracture of the left femur with avulsed lesser trochanter. Osteoarthritic joint space narrowing of the left hip. Intact bony pelvis, pubic rami and partially included right uncemented hip arthroplasty. Lower lumbar degenerative facet arthropathy. IMPRESSION: 1. Acute, closed, varus angulated intertrochanteric fracture of the left femur with avulsed lesser trochanter. 2. Lower lumbar degenerative facet arthropathy. 3. Intact bony pelvis and included right hip arthroplasty. Electronically Signed   By: Ashley Royalty M.D.   On: 01/03/2017 17:47        Scheduled Meds: . amiodarone  200 mg Oral Daily  . amLODipine  2.5 mg Oral Daily  . metoprolol succinate  50 mg Oral Daily  . pantoprazole  40 mg Oral BID  . rosuvastatin  20 mg  Oral QHS  . Vitamin D (Ergocalciferol)  50,000 Units Oral Weekly   Continuous Infusions:   LOS: 1 day     Cordelia Poche, MD [facility] 01/04/2017, 11:24 AM Pager: [phone]  If 7PM-7AM, please contact night-coverage [email or web address] Password [email or web address] 01/04/2017, 11:24 AM

## 2017-01-04 NOTE — Progress Notes (Signed)
ANTICOAGULATION CONSULT NOTE - Initial Consult  Pharmacy Consult:  Heparin Indication: atrial fibrillation  No Known Allergies  Patient Measurements: Height: 5\' 2"  (157.5 cm) (per chart review) Weight: 155 lb (70.3 kg) IBW/kg (Calculated) : 50.1 Heparin Dosing Weight: 65 kg  Vital Signs: Temp: 97.9 F (36.6 C) (11/03 0414) Temp Source: Oral (11/03 0414) BP: 99/51 (11/03 0414) Pulse Rate: 68 (11/03 0414)  Labs:  Recent Labs  01/03/17 1646 01/04/17 0508  HGB 13.6 10.6*  HCT 39.5 32.6*  PLT 55* PLATELET CLUMPS NOTED ON SMEAR, COUNT APPEARS DECREASED  CREATININE 1.95* 1.94*    Estimated Creatinine Clearance: 18.1 mL/min (A) (by C-G formula based on SCr of 1.94 mg/dL (H)).   Medical History: Past Medical History:  Diagnosis Date  . Anemia   . Anxiety   . Atherosclerotic heart disease   . Atrial flutter, paroxysmal ([place])   . Colon polyp   . Diverticulosis of colon   . DJD (degenerative joint disease)    "legs" (10/06/2012)  . Exertional shortness of breath   . GERD (gastroesophageal reflux disease)   . Hiatal hernia   . History of blood transfusion ? 2010  . HTN (hypertension)   . Hypercholesterolemia   . Myocardial infarction ([place]) 2010  . Pneumonia    "once; years ago" (10/06/2012)  . PVD (peripheral vascular disease) ([place])   . Vitamin D deficiency      Assessment: 64 YOF with history if Afib on Eliquis PTA, last dose on 01/02/17.  Eliquis placed on hold for surgery and Pharmacy consulted to initiate IV heparin bridge.  Noted patient has a history of thrombocytopenia with range 74-184K.  No bleeding currently.     Goal of Therapy:  Heparin level 0.3-0.7 units/ml aPTT 66 - 102 seconds Monitor platelets by anticoagulation protocol: Yes    Plan:  Heparin 3000 units IV bolus x 1, then Heparin gtt at 800 units/hr Check 8 hr heparin level and aPTT Daily heparin level, aPTT, CBC   Ottis Sarnowski D. Mina Marble, PharmD, BCPS Pager:  [phone] 01/04/2017, 2:18 PM

## 2017-01-04 NOTE — Clinical Social Work Note (Signed)
CSW acknowledges SNF consult. Please enter PT order when medically ready.  Dayton Scrape, [place]

## 2017-01-05 DIAGNOSIS — D72829 Elevated white blood cell count, unspecified: Secondary | ICD-10-CM

## 2017-01-05 LAB — CBC
HEMATOCRIT: 31.5 % — AB (ref 36.0–46.0)
HEMOGLOBIN: 10.3 g/dL — AB (ref 12.0–15.0)
MCH: 23.5 pg — AB (ref 26.0–34.0)
MCHC: 32.7 g/dL (ref 30.0–36.0)
MCV: 71.9 fL — AB (ref 78.0–100.0)
Platelets: 76 10*3/uL — ABNORMAL LOW (ref 150–400)
RBC: 4.38 MIL/uL (ref 3.87–5.11)
RDW: 27 % — ABNORMAL HIGH (ref 11.5–15.5)
WBC: 12.3 10*3/uL — ABNORMAL HIGH (ref 4.0–10.5)

## 2017-01-05 LAB — BASIC METABOLIC PANEL
Anion gap: 10 (ref 5–15)
BUN: 29 mg/dL — AB (ref 6–20)
CHLORIDE: 104 mmol/L (ref 101–111)
CO2: 22 mmol/L (ref 22–32)
CREATININE: 2.07 mg/dL — AB (ref 0.44–1.00)
Calcium: 8.8 mg/dL — ABNORMAL LOW (ref 8.9–10.3)
GFR calc non Af Amer: 20 mL/min — ABNORMAL LOW (ref >60)
GFR, EST AFRICAN AMERICAN: 23 mL/min — AB (ref >60)
GLUCOSE: 120 mg/dL — AB (ref 65–99)
Potassium: 4.3 mmol/L (ref 3.5–5.1)
Sodium: 136 mmol/L (ref 135–145)

## 2017-01-05 LAB — APTT: aPTT: 77 seconds — ABNORMAL HIGH (ref 24–36)

## 2017-01-05 NOTE — Progress Notes (Signed)
ANTICOAGULATION CONSULT NOTE - Initial Consult  Pharmacy Consult:  Heparin Indication: atrial fibrillation  No Known Allergies  Patient Measurements: Height: 5\' 2"  (157.5 cm)(per chart review) Weight: 155 lb (70.3 kg) IBW/kg (Calculated) : 50.1 Heparin Dosing Weight: 65 kg  Vital Signs: Temp: 97.1 F (36.2 C) (11/04 0426) Temp Source: Axillary (11/04 0426) BP: 87/49 (11/04 1057) Pulse Rate: 79 (11/04 1057)  Labs: Recent Labs    01/03/17 1646 01/04/17 0508 01/04/17 2252 01/05/17 0856  HGB 13.6 10.6*  --  10.3*  HCT 39.5 32.6*  --  31.5*  PLT 55* PLATELET CLUMPS NOTED ON SMEAR, COUNT APPEARS DECREASED  --  76*  APTT  --   --  134* 77*  HEPARINUNFRC  --   --  1.68*  --   CREATININE 1.95* 1.94*  --  2.07*    Estimated Creatinine Clearance: 16.9 mL/min (A) (by C-G formula based on SCr of 2.07 mg/dL (H)).    Assessment: 61 YOF with history of Afib on Eliquis PTA, last dose on 01/02/17.  Eliquis placed on hold for surgery and Pharmacy consulted to manage IV heparin bridge.  Currently using aPTT to guide heparin dosing and aPTT is therapeutic.  Patient has a history of thrombocytopenia with range 74-184K; no bleeding currently.     Goal of Therapy:  Heparin level 0.3-0.7 units/ml aPTT 66 - 102 seconds Monitor platelets by anticoagulation protocol: Yes    Plan:  Continue heparin gtt at 600 units/hr Daily heparin level, aPTT, CBC Consider holding Norvasc or Toprol d/t low BP   Fatou Dunnigan D. Mina Marble, PharmD, BCPS Pager:  [phone] 01/05/2017, 1:54 PM

## 2017-01-05 NOTE — Progress Notes (Signed)
PROGRESS NOTE    Virginia Casey  IEP:[id number] DOB: 1924/10/22 DOA: 01/03/2017 PCP: Noralee Space, MD   Brief Narrative: Virginia Casey is a 83 y.o. female with a history of CAD status post stents, atrial fibrillation/flutter, essential hypertension, chronic kidney disease, anemia, thrombocytopenia.  She presented after a fall and was found to have a left hip hip fracture.  Orthopedic surgery was consulted and is recommending surgery on Monday, November 5 secondary to chronic anticoagulation use.   Assessment & Plan:   Active Problems:   Essential hypertension   ATRIAL FLUTTER, PAROXYSMAL   Peripheral vascular disease (HCC)   Asthmatic bronchitis   Closed left hip fracture (HCC)   Hip fracture (HCC)   Left hip fracture -orthopedic surgery recommendations: surgery on 11/5 -hydrocodone prn  Atrial flutter -continue metoprolool and amiodarone -holding Eliquis -heparin bridge  Essential hypertension -continue amlodipine and metoprolol  Urinary retention Transient. Voiding. +550 mL since admission -continue to watch UOP  Hyperlipidemia -continue Crestor  Leukocytosis Likely stress related. Afebrile. No source for infection. -trend CBC    DVT prophylaxis: SCDs, Heparin drip Code Status: Full code Family Communication: None at bedside Disposition Plan: Discharge pending orthopedic recommendations   Consultants:   Orthopedic surgery  Procedures:   None  Antimicrobials:  None    Subjective: Afebrile. Pain continues to be manageable.  Objective: Vitals:   01/04/17 1300 01/04/17 2107 01/05/17 0426 01/05/17 1057  BP: (!) 97/51 116/71 (!) 96/58 (!) 87/49  Pulse: 70 100 86 79  Resp: 17 14 12    Temp: 98.4 F (36.9 C) 98.9 F (37.2 C) (!) 97.1 F (36.2 C)   TempSrc: Oral Oral Axillary   SpO2: 100% 94% 92%   Weight: 70.3 kg (155 lb)     Height:        Intake/Output Summary (Last 24 hours) at 01/05/2017 1230 Last data filed at 01/05/2017 0900 Gross  per 24 hour  Intake 360 ml  Output 400 ml  Net -40 ml   Filed Weights   01/04/17 1300  Weight: 70.3 kg (155 lb)    Examination:  General exam: Appears calm and comfortable Respiratory system: Clear to auscultation bilaterally. Unlabored work of breathing. No wheezing or rales. Cardiovascular system: S1 & S2 heard, normal rate, irregular rhythm.  Gastrointestinal system: Abdomen is nondistended, soft and nontender. Normal bowel sounds heard. Central nervous system: Alert and oriented. No focal neurological deficits. Extremities: No edema. No calf tenderness Skin: No cyanosis. No rashes Psychiatry: Judgement and insight appear normal. Mood & affect appropriate.     Data Reviewed: I have personally reviewed following labs and imaging studies  CBC: Recent Labs  Lab 01/03/17 1646 01/04/17 0508 01/05/17 0856  WBC 5.6 9.0 12.3*  HGB 13.6 10.6* 10.3*  HCT 39.5 32.6* 31.5*  MCV 71.8* 72.1* 71.9*  PLT 55* PLATELET CLUMPS NOTED ON SMEAR, COUNT APPEARS DECREASED 76*   Basic Metabolic Panel: Recent Labs  Lab 01/03/17 1646 01/04/17 0508 01/05/17 0856  NA 141 137 136  K 3.9 4.0 4.3  CL 108 106 104  CO2 24 24 22   GLUCOSE 115* 121* 120*  BUN 27* 27* 29*  CREATININE 1.95* 1.94* 2.07*  CALCIUM 8.9 8.3* 8.8*   GFR: Estimated Creatinine Clearance: 16.9 mL/min (A) (by C-G formula based on SCr of 2.07 mg/dL (H)). Liver Function Tests: No results for input(s): AST, ALT, ALKPHOS, BILITOT, PROT, ALBUMIN in the last 168 hours. No results for input(s): LIPASE, AMYLASE in the last 168 hours. No results for  input(s): AMMONIA in the last 168 hours. Coagulation Profile: No results for input(s): INR, PROTIME in the last 168 hours. Cardiac Enzymes: No results for input(s): CKTOTAL, CKMB, CKMBINDEX, TROPONINI in the last 168 hours. BNP (last 3 results) No results for input(s): PROBNP in the last 8760 hours. HbA1C: No results for input(s): HGBA1C in the last 72 hours. CBG: No results  for input(s): GLUCAP in the last 168 hours. Lipid Profile: No results for input(s): CHOL, HDL, LDLCALC, TRIG, CHOLHDL, LDLDIRECT in the last 72 hours. Thyroid Function Tests: No results for input(s): TSH, T4TOTAL, FREET4, T3FREE, THYROIDAB in the last 72 hours. Anemia Panel: No results for input(s): VITAMINB12, FOLATE, FERRITIN, TIBC, IRON, RETICCTPCT in the last 72 hours. Sepsis Labs: No results for input(s): PROCALCITON, LATICACIDVEN in the last 168 hours.  No results found for this or any previous visit (from the past 240 hour(s)).       Radiology Studies: Dg Chest 1 View  Result Date: 01/03/2017 CLINICAL DATA:  Preop left hip fracture. EXAM: CHEST 1 VIEW COMPARISON:  04/13/2016 FINDINGS: Cardiomegaly with large hiatal hernia. Aortic atherosclerosis without aneurysm. Atelectasis at the lung bases without pneumonic consolidation or CHF. No effusion or pneumothorax. No acute osseous abnormality of bony thorax. Osteoarthritic spurring about the included left shoulder. IMPRESSION: 1. Stable cardiomegaly with large hiatal hernia and bibasilar atelectasis. 2. No focal pneumonia. 3. Aortic atherosclerosis. Electronically Signed   By: Ashley Royalty M.D.   On: 01/03/2017 17:44   Dg Hip Unilat W Or W/o Pelvis 2-3 Views Left  Result Date: 01/03/2017 CLINICAL DATA:  Left hip pain after fall today. EXAM: DG HIP (WITH OR WITHOUT PELVIS) 2-3V LEFT COMPARISON:  05/08/2015 FINDINGS: There is an acute, closed, intertrochanteric fracture of the left femur with avulsed lesser trochanter. Osteoarthritic joint space narrowing of the left hip. Intact bony pelvis, pubic rami and partially included right uncemented hip arthroplasty. Lower lumbar degenerative facet arthropathy. IMPRESSION: 1. Acute, closed, varus angulated intertrochanteric fracture of the left femur with avulsed lesser trochanter. 2. Lower lumbar degenerative facet arthropathy. 3. Intact bony pelvis and included right hip arthroplasty. Electronically  Signed   By: Ashley Royalty M.D.   On: 01/03/2017 17:47        Scheduled Meds: . amiodarone  200 mg Oral Daily  . amLODipine  2.5 mg Oral Daily  . feeding supplement (ENSURE ENLIVE)  237 mL Oral BID BM  . metoprolol succinate  50 mg Oral Daily  . pantoprazole  40 mg Oral BID  . rosuvastatin  20 mg Oral QHS  . Vitamin D (Ergocalciferol)  50,000 Units Oral Weekly   Continuous Infusions: . heparin 600 Units/hr (01/05/17 0015)     LOS: 2 days     Cordelia Poche, MD [facility] 01/05/2017, 12:30 PM Pager: [phone]  If 7PM-7AM, please contact night-coverage [email or web address] Password [email or web address] 01/05/2017, 12:30 PM

## 2017-01-05 NOTE — Progress Notes (Signed)
RN notified MD of patients blood pressure. Nursing will continue to monitor.

## 2017-01-05 NOTE — Progress Notes (Signed)
Subjective: L hip fx awaiting surgery No other c/o. Hip pain with motion   Objective: Vital signs in last 24 hours: Temp:  [97.1 F (36.2 C)-98.9 F (37.2 C)] 97.1 F (36.2 C) (11/04 0426) Pulse Rate:  [70-100] 79 (11/04 1057) Resp:  [12-17] 12 (11/04 0426) BP: (87-116)/(49-71) 87/49 (11/04 1057) SpO2:  [92 %-100 %] 92 % (11/04 0426) Weight:  [70.3 kg (155 lb)] 70.3 kg (155 lb) (11/03 1300)  Intake/Output from previous day: 11/03 0701 - 11/04 0700 In: 480 [P.O.:480] Out: 550 [Urine:550] Intake/Output this shift: Total I/O In: 120 [P.O.:120] Out: -   Recent Labs    01/03/17 1646 01/04/17 0508 01/05/17 0856  HGB 13.6 10.6* 10.3*   Recent Labs    01/04/17 0508 01/05/17 0856  WBC 9.0 12.3*  RBC 4.52 4.38  HCT 32.6* 31.5*  PLT PLATELET CLUMPS NOTED ON SMEAR, COUNT APPEARS DECREASED PENDING   Recent Labs    01/04/17 0508 01/05/17 0856  NA 137 136  K 4.0 4.3  CL 106 104  CO2 24 22  BUN 27* 29*  CREATININE 1.94* 2.07*  GLUCOSE 121* 120*  CALCIUM 8.3* 8.8*   No results for input(s): LABPT, INR in the last 72 hours.  Neurologically intact ABD soft Neurovascular intact Sensation intact distally Intact pulses distally Dorsiflexion/Plantar flexion intact No cellulitis present Compartment soft no calf pain or sign of DVT  Assessment/Plan: L hip fx awaiting surgery with Dr. Stann Mainland tomorrow NPO p MN for surgery   Virginia Casey M. 01/05/2017, 11:19 AM

## 2017-01-05 NOTE — Progress Notes (Signed)
ANTICOAGULATION CONSULT NOTE - Follow Up Consult  Pharmacy Consult for heparin Indication: atrial fibrillation  Labs:  Recent Labs  01/03/17 1646 01/04/17 0508 01/04/17 2252  HGB 13.6 10.6*  --   HCT 39.5 32.6*  --   PLT 55* PLATELET CLUMPS NOTED ON SMEAR, COUNT APPEARS DECREASED  --   APTT  --   --  134*  HEPARINUNFRC  --   --  1.68*  CREATININE 1.95* 1.94*  --     Assessment: 83yo female above goal on heparin with initial dosing while Eliquis is on hold.  Goal of Therapy:  aPTT 66-102 seconds   Plan:  Will decrease heparin gtt by 3 units/kg/hr to 600 units/hr and check PTT in 8hr.  Wynona Neat, PharmD, BCPS  01/05/2017,12:12 AM

## 2017-01-06 ENCOUNTER — Inpatient Hospital Stay (HOSPITAL_COMMUNITY): Payer: Medicare Other | Admitting: Certified Registered Nurse Anesthetist

## 2017-01-06 ENCOUNTER — Encounter (HOSPITAL_COMMUNITY): Payer: Self-pay | Admitting: Orthopedic Surgery

## 2017-01-06 ENCOUNTER — Encounter (HOSPITAL_COMMUNITY)
Admission: EM | Disposition: A | Discharge status: Hospice | Payer: Self-pay | Source: Home / Self Care | Attending: Internal Medicine

## 2017-01-06 ENCOUNTER — Encounter (HOSPITAL_COMMUNITY): Payer: Medicare Other

## 2017-01-06 ENCOUNTER — Inpatient Hospital Stay (HOSPITAL_COMMUNITY): Payer: Medicare Other

## 2017-01-06 HISTORY — PX: INTRAMEDULLARY (IM) NAIL INTERTROCHANTERIC: SHX5875

## 2017-01-06 LAB — CBC
HCT: 25.9 % — ABNORMAL LOW (ref 36.0–46.0)
Hemoglobin: 8.6 g/dL — ABNORMAL LOW (ref 12.0–15.0)
MCH: 23.6 pg — AB (ref 26.0–34.0)
MCHC: 33.2 g/dL (ref 30.0–36.0)
MCV: 71.2 fL — AB (ref 78.0–100.0)
PLATELETS: 73 10*3/uL — AB (ref 150–400)
RBC: 3.64 MIL/uL — AB (ref 3.87–5.11)
RDW: 26.7 % — ABNORMAL HIGH (ref 11.5–15.5)
WBC: 12.5 10*3/uL — ABNORMAL HIGH (ref 4.0–10.5)

## 2017-01-06 LAB — HEPARIN LEVEL (UNFRACTIONATED): Heparin Unfractionated: 0.86 IU/mL — ABNORMAL HIGH (ref 0.30–0.70)

## 2017-01-06 LAB — SURGICAL PCR SCREEN
MRSA, PCR: NEGATIVE
Staphylococcus aureus: NEGATIVE

## 2017-01-06 LAB — APTT
APTT: 103 s — AB (ref 24–36)
aPTT: 61 seconds — ABNORMAL HIGH (ref 24–36)

## 2017-01-06 SURGERY — FIXATION, FRACTURE, INTERTROCHANTERIC, WITH INTRAMEDULLARY ROD
Anesthesia: General | Site: Hip | Laterality: Left

## 2017-01-06 MED ORDER — ROCURONIUM BROMIDE 100 MG/10ML IV SOLN
INTRAVENOUS | Status: DC | PRN
  Administered 2017-01-06: 20 mg via INTRAVENOUS

## 2017-01-06 MED ORDER — METOCLOPRAMIDE HCL 10 MG PO TABS: 5.0000 mg | ORAL_TABLET | Freq: Three times a day (TID) | ORAL | Status: DC | PRN

## 2017-01-06 MED ORDER — ONDANSETRON HCL 4 MG/2ML IJ SOLN: 4.0000 mg | Freq: Four times a day (QID) | INTRAMUSCULAR | Status: DC | PRN

## 2017-01-06 MED ORDER — 0.9 % SODIUM CHLORIDE (POUR BTL) OPTIME
TOPICAL | Status: DC | PRN
  Administered 2017-01-06: 1000 mL

## 2017-01-06 MED ORDER — CEFAZOLIN SODIUM-DEXTROSE 2-4 GM/100ML-% IV SOLN
INTRAVENOUS | Status: AC
  Filled 2017-01-06: qty 100

## 2017-01-06 MED ORDER — ONDANSETRON HCL 4 MG/2ML IJ SOLN
INTRAMUSCULAR | Status: DC | PRN
  Administered 2017-01-06: 4 mg via INTRAVENOUS

## 2017-01-06 MED ORDER — DEXAMETHASONE SODIUM PHOSPHATE 4 MG/ML IJ SOLN
INTRAMUSCULAR | Status: DC | PRN
  Administered 2017-01-06: 10 mg via INTRAVENOUS

## 2017-01-06 MED ORDER — SODIUM CHLORIDE 0.9 % IV SOLN
INTRAVENOUS | Status: DC
  Administered 2017-01-06 – 2017-01-10 (×5): via INTRAVENOUS

## 2017-01-06 MED ORDER — HEPARIN (PORCINE) IN NACL 100-0.45 UNIT/ML-% IJ SOLN
700.0000 [IU]/h | INTRAMUSCULAR | Status: DC
  Administered 2017-01-07: 650 [IU]/h via INTRAVENOUS
  Filled 2017-01-06 (×2): qty 250

## 2017-01-06 MED ORDER — DEXAMETHASONE SODIUM PHOSPHATE 10 MG/ML IJ SOLN
INTRAMUSCULAR | Status: AC
  Filled 2017-01-06: qty 1

## 2017-01-06 MED ORDER — CEFAZOLIN SODIUM-DEXTROSE 1-4 GM/50ML-% IV SOLN
1.0000 g | Freq: Two times a day (BID) | INTRAVENOUS | Status: AC
  Administered 2017-01-07 (×2): 1 g via INTRAVENOUS
  Filled 2017-01-06 (×2): qty 50

## 2017-01-06 MED ORDER — ACETAMINOPHEN 650 MG RE SUPP: 650.0000 mg | RECTAL | Status: DC | PRN

## 2017-01-06 MED ORDER — ROCURONIUM BROMIDE 10 MG/ML (PF) SYRINGE
PREFILLED_SYRINGE | INTRAVENOUS | Status: AC
  Filled 2017-01-06: qty 10

## 2017-01-06 MED ORDER — DEXTROSE 5 % IV SOLN
INTRAVENOUS | Status: DC | PRN
  Administered 2017-01-06: 40 ug/min via INTRAVENOUS

## 2017-01-06 MED ORDER — HYDROCODONE-ACETAMINOPHEN 5-325 MG PO TABS: 1.0000 | ORAL_TABLET | Freq: Four times a day (QID) | ORAL | 0 refills | Status: DC | PRN

## 2017-01-06 MED ORDER — ONDANSETRON HCL 4 MG PO TABS: 4.0000 mg | ORAL_TABLET | Freq: Four times a day (QID) | ORAL | Status: DC | PRN

## 2017-01-06 MED ORDER — PROPOFOL 10 MG/ML IV BOLUS
INTRAVENOUS | Status: DC | PRN
  Administered 2017-01-06: 50 mg via INTRAVENOUS

## 2017-01-06 MED ORDER — FENTANYL CITRATE (PF) 100 MCG/2ML IJ SOLN
25.0000 ug | INTRAMUSCULAR | Status: DC | PRN
  Administered 2017-01-06: 50 ug via INTRAVENOUS

## 2017-01-06 MED ORDER — FENTANYL CITRATE (PF) 250 MCG/5ML IJ SOLN
INTRAMUSCULAR | Status: AC
  Filled 2017-01-06: qty 5

## 2017-01-06 MED ORDER — LIDOCAINE 2% (20 MG/ML) 5 ML SYRINGE
INTRAMUSCULAR | Status: AC
  Filled 2017-01-06: qty 10

## 2017-01-06 MED ORDER — ACETAMINOPHEN 325 MG PO TABS
650.0000 mg | ORAL_TABLET | ORAL | Status: DC | PRN
  Administered 2017-01-06 – 2017-01-11 (×9): 650 mg via ORAL
  Filled 2017-01-06 (×12): qty 2

## 2017-01-06 MED ORDER — CEFAZOLIN SODIUM-DEXTROSE 2-4 GM/100ML-% IV SOLN
2.0000 g | INTRAVENOUS | Status: AC
  Administered 2017-01-06: 2 g via INTRAVENOUS

## 2017-01-06 MED ORDER — ALBUMIN HUMAN 5 % IV SOLN
INTRAVENOUS | Status: DC | PRN
  Administered 2017-01-06: 18:00:00 via INTRAVENOUS

## 2017-01-06 MED ORDER — PROPOFOL 10 MG/ML IV BOLUS
INTRAVENOUS | Status: AC
  Filled 2017-01-06: qty 20

## 2017-01-06 MED ORDER — SUGAMMADEX SODIUM 200 MG/2ML IV SOLN
INTRAVENOUS | Status: DC | PRN
  Administered 2017-01-06: 150 mg via INTRAVENOUS

## 2017-01-06 MED ORDER — FENTANYL CITRATE (PF) 100 MCG/2ML IJ SOLN
INTRAMUSCULAR | Status: DC | PRN
  Administered 2017-01-06: 25 ug via INTRAVENOUS
  Administered 2017-01-06: 75 ug via INTRAVENOUS
  Administered 2017-01-06: 25 ug via INTRAVENOUS

## 2017-01-06 MED ORDER — PHENYLEPHRINE HCL 10 MG/ML IJ SOLN
INTRAMUSCULAR | Status: DC | PRN
  Administered 2017-01-06 (×4): 80 ug via INTRAVENOUS

## 2017-01-06 MED ORDER — FENTANYL CITRATE (PF) 100 MCG/2ML IJ SOLN
INTRAMUSCULAR | Status: AC
  Filled 2017-01-06: qty 2

## 2017-01-06 MED ORDER — METOCLOPRAMIDE HCL 5 MG/ML IJ SOLN: 5.0000 mg | Freq: Three times a day (TID) | INTRAMUSCULAR | Status: DC | PRN

## 2017-01-06 MED ORDER — LIDOCAINE HCL (CARDIAC) 20 MG/ML IV SOLN
INTRAVENOUS | Status: DC | PRN
  Administered 2017-01-06: 60 mg via INTRAVENOUS

## 2017-01-06 SURGICAL SUPPLY — 49 items
BIT DRILL FLUTED FEMUR 4.2/3 (BIT) ×2 IMPLANT
BLADE SURG 15 STRL LF DISP TIS (BLADE) ×1 IMPLANT
BLADE SURG 15 STRL SS (BLADE) ×3
BNDG COHESIVE 4X5 TAN NS LF (GAUZE/BANDAGES/DRESSINGS) ×3 IMPLANT
BNDG COHESIVE 6X5 TAN STRL LF (GAUZE/BANDAGES/DRESSINGS) IMPLANT
BNDG GAUZE ELAST 4 BULKY (GAUZE/BANDAGES/DRESSINGS) ×3 IMPLANT
COVER PERINEAL POST (MISCELLANEOUS) ×3 IMPLANT
COVER SURGICAL LIGHT HANDLE (MISCELLANEOUS) ×3 IMPLANT
DRAPE HALF SHEET 40X57 (DRAPES) IMPLANT
DRAPE STERI IOBAN 125X83 (DRAPES) ×3 IMPLANT
DRESSING ALLEVYN LIFE SACRUM (GAUZE/BANDAGES/DRESSINGS) ×2 IMPLANT
DRSG ADAPTIC 3X8 NADH LF (GAUZE/BANDAGES/DRESSINGS) ×2 IMPLANT
DRSG MEPILEX BORDER 4X4 (GAUZE/BANDAGES/DRESSINGS) ×3 IMPLANT
DRSG MEPILEX BORDER 4X8 (GAUZE/BANDAGES/DRESSINGS) ×3 IMPLANT
DRSG PAD ABDOMINAL 8X10 ST (GAUZE/BANDAGES/DRESSINGS) ×6 IMPLANT
DURAPREP 26ML APPLICATOR (WOUND CARE) ×3 IMPLANT
ELECT CAUTERY BLADE 6.4 (BLADE) ×3 IMPLANT
ELECT REM PT RETURN 9FT ADLT (ELECTROSURGICAL) ×3
ELECTRODE REM PT RTRN 9FT ADLT (ELECTROSURGICAL) ×1 IMPLANT
FACESHIELD WRAPAROUND (MASK) ×3 IMPLANT
FACESHIELD WRAPAROUND OR TEAM (MASK) ×1 IMPLANT
GAUZE SPONGE 4X4 12PLY STRL (GAUZE/BANDAGES/DRESSINGS) ×2 IMPLANT
GAUZE XEROFORM 5X9 LF (GAUZE/BANDAGES/DRESSINGS) ×3 IMPLANT
GLOVE BIO SURGEON STRL SZ7.5 (GLOVE) ×3 IMPLANT
GLOVE BIOGEL PI IND STRL 8 (GLOVE) ×1 IMPLANT
GLOVE BIOGEL PI INDICATOR 8 (GLOVE) ×2
GOWN STRL REUS W/ TWL LRG LVL3 (GOWN DISPOSABLE) ×2 IMPLANT
GOWN STRL REUS W/ TWL XL LVL3 (GOWN DISPOSABLE) ×1 IMPLANT
GOWN STRL REUS W/TWL LRG LVL3 (GOWN DISPOSABLE) ×6
GOWN STRL REUS W/TWL XL LVL3 (GOWN DISPOSABLE) ×3
GUIDEWIRE 3.2X400 (WIRE) ×4 IMPLANT
KIT BASIN OR (CUSTOM PROCEDURE TRAY) ×3 IMPLANT
KIT ROOM TURNOVER OR (KITS) ×3 IMPLANT
LINER BOOT UNIVERSAL DISP (MISCELLANEOUS) ×3 IMPLANT
MANIFOLD NEPTUNE II (INSTRUMENTS) ×3 IMPLANT
NAIL TROCH FIX 10X235 LT 130 (Nail) ×2 IMPLANT
NS IRRIG 1000ML POUR BTL (IV SOLUTION) ×3 IMPLANT
PACK GENERAL/GYN (CUSTOM PROCEDURE TRAY) ×3 IMPLANT
PAD ARMBOARD 7.5X6 YLW CONV (MISCELLANEOUS) ×6 IMPLANT
PAD CAST 4YDX4 CTTN HI CHSV (CAST SUPPLIES) ×2 IMPLANT
PADDING CAST COTTON 4X4 STRL (CAST SUPPLIES) ×6
SCREW LOCK T25 FT 36X5X4.3X (Screw) IMPLANT
SCREW LOCKING 5.0X36MM (Screw) ×3 IMPLANT
SCREW TFNA P5MM STERILE (Screw) ×2 IMPLANT
STAPLER VISISTAT 35W (STAPLE) ×3 IMPLANT
SUT MON AB 2-0 CT1 36 (SUTURE) ×3 IMPLANT
TOWEL OR 17X24 6PK STRL BLUE (TOWEL DISPOSABLE) ×3 IMPLANT
TOWEL OR 17X26 10 PK STRL BLUE (TOWEL DISPOSABLE) ×3 IMPLANT
WATER STERILE IRR 1000ML POUR (IV SOLUTION) ×3 IMPLANT

## 2017-01-06 NOTE — Brief Op Note (Signed)
01/06/2017  6:29 PM  PATIENT:  Virginia Casey  83 y.o. female  PRE-OPERATIVE DIAGNOSIS:  left hip fracture  POST-OPERATIVE DIAGNOSIS:  left hip fracture  PROCEDURE:  Procedure(s): LEFT HIP  INTERTROCHANTRIC  NAIL (Left)  SURGEON:  Surgeon(s) and Role:    * Nicholes Stairs, MD - Primary  PHYSICIAN ASSISTANT:   ASSISTANTS: none   ANESTHESIA:   general  EBL:  200 mL   BLOOD ADMINISTERED:none  DRAINS: none   LOCAL MEDICATIONS USED:  NONE  SPECIMEN:  No Specimen  DISPOSITION OF SPECIMEN:  N/A  COUNTS:  YES  TOURNIQUET:  * No tourniquets in log *  DICTATION: .Note written in EPIC  PLAN OF CARE: Admit to inpatient   PATIENT DISPOSITION:  PACU - hemodynamically stable.   Delay start of Pharmacological VTE agent (>24hrs) due to surgical blood loss or risk of bleeding: not applicable

## 2017-01-06 NOTE — Progress Notes (Signed)
PHARMACY NOTE:  ANTIMICROBIAL RENAL DOSAGE ADJUSTMENT  Current antimicrobial regimen includes a mismatch between antimicrobial dosage and estimated renal function.  As per policy approved by the Pharmacy & Therapeutics and Medical Executive Committees, the antimicrobial dosage will be adjusted accordingly.  Current antimicrobial dosage:  Ancef 1gm IV Q6H x 3 doses  Indication: Surgical prophylaxis  Renal Function:  Estimated Creatinine Clearance: 16.9 mL/min (A) (by C-G formula based on SCr of 2.07 mg/dL (H)). []      On intermittent HD, scheduled: []      On CRRT    Antimicrobial dosage has been changed to:  Ancef 1gm IV Q12H x 2 doses  Additional comments:   Shamarion Coots D. Mina Marble, PharmD, BCPS Pager:  [phone] 01/06/2017, 8:16 PM

## 2017-01-06 NOTE — Anesthesia Postprocedure Evaluation (Signed)
Anesthesia Post Note  Patient: Virginia Casey  Procedure(s) Performed: LEFT HIP  INTERTROCHANTRIC  NAIL (Left Hip)     Patient location during evaluation: PACU Anesthesia Type: General Level of consciousness: awake Pain management: pain level controlled Respiratory status: spontaneous breathing Cardiovascular status: stable Anesthetic complications: no    Last Vitals:  Vitals:   01/06/17 1935 01/06/17 1950  BP: 100/65 (!) 107/50  Pulse: 79 60  Resp: 16 15  Temp: 36.6 C 36.4 C  SpO2: 100% 100%    Last Pain:  Vitals:   01/06/17 1914  TempSrc:   PainSc: Asleep                 Joscelynn Brutus

## 2017-01-06 NOTE — Op Note (Signed)
Date of Surgery: 01/06/2017  INDICATIONS: Virginia Casey is a 83 y.o.-year-old female who sustained a left hip fracture.  She sustained a ground-level fall on Friday prior to today's surgery.  Unfortunately she is on chronic anticoagulation for atrial flutter.  We waited 72 hours for operative management to allow washout of her Eliquis.  After thorough discussion of the risks, benefits and indications of this procedure we recommended operative fixation today.  The risks and benefits of the procedure discussed with the patient prior to the procedure and all questions were answered; consent was obtained.  PREOPERATIVE DIAGNOSIS: left hip fracture   POSTOPERATIVE DIAGNOSIS: Same   PROCEDURE: Treatment of intertrochanteric, pertrochanteric, subtrochanteric fracture with intramedullary implant. CPT [phone]   SURGEON: Dannielle Karvonen. Stann Mainland, M.D.   ANESTHESIA: general   IV FLUIDS AND URINE: See anesthesia record   ESTIMATED BLOOD LOSS: 200 cc  IMPLANTS: Synthes TFN A 10 mm x 235 mm, left 95 mm compression screw 5 mm x 36 mm distal interlocking screw  DRAINS: None.   COMPLICATIONS: None.   DESCRIPTION OF PROCEDURE: The patient was brought to the operating room and placed supine on the operating table. The patient's leg had been signed prior to the procedure. The patient had the anesthesia placed by the anesthesiologist. The prep verification and incision time-outs were performed to confirm that this was the correct patient, site, side and location. The patient had an SCD on the opposite lower extremity. The patient did receive antibiotics prior to the incision and was re-dosed during the procedure as needed at indicated intervals. The patient was positioned on the fracture table with the table in traction and internal rotation to reduce the hip. The well leg was placed in a scissor position and all bony prominences were well-padded. The patient had the lower extremity prepped and draped in the standard  surgical fashion. The incision was made 4 finger breadths superior to the greater trochanter. A guide pin was inserted into the tip of the greater trochanter under fluoroscopic guidance. An opening reamer was used to gain access to the femoral canal. The nail length was measured and inserted down the femoral canal to its proper depth. The appropriate version of insertion for the lag screw was found under fluoroscopy. A pin was inserted up the femoral neck through the jig. The length of the lag screw was then measured. The lag screw was inserted as near to center-center in the head as possible. The leg was taken out of traction, then the compression screw was used to compress across the fracture. Compression was visualized on serial xrays.   We next turned our attention to the distal interlocking screw.  This was placed through the drill guide of the nail inserter.  A small incision was made overlying the lateral thigh at the screw site, and a tonsil was used to disect down to bone.  A drill pass was made through the jig and across the nail through both cortices.  This was measured, and the appropriate screw was placed under hand power and found to have good bite.    The wound was copiously irrigated with saline and the subcutaneous layer closed with 2.0 vicryl and the skin was reapproximated with staples. The wounds were cleaned and dried a final time and a sterile dressing was placed. The hip was taken through a range of motion at the end of the case under fluoroscopic imaging to visualize the approach-withdraw phenomenon and confirm implant length in the head. The patient  was then awakened from anesthesia and taken to the recovery room in stable condition. All counts were correct at the end of the case.   POSTOPERATIVE PLAN: The patient will be weight bearing as tolerated and will return in 2 weeks for staple removal and the patient will receive DVT prophylaxis based on other medications, activity level, and  risk ratio of bleeding to thrombosis.  She may begin postoperative anticoagulation as soon as medically necessary.   Virginia Casey, Virginia Casey [phone] 6:29 PM

## 2017-01-06 NOTE — Progress Notes (Signed)
PROGRESS NOTE    Virginia Casey  ZOX:[id number] DOB: [birth year] DOA: 01/03/2017 PCP: Noralee Space, MD   Brief Narrative: Virginia Casey is a 83 y.o. female with a history of CAD status post stents, atrial fibrillation/flutter, essential hypertension, chronic kidney disease, anemia, thrombocytopenia.  She presented after a fall and was found to have a left hip hip fracture.  Orthopedic surgery was consulted and is recommending surgery on Monday, November 5 secondary to chronic anticoagulation use.   Assessment & Plan:   Active Problems:   Essential hypertension   ATRIAL FLUTTER, PAROXYSMAL   Peripheral vascular disease (HCC)   Asthmatic bronchitis   Closed left hip fracture (HCC)   Hip fracture (HCC)   Left hip fracture -orthopedic surgery recommendations: surgery today -hydrocodone prn  Atrial flutter -continue metoprolool and amiodarone -holding Eliquis -heparin bridge  Essential hypertension -continue amlodipine and metoprolol  Urinary retention Transient. Voiding. +790 mL since admission -continue to watch UOP -strict in/out  Hyperlipidemia -continue Crestor  Leukocytosis Likely stress related. Afebrile. No source for infection. Stable. -trend CBC    DVT prophylaxis: SCDs, Heparin drip Code Status: Full code Family Communication: None at bedside Disposition Plan: Discharge pending orthopedic recommendations   Consultants:   Orthopedic surgery  Procedures:   None  Antimicrobials:  None    Subjective: Pain today. Just received analgesic.  Objective: Vitals:   01/05/17 1701 01/05/17 1955 01/06/17 0723 01/06/17 1031  BP: (!) 103/59 (!) 94/54 (!) 92/48 (!) 86/46  Pulse:  70 (!) 118 72  Resp:  12    Temp:  99.7 F (37.6 C) 98.6 F (37 C)   TempSrc:  Oral Oral   SpO2:  95% 95%   Weight:      Height:        Intake/Output Summary (Last 24 hours) at 01/06/2017 1052 Last data filed at 01/05/2017 1820 Gross per 24 hour  Intake 240 ml    Output -  Net 240 ml   Filed Weights   01/04/17 1300  Weight: 70.3 kg (155 lb)    Examination:  General exam: Appears calm and comfortable Respiratory system: Clear to auscultation bilaterally. Unlabored work of breathing. No wheezing or rales. Cardiovascular system: S1 & S2 heard, normal rate, irregular rhythm.  Gastrointestinal system: Abdomen is nondistended, soft and nontender. Normal bowel sounds heard. Central nervous system: Alert and oriented. No focal neurological deficits. Extremities: No edema. No calf tenderness Skin: No cyanosis. No rashes Psychiatry: Judgement and insight appear normal. Mood & affect appropriate.     Data Reviewed: I have personally reviewed following labs and imaging studies  CBC: Recent Labs  Lab 01/03/17 1646 01/04/17 0508 01/05/17 0856 01/06/17 0549  WBC 5.6 9.0 12.3* 12.5*  HGB 13.6 10.6* 10.3* 8.6*  HCT 39.5 32.6* 31.5* 25.9*  MCV 71.8* 72.1* 71.9* 71.2*  PLT 55* PLATELET CLUMPS NOTED ON SMEAR, COUNT APPEARS DECREASED 76* 73*   Basic Metabolic Panel: Recent Labs  Lab 01/03/17 1646 01/04/17 0508 01/05/17 0856  NA 141 137 136  K 3.9 4.0 4.3  CL 108 106 104  CO2 24 24 22   GLUCOSE 115* 121* 120*  BUN 27* 27* 29*  CREATININE 1.95* 1.94* 2.07*  CALCIUM 8.9 8.3* 8.8*   GFR: Estimated Creatinine Clearance: 16.9 mL/min (A) (by C-G formula based on SCr of 2.07 mg/dL (H)). Liver Function Tests: No results for input(s): AST, ALT, ALKPHOS, BILITOT, PROT, ALBUMIN in the last 168 hours. No results for input(s): LIPASE, AMYLASE in the last 168 hours.  No results for input(s): AMMONIA in the last 168 hours. Coagulation Profile: No results for input(s): INR, PROTIME in the last 168 hours. Cardiac Enzymes: No results for input(s): CKTOTAL, CKMB, CKMBINDEX, TROPONINI in the last 168 hours. BNP (last 3 results) No results for input(s): PROBNP in the last 8760 hours. HbA1C: No results for input(s): HGBA1C in the last 72 hours. CBG: No  results for input(s): GLUCAP in the last 168 hours. Lipid Profile: No results for input(s): CHOL, HDL, LDLCALC, TRIG, CHOLHDL, LDLDIRECT in the last 72 hours. Thyroid Function Tests: No results for input(s): TSH, T4TOTAL, FREET4, T3FREE, THYROIDAB in the last 72 hours. Anemia Panel: No results for input(s): VITAMINB12, FOLATE, FERRITIN, TIBC, IRON, RETICCTPCT in the last 72 hours. Sepsis Labs: No results for input(s): PROCALCITON, LATICACIDVEN in the last 168 hours.  No results found for this or any previous visit (from the past 240 hour(s)).       Radiology Studies: No results found.      Scheduled Meds: . amiodarone  200 mg Oral Daily  . amLODipine  2.5 mg Oral Daily  . feeding supplement (ENSURE ENLIVE)  237 mL Oral BID BM  . metoprolol succinate  50 mg Oral Daily  . pantoprazole  40 mg Oral BID  . rosuvastatin  20 mg Oral QHS  . Vitamin D (Ergocalciferol)  50,000 Units Oral Weekly   Continuous Infusions: . heparin 700 Units/hr (01/06/17 0703)     LOS: 3 days     Cordelia Poche, MD [facility] 01/06/2017, 10:52 AM Pager: [phone]  If 7PM-7AM, please contact night-coverage [email or web address] Password [email or web address] 01/06/2017, 10:52 AM

## 2017-01-06 NOTE — Progress Notes (Signed)
ANTICOAGULATION CONSULT NOTE - Follow Up Consult  Pharmacy Consult for heparin Indication: atrial fibrillation  Labs: Recent Labs    01/03/17 1646 01/04/17 0508 01/04/17 2252 01/05/17 0856 01/06/17 0549  HGB 13.6 10.6*  --  10.3* 8.6*  HCT 39.5 32.6*  --  31.5* 25.9*  PLT 55* PLATELET CLUMPS NOTED ON SMEAR, COUNT APPEARS DECREASED  --  76* 73*  APTT  --   --  134* 77* 61*  HEPARINUNFRC  --   --  1.68*  --  0.86*  CREATININE 1.95* 1.94*  --  2.07*  --     Assessment: 83yo female now below goal on heparin after one PTT at goal.  Goal of Therapy:  aPTT 66-102 seconds   Plan:  Will increase heparin gtt by 1-2 units/kg/hr to 700 units/hr and check PTT in [place], PharmD, BCPS  01/06/2017,6:56 AM

## 2017-01-06 NOTE — Transfer of Care (Signed)
Immediate Anesthesia Transfer of Care Note  Patient: Virginia Casey  Procedure(s) Performed: LEFT HIP  INTERTROCHANTRIC  NAIL (Left Hip)  Patient Location: PACU  Anesthesia Type:General  Level of Consciousness: awake and alert   Airway & Oxygen Therapy: Patient Spontanous Breathing and Patient connected to nasal cannula oxygen  Post-op Assessment: Report given to RN and Post -op Vital signs reviewed and stable  Post vital signs: Reviewed and stable  Last Vitals:  Vitals:   01/06/17 1031 01/06/17 1836  BP: (!) 86/46 140/88  Pulse: 72 87  Resp:  19  Temp:  36.7 C  SpO2:  98%    Last Pain:  Vitals:   01/06/17 1006  TempSrc:   PainSc: Asleep      Patients Stated Pain Goal: 0 (38/46/65 9935)  Complications: No apparent anesthesia complications

## 2017-01-06 NOTE — H&P (Signed)
H&P update  The surgical history has been reviewed and remains accurate without interval change.  The patient was re-examined and patient's physiologic condition has not changed significantly in the last 30 days. The condition still exists that makes this procedure necessary. The treatment plan remains the same, without new options for care.  No new pharmacological allergies or types of therapy has been initiated that would change the plan or the appropriateness of the plan.  The patient and/or family understand the potential benefits and risks.  Carolos Fecher P. Stann Mainland, MD 01/06/2017 4:48 PM

## 2017-01-06 NOTE — Anesthesia Preprocedure Evaluation (Addendum)
Anesthesia Evaluation  Patient identified by MRN, date of birth, ID band Patient awake    Reviewed: Allergy & Precautions, H&P , NPO status , Patient's Chart, lab work & pertinent test results, reviewed documented beta blocker date and time   Airway Mallampati: II  TM Distance: >3 FB Neck ROM: Full    Dental no notable dental hx. (+) Edentulous Upper, Edentulous Lower, Dental Advisory Given   Pulmonary asthma ,    Pulmonary exam normal breath sounds clear to auscultation       Cardiovascular hypertension, Pt. on medications and Pt. on home beta blockers + CAD, + Cardiac Stents and + Peripheral Vascular Disease   Rhythm:Irregular Rate:Normal     Neuro/Psych Anxiety negative neurological ROS  negative psych ROS   GI/Hepatic Neg liver ROS, hiatal hernia, GERD  Medicated and Controlled,  Endo/Other  negative endocrine ROS  Renal/GU negative Renal ROS  negative genitourinary   Musculoskeletal  (+) Arthritis , Osteoarthritis,    Abdominal   Peds  Hematology  (+) anemia ,   Anesthesia Other Findings   Reproductive/Obstetrics negative OB ROS                            Anesthesia Physical Anesthesia Plan  ASA: III  Anesthesia Plan: General   Post-op Pain Management:    Induction: Intravenous  PONV Risk Score and Plan: 4 or greater and Ondansetron, Dexamethasone and Treatment may vary due to age or medical condition  Airway Management Planned: Oral ETT  Additional Equipment:   Intra-op Plan:   Post-operative Plan: Extubation in OR  Informed Consent: I have reviewed the patients History and Physical, chart, labs and discussed the procedure including the risks, benefits and alternatives for the proposed anesthesia with the patient or authorized representative who has indicated his/her understanding and acceptance.   Dental advisory given  Plan Discussed with: CRNA  Anesthesia Plan  Comments:         Anesthesia Quick Evaluation

## 2017-01-06 NOTE — Progress Notes (Addendum)
ANTICOAGULATION CONSULT NOTE - Follow-Up Consult  Pharmacy Consult:  Heparin Indication: atrial fibrillation  No Known Allergies  Patient Measurements: Height: 5\' 2"  (157.5 cm)(per chart review) Weight: 155 lb (70.3 kg) IBW/kg (Calculated) : 50.1 Heparin Dosing Weight: 65 kg  Vital Signs: Temp: 98.6 F (37 C) (11/05 0723) Temp Source: Oral (11/05 0723) BP: 86/46 (11/05 1031) Pulse Rate: 72 (11/05 1031)  Labs: Recent Labs    01/03/17 1646 01/04/17 0508  01/04/17 2252 01/05/17 0856 01/06/17 0549 01/06/17 1440  HGB 13.6 10.6*  --   --  10.3* 8.6*  --   HCT 39.5 32.6*  --   --  31.5* 25.9*  --   PLT 55* PLATELET CLUMPS NOTED ON SMEAR, COUNT APPEARS DECREASED  --   --  76* 73*  --   APTT  --   --    < > 134* 77* 61* 103*  HEPARINUNFRC  --   --   --  1.68*  --  0.86*  --   CREATININE 1.95* 1.94*  --   --  2.07*  --   --    < > = values in this interval not displayed.    Estimated Creatinine Clearance: 16.9 mL/min (A) (by C-G formula based on SCr of 2.07 mg/dL (H)).  Assessment: 40 YOF with history of Afib on Eliquis PTA, last dose on 01/02/17.  Eliquis placed on hold for surgery and pharmacy consulted to manage IV heparin bridge.  Currently using aPTT to guide heparin dosing. Level yesterday was within goal range, with repeat this morning subtherapeutic. Level this afternoon came back at 103, slightly above goal range with rate increase. H/H is down from yesterday (10.3>8.6). Patient has a history of thrombocytopenia with range 74-184K; no bleeding currently. No issues with heparin infusion per nursing.   Plan to undergo orthopedic surgery today for left hip fracture.    Goal of Therapy:  Heparin level 0.3-0.7 units/ml aPTT 66 - 102 seconds Monitor platelets by anticoagulation protocol: Yes   Plan:  Decrease heparin gtt to 650 units/hr Obtain 8 hours level following rate change Daily heparin level, aPTT, CBC Monitor signs/symptoms of bleeding. Follow-up when plan to  start back home apixaban post-procedure   Doylene Canard, PharmD Clinical Pharmacist  Pager: [phone] Phone: [phone]   ADDENDUM Patient underwent treatment of fracture with intramedullary implant today (AET 1843). Discussed with Dr Stann Mainland timing of heparin infusion restart- okay with 6 hours from AET. No signs/symptoms of bleeding post-operatively.  1. Restart heparin infusion at 650 units/hr on 11/6 at 0100 2. Obtain 8 hour heparin level and aPTT  3. Follow-up when plan to restart home apixaban   Doylene Canard, PharmD Clinical Pharmacist

## 2017-01-06 NOTE — Anesthesia Procedure Notes (Signed)
Procedure Name: Intubation Date/Time: 01/06/2017 5:19 PM Performed by: Steele Stracener T, CRNA Pre-anesthesia Checklist: Patient identified, Emergency Drugs available, Suction available and Patient being monitored Patient Re-evaluated:Patient Re-evaluated prior to induction Oxygen Delivery Method: Circle system utilized Preoxygenation: Pre-oxygenation with 100% oxygen Induction Type: IV induction Ventilation: Mask ventilation without difficulty and Oral airway inserted - appropriate to patient size Laryngoscope Size: Miller and 2 Grade View: Grade I Tube type: Oral Tube size: 7.5 mm Number of attempts: 1 Airway Equipment and Method: Patient positioned with wedge pillow and Stylet Placement Confirmation: ETT inserted through vocal cords under direct vision,  positive ETCO2 and breath sounds checked- equal and bilateral Secured at: 21 cm Tube secured with: Tape Dental Injury: Teeth and Oropharynx as per pre-operative assessment

## 2017-01-07 ENCOUNTER — Encounter (HOSPITAL_COMMUNITY): Payer: Self-pay | Admitting: Orthopedic Surgery

## 2017-01-07 LAB — BASIC METABOLIC PANEL
ANION GAP: 11 (ref 5–15)
BUN: 48 mg/dL — ABNORMAL HIGH (ref 6–20)
CO2: 19 mmol/L — ABNORMAL LOW (ref 22–32)
Calcium: 8.1 mg/dL — ABNORMAL LOW (ref 8.9–10.3)
Chloride: 105 mmol/L (ref 101–111)
Creatinine, Ser: 3.81 mg/dL — ABNORMAL HIGH (ref 0.44–1.00)
GFR calc Af Amer: 11 mL/min — ABNORMAL LOW (ref >60)
GFR, EST NON AFRICAN AMERICAN: 10 mL/min — AB (ref >60)
GLUCOSE: 116 mg/dL — AB (ref 65–99)
POTASSIUM: 5 mmol/L (ref 3.5–5.1)
SODIUM: 135 mmol/L (ref 135–145)

## 2017-01-07 LAB — CBC
HCT: 25.1 % — ABNORMAL LOW (ref 36.0–46.0)
Hemoglobin: 8.2 g/dL — ABNORMAL LOW (ref 12.0–15.0)
MCH: 23.6 pg — ABNORMAL LOW (ref 26.0–34.0)
MCHC: 32.7 g/dL (ref 30.0–36.0)
MCV: 72.3 fL — AB (ref 78.0–100.0)
PLATELETS: 84 10*3/uL — AB (ref 150–400)
RBC: 3.47 MIL/uL — AB (ref 3.87–5.11)
RDW: 27.1 % — ABNORMAL HIGH (ref 11.5–15.5)
WBC: 11.1 10*3/uL — AB (ref 4.0–10.5)

## 2017-01-07 LAB — HEPARIN LEVEL (UNFRACTIONATED): HEPARIN UNFRACTIONATED: 0.74 [IU]/mL — AB (ref 0.30–0.70)

## 2017-01-07 LAB — APTT: APTT: 50 s — AB (ref 24–36)

## 2017-01-07 MED ORDER — SODIUM CHLORIDE 0.9 % IV BOLUS (SEPSIS)
500.0000 mL | Freq: Once | INTRAVENOUS | Status: AC
  Administered 2017-01-07: 500 mL via INTRAVENOUS

## 2017-01-07 MED ORDER — SODIUM CHLORIDE 0.9 % IV SOLN
INTRAVENOUS | Status: AC
  Administered 2017-01-07: 16:00:00 via INTRAVENOUS

## 2017-01-07 MED ORDER — APIXABAN 2.5 MG PO TABS
2.5000 mg | ORAL_TABLET | Freq: Two times a day (BID) | ORAL | Status: DC
  Administered 2017-01-07: 2.5 mg via ORAL
  Filled 2017-01-07: qty 1

## 2017-01-07 MED ORDER — HEPARIN (PORCINE) IN NACL 100-0.45 UNIT/ML-% IJ SOLN
400.0000 [IU]/h | INTRAMUSCULAR | Status: DC
  Administered 2017-01-07: 700 [IU]/h via INTRAVENOUS
  Administered 2017-01-09 – 2017-01-11 (×2): 550 [IU]/h via INTRAVENOUS
  Administered 2017-01-13: 400 [IU]/h via INTRAVENOUS
  Filled 2017-01-07 (×4): qty 250

## 2017-01-07 MED ORDER — POLYETHYLENE GLYCOL 3350 17 G PO PACK
17.0000 g | PACK | Freq: Every day | ORAL | Status: DC
  Administered 2017-01-07 – 2017-01-09 (×3): 17 g via ORAL
  Filled 2017-01-07 (×4): qty 1

## 2017-01-07 NOTE — Social Work (Signed)
CSW will f/u with family on SNF bed offers tomorrow as not offers received yet.  CSW will continue to follow.  Elissa Hefty, LCSW Clinical Social Worker [phone]

## 2017-01-07 NOTE — Clinical Social Work Note (Signed)
Clinical Social Work Assessment  Patient Details  Name: Virginia Casey MRN: [id number] Date of Birth: 11/30/1924  Date of referral:  01/07/17               Reason for consult:  Facility Placement                Permission sought to share information with:  Chartered certified accountant granted to share information::  Yes, Verbal Permission Granted  Name::     [name] Geneticist, molecular::  SNF  Relationship::  daughter's  Contact Information:     Housing/Transportation Living arrangements for the past 2 months:  Single Family Home Source of Information:  Adult Children Patient Interpreter Needed:  None Criminal Activity/Legal Involvement Pertinent to Current Situation/Hospitalization:  No - Comment as needed Significant Relationships:  Adult Children, Other Family Members Lives with:  Adult Children Do you feel safe going back to the place where you live?  No Need for family participation in patient care:  Yes (Comment)  Care giving concerns:  Pt from home with daughter's. Pt ambulated independently prior to hospitalization. Pt not safe to return home given new impairment.  Social Worker assessment / plan:  CSW explained the SNF process with daughter's at bedside. Pt has experienced SNF in the past and are aware of the process. Family desires [place]. CSW obtained permission to send to local SNF's for bed offers. CSW will assist with transport when medically ready. CSW answered all families questions.   Employment status:  Retired Nurse, adult PT Recommendations:  [place] / Referral to community resources:  [place]  Patient/Family's Response to care:  Family appreciative of CSW assistance with SNF options/placement. No issues or concerns identified at this time.  Patient/Family's Understanding of and Emotional Response to Diagnosis, Current Treatment, and Prognosis:  Family has good understanding of  diagnosis, current treatment and prognsosis. Family hopeful that patient will return to baseline with short term rehab. No issues or concerns identified. No issues or concerns identified.  Emotional Assessment Appearance:  Appears stated age Attitude/Demeanor/Rapport:  (Cooperative) Affect (typically observed):  Accepting, Appropriate Orientation:  Oriented to Situation, Oriented to Place, Oriented to Self Alcohol / Substance use:  Not Applicable Psych involvement (Current and /or in the community):  No (Comment)  Discharge Needs  Concerns to be addressed:  Care Coordination Readmission within the last 30 days:  No Current discharge risk:  Physical Impairment, Dependent with Mobility Barriers to Discharge:  No Barriers Identified   Normajean Baxter, LCSW 01/07/2017, 1:08 PM

## 2017-01-07 NOTE — NC FL2 (Signed)
[place] LEVEL OF CARE SCREENING TOOL     IDENTIFICATION  Patient Name: Virginia Casey Birthdate: [birth year] Sex: female Admission Date (Current Location): 01/03/2017  [facility] and [place] Number:  Herbalist and Address:  [facility], [place] [address], [place], [place] [address]      Provider Number: [id number]  Attending Physician Name and Address:  Mariel Aloe, MD  Relative Name and Phone Number:  Marolyn Haller, [phone], daughter    Current Level of Care: Hospital Recommended Level of Care: [place] Prior Approval Number:    Date Approved/Denied:   PASRR Number: [id number]  Discharge Plan: SNF    Current Diagnoses: Patient Active Problem List   Diagnosis Date Noted  . Closed left hip fracture ([place]) 01/03/2017  . Hip fracture ([place]) 01/03/2017  . Closed 2-part intertrochanteric fracture of proximal end of left femur ([place])   . Unstable angina ([place]) 04/13/2016  . Fall 05/12/2015  . IBS (irritable bowel syndrome) 07/30/2014  . Asthmatic bronchitis 07/16/2010  . Disorder of thyroid 04/18/2010  . Vitamin D deficiency 07/26/2008  . BACK PAIN, LUMBAR 07/26/2008  . ATRIAL FLUTTER, PAROXYSMAL 07/11/2008  . Diverticulosis of large intestine 07/09/2007  . COLONIC POLYPS 03/16/2007  . HYPERCHOLESTEROLEMIA 03/16/2007  . Anemia 03/16/2007  . Anxiety state 03/16/2007  . Essential hypertension 03/16/2007  . Coronary atherosclerosis 03/16/2007  . Peripheral vascular disease ([place]) 03/16/2007  . Diaphragmatic hernia 03/16/2007  . Osteoarthritis 03/16/2007    Orientation RESPIRATION BLADDER Height & Weight     Self, Situation, Place  O2(Nasal Cannula 2L) Continent, Indwelling catheter Weight: 155 lb 0 oz (70.3 kg) Height:  5' 2.01" (157.5 cm)  BEHAVIORAL SYMPTOMS/MOOD NEUROLOGICAL BOWEL NUTRITION STATUS      Continent Diet(See DC Summary)  AMBULATORY STATUS COMMUNICATION OF NEEDS Skin   Extensive  Assist Verbally Surgical wounds                       Personal Care Assistance Level of Assistance  Bathing, Feeding, Dressing Bathing Assistance: Maximum assistance Feeding assistance: Limited assistance Dressing Assistance: Maximum assistance     Functional Limitations Info  Sight, Hearing, Speech Sight Info: Adequate Hearing Info: Adequate Speech Info: Adequate    SPECIAL CARE FACTORS FREQUENCY  PT (By licensed PT), OT (By licensed OT)     PT Frequency: 3x week OT Frequency: 2x week            Contractures Contractures Info: Not present    Additional Factors Info  Code Status, Allergies Code Status Info: Full Code Allergies Info: No Known Allergies           Current Medications (01/07/2017):  This is the current hospital active medication list Current Facility-Administered Medications  Medication Dose Route Frequency Provider Last Rate Last Dose  . 0.9 %  sodium chloride infusion   Intravenous Continuous Roderic Palau, MD 10 mL/hr at 01/06/17 1920    . 0.9 %  sodium chloride infusion   Intravenous Continuous Mariel Aloe, MD 75 mL/hr at 01/07/17 1544    . acetaminophen (TYLENOL) tablet 650 mg  650 mg Oral Q4H PRN Nicholes Stairs, MD   650 mg at 01/06/17 2334   Or  . acetaminophen (TYLENOL) suppository 650 mg  650 mg Rectal Q4H PRN Nicholes Stairs, MD      . ALPRAZolam Duanne Moron) tablet 0.25 mg  0.25 mg Oral TID PRN Rise Patience, MD      .  amiodarone (PACERONE) tablet 200 mg  200 mg Oral Daily Rise Patience, MD   200 mg at 01/07/17 1038  . amLODipine (NORVASC) tablet 2.5 mg  2.5 mg Oral Daily Rise Patience, MD   2.5 mg at 01/07/17 1038  . ceFAZolin (ANCEF) IVPB 1 g/50 mL premix  1 g Intravenous Q12H Nicholes Stairs, MD 100 mL/hr at 01/07/17 0518 1 g at 01/07/17 0518  . feeding supplement (ENSURE ENLIVE) (ENSURE ENLIVE) liquid 237 mL  237 mL Oral BID BM Mariel Aloe, MD   237 mL at 01/07/17 1129  . heparin ADULT  infusion 100 units/mL (25000 units/220mL sodium chloride 0.45%)  700 Units/hr Intravenous Continuous Reginia Naas, RPH      . hydrALAZINE (APRESOLINE) injection 10 mg  10 mg Intravenous Q4H PRN Rise Patience, MD      . HYDROcodone-acetaminophen (NORCO/VICODIN) 5-325 MG per tablet 1-2 tablet  1-2 tablet Oral Q6H PRN Rise Patience, MD   1 tablet at 01/07/17 1038  . metoCLOPramide (REGLAN) tablet 5 mg  5 mg Oral Q8H PRN Nicholes Stairs, MD       Or  . metoCLOPramide [facility]) injection 5 mg  5 mg Intravenous Q8H PRN Nicholes Stairs, MD      . metoprolol succinate (TOPROL-XL) 24 hr tablet 50 mg  50 mg Oral Daily Rise Patience, MD   50 mg at 01/07/17 1038  . morphine 4 MG/ML injection 0.52 mg  0.52 mg Intravenous Q2H PRN Rise Patience, MD   0.52 mg at 01/03/17 1939  . nitroGLYCERIN (NITROSTAT) SL tablet 0.4 mg  0.4 mg Sublingual Q5 min PRN Rise Patience, MD      . ondansetron [facility]) tablet 4 mg  4 mg Oral Q6H PRN Rise Patience, MD   4 mg at 01/05/17 1709   Or  . ondansetron (ZOFRAN) injection 4 mg  4 mg Intravenous Q6H PRN Rise Patience, MD   4 mg at 01/03/17 2116  . pantoprazole (PROTONIX) EC tablet 40 mg  40 mg Oral BID Rise Patience, MD   40 mg at 01/07/17 1038  . polyethylene glycol (MIRALAX / GLYCOLAX) packet 17 g  17 g Oral Daily Mariel Aloe, MD   17 g at 01/07/17 1038  . rosuvastatin (CRESTOR) tablet 20 mg  20 mg Oral QHS Rise Patience, MD   20 mg at 01/06/17 2333  . Vitamin D (Ergocalciferol) (DRISDOL) capsule 50,000 Units  50,000 Units Oral Weekly Rise Patience, MD   50,000 Units at 01/04/17 1245     Discharge Medications: Please see discharge summary for a list of discharge medications.  Relevant Imaging Results:  Relevant Lab Results:   Additional Information SS#:[id number] [place], [place]

## 2017-01-07 NOTE — Progress Notes (Signed)
MD Made aware of low BP- New orders implemented

## 2017-01-07 NOTE — Evaluation (Signed)
Occupational Therapy Evaluation Patient Details Name: Virginia Casey MRN: [id number] DOB: 07-23-1924 Today's Date: 01/07/2017    History of Present Illness 83 y.o. female with a history of CAD status post stents, atrial fibrillation/flutter, essential hypertension, chronic kidney disease, anemia, thrombocytopenia.  She presented after a fall and was found to have a left hip hip fracture. Pt is now s/p intertrochantric nailing L hip on 11/5   Clinical Impression   This 83 y/o F presents with the above. At baseline Pt reports being independent with ADLs and functional mobility. Pt presents with increased pain, decreased activity tolerance and static sitting balance, generalized weakness. Pt requires MaxA +2 for bed mobility and MaxA-MaxA+2 for maintaining static sitting balance as Pt demonstrates strong posterior and R lateral lean. Pt requires Min-modA for seated/bed level UB ADLs; Max-total assist for LB ADLs. Pt will benefit from continued acute OT services and recommend additional OT services in SNF setting after discharge to maximize Pt's safety and independence with ADLs and functional mobility prior to discharge home.     Follow Up Recommendations  SNF;Supervision/Assistance - 24 hour    Equipment Recommendations  Other (comment)(defer to next venue )           Precautions / Restrictions Precautions Precautions: Fall Restrictions Weight Bearing Restrictions: Yes LLE Weight Bearing: Weight bearing as tolerated      Mobility Bed Mobility Overal bed mobility: Needs Assistance Bed Mobility: Supine to Sit;Sit to Supine     Supine to sit: Max assist;+2 for physical assistance;+2 for safety/equipment Sit to supine: Total assist;+2 for physical assistance;+2 for safety/equipment   General bed mobility comments: attempted having Pt scoot LEs towards EOB however Pt with increased pain when moving LEs separately; requires heavy MaxA +2 to advance bil LEs over EOB and to bring trunk  into upright position; total assist +2  for bil LE and trunk support to return to supine   Transfers Overall transfer level: Needs assistance Equipment used: 2 person hand held assist             General transfer comment: attempted sit<>stand with +2 HHA however Pt with significant difficulty maintaining position sitting EOB to attempt transfer (significant posterior lean); further mobility deferred due to safety concerns     Balance Overall balance assessment: Needs assistance Sitting-balance support: Single extremity supported;Feet supported Sitting balance-Leahy Scale: Zero Sitting balance - Comments: Pt requires Max-MaxA+2 to maintain static sitting balance; heavy lean to R and posteriorly (suspect due to pain in L hip) with significant difficulty maintaining upright position  Postural control: Posterior lean;Right lateral lean                                 ADL either performed or assessed with clinical judgement   ADL Overall ADL's : Needs assistance/impaired Eating/Feeding: Set up;Sitting   Grooming: Set up;Wash/dry face;Bed level   Upper Body Bathing: Minimal assistance;Bed level   Lower Body Bathing: Maximal assistance;+2 for physical assistance;Bed level;Sitting/lateral leans;+2 for safety/equipment   Upper Body Dressing : Maximal assistance;Sitting Upper Body Dressing Details (indicate cue type and reason): assist to don gown sitting EOB; increased assist due to poor sitting balance  Lower Body Dressing: Total assistance;+2 for physical assistance;+2 for safety/equipment;Sitting/lateral leans;Bed level Lower Body Dressing Details (indicate cue type and reason): total assist to don socks at bed level      Toileting- Clothing Manipulation and Hygiene: Total assistance;+2 for physical assistance;+2 for safety/equipment;Bed level  Functional mobility during ADLs: Maximal assistance;+2 for physical assistance;+2 for safety/equipment General ADL  Comments: Pt requires MaxA+2 for bed mobility and to maintain static sitting EOB, demonstrates significant impairments in ADL completion compared to baseline due to pain, decreased sitting balance and activity tolerance, decreased UE strength/ROM                          Pertinent Vitals/Pain Pain Assessment: Faces Faces Pain Scale: Hurts worst Pain Location: L LE with movement  Pain Descriptors / Indicators: Aching;Grimacing;Operative site guarding Pain Intervention(s): Limited activity within patient's tolerance;Monitored during session;Repositioned     Hand Dominance Right   Extremity/Trunk Assessment Upper Extremity Assessment Upper Extremity Assessment: Generalized weakness;LUE deficits/detail LUE Deficits / Details: limited AROM in LUE due to pain; pt reports also due to fall  LUE: Unable to fully assess due to pain LUE Coordination: decreased gross motor   Lower Extremity Assessment Lower Extremity Assessment: Defer to PT evaluation       Communication Communication Communication: Other (comment);HOH(HOH vs slow processing)   Cognition Arousal/Alertness: Awake/alert Behavior During Therapy: Anxious Overall Cognitive Status: Impaired/Different from baseline Area of Impairment: Following commands                       Following Commands: Follows one step commands with increased time       General Comments: slow processing at times, intermittently requires multimodal cues for following one step directions    General Comments  Pt's daughters present beginning of session; discussed option of SNF rehab after d/c with Pt's daughters in agreement                [place] expects to be discharged to:: Skilled nursing facility                                        Prior Functioning/Environment Level of Independence: Independent        Comments: Pt reports she lives alone, PTA was independent with ADLs and  mobility         OT Problem List: Decreased strength;Decreased activity tolerance;Pain;Decreased range of motion;Impaired balance (sitting and/or standing);Decreased knowledge of use of DME or AE      OT Treatment/Interventions: Self-care/ADL training;DME and/or AE instruction;Therapeutic activities;Balance training;Therapeutic exercise;Energy conservation;Patient/family education    OT Goals(Current goals can be found in the care plan section) Acute Rehab OT Goals Patient Stated Goal: move with less pain OT Goal Formulation: With patient Time For Goal Achievement: 01/21/17 Potential to Achieve Goals: Good  OT Frequency: Min 2X/week               Co-evaluation PT/OT/SLP Co-Evaluation/Treatment: Yes Reason for Co-Treatment: Complexity of the patient's impairments (multi-system involvement);For patient/therapist safety;To address functional/ADL transfers PT goals addressed during session: Mobility/safety with mobility;Balance OT goals addressed during session: ADL's and self-care      AM-PAC PT "6 Clicks" Daily Activity     Outcome Measure Help from another person eating meals?: None Help from another person taking care of personal grooming?: A Little Help from another person toileting, which includes using toliet, bedpan, or urinal?: Total Help from another person bathing (including washing, rinsing, drying)?: A Lot Help from another person to put on and taking off regular upper body clothing?: A Lot Help from another person to put on and taking off regular lower body clothing?:  Total 6 Click Score: 13   End of Session Equipment Utilized During Treatment: Gait belt Nurse Communication: Mobility status  Activity Tolerance: Patient limited by pain Patient left: in bed;with call bell/phone within reach;with bed alarm set;with nursing/sitter in room  OT Visit Diagnosis: Other abnormalities of gait and mobility (R26.89);Muscle weakness (generalized) (M62.81);History of falling  (Z91.81);Pain Pain - Right/Left: Left Pain - part of body: Hip                Time: 6553-7482 OT Time Calculation (min): 28 min Charges:  OT General Charges $OT Visit: 1 Visit OT Evaluation $OT Eval Moderate Complexity: 1 Mod G-Codes:     Lou Cal, OT Pager [phone] 01/07/2017   Raymondo Band 01/07/2017, 11:19 AM

## 2017-01-07 NOTE — Progress Notes (Addendum)
   Subjective:  Patient reports pain as mild to moderate.  Denies numbness or tingling, denies SOB/CP/N/V.  Did not get out of bed today, 2/2 pain and weakness.  Objective:   VITALS:   Vitals:   01/07/17 0535 01/07/17 1030 01/07/17 1417 01/07/17 1649  BP: 117/64 110/65 (!) 81/31 (!) 85/44  Pulse: 100  91 92  Resp: 17  15   Temp: 97.7 F (36.5 C)  97.9 F (36.6 C)   TempSrc: Axillary  Oral   SpO2: 99%  91%   Weight:      Height:        Neurovascular intact Sensation intact distally Intact pulses distally Dorsiflexion/Plantar flexion intact Incision: dressing C/D/I   Lab Results  Component Value Date   WBC 11.1 (H) 01/07/2017   HGB 8.2 (L) 01/07/2017   HCT 25.1 (L) 01/07/2017   MCV 72.3 (L) 01/07/2017   PLT 84 (L) 01/07/2017   BMET    Component Value Date/Time   NA 135 01/07/2017 0716   K 5.0 01/07/2017 0716   CL 105 01/07/2017 0716   CO2 19 (L) 01/07/2017 0716   GLUCOSE 116 (H) 01/07/2017 0716   BUN 48 (H) 01/07/2017 0716   CREATININE 3.81 (H) 01/07/2017 0716   CALCIUM 8.1 (L) 01/07/2017 0716   GFRNONAA 10 (L) 01/07/2017 0716   GFRAA 11 (L) 01/07/2017 0716     Assessment/Plan: 1 Day Post-Op   Active Problems:   Essential hypertension   ATRIAL FLUTTER, PAROXYSMAL   Peripheral vascular disease ([place])   Asthmatic bronchitis   Closed left hip fracture ([facility])   Hip fracture ([facility])   Advance diet Up with therapy WBAT LLE Resume anticoagulation, SCDs for DVT ppx   Nicholes Stairs 01/07/2017, 4:53 PM   Geralynn Rile, MD [phone]

## 2017-01-07 NOTE — Discharge Instructions (Signed)
Orthopedic postoperative instructions:  Okay for full weightbearing as tolerated on the left leg. Maintain postoperative bandage for 2 weeks until follow-up with surgeon. It is okay to shower with a postoperative bandage in place.  Information on my medicine - ELIQUIS (apixaban)  Why was Eliquis prescribed for you? Eliquis was prescribed for you to reduce the risk of a blood clot forming that can cause a stroke if you have a medical condition called atrial fibrillation (a type of irregular heartbeat).  What do You need to know about Eliquis ? Take your Eliquis TWICE DAILY - one tablet in the morning and one tablet in the evening with or without food. If you have difficulty swallowing the tablet whole please discuss with your pharmacist how to take the medication safely.  Take Eliquis exactly as prescribed by your doctor and DO NOT stop taking Eliquis without talking to the doctor who prescribed the medication.  Stopping may increase your risk of developing a stroke.  Refill your prescription before you run out.  After discharge, you should have regular check-up appointments with your healthcare provider that is prescribing your Eliquis.  In the future your dose may need to be changed if your kidney function or weight changes by a significant amount or as you get older.  What do you do if you miss a dose? If you miss a dose, take it as soon as you remember on the same day and resume taking twice daily.  Do not take more than one dose of ELIQUIS at the same time to make up a missed dose.  Important Safety Information A possible side effect of Eliquis is bleeding. You should call your healthcare provider right away if you experience any of the following: ? Bleeding from an injury or your nose that does not stop. ? Unusual colored urine (red or dark brown) or unusual colored stools (red or black). ? Unusual bruising for unknown reasons. ? A serious fall or if you hit your head (even if  there is no bleeding).  Some medicines may interact with Eliquis and might increase your risk of bleeding or clotting while on Eliquis. To help avoid this, consult your healthcare provider or pharmacist prior to using any new prescription or non-prescription medications, including herbals, vitamins, non-steroidal anti-inflammatory drugs (NSAIDs) and supplements.  This website has more information on Eliquis (apixaban): http://www.eliquis.com/eliquis/home

## 2017-01-07 NOTE — Progress Notes (Addendum)
PROGRESS NOTE    Virginia Casey  VEL:[id number] DOB: Sep 12, 1924 DOA: 01/03/2017 PCP: Noralee Space, MD   Brief Narrative: Virginia Casey is a 83 y.o. female with a history of CAD status post stents, atrial fibrillation/flutter, essential hypertension, chronic kidney disease, anemia, thrombocytopenia.  She presented after a fall and was found to have a left hip hip fracture.  Orthopedic surgery was consulted and is recommended surgery on Monday, November 5 secondary to chronic anticoagulation use. Patient is s/p intramedullary implant on 11/5. creatinine worsened. Possibly secondary to urinary retention. Working up. May need nephrology consult.   Assessment & Plan:   Active Problems:   Essential hypertension   ATRIAL FLUTTER, PAROXYSMAL   Peripheral vascular disease (HCC)   Asthmatic bronchitis   Closed left hip fracture (HCC)   Hip fracture (HCC)   Left hip fracture S/p left subtrochanteric intramedullary implant on 01/06/17 -orthopedic surgery recommendations: -hydrocodone prn  Atrial flutter -continue metoprolool and amiodarone -holding Eliquis -heparin bridge  Essential hypertension -continue amlodipine and metoprolol  Urinary retention Transient. Voiding. +1460 mL since admission -continue to watch UOP -strict in/out -bladder scan today  Acute kidney injury on CKD 3 No documented urine in/out. Patient is comfortable with minimal urine in bucket. Patient has an external catheter. Creatinine significantly worsened. -will make sure this is not secondary to obstructive uropathy. If not, renal consult and IV fludis  Hyperlipidemia -continue Crestor  Leukocytosis Likely stress related. Afebrile. No source for infection. Stable. -trend CBC  Thrombocytopenia Improving. Likely stress related. -trend CBC   DVT prophylaxis: SCDs, Heparin drip Code Status: Full code Family Communication: None at bedside Disposition Plan: Discharge pending orthopedic  recommendations   Consultants:   Orthopedic surgery  Procedures:   None  Antimicrobials:  None    Subjective: Pain improved from prior to surgery.   Objective: Vitals:   01/06/17 1935 01/06/17 1950 01/06/17 2356 01/07/17 0535  BP: 100/65 (!) 107/50 107/76 117/64  Pulse: 79 60 73 100  Resp: 16 15 15 17   Temp: 97.8 F (36.6 C) 97.6 F (36.4 C) 98.1 F (36.7 C) 97.7 F (36.5 C)  TempSrc:   Oral Axillary  SpO2: 100% 100% 95% 99%  Weight:      Height:        Intake/Output Summary (Last 24 hours) at 01/07/2017 1021 Last data filed at 01/07/2017 0845 Gross per 24 hour  Intake 1110 ml  Output 200 ml  Net 910 ml   Filed Weights   01/04/17 1300 01/06/17 1617  Weight: 70.3 kg (155 lb) 70.3 kg (155 lb 0 oz)    Examination:  General exam: Appears calm and comfortable Respiratory system: Clear to auscultation bilaterally. Unlabored work of breathing. No wheezing or rales. Cardiovascular system: S1 & S2 heard, normal rate, irregular rhythm.  Gastrointestinal system: Abdomen is nondistended, soft and nontender. Normal bowel sounds heard. Central nervous system: Alert and oriented. No focal neurological deficits. Extremities: No edema. No calf tenderness Skin: No cyanosis. No rashes Psychiatry: Judgement and insight appear normal. Mood & affect appropriate.     Data Reviewed: I have personally reviewed following labs and imaging studies  CBC: Recent Labs  Lab 01/03/17 1646 01/04/17 0508 01/05/17 0856 01/06/17 0549 01/07/17 0716  WBC 5.6 9.0 12.3* 12.5* 11.1*  HGB 13.6 10.6* 10.3* 8.6* 8.2*  HCT 39.5 32.6* 31.5* 25.9* 25.1*  MCV 71.8* 72.1* 71.9* 71.2* 72.3*  PLT 55* PLATELET CLUMPS NOTED ON SMEAR, COUNT APPEARS DECREASED 76* 73* 84*   Basic Metabolic Panel:  Recent Labs  Lab 01/03/17 1646 01/04/17 0508 01/05/17 0856 01/07/17 0716  NA 141 137 136 135  K 3.9 4.0 4.3 5.0  CL 108 106 104 105  CO2 24 24 22  19*  GLUCOSE 115* 121* 120* 116*  BUN 27* 27*  29* 48*  CREATININE 1.95* 1.94* 2.07* 3.81*  CALCIUM 8.9 8.3* 8.8* 8.1*   GFR: Estimated Creatinine Clearance: 9.2 mL/min (A) (by C-G formula based on SCr of 3.81 mg/dL (H)). Liver Function Tests: No results for input(s): AST, ALT, ALKPHOS, BILITOT, PROT, ALBUMIN in the last 168 hours. No results for input(s): LIPASE, AMYLASE in the last 168 hours. No results for input(s): AMMONIA in the last 168 hours. Coagulation Profile: No results for input(s): INR, PROTIME in the last 168 hours. Cardiac Enzymes: No results for input(s): CKTOTAL, CKMB, CKMBINDEX, TROPONINI in the last 168 hours. BNP (last 3 results) No results for input(s): PROBNP in the last 8760 hours. HbA1C: No results for input(s): HGBA1C in the last 72 hours. CBG: No results for input(s): GLUCAP in the last 168 hours. Lipid Profile: No results for input(s): CHOL, HDL, LDLCALC, TRIG, CHOLHDL, LDLDIRECT in the last 72 hours. Thyroid Function Tests: No results for input(s): TSH, T4TOTAL, FREET4, T3FREE, THYROIDAB in the last 72 hours. Anemia Panel: No results for input(s): VITAMINB12, FOLATE, FERRITIN, TIBC, IRON, RETICCTPCT in the last 72 hours. Sepsis Labs: No results for input(s): PROCALCITON, LATICACIDVEN in the last 168 hours.  Recent Results (from the past 240 hour(s))  Surgical PCR screen     Status: None   Collection Time: 01/06/17  8:10 AM  Result Value Ref Range Status   MRSA, PCR NEGATIVE NEGATIVE Final   Staphylococcus aureus NEGATIVE NEGATIVE Final    Comment: (NOTE) The Xpert SA Assay (FDA approved for NASAL specimens in patients 7 years of age and older), is one component of a comprehensive surveillance program. It is not intended to diagnose infection nor to guide or monitor treatment. Performed at [facility], [facility] [address], [place], [place] [address]          Radiology Studies: Dg C-arm 1-60 Min  Result Date: 01/06/2017 CLINICAL DATA:  83 y/o  F; intratrochanteric  nailing of left hip. EXAM: DG C-ARM 61-120 MIN; OPERATIVE LEFT HIP WITH PELVIS COMPARISON:  01/03/2017 left hip radiographs FINDINGS: Four intraoperative fluoroscopic images of left proximal femur intertrochanteric fracture nail fixation, femoral neck lag screw, and distal interlocking screw with improved angulation of proximal femur post fixation. Avulsed lesser trochanter. Fluoro time is 52 seconds. IMPRESSION: Intraoperative fluoroscopy of left intertrochanteric fracture nail fixation. Fluoro time is 52 seconds. Electronically Signed   By: Kristine Garbe M.D.   On: 01/06/2017 20:33   Dg Hip Operative Unilat W Or W/o Pelvis Left  Result Date: 01/06/2017 CLINICAL DATA:  83 y/o  F; intratrochanteric nailing of left hip. EXAM: DG C-ARM 61-120 MIN; OPERATIVE LEFT HIP WITH PELVIS COMPARISON:  01/03/2017 left hip radiographs FINDINGS: Four intraoperative fluoroscopic images of left proximal femur intertrochanteric fracture nail fixation, femoral neck lag screw, and distal interlocking screw with improved angulation of proximal femur post fixation. Avulsed lesser trochanter. Fluoro time is 52 seconds. IMPRESSION: Intraoperative fluoroscopy of left intertrochanteric fracture nail fixation. Fluoro time is 52 seconds. Electronically Signed   By: Kristine Garbe M.D.   On: 01/06/2017 20:33        Scheduled Meds: . amiodarone  200 mg Oral Daily  . amLODipine  2.5 mg Oral Daily  . feeding supplement (ENSURE ENLIVE)  237 mL Oral BID BM  . metoprolol succinate  50 mg Oral Daily  . pantoprazole  40 mg Oral BID  . polyethylene glycol  17 g Oral Daily  . rosuvastatin  20 mg Oral QHS  . Vitamin D (Ergocalciferol)  50,000 Units Oral Weekly   Continuous Infusions: . sodium chloride 10 mL/hr at 01/06/17 1920  .  ceFAZolin (ANCEF) IV 1 g (01/07/17 0518)  . heparin 650 Units/hr (01/07/17 0054)     LOS: 4 days     Cordelia Poche, MD [facility] 01/07/2017, 10:21 AM Pager: [phone]  If 7PM-7AM, please contact night-coverage [email or web address] Password [email or web address] 01/07/2017, 10:21 AM

## 2017-01-07 NOTE — Evaluation (Signed)
Physical Therapy Evaluation Patient Details Name: Virginia Casey MRN: [id number] DOB: 09/02/1924 Today's Date: 01/07/2017   History of Present Illness  83 y.o. female with a history of CAD status post stents, atrial fibrillation/flutter, essential hypertension, chronic kidney disease, anemia, thrombocytopenia.  She presented after a fall and was found to have a left hip hip fracture. Pt is now s/p intertrochantric nailing L hip on 11/5  Clinical Impression  Pt admitted with above diagnosis. Pt currently with functional limitations due to the deficits listed below (see PT Problem List). At the time of PT eval pt was able to perform transfers with +2 max to total assist for balance support and safety. Pain was the main limiting factor during session. Discussed recommendation of SNF level rehab at d/c and pt's daughters appeared agreeable. Acutely, pt will benefit from skilled PT to increase their independence and safety with mobility to allow discharge to the venue listed below.       Follow Up Recommendations SNF;Supervision/Assistance - 24 hour    Equipment Recommendations  None recommended by PT(TBD by next venue of care)    Recommendations for Other Services       Precautions / Restrictions Precautions Precautions: Fall Restrictions Weight Bearing Restrictions: Yes LLE Weight Bearing: Weight bearing as tolerated      Mobility  Bed Mobility Overal bed mobility: Needs Assistance Bed Mobility: Supine to Sit;Sit to Supine     Supine to sit: Max assist;+2 for physical assistance;+2 for safety/equipment Sit to supine: Total assist;+2 for physical assistance;+2 for safety/equipment   General bed mobility comments: attempted having Pt scoot LEs towards EOB however Pt with increased pain when moving LEs separately; requires heavy MaxA +2 to advance bil LEs over EOB and to bring trunk into upright position; total assist +2  for bil LE and trunk support to return to supine    Transfers Overall transfer level: Needs assistance Equipment used: 2 person hand held assist Transfers: Sit to/from Stand Sit to Stand: Total assist;+2 physical assistance         General transfer comment: attempted sit<>stand with +2 HHA however Pt with significant difficulty maintaining position sitting EOB to attempt transfer (significant posterior lean); further mobility deferred due to safety concerns   Ambulation/Gait                Stairs            Wheelchair Mobility    Modified Rankin (Stroke Patients Only)       Balance Overall balance assessment: Needs assistance Sitting-balance support: Single extremity supported;Feet supported Sitting balance-Leahy Scale: Zero Sitting balance - Comments: Pt requires Max-MaxA+2 to maintain static sitting balance; heavy lean to R and posteriorly (suspect due to pain in L hip) with significant difficulty maintaining upright position  Postural control: Posterior lean;Right lateral lean                                   Pertinent Vitals/Pain Pain Assessment: Faces Faces Pain Scale: Hurts worst Pain Location: L LE with movement  Pain Descriptors / Indicators: Aching;Grimacing;Operative site guarding Pain Intervention(s): Limited activity within patient's tolerance;Monitored during session;Repositioned    Home Living Family/patient expects to be discharged to:: Skilled nursing facility                      Prior Function Level of Independence: Independent         Comments: Pt  reports she lives alone, PTA was independent with ADLs and mobility      Hand Dominance   Dominant Hand: Right    Extremity/Trunk Assessment   Upper Extremity Assessment Upper Extremity Assessment: Generalized weakness LUE Deficits / Details: limited AROM in LUE due to pain; pt reports also due to fall  LUE: Unable to fully assess due to pain LUE Coordination: decreased gross motor    Lower Extremity  Assessment Lower Extremity Assessment: LLE deficits/detail LLE Deficits / Details: Decreased strength and AROM consistent with above mentioned procedure.    Cervical / Trunk Assessment Cervical / Trunk Assessment: Kyphotic  Communication   Communication: Other (comment);HOH(HOH vs slow processing)  Cognition Arousal/Alertness: Awake/alert Behavior During Therapy: Anxious Overall Cognitive Status: Impaired/Different from baseline Area of Impairment: Following commands                       Following Commands: Follows one step commands with increased time       General Comments: slow processing at times, intermittently requires multimodal cues for following one step directions       General Comments General comments (skin integrity, edema, etc.): Pt's daughters present beginning of session; discussed option of SNF rehab after d/c with Pt's daughters in agreement     Exercises     Assessment/Plan    PT Assessment Patient needs continued PT services  PT Problem List Decreased strength;Decreased range of motion;Decreased balance;Decreased activity tolerance;Decreased mobility;Decreased knowledge of use of DME;Cardiopulmonary status limiting activity;Decreased knowledge of precautions;Decreased safety awareness;Pain       PT Treatment Interventions DME instruction;Gait training;Stair training;Functional mobility training;Therapeutic activities;Therapeutic exercise;Neuromuscular re-education;Patient/family education;Cognitive remediation    PT Goals (Current goals can be found in the Care Plan section)  Acute Rehab PT Goals Patient Stated Goal: move with less pain PT Goal Formulation: With patient/family Time For Goal Achievement: 01/21/17 Potential to Achieve Goals: Fair    Frequency Min 3X/week   Barriers to discharge        Co-evaluation PT/OT/SLP Co-Evaluation/Treatment: Yes Reason for Co-Treatment: Complexity of the patient's impairments (multi-system  involvement);To address functional/ADL transfers;For patient/therapist safety PT goals addressed during session: Mobility/safety with mobility;Balance OT goals addressed during session: ADL's and self-care       AM-PAC PT "6 Clicks" Daily Activity  Outcome Measure Difficulty turning over in bed (including adjusting bedclothes, sheets and blankets)?: Unable Difficulty moving from lying on back to sitting on the side of the bed? : Unable Difficulty sitting down on and standing up from a chair with arms (e.g., wheelchair, bedside commode, etc,.)?: Unable Help needed moving to and from a bed to chair (including a wheelchair)?: Total Help needed walking in hospital room?: Total Help needed climbing 3-5 steps with a railing? : Total 6 Click Score: 6    End of Session Equipment Utilized During Treatment: Gait belt Activity Tolerance: Patient limited by pain;Patient limited by fatigue Patient left: in bed;with call bell/phone within reach;with bed alarm set;with nursing/sitter in room Nurse Communication: Mobility status PT Visit Diagnosis: Pain;Difficulty in walking, not elsewhere classified (R26.2);History of falling (Z91.81) Pain - Right/Left: Left Pain - part of body: Leg    Time: 2979-8921 PT Time Calculation (min) (ACUTE ONLY): 28 min   Charges:   PT Evaluation $PT Eval Moderate Complexity: 1 Mod     PT G Codes:        Rolinda Roan, PT, DPT Acute Rehabilitation Services Pager: [phone]   Thelma Comp 01/07/2017, 1:59 PM

## 2017-01-07 NOTE — Social Work (Signed)
CSW awaiting PT evaluations to work up for SNF placement.  CSW will continue to follow.  Elissa Hefty, LCSW Clinical Social Worker [phone]

## 2017-01-07 NOTE — Progress Notes (Addendum)
ANTICOAGULATION CONSULT NOTE - Follow-Up Consult  Pharmacy Consult:  Heparin Indication: atrial fibrillation  No Known Allergies  Patient Measurements: Height: 5' 2.01" (157.5 cm) Weight: 155 lb 0 oz (70.3 kg) IBW/kg (Calculated) : 50.12 Heparin Dosing Weight: 65 kg  Assessment: 89 YOF with history of Afib on Eliquis PTA. LD 11/1 >> held d/t fall, need hip surgery >> bridge with Heparin. APTT dropped to 50 and heparin level trending down but still fasely elevated at 0.74. Hgb 8.2, plts low at 73. APTT was borderline high on 700 units/hr yesterday. Now s/p Ortho procedure on 11/5.  Goal of Therapy:  Heparin level 0.3-0.7 units/ml aPTT 66 - 102 seconds Monitor platelets by anticoagulation protocol: Yes   Plan:  Stop heparin gtt today Start Eliquis 2.5mg  PO BID  Monitor CBC, s/s of bleed  ADDENDUM:  MD to switch back to heparin and continue holding Eliquis. Did receive 2.5mg  dose of Eliquis earlier today around 1100. APTT was low on 650 units/hr but borderline high on 700 units/hr. Will plan to restart heparin gtt at 700 units/hr tonight, 12 hrs after last Eliquis dose. Does have some bleeding charted today.  Plan: Start heparin gtt at 700 units/hr at 2300 tonight Monitor daily heparin level / aPTT, CBC, s/s of bleed   Elenor Quinones, PharmD, [facility] Clinical Pharmacist Pager [phone] 01/07/2017 10:24 AM

## 2017-01-08 ENCOUNTER — Inpatient Hospital Stay (HOSPITAL_COMMUNITY): Payer: Medicare Other

## 2017-01-08 ENCOUNTER — Encounter (HOSPITAL_COMMUNITY): Payer: Self-pay | Admitting: General Practice

## 2017-01-08 ENCOUNTER — Other Ambulatory Visit: Payer: Self-pay

## 2017-01-08 ENCOUNTER — Encounter (HOSPITAL_COMMUNITY): Payer: Medicare Other

## 2017-01-08 LAB — BASIC METABOLIC PANEL
Anion gap: 12 (ref 5–15)
BUN: 66 mg/dL — AB (ref 6–20)
CALCIUM: 7.7 mg/dL — AB (ref 8.9–10.3)
CHLORIDE: 105 mmol/L (ref 101–111)
CO2: 17 mmol/L — ABNORMAL LOW (ref 22–32)
CREATININE: 5.02 mg/dL — AB (ref 0.44–1.00)
GFR, EST AFRICAN AMERICAN: 8 mL/min — AB (ref >60)
GFR, EST NON AFRICAN AMERICAN: 7 mL/min — AB (ref >60)
Glucose, Bld: 131 mg/dL — ABNORMAL HIGH (ref 65–99)
Potassium: 5.4 mmol/L — ABNORMAL HIGH (ref 3.5–5.1)
SODIUM: 134 mmol/L — AB (ref 135–145)

## 2017-01-08 LAB — CBC
HCT: 22 % — ABNORMAL LOW (ref 36.0–46.0)
Hemoglobin: 7.3 g/dL — ABNORMAL LOW (ref 12.0–15.0)
MCH: 23.6 pg — ABNORMAL LOW (ref 26.0–34.0)
MCHC: 33.2 g/dL (ref 30.0–36.0)
MCV: 71.2 fL — ABNORMAL LOW (ref 78.0–100.0)
PLATELETS: 100 10*3/uL — AB (ref 150–400)
RBC: 3.09 MIL/uL — AB (ref 3.87–5.11)
RDW: 26.9 % — AB (ref 11.5–15.5)
WBC: 9.7 10*3/uL (ref 4.0–10.5)

## 2017-01-08 LAB — APTT: aPTT: 83 seconds — ABNORMAL HIGH (ref 24–36)

## 2017-01-08 LAB — HEPARIN LEVEL (UNFRACTIONATED): Heparin Unfractionated: >2.2 IU/mL — ABNORMAL HIGH (ref 0.30–0.70)

## 2017-01-08 MED ORDER — SODIUM CHLORIDE 0.9 % IV BOLUS (SEPSIS)
1000.0000 mL | Freq: Once | INTRAVENOUS | Status: AC
  Administered 2017-01-08: 1000 mL via INTRAVENOUS

## 2017-01-08 MED ORDER — SODIUM CHLORIDE 0.9 % IV SOLN
INTRAVENOUS | Status: DC
  Administered 2017-01-08: 12:00:00 via INTRAVENOUS

## 2017-01-08 NOTE — Progress Notes (Addendum)
Dr. Alfredia Ferguson notified of unsuccessful foley attempts x 5 RNs and bladder scanned pt for only 66mls. Susie Cassette RN

## 2017-01-08 NOTE — Progress Notes (Signed)
PROGRESS NOTE    Virginia Casey  YHC:[id number] DOB: Mar 16, 1924 DOA: 01/03/2017 PCP: Noralee Space, MD   Brief Narrative:  Virginia Casey is a 83 y.o. female with a history of CAD status post stents, atrial fibrillation/flutter, essential hypertension, chronic kidney disease, anemia, thrombocytopenia as well as other comorbids who presented after a fall and was found to have a left hip fracture. Orthopedic surgery was consulted and is recommended surgery on Monday, November 5 secondary to chronic anticoagulation use. Patient is s/p intramedullary implant on 11/5. Cr worsened so she was worked up and Nephrology and Urology were consulted. Worsening AKI on CKD likely from Hypotension so Anti-hypertensives discontinued and patient getting IVF Rehydrated.   Assessment & Plan:   Active Problems:   Essential hypertension   ATRIAL FLUTTER, PAROXYSMAL   Peripheral vascular disease (HCC)   Asthmatic bronchitis   Closed left hip fracture (HCC)   Hip fracture (HCC)  Left hip fracture s/p Intramedullary Implant  -S/p left subtrochanteric intramedullary implant on 01/06/17 -Appreciate orthopedic surgery recommendations: -hydrocodone prn  Atrial Flutter -C/w Amiodarone 200 mg po Daily and Metoprolol stopped by Nephro -Holding Eliquis for now  -C/w Heparin gtt  Essential Hypertension >> Hypotension  -Discontinued Amlodipine and Metoprolol -Discontinued Hydralazine IV prn   ? Urinary retention Transient. Voiding. +1460 mL since admission -continue to watch UOP -strict in/out -bladder scan Today showed 13 mL's -Attempted Foley Catheterization but unsuccessful and discussed with Dr. Alinda Money and will hold off for now  Acute kidney injury on CKD 3, worsening  -No documented urine in/out; Cr worsened significantly likely in the setting of Hypoperfusion from Hypotension -Nephrology consulted and stopped Anti-hypertensives -Appreciate Additional Nephrology Recommendations  -Renal U/S was  unremarkable except a Right Renal Cyst -C/w IVF and repeat CMP in AM  Hyperlipidemia -Continue Rosuvastatin 20 mg po qHS  Leukocytosis -Likely stress related due to Pain and Surgery. -Afebrile. No source for infection. Stable. -Continue to Monitor for S/Sx of Infection and repeat CBC in AM  Thrombocytopenia -Improving. Likely stress related. -Continue to trend CBC and Monitor for S/Sx of Bleeding   DVT prophylaxis: SCD's; Anticoagulated with Heparin gtt Code Status: FULL CODE Family Communication: Discussed with Daughter at bedside Disposition Plan: SNF when medically stable  Consultants:   Orthopedic Surgery  Nephrology  Urology   Procedures:  Left Hip Fracture s/p Left Hip Intertrochanteric Nail   Antimicrobials: Anti-infectives (From admission, onward)   Start     Dose/Rate Route Frequency Ordered Stop   01/07/17 0500  ceFAZolin (ANCEF) IVPB 1 g/50 mL premix     1 g 100 mL/hr over 30 Minutes Intravenous Every 12 hours 01/06/17 2007 01/07/17 1723   01/06/17 1715  ceFAZolin (ANCEF) IVPB 2g/100 mL premix     2 g 200 mL/hr over 30 Minutes Intravenous To [facility] 01/06/17 1648 01/06/17 1730   01/06/17 1649  ceFAZolin (ANCEF) 2-4 GM/100ML-% IVPB    Comments:  Grace Blight   : cabinet override      01/06/17 1649 01/06/17 1730     Subjective: Seen and examined and stated she had pressure in her bladder and felt like she needed to urinate. No CP or SOB. Pain is controlled per patient.   Objective: Vitals:   01/07/17 1417 01/07/17 1649 01/08/17 0626 01/08/17 1421  BP: (!) 81/31 (!) 85/44 (!) 80/43 100/79  Pulse: 91 92 63 (!) 58  Resp: 15  16 16   Temp: 97.9 F (36.6 C)  97.7 F (36.5 C) 97.8 F (36.6  C)  TempSrc: Oral  Oral Oral  SpO2: 91%  95% 95%  Weight:      Height:        Intake/Output Summary (Last 24 hours) at 01/08/2017 1831 Last data filed at 01/08/2017 1700 Gross per 24 hour  Intake 1107.08 ml  Output 0 ml  Net 1107.08 ml   Filed  Weights   01/04/17 1300 01/06/17 1617  Weight: 70.3 kg (155 lb) 70.3 kg (155 lb 0 oz)   Examination: Physical Exam:  Constitutional: WN/WD AAF in  NAD and appears calm and comfortable Eyes: Lids and conjunctivae normal, sclerae anicteric  ENMT: External Ears, Nose appear normal. Grossly normal hearing. Mucous membranes are dry. Neck: Appears normal, supple, no cervical masses, normal ROM, no appreciable thyromegaly, no JVD Respiratory: Diminished to auscultation bilaterally, no wheezing, rales, rhonchi or crackles. Normal respiratory effort and patient is not tachypenic. No accessory muscle use.  Cardiovascular: Irregular rhythm, no appreciable murmurs / rubs / gallops. S1 and S2 auscultated. No extremity edema.  Abdomen: Soft, non-tender, non-distended. No masses palpated. No appreciable hepatosplenomegaly. Bowel sounds positive.  GU: Deferred. Musculoskeletal: No clubbing / cyanosis of digits/nails. No joint deformity upper and lower extremities.  Skin: No rashes, lesions, ulcers on a limited skin eval. No induration; Warm and dry. Left Hip incisions appear C/D/I Neurologic: CN 2-12 grossly intact with no focal deficits. Romberg sign and  cerebellar reflexes not assessed.  Psychiatric: Normal judgment and insight. Alert and oriented x 3. Normal mood and appropriate affect.   Data Reviewed: I have personally reviewed following labs and imaging studies  CBC: Recent Labs  Lab 01/04/17 0508 01/05/17 0856 01/06/17 0549 01/07/17 0716 01/08/17 0444  WBC 9.0 12.3* 12.5* 11.1* 9.7  HGB 10.6* 10.3* 8.6* 8.2* 7.3*  HCT 32.6* 31.5* 25.9* 25.1* 22.0*  MCV 72.1* 71.9* 71.2* 72.3* 71.2*  PLT PLATELET CLUMPS NOTED ON SMEAR, COUNT APPEARS DECREASED 76* 73* 84* 381*   Basic Metabolic Panel: Recent Labs  Lab 01/03/17 1646 01/04/17 0508 01/05/17 0856 01/07/17 0716 01/08/17 0444  NA 141 137 136 135 134*  K 3.9 4.0 4.3 5.0 5.4*  CL 108 106 104 105 105  CO2 24 24 22  19* 17*  GLUCOSE 115*  121* 120* 116* 131*  BUN 27* 27* 29* 48* 66*  CREATININE 1.95* 1.94* 2.07* 3.81* 5.02*  CALCIUM 8.9 8.3* 8.8* 8.1* 7.7*   GFR: Estimated Creatinine Clearance: 7 mL/min (A) (by C-G formula based on SCr of 5.02 mg/dL (H)). Liver Function Tests: No results for input(s): AST, ALT, ALKPHOS, BILITOT, PROT, ALBUMIN in the last 168 hours. No results for input(s): LIPASE, AMYLASE in the last 168 hours. No results for input(s): AMMONIA in the last 168 hours. Coagulation Profile: No results for input(s): INR, PROTIME in the last 168 hours. Cardiac Enzymes: No results for input(s): CKTOTAL, CKMB, CKMBINDEX, TROPONINI in the last 168 hours. BNP (last 3 results) No results for input(s): PROBNP in the last 8760 hours. HbA1C: No results for input(s): HGBA1C in the last 72 hours. CBG: No results for input(s): GLUCAP in the last 168 hours. Lipid Profile: No results for input(s): CHOL, HDL, LDLCALC, TRIG, CHOLHDL, LDLDIRECT in the last 72 hours. Thyroid Function Tests: No results for input(s): TSH, T4TOTAL, FREET4, T3FREE, THYROIDAB in the last 72 hours. Anemia Panel: No results for input(s): VITAMINB12, FOLATE, FERRITIN, TIBC, IRON, RETICCTPCT in the last 72 hours. Sepsis Labs: No results for input(s): PROCALCITON, LATICACIDVEN in the last 168 hours.  Recent Results (from the past 240 hour(s))  Surgical PCR screen     Status: None   Collection Time: 01/06/17  8:10 AM  Result Value Ref Range Status   MRSA, PCR NEGATIVE NEGATIVE Final   Staphylococcus aureus NEGATIVE NEGATIVE Final    Comment: (NOTE) The Xpert SA Assay (FDA approved for NASAL specimens in patients 80 years of age and older), is one component of a comprehensive surveillance program. It is not intended to diagnose infection nor to guide or monitor treatment. Performed at [facility] ([place] [place] [address], [place], [place] [address]     Radiology Studies: US Renal  Result Date: 01/08/2017 CLINICAL DATA:   83 year old female with acute kidney injury. Hypertension. EXAM: RENAL / URINARY TRACT ULTRASOUND COMPLETE COMPARISON:  None. FINDINGS: Right Kidney: Length: 10.3 cm. Echogenicity within normal limits. No solid mass or hydronephrosis visualized. A 3.9 cm cyst within the upper right kidney identified. Left Kidney: Length: 10.4 cm. Echogenicity within normal limits. No mass or hydronephrosis visualized. Bladder: Appears normal for degree of bladder distention. IMPRESSION: Unremarkable renal ultrasound except right renal cyst. Electronically Signed   By: Margarette Canada M.D.   On: 01/08/2017 13:07   Scheduled Meds: . amiodarone  200 mg Oral Daily  . feeding supplement (ENSURE ENLIVE)  237 mL Oral BID BM  . pantoprazole  40 mg Oral BID  . polyethylene glycol  17 g Oral Daily  . rosuvastatin  20 mg Oral QHS  . Vitamin D (Ergocalciferol)  50,000 Units Oral Weekly   Continuous Infusions: . sodium chloride Stopped (01/08/17 1500)  . sodium chloride 75 mL/hr at 01/08/17 1227  . heparin 700 Units/hr (01/07/17 2310)    LOS: 5 days   Kerney Elbe, DO [facility] Pager [phone]  If 7PM-7AM, please contact night-coverage [email or web address] Password [facility] 01/08/2017, 6:31 PM

## 2017-01-08 NOTE — Social Work (Signed)
CSW provided daughter at bedside list of SNf bed offers. Daughter indicated that she needs time to review. CSW discussed with her the need to select a SNF today so that we can be prepared for the transition to a SNF.  Daughter concerned about her pt's immobility and lethargy due to medications. CSW validated and RN is addressing and is aware.  Elissa Hefty, LCSW Clinical Social Worker [phone]

## 2017-01-08 NOTE — Progress Notes (Signed)
ANTICOAGULATION CONSULT NOTE - Follow-Up Consult  Pharmacy Consult:  Heparin Indication: atrial fibrillation  No Known Allergies  Patient Measurements: Height: 5' 2.01" (157.5 cm) Weight: 155 lb 0 oz (70.3 kg) IBW/kg (Calculated) : 50.12 Heparin Dosing Weight: 65 kg  Assessment: 89 YOF with history of Afib on Eliquis PTA.  Patient was transitioned to IV heparin for surgery on 01/06/17, then transitioned back to Eliquis and received one dose before transitioning back to heparin bridge.  She has AKI.  Currently using aPTT to guide heparin dosing because Eliquis falsely elevates heparin levels.  APTT therapeutic.  No bleeding today per RN.  Goal of Therapy:  Heparin level 0.3-0.7 units/ml aPTT 66 - 102 seconds Monitor platelets by anticoagulation protocol: Yes    Plan:  Continue heparin gtt at 700 units/hr  Daily heparin level, aPTT, CBC, s/s of bleed Consider holding Norvasc and Toprol   Valery Amedee D. Mina Marble, PharmD, BCPS Pager:  [phone] 01/08/2017, 9:33 AM

## 2017-01-08 NOTE — Plan of Care (Signed)
Patient is able to rate pain on a scale of 1 to 10 and request pain medicine when needed

## 2017-01-08 NOTE — Consult Note (Signed)
[facility] CONSULT NOTE    Date: 01/08/2017                  Patient Name:  Virginia Casey  MRN: [id number]  DOB: May 18, 1924  Age / Sex:  83 y.o., female         PCP: Noralee Space, MD                 Service Requesting Consult: [facility]                 Reason for Consult: Acute on chronic kidney disease            History of Present Illness: Patient is a 84 y.o. female with a PMHx significant for CAD status post stent, CKD, hypertension, A. fib/flutter, anemia, thrombocytopenia , who was admitted to [facility] on 01/03/2017 for evaluation of left hip fracture after mechanical fall at home. Patient underwent left hip intertrochanteric nail on 11/5 (Dr. Stann Mainland) without any complications and minimal blood loss (200 mL). Since her operation, the function has worsened. On admission creatinine was 1.95 (baseline creatinine 1.3-1.4) and has gradually worsened to 5.02 (1.95>>1.94>>2.07>>3.81>>5.02) and BUN 66. Patient is on losartan, metoprolol, and amlodipine for blood pressure control at home. Since admission losartan has been held, however patient continued to be on Toprol and amlodipine. Patient has been mostly hypotensive during admission and blood pressure today is 80/43. Patient was bolused this morning (11/7) and was previously on 75 mL of normal saline. Bladder scan on 11/7 show residual volume of 13 mL. Renal ultrasound showed a 4 cm right upper pole kidney cyst and a normal left kidney and no sign of bladder distention concerning for retention. Urine output has been minimal and 150 mL in the past 24 hours. The rest of vitals are within normal limit. Nephrology team was consulted for worsening creatinine function in the setting of CKD.  Medications: Outpatient medications: Medications Prior to Admission  Medication Sig Dispense Refill Last Dose  . acetaminophen (TYLENOL) 325 MG tablet Take 325 mg by mouth every 6 (six) hours as needed. For pain   prn  . ALPRAZolam  (XANAX) 0.25 MG tablet take 1 tablet by mouth three times a day if needed 90 tablet 1 prn  . amiodarone (PACERONE) 200 MG tablet Take 200 mg by mouth daily.    01/02/2017 at Unknown time  . amLODipine (NORVASC) 2.5 MG tablet Take 2.5 mg by mouth daily.  0 01/02/2017 at Unknown time  . apixaban (ELIQUIS) 2.5 MG TABS tablet Take 2.5 mg by mouth 2 (two) times daily.   01/02/2017 at Unknown time  . losartan (COZAAR) 100 MG tablet Take 1 tablet (100 mg total) by mouth daily. 90 tablet 1 01/02/2017 at Unknown time  . metoprolol succinate (TOPROL-XL) 50 MG 24 hr tablet Take 50 mg by mouth daily. Take with or immediately following a meal.   01/02/2017 at Unknown time  . nitroGLYCERIN (NITROSTAT) 0.4 MG SL tablet Place 0.4 mg under the tongue every 5 (five) minutes as needed. May repeat x3    prn  . pantoprazole (PROTONIX) 40 MG tablet take 1 tablet by mouth twice a day 60 tablet 11 01/02/2017 at Unknown time  . rosuvastatin (CRESTOR) 20 MG tablet Take 20 mg by mouth at bedtime.    01/02/2017 at Unknown time  . Vitamin D, Ergocalciferol, (DRISDOL) 50000 units CAPS capsule take 1 capsule by mouth every week 4 capsule 11 Past Week at Unknown time  .  dicyclomine (BENTYL) 20 MG tablet Take 1/2-1 tablet by mouth up to 4 times a day as needed for abdominal cramping (Patient not taking: Reported on 01/03/2017) 30 tablet 2 Not Taking at Unknown time  . ferrous sulfate (IRON SUPPLEMENT) 325 (65 FE) MG tablet Take 1 tablet (325 mg total) by mouth daily with breakfast. (Patient not taking: Reported on 01/03/2017) 30 tablet 3 Not Taking at Unknown time    Current medications: Current Facility-Administered Medications  Medication Dose Route Frequency Provider Last Rate Last Dose  . 0.9 %  sodium chloride infusion   Intravenous Continuous Roderic Palau, MD 10 mL/hr at 01/06/17 1920    . 0.9 %  sodium chloride infusion   Intravenous Continuous Raiford Noble [place], DO 75 mL/hr at 01/08/17 1227    . acetaminophen (TYLENOL)  tablet 650 mg  650 mg Oral Q4H PRN Nicholes Stairs, MD   650 mg at 01/07/17 2306   Or  . acetaminophen (TYLENOL) suppository 650 mg  650 mg Rectal Q4H PRN Nicholes Stairs, MD      . ALPRAZolam Duanne Moron) tablet 0.25 mg  0.25 mg Oral TID PRN Rise Patience, MD      . amiodarone (PACERONE) tablet 200 mg  200 mg Oral Daily Rise Patience, MD   200 mg at 01/08/17 0835  . amLODipine (NORVASC) tablet 2.5 mg  2.5 mg Oral Daily Rise Patience, MD   2.5 mg at 01/08/17 0835  . feeding supplement (ENSURE ENLIVE) (ENSURE ENLIVE) liquid 237 mL  237 mL Oral BID BM Mariel Aloe, MD   237 mL at 01/08/17 0835  . heparin ADULT infusion 100 units/mL (25000 units/262mL sodium chloride 0.45%)  700 Units/hr Intravenous Continuous Reginia Naas, RPH 7 mL/hr at 01/07/17 2310 700 Units/hr at 01/07/17 2310  . hydrALAZINE (APRESOLINE) injection 10 mg  10 mg Intravenous Q4H PRN Rise Patience, MD      . HYDROcodone-acetaminophen (NORCO/VICODIN) 5-325 MG per tablet 1-2 tablet  1-2 tablet Oral Q6H PRN Rise Patience, MD   1 tablet at 01/07/17 1038  . metoCLOPramide (REGLAN) tablet 5 mg  5 mg Oral Q8H PRN Nicholes Stairs, MD       Or  . metoCLOPramide [facility]) injection 5 mg  5 mg Intravenous Q8H PRN Nicholes Stairs, MD      . metoprolol succinate (TOPROL-XL) 24 hr tablet 50 mg  50 mg Oral Daily Rise Patience, MD   50 mg at 01/08/17 0834  . morphine 4 MG/ML injection 0.52 mg  0.52 mg Intravenous Q2H PRN Rise Patience, MD   0.52 mg at 01/03/17 1939  . nitroGLYCERIN (NITROSTAT) SL tablet 0.4 mg  0.4 mg Sublingual Q5 min PRN Rise Patience, MD      . ondansetron [facility]) tablet 4 mg  4 mg Oral Q6H PRN Rise Patience, MD   4 mg at 01/05/17 1709   Or  . ondansetron (ZOFRAN) injection 4 mg  4 mg Intravenous Q6H PRN Rise Patience, MD   4 mg at 01/03/17 2116  . pantoprazole (PROTONIX) EC tablet 40 mg  40 mg Oral BID Rise Patience, MD    40 mg at 01/08/17 0834  . polyethylene glycol (MIRALAX / GLYCOLAX) packet 17 g  17 g Oral Daily Mariel Aloe, MD   17 g at 01/08/17 0834  . rosuvastatin (CRESTOR) tablet 20 mg  20 mg Oral QHS Rise Patience, MD   20 mg at 01/07/17  2306  . Vitamin D (Ergocalciferol) (DRISDOL) capsule 50,000 Units  50,000 Units Oral Weekly Rise Patience, MD   50,000 Units at 01/04/17 1245      Allergies: No Known Allergies    Past Medical History: Past Medical History:  Diagnosis Date  . Anemia   . Anxiety   . Atherosclerotic heart disease   . Atrial flutter, paroxysmal ([place])   . Colon polyp   . Diverticulosis of colon   . DJD (degenerative joint disease)    "legs" (10/06/2012)  . Exertional shortness of breath   . GERD (gastroesophageal reflux disease)   . Hiatal hernia   . History of blood transfusion ? 2010  . HTN (hypertension)   . Hypercholesterolemia   . Myocardial infarction ([place]) 2010  . Pneumonia    "once; years ago" (10/06/2012)  . PVD (peripheral vascular disease) ([place])   . Vitamin D deficiency      Past Surgical History: Past Surgical History:  Procedure Laterality Date  . CORONARY ANGIOPLASTY  10/06/2012  . CORONARY ANGIOPLASTY WITH STENT PLACEMENT  04/2008   "1; Dr Terrence Dupont" (10/06/2012)  . DILATION AND CURETTAGE OF UTERUS    . TOTAL HIP ARTHROPLASTY Right 12/09   Dr Marlou Sa     Family History: Family History  Problem Relation Age of Onset  . Heart disease Mother   . Rheum arthritis Mother   . Heart disease Maternal Grandmother   . Heart disease Maternal Grandfather   . Rheum arthritis Father   . Lung cancer Father   . Stroke Maternal Aunt   . Colon cancer Neg Hx   . Stomach cancer Neg Hx   . Rectal cancer Neg Hx   . Esophageal cancer Neg Hx   . Liver cancer Neg Hx      Social History: Social History   Socioeconomic History  . Marital status: Widowed    Spouse name: Not on file  . Number of children: 3  . Years of education: Not on file  .  Highest education level: Not on file  Social Needs  . Financial resource strain: Not on file  . Food insecurity - worry: Not on file  . Food insecurity - inability: Not on file  . Transportation needs - medical: Not on file  . Transportation needs - non-medical: Not on file  Occupational History  . Occupation: retired  Tobacco Use  . Smoking status: Never Smoker  . Smokeless tobacco: Never Used  Substance and Sexual Activity  . Alcohol use: No    Alcohol/week: 0.0 oz  . Drug use: No  . Sexual activity: No    Birth control/protection: Post-menopausal  Other Topics Concern  . Not on file  Social History Narrative   Lives with granddaughter   3 children, 1 has passed   No caffeine           Review of Systems: As per HPI  Vital Signs: Blood pressure (!) 80/43, pulse 63, temperature 97.7 F (36.5 C), temperature source Oral, resp. rate 16, height 5' 2.01" (1.575 m), weight 155 lb 0 oz (70.3 kg), SpO2 95 %.  Weight trends: Filed Weights   01/04/17 1300 01/06/17 1617  Weight: 155 lb (70.3 kg) 155 lb 0 oz (70.3 kg)    Physical Exam: General: Vital signs reviewed and noted. Well-developed, well-nourished, in no acute distress; alert, appropriate and cooperative throughout examination.  Head: Normocephalic, atraumatic.  Eyes: PERRL, EOMI, No signs of anemia or jaundince.  Nose: Mucous membranes moist, not inflammed,  nonerythematous.  Throat: Oropharynx nonerythematous, no exudate appreciated.   Neck: No deformities, masses, or tenderness noted.Supple, No carotid Bruits, no JVD.  Lungs:  Normal respiratory effort. Clear to auscultation BL without crackles or wheezes.  Heart: RRR. S1 and S2 normal without gallop, murmur, or rubs.  Abdomen:  BS normoactive. Soft, Nondistended, non-tender.  No masses or organomegaly.  Extremities: No pretibial edema.  Neurologic: A&O X3, CN II - XII are grossly intact. Motor strength is 5/5 in the all 4 extremities, Sensations intact to light  touch, Cerebellar signs negative.  Skin: No visible rashes, scars.    Lab results: Basic Metabolic Panel: Recent Labs  Lab 01/05/17 0856 01/07/17 0716 01/08/17 0444  NA 136 135 134*  K 4.3 5.0 5.4*  CL 104 105 105  CO2 22 19* 17*  GLUCOSE 120* 116* 131*  BUN 29* 48* 66*  CREATININE 2.07* 3.81* 5.02*  CALCIUM 8.8* 8.1* 7.7*    Liver Function Tests: No results for input(s): AST, ALT, ALKPHOS, BILITOT, PROT, ALBUMIN in the last 168 hours. No results for input(s): LIPASE, AMYLASE in the last 168 hours. No results for input(s): AMMONIA in the last 168 hours.  CBC: Recent Labs  Lab 01/04/17 0508 01/05/17 0856 01/06/17 0549 01/07/17 0716 01/08/17 0444  WBC 9.0 12.3* 12.5* 11.1* 9.7  HGB 10.6* 10.3* 8.6* 8.2* 7.3*  HCT 32.6* 31.5* 25.9* 25.1* 22.0*  MCV 72.1* 71.9* 71.2* 72.3* 71.2*  PLT PLATELET CLUMPS NOTED ON SMEAR, COUNT APPEARS DECREASED 76* 73* 84* 100*    Cardiac Enzymes: No results for input(s): CKTOTAL, CKMB, CKMBINDEX, TROPONINI in the last 168 hours.  BNP: Invalid input(s): POCBNP  CBG: No results for input(s): GLUCAP in the last 168 hours.  Microbiology: Results for orders placed or performed during the hospital encounter of 01/03/17  Surgical PCR screen     Status: None   Collection Time: 01/06/17  8:10 AM  Result Value Ref Range Status   MRSA, PCR NEGATIVE NEGATIVE Final   Staphylococcus aureus NEGATIVE NEGATIVE Final    Comment: (NOTE) The Xpert SA Assay (FDA approved for NASAL specimens in patients 31 years of age and older), is one component of a comprehensive surveillance program. It is not intended to diagnose infection nor to guide or monitor treatment. Performed at [facility], [place] [address] [place], [place] [address]    *Note: Due to a large number of results and/or encounters for the requested time period, some results have not been displayed. A complete set of results can be found in Results Review.     Coagulation Studies: No results for input(s): LABPROT, INR in the last 72 hours.  Urinalysis: No results for input(s): COLORURINE, LABSPEC, PHURINE, GLUCOSEU, HGBUR, BILIRUBINUR, KETONESUR, PROTEINUR, UROBILINOGEN, NITRITE, LEUKOCYTESUR in the last 72 hours.  Invalid input(s): APPERANCEUR    Imaging: US Renal  Result Date: 01/08/2017 CLINICAL DATA:  83 year old female with acute kidney injury. Hypertension. EXAM: RENAL / URINARY TRACT ULTRASOUND COMPLETE COMPARISON:  None. FINDINGS: Right Kidney: Length: 10.3 cm. Echogenicity within normal limits. No solid mass or hydronephrosis visualized. A 3.9 cm cyst within the upper right kidney identified. Left Kidney: Length: 10.4 cm. Echogenicity within normal limits. No mass or hydronephrosis visualized. Bladder: Appears normal for degree of bladder distention. IMPRESSION: Unremarkable renal ultrasound except right renal cyst. Electronically Signed   By: Margarette Canada M.D.   On: 01/08/2017 13:07   Dg C-arm 1-60 Min  Result Date: 01/06/2017 CLINICAL DATA:  83 y/o  F; intratrochanteric nailing of  left hip. EXAM: DG C-ARM 61-120 MIN; OPERATIVE LEFT HIP WITH PELVIS COMPARISON:  01/03/2017 left hip radiographs FINDINGS: Four intraoperative fluoroscopic images of left proximal femur intertrochanteric fracture nail fixation, femoral neck lag screw, and distal interlocking screw with improved angulation of proximal femur post fixation. Avulsed lesser trochanter. Fluoro time is 52 seconds. IMPRESSION: Intraoperative fluoroscopy of left intertrochanteric fracture nail fixation. Fluoro time is 52 seconds. Electronically Signed   By: Kristine Garbe M.D.   On: 01/06/2017 20:33   Dg Hip Operative Unilat W Or W/o Pelvis Left  Result Date: 01/06/2017 CLINICAL DATA:  83 y/o  F; intratrochanteric nailing of left hip. EXAM: DG C-ARM 61-120 MIN; OPERATIVE LEFT HIP WITH PELVIS COMPARISON:  01/03/2017 left hip radiographs FINDINGS: Four intraoperative  fluoroscopic images of left proximal femur intertrochanteric fracture nail fixation, femoral neck lag screw, and distal interlocking screw with improved angulation of proximal femur post fixation. Avulsed lesser trochanter. Fluoro time is 52 seconds. IMPRESSION: Intraoperative fluoroscopy of left intertrochanteric fracture nail fixation. Fluoro time is 52 seconds. Electronically Signed   By: Kristine Garbe M.D.   On: 01/06/2017 20:33      Assessment & Plan: Pt is a 83 y.o. yo female with a PMHX significant for CAD status post stent, CKD, hypertension, A. fib/flutter, anemia, thrombocytopenia  , was admitted to [facility] on 01/03/2017 after mechanical fall at home leading to left hip fracture requiring orthopedic surgery. Since admission, kidney function has severely worsened.  #Acute on chronic kidney disease, worsening Patient initially admitted for left hip fracture requiring repair. On admission, creatinine was 1.94 elevated from baseline of 1.31 (04/2016). Initially during his admission (11/2) creatinine was stable, but has gradually worsened since 11/5. Today creatinine is 5.02 with a BUN of 66. Losartan was held on admission. Patient continued to be on metoprolol 50 mg daily which was started back at the end of June and amlodipine 2.5 mg daily (recently reduced, used to be on 5 mg daily). She has been mostly hypotensive during this admission with a blood pressure of 80/43 this morning. She was also hypotensive prior to surgery. Minimal blood loss was noted after surgery. She reports weakness and dizziness prior to this admission after starting new blood pressure medication 2-3 months ago which likely led to fall. Looking at her record, it looks like since starting metoprolol in combination with amlodipine and losartan, blood pressure has been dropping causing weakness, dizziness and fatigue. Upon further evaluation of her kidney function worsening creatinine coincides with initiation of metoprolol  which likely caused sustained hypotension leading to hypoperfusion of her kidneys. It appears that patient could benefit from optimization of her blood pressure medication. Kidney function has gradually worsened in the past few months. Will recommend holding all blood pressure meds. Continue IV fluid. Patient can continue taking amiodarone for atrial fibrillation. Expect improvement of kidney function with higher pressures. There is no signs of urinary retention. Bladder scan was normal and renal ultrasound was essentially unremarkable. --Discontinue amlodipine and metoprolol --Continue normal saline 75-100 mL/h --Monitor volume status and blood pressure --Follow-up on a.m. kidney function labs  #Left hip fracture status post repair Managed by orthopedic surgery weight bearing as tolerated with PT and OT. --Continue pain control when necessary  #Atrial fibrillation, controlled Patient is currently on heparin bridge and was given 1 dose of Eliquis status post surgery. Continue management per pharmacy. Will eventually transition to Eliquis.  #Hypertension Patient has mostly hypotensive during admission with a blood pressure of 80/43 today. Patient is on  losartan 100 mg daily, metoprolol 50 mg daily, amlodipine 2.5 mg daily. Current regimen have led to multiple hypotensive episodes likely causing fall, weakness, dizziness reported by patient at home for the past few months. Will hold all blood pressure medication while inpatient. Patient will have to follow up with cardiologist for optimization of her current regimen. Losartan was held on admission given worsening creatinine. --Discontinue metoprolol and amlodipine  #Urinary retention Bladder scan showed 13 mL. Renal ultrasound was gated for distended bladder or hydronephrosis. Foley was attempted was not placed due to anatomy. Low urine output likely secondary to worsening creatinine in the setting of hypoperfusion. Will continue to monitor.  DVT  PPX -per primary team, currently on heparin

## 2017-01-08 NOTE — Progress Notes (Signed)
PT Cancellation Note  Patient Details Name: Virginia Casey MRN: [id number] DOB: 1924-05-13   Cancelled Treatment:    Reason Eval/Treat Not Completed: Patient at procedure or test/unavailable Pt being transported for renal US. Will follow up as schedule allows.   Virginia Casey, PT, DPT  Acute Rehabilitation Services  Pager: [phone]    Virginia Casey 01/08/2017, 12:30 PM

## 2017-01-08 NOTE — Progress Notes (Signed)
Physical Therapy Treatment Patient Details Name: Virginia Casey MRN: [id number] DOB: 04/05/1924 Today's Date: 01/08/2017    History of Present Illness 83 y.o. female with a history of CAD status post stents, atrial fibrillation/flutter, essential hypertension, chronic kidney disease, anemia, thrombocytopenia.  She presented after a fall and was found to have a left hip hip fracture. Pt is now s/p intertrochantric nailing L hip on 11/5    PT Comments    Pt with slow progression towards goals. Continues to be very fearful to put weight on LLE secondary to pain. Attempted to stand with 2 person HHA, however, pt unable to stand fully upright and required total A +2. Used stedy and pt required max A +2 to stand, however, pt continued to be very guarded and fearful. Used stedy to transfer pt to chair this session, as was unsafe to attempt stand pivot. Pt reporting some dizziness during session and BP slightly low when entering, however, increased to WNL upon sitting at EOB. NT present throughout to collect vitals. Current recommendations remain appropriate. Will continue to follow acutely to maximize functional mobility independence and safety.    Follow Up Recommendations  SNF;Supervision/Assistance - 24 hour     Equipment Recommendations  None recommended by PT    Recommendations for Other Services       Precautions / Restrictions Precautions Precautions: Fall Restrictions Weight Bearing Restrictions: Yes LLE Weight Bearing: Weight bearing as tolerated    Mobility  Bed Mobility Overal bed mobility: Needs Assistance Bed Mobility: Supine to Sit     Supine to sit: Max assist;+2 for physical assistance;+2 for safety/equipment     General bed mobility comments: Heavy max A +2 to sit at EOB. REquired assist for trunk elevation and for scooting hips to EOB. Pt leaning to R to offweight L hip. Required assist and cues for midline orientation.   Transfers Overall transfer level: Needs  assistance Equipment used: 2 person hand held assist Transfers: Sit to/from Stand Sit to Stand: Total assist;Max assist;+2 physical assistance         General transfer comment: Attempted sit<>Stand X 2 with 2 person HHA, however, pt very fearful and required total assist +2 to perform partial stand. Brought stedy in the room and required heavy max A +2 to stand. Unable to stand fully upright, however, did stand enough to place seat flaps under pt. Pt very guarded throughout. Used stedy to transfer to chair and required max A +2 to stand from stedy to sit into chair.   Ambulation/Gait             General Gait Details: Not safe to attempt as pt unable to stand.    Stairs            Wheelchair Mobility    Modified Rankin (Stroke Patients Only)       Balance Overall balance assessment: Needs assistance Sitting-balance support: Bilateral upper extremity supported;Feet supported Sitting balance-Leahy Scale: Poor Sitting balance - Comments: Reliant on BUE and at least min guard to maintain balance. Required up to mod A to maintain balance secondary to pain.    Standing balance support: Bilateral upper extremity supported;During functional activity Standing balance-Leahy Scale: Zero Standing balance comment: Reliant on external support. Unable to stand fully upright.                             Cognition Arousal/Alertness: Awake/alert Behavior During Therapy: Anxious Overall Cognitive Status: Impaired/Different from baseline Area  of Impairment: Following commands                       Following Commands: Follows one step commands with increased time       General Comments: Very anxious with movement of LLE. Required continuous cues that it was ok to move LLE.       Exercises      General Comments General comments (skin integrity, edema, etc.): Pt not willing to perform ther ex this session secondary to pain in LLE. Educated about importance  of movement, however, pt continues to refuse.       Pertinent Vitals/Pain Pain Assessment: Faces Faces Pain Scale: Hurts worst Pain Location: L LE with movement  Pain Descriptors / Indicators: Aching;Grimacing;Operative site guarding Pain Intervention(s): Limited activity within patient's tolerance;Monitored during session;Repositioned    Home Living                      Prior Function            PT Goals (current goals can now be found in the care plan section) Acute Rehab PT Goals PT Goal Formulation: With patient/family Time For Goal Achievement: 01/21/17 Potential to Achieve Goals: Fair Progress towards PT goals: Progressing toward goals    Frequency    Min 3X/week      PT Plan Current plan remains appropriate    Co-evaluation              AM-PAC PT "6 Clicks" Daily Activity  Outcome Measure  Difficulty turning over in bed (including adjusting bedclothes, sheets and blankets)?: Unable Difficulty moving from lying on back to sitting on the side of the bed? : Unable Difficulty sitting down on and standing up from a chair with arms (e.g., wheelchair, bedside commode, etc,.)?: Unable Help needed moving to and from a bed to chair (including a wheelchair)?: Total Help needed walking in hospital room?: Total Help needed climbing 3-5 steps with a railing? : Total 6 Click Score: 6    End of Session Equipment Utilized During Treatment: Gait belt Activity Tolerance: Patient limited by pain;Patient limited by fatigue Patient left: in chair;with call bell/phone within reach;with chair alarm set Nurse Communication: Mobility status PT Visit Diagnosis: Pain;Difficulty in walking, not elsewhere classified (R26.2);History of falling (Z91.81) Pain - Right/Left: Left Pain - part of body: Leg     Time: 7017-7939 PT Time Calculation (min) (ACUTE ONLY): 33 min  Charges:  $Therapeutic Activity: 23-37 mins                    G Codes:       Leighton Ruff, PT, DPT  Acute Rehabilitation Services  Pager: [phone]    Rudean Hitt 01/08/2017, 3:44 PM

## 2017-01-08 NOTE — Consult Note (Signed)
Urology Consult   Physician requesting consult: Dr. Alfredia Ferguson  Reason for consult: Difficult urethral catheterization  History of Present Illness: Virginia Casey is a 83 y.o. who suffered a recent left hip fracture s/p repair.  She has developed acute renal failure (baseline Cr is 1.9) over the last 24-48 hours.  Bladder scan indicated 13 cc in her bladder. Attempts at catheterization have been unsuccessful by nursing staff multiple times.  Requested to place catheter per nephrology. She denies a history of difficulty voiding.   Past Medical History:  Diagnosis Date  . Anemia   . Anxiety   . Atherosclerotic heart disease   . Atrial flutter, paroxysmal ([place])   . Colon polyp   . Diverticulosis of colon   . DJD (degenerative joint disease)    "legs" (10/06/2012)  . Exertional shortness of breath   . GERD (gastroesophageal reflux disease)   . Hiatal hernia   . History of blood transfusion ? 2010  . HTN (hypertension)   . Hypercholesterolemia   . Myocardial infarction ([place]) 2010  . Pneumonia    "once; years ago" (10/06/2012)  . PVD (peripheral vascular disease) ([place])   . Vitamin D deficiency     Past Surgical History:  Procedure Laterality Date  . CORONARY ANGIOPLASTY  10/06/2012  . CORONARY ANGIOPLASTY WITH STENT PLACEMENT  04/2008   "1; Dr Terrence Dupont" (10/06/2012)  . DILATION AND CURETTAGE OF UTERUS    . FRACTURE SURGERY    . HERNIA REPAIR    . TOTAL HIP ARTHROPLASTY Right 12/09   Dr Marlou Sa     Current [facility] Medications:  Home meds:  Current Meds  Medication Sig  . acetaminophen (TYLENOL) 325 MG tablet Take 325 mg by mouth every 6 (six) hours as needed. For pain  . ALPRAZolam (XANAX) 0.25 MG tablet take 1 tablet by mouth three times a day if needed  . amiodarone (PACERONE) 200 MG tablet Take 200 mg by mouth daily.   [name] amLODipine (NORVASC) 2.5 MG tablet Take 2.5 mg by mouth daily.  [name] apixaban (ELIQUIS) 2.5 MG TABS tablet Take 2.5 mg by mouth 2 (two) times daily.  [name] losartan  (COZAAR) 100 MG tablet Take 1 tablet (100 mg total) by mouth daily.  . metoprolol succinate (TOPROL-XL) 50 MG 24 hr tablet Take 50 mg by mouth daily. Take with or immediately following a meal.  . nitroGLYCERIN (NITROSTAT) 0.4 MG SL tablet Place 0.4 mg under the tongue every 5 (five) minutes as needed. May repeat x3   . pantoprazole (PROTONIX) 40 MG tablet take 1 tablet by mouth twice a day  . rosuvastatin (CRESTOR) 20 MG tablet Take 20 mg by mouth at bedtime.   . Vitamin D, Ergocalciferol, (DRISDOL) 50000 units CAPS capsule take 1 capsule by mouth every week    Scheduled Meds: . amiodarone  200 mg Oral Daily  . feeding supplement (ENSURE ENLIVE)  237 mL Oral BID BM  . pantoprazole  40 mg Oral BID  . polyethylene glycol  17 g Oral Daily  . rosuvastatin  20 mg Oral QHS  . Vitamin D (Ergocalciferol)  50,000 Units Oral Weekly   Continuous Infusions: . sodium chloride Stopped (01/08/17 1500)  . sodium chloride 75 mL/hr at 01/08/17 1227  . heparin 700 Units/hr (01/07/17 2310)   PRN Meds:.acetaminophen **OR** acetaminophen, ALPRAZolam, hydrALAZINE, HYDROcodone-acetaminophen, metoCLOPramide **OR** metoCLOPramide (REGLAN) injection, morphine injection, nitroGLYCERIN, ondansetron **OR** ondansetron (ZOFRAN) IV  Allergies: No Known Allergies  Family History  Problem Relation Age of Onset  . Heart  disease Mother   . Rheum arthritis Mother   . Heart disease Maternal Grandmother   . Heart disease Maternal Grandfather   . Rheum arthritis Father   . Lung cancer Father   . Stroke Maternal Aunt   . Colon cancer Neg Hx   . Stomach cancer Neg Hx   . Rectal cancer Neg Hx   . Esophageal cancer Neg Hx   . Liver cancer Neg Hx     Social History:  reports that  has never smoked. she has never used smokeless tobacco. She reports that she does not drink alcohol or use drugs.  ROS: A complete review of systems was performed.  All systems are negative except for pertinent findings as  noted.  Physical Exam:  Vital signs in last 24 hours: Temp:  [97.7 F (36.5 C)-97.8 F (36.6 C)] 97.8 F (36.6 C) (11/07 1421) Pulse Rate:  [58-92] 58 (11/07 1421) Resp:  [16] 16 (11/07 1421) BP: (80-100)/(43-79) 100/79 (11/07 1421) SpO2:  [95 %] 95 % (11/07 1421) Constitutional:  Alert and oriented, No acute distress Cardiovascular: No JVD Respiratory: Normal respiratory effort GI: Abdomen is soft, nontender, nondistended. GU: No CVA tenderness.  She does have a very small introitus with a retracted urethra.  I can palpate her urethra but cannot visualize it. Neurologic: Grossly intact, no focal deficits Psychiatric: Normal mood and affect  Laboratory Data:  Recent Labs    01/06/17 0549 01/07/17 0716 01/08/17 0444  WBC 12.5* 11.1* 9.7  HGB 8.6* 8.2* 7.3*  HCT 25.9* 25.1* 22.0*  PLT 73* 84* 100*    Recent Labs    01/07/17 0716 01/08/17 0444  NA 135 134*  K 5.0 5.4*  CL 105 105  GLUCOSE 116* 131*  BUN 48* 66*  CALCIUM 8.1* 7.7*  CREATININE 3.81* 5.02*     Results for orders placed or performed during the hospital encounter of 01/03/17 (from the past 24 hour(s))  Basic metabolic panel     Status: Abnormal   Collection Time: 01/08/17  4:44 AM  Result Value Ref Range   Sodium 134 (L) 135 - 145 mmol/L   Potassium 5.4 (H) 3.5 - 5.1 mmol/L   Chloride 105 101 - 111 mmol/L   CO2 17 (L) 22 - 32 mmol/L   Glucose, Bld 131 (H) 65 - 99 mg/dL   BUN 66 (H) 6 - 20 mg/dL   Creatinine, Ser 5.02 (H) 0.44 - 1.00 mg/dL   Calcium 7.7 (L) 8.9 - 10.3 mg/dL   GFR calc non Af Amer 7 (L) >60 mL/min   GFR calc Af Amer 8 (L) >60 mL/min   Anion gap 12 5 - 15  CBC     Status: Abnormal   Collection Time: 01/08/17  4:44 AM  Result Value Ref Range   WBC 9.7 4.0 - 10.5 K/uL   RBC 3.09 (L) 3.87 - 5.11 MIL/uL   Hemoglobin 7.3 (L) 12.0 - 15.0 g/dL   HCT 22.0 (L) 36.0 - 46.0 %   MCV 71.2 (L) 78.0 - 100.0 fL   MCH 23.6 (L) 26.0 - 34.0 pg   MCHC 33.2 30.0 - 36.0 g/dL   RDW 26.9 (H) 11.5  - 15.5 %   Platelets 100 (L) 150 - 400 K/uL  APTT     Status: Abnormal   Collection Time: 01/08/17  6:54 AM  Result Value Ref Range   aPTT 83 (H) 24 - 36 seconds  Heparin level (unfractionated)     Status: Abnormal   Collection  Time: 01/08/17  6:54 AM  Result Value Ref Range   Heparin Unfractionated >2.20 (H) 0.30 - 0.70 IU/mL   *Note: Due to a large number of results and/or encounters for the requested time period, some results have not been displayed. A complete set of results can be found in Results Review.   Recent Results (from the past 240 hour(s))  Surgical PCR screen     Status: None   Collection Time: 01/06/17  8:10 AM  Result Value Ref Range Status   MRSA, PCR NEGATIVE NEGATIVE Final   Staphylococcus aureus NEGATIVE NEGATIVE Final    Comment: (NOTE) The Xpert SA Assay (FDA approved for NASAL specimens in patients 28 years of age and older), is one component of a comprehensive surveillance program. It is not intended to diagnose infection nor to guide or monitor treatment. Performed at [facility], [place] [address], [place], [place] [address]     Renal Function: Recent Labs    01/03/17 1646 01/04/17 0508 01/05/17 0856 01/07/17 0716 01/08/17 0444  CREATININE 1.95* 1.94* 2.07* 3.81* 5.02*   Estimated Creatinine Clearance: 7 mL/min (A) (by C-G formula based on SCr of 5.02 mg/dL (H)).  Radiologic Imaging: US Renal  Result Date: 01/08/2017 CLINICAL DATA:  83 year old female with acute kidney injury. Hypertension. EXAM: RENAL / URINARY TRACT ULTRASOUND COMPLETE COMPARISON:  None. FINDINGS: Right Kidney: Length: 10.3 cm. Echogenicity within normal limits. No solid mass or hydronephrosis visualized. A 3.9 cm cyst within the upper right kidney identified. Left Kidney: Length: 10.4 cm. Echogenicity within normal limits. No mass or hydronephrosis visualized. Bladder: Appears normal for degree of bladder distention. IMPRESSION: Unremarkable renal ultrasound  except right renal cyst. Electronically Signed   By: Margarette Canada M.D.   On: 01/08/2017 13:07   Dg C-arm 1-60 Min  Result Date: 01/06/2017 CLINICAL DATA:  83 y/o  F; intratrochanteric nailing of left hip. EXAM: DG C-ARM 61-120 MIN; OPERATIVE LEFT HIP WITH PELVIS COMPARISON:  01/03/2017 left hip radiographs FINDINGS: Four intraoperative fluoroscopic images of left proximal femur intertrochanteric fracture nail fixation, femoral neck lag screw, and distal interlocking screw with improved angulation of proximal femur post fixation. Avulsed lesser trochanter. Fluoro time is 52 seconds. IMPRESSION: Intraoperative fluoroscopy of left intertrochanteric fracture nail fixation. Fluoro time is 52 seconds. Electronically Signed   By: Kristine Garbe M.D.   On: 01/06/2017 20:33   Dg Hip Operative Unilat W Or W/o Pelvis Left  Result Date: 01/06/2017 CLINICAL DATA:  83 y/o  F; intratrochanteric nailing of left hip. EXAM: DG C-ARM 61-120 MIN; OPERATIVE LEFT HIP WITH PELVIS COMPARISON:  01/03/2017 left hip radiographs FINDINGS: Four intraoperative fluoroscopic images of left proximal femur intertrochanteric fracture nail fixation, femoral neck lag screw, and distal interlocking screw with improved angulation of proximal femur post fixation. Avulsed lesser trochanter. Fluoro time is 52 seconds. IMPRESSION: Intraoperative fluoroscopy of left intertrochanteric fracture nail fixation. Fluoro time is 52 seconds. Electronically Signed   By: Kristine Garbe M.D.   On: 01/06/2017 20:33    I independently reviewed the above imaging studies.  Procedure:  After she was prepped and draped in the usual sterile fashion, I attempted to place a 14 Fr urethral catheter.  I could not visualize the urethra despite assistance from two nurse holding her legs to help with exposure.  She has limited mobility on both sides particularly her left hip considering her recent surgery. I attempted to guide the catheter over my  finger into her urethra but was not successful after  numerous attempts.  Impression/Recommendation: 1) Difficult urethral catheterization and acute renal failure: She does not appear to have an obstructive cause of her renal failure considering her renal ultrasound today that does not demonstrate hydronephrosis or bladder distention.  Her bladder scan was measured at 13 cc earlier today as well.  Considering these findings, I do not think placement of a urethral catheter will help from a diagnostic standpoint.  Catheterization would require further mobilization of her hips which is not likely in her best interest considering her recent surgery.  Furthermore, this will increase her risk of UTI for likely very little benefit.  I have spoken with Dr. Alfredia Ferguson and recommended we hold off on further attempts at this time.  If it is felt that despite this logic, a urethral catheter would still be necessary, please re-contact me and we will proceed with more aggressive intervention.   Fong Mccarry,LES 01/08/2017, 4:34 PM  Pryor Curia. MD   CC: Dr. Baird Kay

## 2017-01-09 DIAGNOSIS — I4891 Unspecified atrial fibrillation: Secondary | ICD-10-CM

## 2017-01-09 DIAGNOSIS — N179 Acute kidney failure, unspecified: Secondary | ICD-10-CM

## 2017-01-09 DIAGNOSIS — R778 Other specified abnormalities of plasma proteins: Secondary | ICD-10-CM

## 2017-01-09 DIAGNOSIS — L8990 Pressure ulcer of unspecified site, unspecified stage: Secondary | ICD-10-CM

## 2017-01-09 DIAGNOSIS — R7989 Other specified abnormal findings of blood chemistry: Secondary | ICD-10-CM

## 2017-01-09 DIAGNOSIS — Z7901 Long term (current) use of anticoagulants: Secondary | ICD-10-CM

## 2017-01-09 DIAGNOSIS — R945 Abnormal results of liver function studies: Secondary | ICD-10-CM

## 2017-01-09 DIAGNOSIS — Z419 Encounter for procedure for purposes other than remedying health state, unspecified: Secondary | ICD-10-CM

## 2017-01-09 DIAGNOSIS — D508 Other iron deficiency anemias: Secondary | ICD-10-CM

## 2017-01-09 DIAGNOSIS — R748 Abnormal levels of other serum enzymes: Secondary | ICD-10-CM

## 2017-01-09 LAB — CBC WITH DIFFERENTIAL/PLATELET
BASOS PCT: 0 %
Basophils Absolute: 0 10*3/uL (ref 0.0–0.1)
EOS ABS: 0 10*3/uL (ref 0.0–0.7)
Eosinophils Relative: 0 %
HCT: 20.9 % — ABNORMAL LOW (ref 36.0–46.0)
Hemoglobin: 7.1 g/dL — ABNORMAL LOW (ref 12.0–15.0)
Lymphocytes Relative: 10 %
Lymphs Abs: 1 10*3/uL (ref 0.7–4.0)
MCH: 24.3 pg — AB (ref 26.0–34.0)
MCHC: 34 g/dL (ref 30.0–36.0)
MCV: 71.6 fL — ABNORMAL LOW (ref 78.0–100.0)
MONO ABS: 0.9 10*3/uL (ref 0.1–1.0)
Monocytes Relative: 9 %
NEUTROS ABS: 8.4 10*3/uL — AB (ref 1.7–7.7)
Neutrophils Relative %: 81 %
PLATELETS: 105 10*3/uL — AB (ref 150–400)
RBC: 2.92 MIL/uL — ABNORMAL LOW (ref 3.87–5.11)
RDW: 27.3 % — AB (ref 11.5–15.5)
WBC: 10.3 10*3/uL (ref 4.0–10.5)

## 2017-01-09 LAB — PREPARE RBC (CROSSMATCH)

## 2017-01-09 LAB — BASIC METABOLIC PANEL
Anion gap: 11 (ref 5–15)
BUN: 76 mg/dL — AB (ref 6–20)
CO2: 17 mmol/L — ABNORMAL LOW (ref 22–32)
CREATININE: 6.39 mg/dL — AB (ref 0.44–1.00)
Calcium: 7.5 mg/dL — ABNORMAL LOW (ref 8.9–10.3)
Chloride: 106 mmol/L (ref 101–111)
GFR calc Af Amer: 6 mL/min — ABNORMAL LOW (ref >60)
GFR, EST NON AFRICAN AMERICAN: 5 mL/min — AB (ref >60)
Glucose, Bld: 129 mg/dL — ABNORMAL HIGH (ref 65–99)
Potassium: 5.5 mmol/L — ABNORMAL HIGH (ref 3.5–5.1)
Sodium: 134 mmol/L — ABNORMAL LOW (ref 135–145)

## 2017-01-09 LAB — COMPREHENSIVE METABOLIC PANEL
ALT: 87 U/L — ABNORMAL HIGH (ref 14–54)
ANION GAP: 10 (ref 5–15)
AST: 750 U/L — ABNORMAL HIGH (ref 15–41)
Albumin: 2.4 g/dL — ABNORMAL LOW (ref 3.5–5.0)
Alkaline Phosphatase: 154 U/L — ABNORMAL HIGH (ref 38–126)
BUN: 77 mg/dL — ABNORMAL HIGH (ref 6–20)
CALCIUM: 7.5 mg/dL — AB (ref 8.9–10.3)
CHLORIDE: 106 mmol/L (ref 101–111)
CO2: 17 mmol/L — AB (ref 22–32)
Creatinine, Ser: 6.11 mg/dL — ABNORMAL HIGH (ref 0.44–1.00)
GFR calc non Af Amer: 5 mL/min — ABNORMAL LOW (ref >60)
GFR, EST AFRICAN AMERICAN: 6 mL/min — AB (ref >60)
Glucose, Bld: 104 mg/dL — ABNORMAL HIGH (ref 65–99)
Potassium: 5.7 mmol/L — ABNORMAL HIGH (ref 3.5–5.1)
SODIUM: 133 mmol/L — AB (ref 135–145)
Total Bilirubin: 1 mg/dL (ref 0.3–1.2)
Total Protein: 5.4 g/dL — ABNORMAL LOW (ref 6.5–8.1)

## 2017-01-09 LAB — PHOSPHORUS: Phosphorus: 6.4 mg/dL — ABNORMAL HIGH (ref 2.5–4.6)

## 2017-01-09 LAB — TROPONIN I
TROPONIN I: 0.07 ng/mL — AB (ref <0.03)
Troponin I: 0.07 ng/mL (ref <0.03)
Troponin I: 0.07 ng/mL (ref <0.03)

## 2017-01-09 LAB — MAGNESIUM: Magnesium: 2.1 mg/dL (ref 1.7–2.4)

## 2017-01-09 LAB — APTT
APTT: 86 s — AB (ref 24–36)
aPTT: 118 seconds — ABNORMAL HIGH (ref 24–36)

## 2017-01-09 LAB — HEPARIN LEVEL (UNFRACTIONATED): Heparin Unfractionated: 1.86 IU/mL — ABNORMAL HIGH (ref 0.30–0.70)

## 2017-01-09 MED ORDER — SODIUM CHLORIDE 0.9 % IV BOLUS (SEPSIS): 1000.0000 mL | Freq: Once | INTRAVENOUS | Status: DC

## 2017-01-09 MED ORDER — SODIUM POLYSTYRENE SULFONATE 15 GM/60ML PO SUSP
15.0000 g | Freq: Once | ORAL | Status: AC
  Administered 2017-01-09: 15 g via ORAL
  Filled 2017-01-09: qty 60

## 2017-01-09 MED ORDER — SODIUM CHLORIDE 0.9 % IV BOLUS (SEPSIS)
1000.0000 mL | Freq: Once | INTRAVENOUS | Status: AC
  Administered 2017-01-09: 1000 mL via INTRAVENOUS

## 2017-01-09 MED ORDER — SODIUM BICARBONATE 8.4 % IV SOLN
INTRAVENOUS | Status: DC
  Administered 2017-01-09 – 2017-01-10 (×2): via INTRAVENOUS
  Filled 2017-01-09: qty 50
  Filled 2017-01-09 (×3): qty 150

## 2017-01-09 MED ORDER — SODIUM CHLORIDE 0.9 % IV BOLUS (SEPSIS)
500.0000 mL | Freq: Once | INTRAVENOUS | Status: AC
  Administered 2017-01-09: 500 mL via INTRAVENOUS

## 2017-01-09 MED ORDER — SODIUM CHLORIDE 0.9 % IV SOLN
Freq: Once | INTRAVENOUS | Status: AC
  Administered 2017-01-09: 12:00:00 via INTRAVENOUS

## 2017-01-09 NOTE — Progress Notes (Signed)
Patient BP post bolus is 98/65 with a MAP of 77. Continue to monitor BP closely.

## 2017-01-09 NOTE — Progress Notes (Signed)
ANTICOAGULATION CONSULT NOTE - Follow-Up Consult  Pharmacy Consult:  Heparin Indication: atrial fibrillation  No Known Allergies  Patient Measurements: Height: 5' 2.01" (157.5 cm) Weight: 155 lb 0 oz (70.3 kg) IBW/kg (Calculated) : 50.12 Heparin Dosing Weight: 65 kg  Assessment: Virginia Casey with history of Afib on Eliquis PTA.  Patient was transitioned to IV heparin for surgery on 01/06/17, then transitioned back to Eliquis and received one dose before transitioning back to heparin bridge.  She has AKI.  Currently using aPTT to guide heparin dosing because Eliquis can influence heparin levels.  APTT elevated. Hgb low but not much changed from 11/7.   Goal of Therapy:  Heparin level 0.3-0.7 units/ml aPTT 66 - 102 seconds Monitor platelets by anticoagulation protocol: Yes   Plan:  Dec heparin to 550 units/hr 1500 aPTT Trend Hgb  Narda Bonds, PharmD, BCPS Clinical Pharmacist Phone: [phone]

## 2017-01-09 NOTE — Progress Notes (Signed)
Subjective: Patient reports she has been very sleeping and is barely able to stay awake long enough to eat her food. She denies any pain, but feels weak. No family at bedside.  Objective: Vital signs in last 24 hours: Temp:  [97.5 F (36.4 C)-97.8 F (36.6 C)] 97.5 F (36.4 C) (11/08 0621) Pulse Rate:  [58-89] 89 (11/08 0621) Resp:  [15-16] 15 (11/08 0621) BP: (80-100)/(43-79) 80/43 (11/08 0621) SpO2:  [93 %-95 %] 94 % (11/08 0621) Weight change:   Intake/Output from previous day: 11/07 0701 - 11/08 0700 In: 957.1 [P.O.:360; I.V.:522.1; IV Piggyback:75] Out: 0  Intake/Output this shift: No intake/output data recorded.  Physical Exam: General: Vital signs reviewed and noted. Well-developed, well-nourished, in no acute distress; alert, appropriate and cooperative throughout examination.  Head: Normocephalic, atraumatic.  Eyes: PERRL, EOMI, No signs of anemia or jaundince.  Nose: Mucous membranes moist, not inflammed, nonerythematous.  Throat: Oropharynx nonerythematous, no exudate appreciated.   Neck: No deformities, masses, or tenderness noted.Supple, No carotid Bruits, no JVD.  Lungs:  Normal respiratory effort. Clear to auscultation BL without crackles or wheezes.  Heart: RRR. S1 and S2 normal without gallop, murmur, or rubs.  Abdomen:  BS normoactive. Soft, Nondistended, non-tender.  No masses or organomegaly.  Extremities: No pretibial edema.  Neurologic: A&O X3, CN II - XII are grossly intact. Motor strength is 5/5 in the all 4 extremities, Sensations intact to light touch, Cerebellar signs negative.  Skin: No visible rashes, scars.    Lab Results: Recent Labs    01/08/17 0444 01/09/17 0449  WBC 9.7 10.3  HGB 7.3* 7.1*  HCT 22.0* 20.9*  PLT 100* 105*   BMET:  Recent Labs    01/08/17 0444 01/09/17 0449  NA 134* 133*  K 5.4* 5.7*  CL 105 106  CO2 17* 17*  GLUCOSE 131* 104*  BUN 66* 77*  CREATININE 5.02* 6.11*  CALCIUM 7.7* 7.5*   No results for input(s):  PTH in the last 72 hours. Iron Studies: No results for input(s): IRON, TIBC, TRANSFERRIN, FERRITIN in the last 72 hours.  Studies/Results: US Renal  Result Date: 01/08/2017 CLINICAL DATA:  83 year old female with acute kidney injury. Hypertension. EXAM: RENAL / URINARY TRACT ULTRASOUND COMPLETE COMPARISON:  None. FINDINGS: Right Kidney: Length: 10.3 cm. Echogenicity within normal limits. No solid mass or hydronephrosis visualized. A 3.9 cm cyst within the upper right kidney identified. Left Kidney: Length: 10.4 cm. Echogenicity within normal limits. No mass or hydronephrosis visualized. Bladder: Appears normal for degree of bladder distention. IMPRESSION: Unremarkable renal ultrasound except right renal cyst. Electronically Signed   By: Margarette Canada M.D.   On: 01/08/2017 13:07    Scheduled: . amiodarone  200 mg Oral Daily  . feeding supplement (ENSURE ENLIVE)  237 mL Oral BID BM  . pantoprazole  40 mg Oral BID  . polyethylene glycol  17 g Oral Daily  . rosuvastatin  20 mg Oral QHS  . Vitamin D (Ergocalciferol)  50,000 Units Oral Weekly    Assessment/Plan: Assessment & Plan: Pt is a 83 y.o. yo female with a PMHX significant for CAD status post stent, CKD, hypertension, A. fib/flutter, anemia, thrombocytopenia  , was admitted to [facility] on 01/03/2017 after mechanical fall at home leading to left hip fracture requiring orthopedic surgery. Since admission, kidney function has severely worsened.  #Acute on chronic kidney disease, worsening This morning creatinine is 6.11 up from 5.02 yesterday. Patient continue to be hypotensive. CMP was remarkable for elevated liver function consistent with a  shock. Patient also reports increase sleepiness and weakness. Last BP was 80/43 this morning. Worsening creatinine function consistent with shock picture. Would recommended IVF bolus and increase in maintenance rate. Patient could also benefit from a transfer to step down given extensive cardiac history CAD w/  stent and afib. Continue to hold all BP pressure meds. Monitor clinical, if hypotension is sustained despite IVF, patient might require pressors.  --Discontinue amlodipine and metoprolol --Bolus 500 ml NS (order already placed) --Increase rate to 100 mL/h with bicarb (order already placed) --Monitor volume status and blood pressure --Follow-up on pm kidney function labs  #Left hip fracture status post repair Managed by orthopedic surgery weight bearing as tolerated with PT and OT. --Continue pain control when necessary  #Atrial fibrillation, controlled Patient is currently on heparin bridge and was given 1 dose of Eliquis status post surgery. Continue management per pharmacy. Will eventually transition to Eliquis.  #Hypertension Patient continue to be consistently hypotensive with sign of organ damage with worsening kidney and liver function. Continue holding all BP meds, continue with IVF and monitor for clinical decompensation and possible transfer to step down. --Discontinue metoprolol and amlodipine --Continue to monitor BP  #Urinary retention Bladder scan showed 13 mL. Renal ultrasound was gated for distended bladder or hydronephrosis. Foley was attempted was not placed due to anatomy. Low urine output likely secondary to worsening creatinine in the setting of hypoperfusion. Will continue to monitor.  DVT PPX -per primary team, currently on heparin     LOS: 6 days   Dyllon Henken 01/09/2017,11:15 AM

## 2017-01-09 NOTE — Progress Notes (Signed)
OT Cancellation Note  Patient Details Name: Virginia Casey MRN: [id number] DOB: 1925-01-21   Cancelled Treatment:    Reason Eval/Treat Not Completed: Medical issues which prohibited therapy. Hold per RN due to pt with low BP, preparing to receive blood and will be transferred to another unit to today  Britt Bottom 01/09/2017, 11:04 AM

## 2017-01-09 NOTE — Consult Note (Signed)
Name: Virginia Casey MRN: [id number] DOB: 05/26/1924    ADMISSION DATE:  01/03/2017 CONSULTATION DATE:  01/09/17  REFERRING MD :  Alfredia Ferguson  CHIEF COMPLAINT:  Hypotension   HISTORY OF PRESENT ILLNESS:  Virginia Casey is a 83 y.o. female with a PMH as outlined below.  She was admitted 11/2 after a fall where she sustained left hip fracture.  She had surgical repair 11/5 with intertrochanteric nail placed.  Post op, she had worsening renal function along with oliguria (bladder scan 11/8 with 96ml),felt to be due to hypovolemia / hypotension.  Also had borderline BP's throughout admission (and per renal notes even dating back prior to surgery after she had been started on BP meds 2 - 3 months ago).  Anti hypertensives held starting 11/7 and BP remained soft.  She had renal US 11/7 that only showed small right renal cyst.  11/8, she had persistent hypotension.  She received a total of 105mL NS bolus throughout the day. Later that evening, she was transferred to SDU and PCCM called to see in consultation.    Bladder scan performed just prior to transfer only showed 51mL.  At the time of my evaluation, pt is awake, able to answer most questions appropriately.  She has family at bedside visiting.  Skin is dry and has tenting.  Mucous membranes also dry.  Seems as if she is intravascularly depleted and would benefit from additional IV fluids.   PAST MEDICAL HISTORY :   has a past medical history of Anemia, Anxiety, Atherosclerotic heart disease, Atrial flutter, paroxysmal ([place]), Colon polyp, Diverticulosis of colon, DJD (degenerative joint disease), Exertional shortness of breath, GERD (gastroesophageal reflux disease), Hiatal hernia, History of blood transfusion (? 2010), HTN (hypertension), Hypercholesterolemia, Myocardial infarction ([place]) (2010), Pneumonia, PVD (peripheral vascular disease) ([place]), and Vitamin D deficiency.  has a past surgical history that includes Total hip arthroplasty (Right, 12/09);  Coronary angioplasty with stent (04/2008); Coronary angioplasty (10/06/2012); Dilation and curettage of uterus; Hernia repair; Fracture surgery; LEFT HIP  INTERTROCHANTRIC  NAIL (Left, 01/06/2017); Left Heart Cath and Coronary Angiography (N/A, 04/15/2016); and LEFT HEART CATHETERIZATION WITH CORONARY ANGIOGRAM (N/A, 10/06/2012). Prior to Admission medications   Medication Sig Start Date End Date Taking? Authorizing Provider  acetaminophen (TYLENOL) 325 MG tablet Take 325 mg by mouth every 6 (six) hours as needed. For pain   Yes [provider]  ALPRAZolam Duanne Moron) 0.25 MG tablet take 1 tablet by mouth three times a day if needed 12/27/16  Yes Noralee Space, MD  amiodarone (PACERONE) 200 MG tablet Take 200 mg by mouth daily.    Yes [provider]  amLODipine (NORVASC) 2.5 MG tablet Take 2.5 mg by mouth daily. 12/28/16  Yes [provider]  apixaban (ELIQUIS) 2.5 MG TABS tablet Take 2.5 mg by mouth 2 (two) times daily.   Yes [provider]  losartan (COZAAR) 100 MG tablet Take 1 tablet (100 mg total) by mouth daily. 07/18/15  Yes Noralee Space, MD  metoprolol succinate (TOPROL-XL) 50 MG 24 hr tablet Take 50 mg by mouth daily. Take with or immediately following a meal.   Yes [provider]  nitroGLYCERIN (NITROSTAT) 0.4 MG SL tablet Place 0.4 mg under the tongue every 5 (five) minutes as needed. May repeat x3    Yes [provider]  pantoprazole (PROTONIX) 40 MG tablet take 1 tablet by mouth twice a day 05/01/16  Yes Noralee Space, MD  rosuvastatin (CRESTOR) 20 MG tablet Take 20 mg  by mouth at bedtime.    Yes [provider]  Vitamin D, Ergocalciferol, (DRISDOL) 50000 units CAPS capsule take 1 capsule by mouth every week 08/09/16  Yes Noralee Space, MD  dicyclomine (BENTYL) 20 MG tablet Take 1/2-1 tablet by mouth up to 4 times a day as needed for abdominal cramping Patient not taking: Reported on 01/03/2017 07/27/14   Noralee Space, MD  ferrous  sulfate (IRON SUPPLEMENT) 325 (65 FE) MG tablet Take 1 tablet (325 mg total) by mouth daily with breakfast. Patient not taking: Reported on 01/03/2017 07/23/16   Willia Craze, NP  HYDROcodone-acetaminophen (NORCO/VICODIN) 5-325 MG tablet Take 1-2 tablets every 6 (six) hours as needed by mouth for moderate pain. 01/06/17   Nicholes Stairs, MD   No Known Allergies  FAMILY HISTORY:  family history includes Heart disease in her maternal grandfather, maternal grandmother, and mother; Lung cancer in her father; Rheum arthritis in her father and mother; Stroke in her maternal aunt. SOCIAL HISTORY:  reports that  has never smoked. she has never used smokeless tobacco. She reports that she does not drink alcohol or use drugs.  REVIEW OF SYSTEMS:   All negative; except for those that are bolded, which indicate positives.  Constitutional: weight loss, weight gain, night sweats, fevers, chills, fatigue, weakness.  HEENT: headaches, sore throat, sneezing, nasal congestion, post nasal drip, difficulty swallowing, tooth/dental problems, visual complaints, visual changes, ear aches. Neuro: difficulty with speech, weakness, numbness, ataxia. CV:  chest pain, orthopnea, PND, swelling in lower extremities, dizziness, palpitations, syncope.  Resp: cough, hemoptysis, dyspnea, wheezing. GI: heartburn, indigestion, abdominal pain, nausea, vomiting, diarrhea, constipation, change in bowel habits, loss of appetite, hematemesis, melena, hematochezia.  GU: dysuria, change in color of urine, urgency or frequency, flank pain, hematuria. MSK: joint pain or swelling, decreased range of motion, left leg pain. Psych: change in mood or affect, depression, anxiety, suicidal ideations, homicidal ideations. Skin: rash, itching, bruising.   SUBJECTIVE: Awake, answers most questions appropriately.  Denies lightheadedness, chest pain, SOB.  BP 100/71 via cuff on right arm.  VITAL SIGNS: Temp:  [97.2 F (36.2 C)-97.9  F (36.6 C)] 97.7 F (36.5 C) (11/08 1938) Pulse Rate:  [68-93] 72 (11/08 1938) Resp:  [13-24] 22 (11/08 1938) BP: (79-109)/(35-71) 102/53 (11/08 1938) SpO2:  [87 %-100 %] 100 % (11/08 1938)  PHYSICAL EXAMINATION: General: Elderly female, resting in bed, in NAD. Neuro: Awake, answers most questions appropriately. HEENT: [place]/AT. PERRL, sclerae anicteric. Cardiovascular: RRR, no M/R/G.  Lungs: Respirations even and unlabored.  CTA bilaterally, No W/R/R. Abdomen: BS x 4, soft, tenderness to deep palpation RLQ. Musculoskeletal: Left leg pain, No gross deformities, no edema.  Skin: Intact, warm, no rashes.   Recent Labs  Lab 01/08/17 0444 01/09/17 0449 01/09/17 1545  NA 134* 133* 134*  K 5.4* 5.7* 5.5*  CL 105 106 106  CO2 17* 17* 17*  BUN 66* 77* 76*  CREATININE 5.02* 6.11* 6.39*  GLUCOSE 131* 104* 129*   Recent Labs  Lab 01/07/17 0716 01/08/17 0444 01/09/17 0449  HGB 8.2* 7.3* 7.1*  HCT 25.1* 22.0* 20.9*  WBC 11.1* 9.7 10.3  PLT 84* 100* 105*   US Renal  Result Date: 01/08/2017 CLINICAL DATA:  83 year old female with acute kidney injury. Hypertension. EXAM: RENAL / URINARY TRACT ULTRASOUND COMPLETE COMPARISON:  None. FINDINGS: Right Kidney: Length: 10.3 cm. Echogenicity within normal limits. No solid mass or hydronephrosis visualized. A 3.9 cm cyst within the upper right kidney identified. Left Kidney: Length: 10.4  cm. Echogenicity within normal limits. No mass or hydronephrosis visualized. Bladder: Appears normal for degree of bladder distention. IMPRESSION: Unremarkable renal ultrasound except right renal cyst. Electronically Signed   By: Margarette Canada M.D.   On: 01/08/2017 13:07    STUDIES:  Renal US 11/7 > right renal cyst.  SIGNIFICANT EVENTS  11/2 > admit. 11/5 > OR for left hip fx repair. 11/7 > nephrology consult. 11/8 > PCCM consult.  ASSESSMENT / PLAN:  Hypotension - suspect due to hypovolemia.  Has only had 1L bolus throughout the day.  On exam, appears  dry.  Had minimal blood loss post op, but hgb 7.1 on 11/8 (she received 1u PRBC).  Does not appear to have infectious component. Plan: Additional 1L NS bolus now. Goal SBP > 90. Could likely use further boluses, defer for now and see how responds to current bolus (has already responded and SBP currently 100). Hold all anti hypertensives. Prior to d/c, needs med rec to see whether she actually needs antihypertensive regimen (agree that this could have reason for her fall if she had been hypotensive ever since starting regimen a few months ago). Assess Hgb in AM - may need additional blood.  AKI - worsening.  Presumed due to hypoperfusion.  Now with oliguria (bladder scan 11/8 with 78ml). Mild hyperkalemia - s/p temporizing measures. Hyponatremia - presumed hypovolemic. Plan: Nephrology following. Family to have discussion regarding goals of care / code status - would pt even want dialysis, etc? If nephrology and family decide to initiate renal replacement, will move to ICU and might require low dose vasopressors at that point.  A.fib. Plan: Continue heparin and transition to eliquis. F/u on echo.  Anemia. Plan: Transfuse for Hgb < 7.  Rest per primary team.   Montey Hora, [place] [facility] Pager: [phone]  or [phone] 01/09/2017, 9:42 PM

## 2017-01-09 NOTE — Progress Notes (Signed)
CRITICAL VALUE ALERT  Critical Value: Troponin 0.07  Date & Time Notied:  11/8 1202  Provider Notified: Dr. Alfredia Ferguson  Orders Received/Actions taken: No new orders at this time. Patient to be transferred to stepdown.

## 2017-01-09 NOTE — Care Management Important Message (Signed)
Important Message  Patient Details  Name: Virginia Casey MRN: [id number] Date of Birth: December 05, 1924   Medicare Important Message Given:  Yes    Jaritza Duignan Abena 01/09/2017, 1:00 PM

## 2017-01-09 NOTE — Progress Notes (Signed)
ANTICOAGULATION CONSULT NOTE - Follow-Up Consult  Pharmacy Consult:  Heparin Indication: atrial fibrillation  No Known Allergies  Patient Measurements: Height: 5' 2.01" (157.5 cm) Weight: 155 lb 0 oz (70.3 kg) IBW/kg (Calculated) : 50.12 Heparin Dosing Weight: 65 kg  Assessment: 89 YOF with history of Afib on Eliquis PTA.  Patient was transitioned to IV heparin for surgery on 01/06/17, then transitioned back to Eliquis and received one dose before transitioning back to heparin bridge.  She has AKI.  Currently using aPTT to guide heparin dosing because Eliquis can influence heparin levels.  APTT is therapeutic at 86 sec. No bleeding noted, Hgb low stable, platelets low stable.   Goal of Therapy:  Heparin level 0.3-0.7 units/ml aPTT 66 - 102 seconds Monitor platelets by anticoagulation protocol: Yes   Plan:  Continue heparin drip at 550 units/hr Confirmatory aPTT at 22:00 Monitor for s/sx of bleeding   Renold Genta, PharmD, BCPS Clinical Pharmacist Phone for today - [place] - [phone] 01/09/2017 5:19 PM

## 2017-01-09 NOTE — Progress Notes (Signed)
Critical care team in to see patient regarding low BP. Orders received to give 1,028ml bolus, bolus started per order, see EMAR. Will continue to monitor

## 2017-01-09 NOTE — Progress Notes (Signed)
PROGRESS NOTE    Virginia Casey  [id number]:[id number] DOB: 31-Oct-1924 DOA: 01/03/2017 PCP: Noralee Space, MD   Brief Narrative:  Virginia Casey is a 83 y.o. female with a history of CAD status post stents, atrial fibrillation/flutter, essential hypertension, chronic kidney disease, anemia, thrombocytopenia as well as other comorbids who presented after a fall and was found to have a left hip fracture. Orthopedic surgery was consulted and is recommended surgery on Monday, November 5 secondary to chronic anticoagulation use. Patient is s/p intramedullary implant on 11/5. Cr worsened so she was worked up and Nephrology and Urology were consulted. Worsening AKI on CKD likely from Hypotension so Anti-hypertensives discontinued and patient getting IVF Rehydrated. Cr worsened and patient remains Hypotensive so she was transferred to SDU for closer monitor, rehydrated and Cortisol, Troponin, and ECHOCardiogram being checked.   Assessment & Plan:   Active Problems:   Anemia   Essential hypertension   ATRIAL FLUTTER, PAROXYSMAL   Peripheral vascular disease (HCC)   Asthmatic bronchitis   Closed left hip fracture (HCC)   Hip fracture (HCC)   Elevated troponin   Abnormal LFTs  Left hip fracture s/p Intramedullary Implant  -S/p left subtrochanteric intramedullary implant on 01/06/17 -Appreciate orthopedic surgery recommendations: -Hydrocodone prn  Atrial Flutter -C/w Amiodarone 200 mg po Daily and Metoprolol stopped by Nephro -Holding Eliquis for now  -C/w Heparin gtt currently  -Check ECHOCardiogram  Essential Hypertension >> Hypotension  -Discontinued Amlodipine and Metoprolol -Discontinued Hydralazine IV prn  -C/w IVF Rehydration  -May need Pressors so will discuss with PCCM in AM  -Checking Troponin, Cortisol Level, and ECHOCardiogram  ? Urinary retention, less likely Transient. Voiding. +1460 mL since admission -continue to watch UOP -strict in/out -bladder scan Today showed 13  mL's -Attempted Foley Catheterization but unsuccessful and discussed with Dr. Alinda Money and will hold off for now  Acute kidney injury on CKD 3, worsening  -No documented urine in/out; Cr worsened significantly likely in the setting of Hypoperfusion from Hypotension -Nephrology consulted and stopped Anti-hypertensives -Appreciate Additional Nephrology Recommendations;  -Renal U/S was unremarkable except a Right Renal Cyst -Nephrology started Bicarbonate gtt  Hyperlipidemia -Continue Rosuvastatin 20 mg po qHS  Leukocytosis -Likely stress related due to Pain and Surgery. -Afebrile. No source for infection. Stable. -Continue to Monitor for S/Sx of Infection and repeat CBC in AM  Thrombocytopenia -Improving. Likely stress related. -Continue to trend CBC and Monitor for S/Sx of Bleeding   Hyperkalemia -Given Kayexleate x1 -C/w Bicarb Gtt -Repeat CMP in AM   Elevated Troponin -Troponin was 0.07 x2 and Flat -Likely in the setting of Hypotension and Worsening Renal Failure -Continue to Trend -Check ECHOCardiogram   ABLA in the setting of AoCKD -Hb/Hct Trending down and trended down to 7.1/20.9 -Transfusion of 1 unit of pRBC's -Continue to Monitor for S/Sx of Bleeding -Repeat CBC in AM  Abnormal LFT's/Transaminitis -AST went to 750 and ALT 87 -Likely 2/2 to Hypoperfusion -C/w IVF Rehydration -Repeat CMP in AM and if worsening consider obtaining RUQ U/S and LFT's  DVT prophylaxis: SCD's; Anticoagulated with Heparin gtt Code Status: FULL CODE Family Communication: Discussed with Daughter at bedside Disposition Plan: SNF when medically stable  Consultants:   Orthopedic Surgery  Nephrology  Urology   Procedures:  Left Hip Fracture s/p Left Hip Intertrochanteric Nail   Antimicrobials: Anti-infectives (From admission, onward)   Start     Dose/Rate Route Frequency Ordered Stop   01/07/17 0500  ceFAZolin (ANCEF) IVPB 1 g/50 mL premix  1 g 100 mL/hr over 30  Minutes Intravenous Every 12 hours 01/06/17 2007 01/07/17 1723   01/06/17 1715  ceFAZolin (ANCEF) IVPB 2g/100 mL premix     2 g 200 mL/hr over 30 Minutes Intravenous To [facility]Stay [facility] 01/06/17 1648 01/06/17 1730   01/06/17 1649  ceFAZolin (ANCEF) 2-4 GM/100ML-% IVPB    Comments:  Grace Blight   : cabinet override      01/06/17 1649 01/06/17 1730     Subjective: Seen and examined and she stated she was not doing good because she was worried about her BP. No CP but stated she did not feel well. No lightheadedness or dizziness. No other concerns or complaints at this time.   Objective: Vitals:   01/09/17 1709 01/09/17 1829 01/09/17 1830 01/09/17 1938  BP: 96/63 (!) 90/56 (!) 86/51 (!) 102/53  Pulse: 70 69 68 72  Resp: (!) 24 20 19  (!) 22  Temp:    97.7 F (36.5 C)  TempSrc:    Oral  SpO2: 100% (!) 87% 98% 100%  Weight:      Height:        Intake/Output Summary (Last 24 hours) at 01/09/2017 2058 Last data filed at 01/09/2017 1808 Gross per 24 hour  Intake 927.09 ml  Output -  Net 927.09 ml   Filed Weights   01/04/17 1300 01/06/17 1617  Weight: 70.3 kg (155 lb) 70.3 kg (155 lb 0 oz)   Examination: Physical Exam:  Constitutional: WN/WD AAF who is extremely anxious  Eyes: Sclerae anicteric. Lids normal ENMT: External Ears and nose appear normal. MMM are dry Neck: Appears supple with no JVD. Respiratory: Diminished to auscultation bilaterally, no appreciable wheezing/rales/rhonchi. No accessory muscle use Cardiovascular: Irregular rhythm. No appreciable m/r/g. No extremity edema Abdomen: Soft, NT, ND. Bowel sounds present GU: Deferred Musculoskeletal: No contractures. No cyanosis  Skin: Warm and Dry. Left Hip Incisions appear C/D/I Neurologic: CN 2-12 grossly intact. No appreciable focal deficits Psychiatric: Anxious Mood and Affect. Awake and Alert  Data Reviewed: I have personally reviewed following labs and imaging studies  CBC: Recent Labs  Lab 01/05/17 0856  01/06/17 0549 01/07/17 0716 01/08/17 0444 01/09/17 0449  WBC 12.3* 12.5* 11.1* 9.7 10.3  NEUTROABS  --   --   --   --  8.4*  HGB 10.3* 8.6* 8.2* 7.3* 7.1*  HCT 31.5* 25.9* 25.1* 22.0* 20.9*  MCV 71.9* 71.2* 72.3* 71.2* 71.6*  PLT 76* 73* 84* 100* 024*   Basic Metabolic Panel: Recent Labs  Lab 01/05/17 0856 01/07/17 0716 01/08/17 0444 01/09/17 0449 01/09/17 1545  NA 136 135 134* 133* 134*  K 4.3 5.0 5.4* 5.7* 5.5*  CL 104 105 105 106 106  CO2 22 19* 17* 17* 17*  GLUCOSE 120* 116* 131* 104* 129*  BUN 29* 48* 66* 77* 76*  CREATININE 2.07* 3.81* 5.02* 6.11* 6.39*  CALCIUM 8.8* 8.1* 7.7* 7.5* 7.5*  MG  --   --   --  2.1  --   PHOS  --   --   --  6.4*  --    GFR: Estimated Creatinine Clearance: 5.5 mL/min (A) (by C-G formula based on SCr of 6.39 mg/dL (H)). Liver Function Tests: Recent Labs  Lab 01/09/17 0449  AST 750*  ALT 87*  ALKPHOS 154*  BILITOT 1.0  PROT 5.4*  ALBUMIN 2.4*   No results for input(s): LIPASE, AMYLASE in the last 168 hours. No results for input(s): AMMONIA in the last 168 hours. Coagulation Profile: No results  for input(s): INR, PROTIME in the last 168 hours. Cardiac Enzymes: Recent Labs  Lab 01/09/17 1033 01/09/17 1545  TROPONINI 0.07* 0.07*   BNP (last 3 results) No results for input(s): PROBNP in the last 8760 hours. HbA1C: No results for input(s): HGBA1C in the last 72 hours. CBG: No results for input(s): GLUCAP in the last 168 hours. Lipid Profile: No results for input(s): CHOL, HDL, LDLCALC, TRIG, CHOLHDL, LDLDIRECT in the last 72 hours. Thyroid Function Tests: No results for input(s): TSH, T4TOTAL, FREET4, T3FREE, THYROIDAB in the last 72 hours. Anemia Panel: No results for input(s): VITAMINB12, FOLATE, FERRITIN, TIBC, IRON, RETICCTPCT in the last 72 hours. Sepsis Labs: No results for input(s): PROCALCITON, LATICACIDVEN in the last 168 hours.  Recent Results (from the past 240 hour(s))  Surgical PCR screen     Status: None    Collection Time: 01/06/17  8:10 AM  Result Value Ref Range Status   MRSA, PCR NEGATIVE NEGATIVE Final   Staphylococcus aureus NEGATIVE NEGATIVE Final    Comment: (NOTE) The Xpert SA Assay (FDA approved for NASAL specimens in patients 20 years of age and older), is one component of a comprehensive surveillance program. It is not intended to diagnose infection nor to guide or monitor treatment. Performed at [facility], [place] [address], [place], [place] [address]     Radiology Studies: US Renal  Result Date: 01/08/2017 CLINICAL DATA:  83 year old female with acute kidney injury. Hypertension. EXAM: RENAL / URINARY TRACT ULTRASOUND COMPLETE COMPARISON:  None. FINDINGS: Right Kidney: Length: 10.3 cm. Echogenicity within normal limits. No solid mass or hydronephrosis visualized. A 3.9 cm cyst within the upper right kidney identified. Left Kidney: Length: 10.4 cm. Echogenicity within normal limits. No mass or hydronephrosis visualized. Bladder: Appears normal for degree of bladder distention. IMPRESSION: Unremarkable renal ultrasound except right renal cyst. Electronically Signed   By: Margarette Canada M.D.   On: 01/08/2017 13:07   Scheduled Meds: . amiodarone  200 mg Oral Daily  . feeding supplement (ENSURE ENLIVE)  237 mL Oral BID BM  . pantoprazole  40 mg Oral BID  . polyethylene glycol  17 g Oral Daily  . rosuvastatin  20 mg Oral QHS  . Vitamin D (Ergocalciferol)  50,000 Units Oral Weekly   Continuous Infusions: . sodium chloride Stopped (01/08/17 1500)  . heparin 550 Units/hr (01/09/17 1314)  .  sodium bicarbonate  infusion 1000 mL 125 mL/hr at 01/09/17 1436  . sodium chloride      LOS: 6 days   Kerney Elbe, DO [facility] Pager [phone]  If 7PM-7AM, please contact night-coverage [email or web address] Password [facility] 01/09/2017, 8:58 PM

## 2017-01-10 ENCOUNTER — Inpatient Hospital Stay (HOSPITAL_COMMUNITY): Payer: Medicare Other

## 2017-01-10 ENCOUNTER — Encounter (HOSPITAL_COMMUNITY): Payer: Medicare Other

## 2017-01-10 DIAGNOSIS — I361 Nonrheumatic tricuspid (valve) insufficiency: Secondary | ICD-10-CM

## 2017-01-10 DIAGNOSIS — W010XXA Fall on same level from slipping, tripping and stumbling without subsequent striking against object, initial encounter: Secondary | ICD-10-CM

## 2017-01-10 DIAGNOSIS — S72142A Displaced intertrochanteric fracture of left femur, initial encounter for closed fracture: Principal | ICD-10-CM

## 2017-01-10 LAB — DIFFERENTIAL
BASOS PCT: 0 %
Basophils Absolute: 0 10*3/uL (ref 0.0–0.1)
EOS PCT: 0 %
Eosinophils Absolute: 0 10*3/uL (ref 0.0–0.7)
LYMPHS ABS: 1.1 10*3/uL (ref 0.7–4.0)
Lymphocytes Relative: 11 %
MONO ABS: 0.8 10*3/uL (ref 0.1–1.0)
Monocytes Relative: 8 %
NEUTROS ABS: 7.9 10*3/uL — AB (ref 1.7–7.7)
Neutrophils Relative %: 81 %

## 2017-01-10 LAB — TYPE AND SCREEN
ABO/RH(D): O POS
ANTIBODY SCREEN: NEGATIVE
Unit division: 0

## 2017-01-10 LAB — CBC
HEMATOCRIT: 24 % — AB (ref 36.0–46.0)
Hemoglobin: 8.1 g/dL — ABNORMAL LOW (ref 12.0–15.0)
MCH: 24.5 pg — AB (ref 26.0–34.0)
MCHC: 33.8 g/dL (ref 30.0–36.0)
MCV: 72.5 fL — AB (ref 78.0–100.0)
Platelets: 96 10*3/uL — ABNORMAL LOW (ref 150–400)
RBC: 3.31 MIL/uL — AB (ref 3.87–5.11)
RDW: 24.4 % — AB (ref 11.5–15.5)
WBC: 9.8 10*3/uL (ref 4.0–10.5)

## 2017-01-10 LAB — BPAM RBC
BLOOD PRODUCT EXPIRATION DATE: 201811152359
ISSUE DATE / TIME: 201811081130
Unit Type and Rh: 5100

## 2017-01-10 LAB — ECHOCARDIOGRAM COMPLETE
Ao-asc: 19 cm
CHL CUP TV REG PEAK VELOCITY: 373 cm/s
EWDT: 180 ms
FS: 45 % — AB (ref 28–44)
Height: 62.008 in
IVS/LV PW RATIO, ED: 1.28
LA ID, A-P, ES: 47 mm
LA diam end sys: 47 mm
LA diam index: 2.65 cm/m2
LA vol A4C: 82.3 ml
LAVOL: 73.9 mL
LAVOLIN: 41.6 mL/m2
LDCA: 2.27 cm2
LVOTD: 17 mm
MV Dec: 180
MV Peak grad: 10 mmHg
MV pk A vel: 45.4 m/s
MVPKEVEL: 157 m/s
PW: 8.39 mm — AB (ref 0.6–1.1)
RV TAPSE: 15.6 mm
TRMAXVEL: 373 cm/s
Weight: 2480.02 oz

## 2017-01-10 LAB — COMPREHENSIVE METABOLIC PANEL
ALBUMIN: 2.1 g/dL — AB (ref 3.5–5.0)
ALK PHOS: 170 U/L — AB (ref 38–126)
ALT: 41 U/L (ref 14–54)
AST: 736 U/L — AB (ref 15–41)
Anion gap: 11 (ref 5–15)
BILIRUBIN TOTAL: 1.1 mg/dL (ref 0.3–1.2)
BUN: 78 mg/dL — AB (ref 6–20)
CO2: 21 mmol/L — ABNORMAL LOW (ref 22–32)
CREATININE: 6.53 mg/dL — AB (ref 0.44–1.00)
Calcium: 7 mg/dL — ABNORMAL LOW (ref 8.9–10.3)
Chloride: 104 mmol/L (ref 101–111)
GFR calc Af Amer: 6 mL/min — ABNORMAL LOW (ref >60)
GFR calc non Af Amer: 5 mL/min — ABNORMAL LOW (ref >60)
GLUCOSE: 136 mg/dL — AB (ref 65–99)
Potassium: 4.9 mmol/L (ref 3.5–5.1)
Sodium: 136 mmol/L (ref 135–145)
TOTAL PROTEIN: 4.5 g/dL — AB (ref 6.5–8.1)

## 2017-01-10 LAB — PHOSPHORUS: PHOSPHORUS: 6.5 mg/dL — AB (ref 2.5–4.6)

## 2017-01-10 LAB — MAGNESIUM: Magnesium: 2 mg/dL (ref 1.7–2.4)

## 2017-01-10 LAB — HEPARIN LEVEL (UNFRACTIONATED): Heparin Unfractionated: 1.06 IU/mL — ABNORMAL HIGH (ref 0.30–0.70)

## 2017-01-10 LAB — APTT: aPTT: 81 seconds — ABNORMAL HIGH (ref 24–36)

## 2017-01-10 LAB — CORTISOL-AM, BLOOD: Cortisol - AM: 15 ug/dL (ref 6.7–22.6)

## 2017-01-10 MED ORDER — BISACODYL 10 MG RE SUPP
10.0000 mg | Freq: Every day | RECTAL | Status: DC | PRN
Start: 2017-01-10 — End: 2017-01-13

## 2017-01-10 MED ORDER — SENNOSIDES-DOCUSATE SODIUM 8.6-50 MG PO TABS
1.0000 | ORAL_TABLET | Freq: Two times a day (BID) | ORAL | Status: DC
  Administered 2017-01-10 – 2017-01-12 (×5): 1 via ORAL
  Filled 2017-01-10 (×7): qty 1

## 2017-01-10 MED ORDER — SODIUM CHLORIDE 0.9 % IV BOLUS (SEPSIS)
250.0000 mL | Freq: Once | INTRAVENOUS | Status: AC
  Administered 2017-01-10: 250 mL via INTRAVENOUS

## 2017-01-10 MED ORDER — POLYETHYLENE GLYCOL 3350 17 G PO PACK
17.0000 g | PACK | Freq: Two times a day (BID) | ORAL | Status: DC
  Administered 2017-01-10 – 2017-01-12 (×5): 17 g via ORAL
  Filled 2017-01-10 (×6): qty 1

## 2017-01-10 MED ORDER — SODIUM CHLORIDE 0.9 % IV BOLUS (SEPSIS)
500.0000 mL | Freq: Once | INTRAVENOUS | Status: AC
  Administered 2017-01-10: 500 mL via INTRAVENOUS

## 2017-01-10 NOTE — Progress Notes (Signed)
Dr Kennon Holter stated to keep monitoring and notify if MAP < 60. Monitoring continues

## 2017-01-10 NOTE — Progress Notes (Signed)
PROGRESS NOTE    GHADA ABBETT  FIE:[id number] DOB: 1924/11/22 DOA: 01/03/2017 PCP: Noralee Space, MD   Brief Narrative:  Virginia Casey is a 83 y.o. female with a history of CAD status post stents, atrial fibrillation/flutter, essential hypertension, chronic kidney disease, anemia, thrombocytopenia as well as other comorbids who presented after a fall and was found to have a left hip fracture. Orthopedic surgery was consulted and is recommended surgery on Monday, November 5 secondary to chronic anticoagulation use. Patient is s/p intramedullary implant on 11/5. Cr worsened so she was worked up and Nephrology and Urology were consulted. Worsening AKI on CKD likely from Hypotension so Anti-hypertensives discontinued and patient getting IVF Rehydrated. Cr worsened and patient remains Hypotensive so she was transferred to SDU for closer monitor, rehydrated and Cortisol, Troponin, and ECHOCardiogram being checked.   Assessment & Plan:   Active Problems:   Anemia   Essential hypertension   ATRIAL FLUTTER, PAROXYSMAL   Peripheral vascular disease (HCC)   Asthmatic bronchitis   Closed left hip fracture (HCC)   Hip fracture (HCC)   Elevated troponin   Abnormal LFTs   Pressure injury of skin   Atrial fibrillation (HCC)   Anticoagulant long-term use   Surgery, elective   AKI (acute kidney injury) ([place])  Left hip fracture s/p Intramedullary Implant  -S/p left subtrochanteric intramedullary implant on 01/06/17 -Appreciate Orthopedic surgery recommendations: -Hydrocodone prn -C/w PT/OT; Recommending SNF  Atrial Flutter -C/w Amiodarone 200 mg po Daily and Metoprolol stopped by Nephro -Holding Eliquis for now given Renal Fxn  -C/w Heparin gtt currently  -Checked ECHOCardiogram as below   Essential Hypertension >> Hypotension  -Discontinued Amlodipine and Metoprolol -Discontinued Hydralazine IV prn  -C/w IVF Rehydration; Give IVF Boluses  -Discussed with PCCM and appreciated their  evaluation; Given IVF and Map is Adequate for now but states to maintain positive Balance  -Checked Troponin and stable at 0.07 , Cortisol Level normal, and ECHOCardiogram as below -Continue to Monitor Volume Status   ? Urinary retention, less likely Transient. Voiding. +1460 mL since admission -continue to watch UOP -strict in/out -bladder scan showed 13 mL's; repeat Bladder Scan pending  -Urology Attempted Foley Catheterization but unsuccessful and discussed with Dr. Alinda Money and will hold off for now -Nephrology rechecking Bladder Scan  Acute kidney injury on CKD 3, worsening  -No documented urine in/out; Cr worsened significantly likely in the setting of Hypoperfusion from Hypotension; Cr now 6.53 -Nephrology consulted and stopped Anti-hypertensives -Appreciate Additional Nephrology Recommendations;  -Renal U/S was unremarkable except a Right Renal Cyst -Nephrology started Bicarbonate gtt and switching to NS  Hyperlipidemia -Continue Rosuvastatin 20 mg po qHS  Leukocytosis -Likely stress related due to Pain and Surgery. -Afebrile. No source for infection. Stable. -Continue to Monitor for S/Sx of Infection and repeat CBC in AM  Thrombocytopenia -Improving. Likely stress related. -Continue to trend CBC and Monitor for S/Sx of Bleeding   Hyperkalemia, improved -Given Kayexleate x1 -Bicarb Gtt changed to NS -Repeat CMP in AM   Elevated Troponin -Troponin was 0.07 x3 and Flat -Likely in the setting of Hypotension and Worsening Renal Failure -Continue to Trend -Check ECHOCardiogram   ABLA in the setting of AoCKD -Hb/Hct Trending down and trended down to 7.1/20.9 -Transfusion of 1 unit of pRBC's and Hb/Hct improved to 8.1/24.0 -Continue to Monitor for S/Sx of Bleeding -Repeat CBC in AM  Abnormal LFT's/Transaminitis -AST went to 750 -> 736 and ALT 87 -> 41 -Likely 2/2 to Hypoperfusion -C/w IVF Rehydration -Repeat CMP  in AM and if worsening consider obtaining RUQ U/S  and LFT's  DVT prophylaxis: SCD's; Anticoagulated with Heparin gtt Code Status: FULL CODE Family Communication: Discussed with Family at bedside  Disposition Plan: SNF when medically stable  Consultants:   Orthopedic Surgery  Nephrology  Urology  PCCM   Procedures:  Left Hip Fracture s/p Left Hip Intertrochanteric Nail   ECHOCARDIOGRAM ------------------------------------------------------------------- Study Conclusions  - Left ventricle: The cavity size was normal. Wall thickness was   normal. Systolic function was vigorous. The estimated ejection   fraction was in the range of 65% to 70%. Wall motion was normal;   there were no regional wall motion abnormalities. - Mitral valve: Moderately calcified annulus. Moderately thickened,   moderately calcified leaflets . There was mild regurgitation. - Left atrium: The atrium was moderately dilated. - Tricuspid valve: There was mild-moderate regurgitation. - Pulmonary arteries: Systolic pressure was moderately increased.   PA peak pressure: 59 mm Hg (S).  Antimicrobials: Anti-infectives (From admission, onward)   Start     Dose/Rate Route Frequency Ordered Stop   01/07/17 0500  ceFAZolin (ANCEF) IVPB 1 g/50 mL premix     1 g 100 mL/hr over 30 Minutes Intravenous Every 12 hours 01/06/17 2007 01/07/17 1723   01/06/17 1715  ceFAZolin (ANCEF) IVPB 2g/100 mL premix     2 g 200 mL/hr over 30 Minutes Intravenous To [facility]Stay [facility] 01/06/17 1648 01/06/17 1730   01/06/17 1649  ceFAZolin (ANCEF) 2-4 GM/100ML-% IVPB    Comments:  Grace Blight   : cabinet override      01/06/17 1649 01/06/17 1730     Subjective: Seen and examined and she was doing ok. Was happier that BP was higher. No CP or SOB. No other concerns or complaints at this time.    Objective: Vitals:   01/10/17 0358 01/10/17 0400 01/10/17 0500 01/10/17 0530  BP: (!) 81/51 (!) 86/54 91/69 (!) 92/53  Pulse: 62 70  64  Resp: 19 (!) 21 (!) 22 17  Temp:  (!)  97.3 F (36.3 C)    TempSrc:  Oral    SpO2: 100% 100%  100%  Weight:      Height:        Intake/Output Summary (Last 24 hours) at 01/10/2017 0744 Last data filed at 01/09/2017 2317 Gross per 24 hour  Intake 3255.23 ml  Output -  Net 3255.23 ml   Filed Weights   01/04/17 1300 01/06/17 1617  Weight: 70.3 kg (155 lb) 70.3 kg (155 lb 0 oz)   Examination: Physical Exam:  Constitutional: WN/WD AAF in NAD appears better than yesterday.  Eyes: Sclerae anicteric. Lids normal ENMT: External Ears and nose appear normal Neck: Supple with no JVD Respiratory: Diminished to auscultation bui no wheezing/rales/rhonchi Cardiovascular: RRR; No appreciable extremity edema Abdomen: Soft, NT, ND. Bowel sounds present GU: Deferred Musculoskeletal: No contractures. No cyanosis Skin: Warm and dry. No rashes or lesions on a limited skin eval. Left Hip incisions appear C/D/I Neurologic: CN 2-12 grossly intact no appreciable focal deficits Psychiatric: Normal mood and affect. Intact judgement and insight  Data Reviewed: I have personally reviewed following labs and imaging studies  CBC: Recent Labs  Lab 01/06/17 0549 01/07/17 0716 01/08/17 0444 01/09/17 0449 01/10/17 0357  WBC 12.5* 11.1* 9.7 10.3 9.8  NEUTROABS  --   --   --  8.4* 7.9*  HGB 8.6* 8.2* 7.3* 7.1* 8.1*  HCT 25.9* 25.1* 22.0* 20.9* 24.0*  MCV 71.2* 72.3* 71.2* 71.6* 72.5*  PLT 73*  84* 100* 105* 96*   Basic Metabolic Panel: Recent Labs  Lab 01/07/17 0716 01/08/17 0444 01/09/17 0449 01/09/17 1545 01/10/17 0357  NA 135 134* 133* 134* 136  K 5.0 5.4* 5.7* 5.5* 4.9  CL 105 105 106 106 104  CO2 19* 17* 17* 17* 21*  GLUCOSE 116* 131* 104* 129* 136*  BUN 48* 66* 77* 76* 78*  CREATININE 3.81* 5.02* 6.11* 6.39* 6.53*  CALCIUM 8.1* 7.7* 7.5* 7.5* 7.0*  MG  --   --  2.1  --  2.0  PHOS  --   --  6.4*  --  6.5*   GFR: Estimated Creatinine Clearance: 5.4 mL/min (A) (by C-G formula based on SCr of 6.53 mg/dL (H)). Liver  Function Tests: Recent Labs  Lab 01/09/17 0449 01/10/17 0357  AST 750* 736*  ALT 87* 41  ALKPHOS 154* 170*  BILITOT 1.0 1.1  PROT 5.4* 4.5*  ALBUMIN 2.4* 2.1*   No results for input(s): LIPASE, AMYLASE in the last 168 hours. No results for input(s): AMMONIA in the last 168 hours. Coagulation Profile: No results for input(s): INR, PROTIME in the last 168 hours. Cardiac Enzymes: Recent Labs  Lab 01/09/17 1033 01/09/17 1545 01/09/17 2101  TROPONINI 0.07* 0.07* 0.07*   BNP (last 3 results) No results for input(s): PROBNP in the last 8760 hours. HbA1C: No results for input(s): HGBA1C in the last 72 hours. CBG: No results for input(s): GLUCAP in the last 168 hours. Lipid Profile: No results for input(s): CHOL, HDL, LDLCALC, TRIG, CHOLHDL, LDLDIRECT in the last 72 hours. Thyroid Function Tests: No results for input(s): TSH, T4TOTAL, FREET4, T3FREE, THYROIDAB in the last 72 hours. Anemia Panel: No results for input(s): VITAMINB12, FOLATE, FERRITIN, TIBC, IRON, RETICCTPCT in the last 72 hours. Sepsis Labs: No results for input(s): PROCALCITON, LATICACIDVEN in the last 168 hours.  Recent Results (from the past 240 hour(s))  Surgical PCR screen     Status: None   Collection Time: 01/06/17  8:10 AM  Result Value Ref Range Status   MRSA, PCR NEGATIVE NEGATIVE Final   Staphylococcus aureus NEGATIVE NEGATIVE Final    Comment: (NOTE) The Xpert SA Assay (FDA approved for NASAL specimens in patients 53 years of age and older), is one component of a comprehensive surveillance program. It is not intended to diagnose infection nor to guide or monitor treatment. Performed at [facility], [place] [address] [place], [place] [address]     Radiology Studies: US Renal  Result Date: 01/08/2017 CLINICAL DATA:  83 year old female with acute kidney injury. Hypertension. EXAM: RENAL / URINARY TRACT ULTRASOUND COMPLETE COMPARISON:  None. FINDINGS: Right Kidney: Length: 10.3  cm. Echogenicity within normal limits. No solid mass or hydronephrosis visualized. A 3.9 cm cyst within the upper right kidney identified. Left Kidney: Length: 10.4 cm. Echogenicity within normal limits. No mass or hydronephrosis visualized. Bladder: Appears normal for degree of bladder distention. IMPRESSION: Unremarkable renal ultrasound except right renal cyst. Electronically Signed   By: Margarette Canada M.D.   On: 01/08/2017 13:07   Scheduled Meds: . amiodarone  200 mg Oral Daily  . feeding supplement (ENSURE ENLIVE)  237 mL Oral BID BM  . pantoprazole  40 mg Oral BID  . polyethylene glycol  17 g Oral Daily  . rosuvastatin  20 mg Oral QHS  . Vitamin D (Ergocalciferol)  50,000 Units Oral Weekly   Continuous Infusions: . sodium chloride Stopped (01/08/17 1500)  . heparin 550 Units/hr (01/09/17 1314)  .  sodium bicarbonate  infusion 1000 mL 125 mL/hr at 01/10/17 0131  . sodium chloride      LOS: 7 days   Kerney Elbe, DO [facility] Pager [phone]  If 7PM-7AM, please contact night-coverage [email or web address] Password [facility] 01/10/2017, 7:44 AM

## 2017-01-10 NOTE — Progress Notes (Signed)
Patient BP is 81/51 MAP is 59. Text paged Dr Kennon Holter.

## 2017-01-10 NOTE — Progress Notes (Signed)
  Echocardiogram 2D Echocardiogram has been performed.  Virginia Casey 01/10/2017, 12:03 PM

## 2017-01-10 NOTE — Progress Notes (Signed)
Received order for 500 ml bolus NS. Bolus given, see EMAR

## 2017-01-10 NOTE — Care Management Note (Signed)
Case Management Note  Patient Details  Name: Virginia Casey MRN: [id number] Date of Birth: 01-06-1925  Subjective/Objective:    Patient was transferred to SDU for closer monitor given sustained hypotension and shock picture that she developed in the past 24 hours. LFTs are stable.               Action/Plan:  Discharge plan is for SNF - CSW following   Expected Discharge Date:                  Expected Discharge Plan:  [place]  In-House Referral:  Clinical Social Work  Discharge planning Services  CM Consult  Post Acute Care Choice:    Choice offered to:     DME Arranged:    DME Agency:     HH Arranged:    [place] Agency:     Status of Service:  In process, will continue to follow  If discussed at Long Length of Stay Meetings, dates discussed:    Additional Comments:  [name], RN 01/10/2017, 1:51 PM

## 2017-01-10 NOTE — Progress Notes (Signed)
BP is 82/59 HR is 72 MAP is 68. No urine out put. Bladder scan shows 49ml. Text Paged Dr Kennon Holter

## 2017-01-10 NOTE — Progress Notes (Signed)
ANTICOAGULATION CONSULT NOTE - Follow-Up Consult  Pharmacy Consult:  Heparin Indication: atrial fibrillation  No Known Allergies  Patient Measurements: Height: 5' 2.01" (157.5 cm) Weight: 155 lb 0 oz (70.3 kg) IBW/kg (Calculated) : 50.12 Heparin Dosing Weight: 65 kg  Assessment: 89 YOF with history of Afib on Eliquis PTA.  Patient was transitioned to IV heparin for surgery on 01/06/17, then transitioned back to Eliquis and received one dose before transitioning back to heparin bridge.  She has AKI.  Currently using aPTT to guide heparin dosing because Eliquis can influence heparin levels.  APTT is therapeutic x 2. No bleeding noted, Hgb low stable, platelets low stable.   Goal of Therapy:  Heparin level 0.3-0.7 units/ml aPTT 66 - 102 seconds Monitor platelets by anticoagulation protocol: Yes   Plan:  Continue heparin drip at 550 units/hr Daily CBC/HL/aPTT Monitor for s/sx of bleeding  Narda Bonds, PharmD, BCPS Clinical Pharmacist Phone: [phone]

## 2017-01-10 NOTE — Progress Notes (Signed)
ANTICOAGULATION CONSULT NOTE - Follow-Up Consult  Pharmacy Consult:  Heparin Indication: atrial fibrillation  No Known Allergies  Patient Measurements: Height: 5' 2.01" (157.5 cm) Weight: 155 lb 0 oz (70.3 kg) IBW/kg (Calculated) : 50.12 Heparin Dosing Weight: 65 kg  Assessment: 89 YOF with history of Afib on Eliquis PTA.  Patient was transitioned to IV heparin for surgery on 01/06/17, then transitioned back to Eliquis and received one dose before transitioning back to heparin bridge.  She has AKI. Currently using aPTT to guide heparin dosing because Eliquis can influence heparin levels.  Noted plans to establish goals of care. -HL= 1.06, aPTT= 81 -hg= 8.1, plt= 96 (history of thrombocytopenia)  Goal of Therapy:  Heparin level 0.3-0.7 units/ml aPTT 66 - 102 seconds Monitor platelets by anticoagulation protocol: Yes   Plan:  Continue heparin drip at 550 units/hr Daily CBC/HL/aPTT  Hildred Laser, Pharm D 01/10/2017 9:52 AM

## 2017-01-10 NOTE — Progress Notes (Signed)
Post 500 ml bolus BP is 91/69 MAP 77. Will continue to monitor.

## 2017-01-10 NOTE — Progress Notes (Signed)
Name: Virginia Casey MRN: [id number] DOB: 1925/02/04    ADMISSION DATE:  01/03/2017 CONSULTATION DATE:  01/09/17  REFERRING MD :  Alfredia Ferguson  CHIEF COMPLAINT:  Hypotension   HISTORY OF PRESENT ILLNESS:  Virginia Casey is a 83 y.o. female with a PMH as outlined below.  She was admitted 11/2 after a fall where she sustained left hip fracture.  She had surgical repair 11/5 with intertrochanteric nail placed.  Post op, she had worsening renal function along with oliguria (bladder scan 11/8 with 56ml),felt to be due to hypovolemia / hypotension.  Also had borderline BP's throughout admission (and per renal notes even dating back prior to surgery after she had been started on BP meds 2 - 3 months ago).  Anti hypertensives held starting 11/7 and BP remained soft.  She had renal US 11/7 that only showed small right renal cyst.  11/8, she had persistent hypotension.  She received a total of 1051mL NS bolus throughout the day. Later that evening, she was transferred to SDU and PCCM called to see in consultation.    Bladder scan performed just prior to transfer only showed 52mL.  At the time of my evaluation, pt is awake, able to answer most questions appropriately.  She has family at bedside visiting.  Skin is dry and has tenting.  Mucous membranes also dry.  Seems as if she is intravascularly depleted and would benefit from additional IV fluids.     SUBJECTIVE: Follows commands.  No acute distress at rest.  Currently systolic blood pressure is 100  VITAL SIGNS: Temp:  [97.2 F (36.2 C)-97.9 F (36.6 C)] 97.6 F (36.4 C) (11/09 0830) Pulse Rate:  [25-93] 71 (11/09 0900) Resp:  [12-24] 23 (11/09 0900) BP: (76-109)/(35-71) 100/52 (11/09 0900) SpO2:  [87 %-100 %] 99 % (11/09 0900)  PHYSICAL EXAMINATION: General: Elderly African-American female who arouses to voice and follows simple commands. HEENT: Edentulous no JVD appreciated no lymphadenopathy PSY: Dull effect Neuro: Follows commands opens eyes to  commands. CV: Heart sounds are regular PULM: Air movement is decreased poor excursion no acute distress TD:DUKG, non-tender, bsx4 active  Extremities: Left hip dressing with some drainage.  Left foot is warm she is status post left hip surgery. Skin: no rashes or lesions    Recent Labs  Lab 01/09/17 0449 01/09/17 1545 01/10/17 0357  NA 133* 134* 136  K 5.7* 5.5* 4.9  CL 106 106 104  CO2 17* 17* 21*  BUN 77* 76* 78*  CREATININE 6.11* 6.39* 6.53*  GLUCOSE 104* 129* 136*   Recent Labs  Lab 01/08/17 0444 01/09/17 0449 01/10/17 0357  HGB 7.3* 7.1* 8.1*  HCT 22.0* 20.9* 24.0*  WBC 9.7 10.3 9.8  PLT 100* 105* 96*   US Renal  Result Date: 01/08/2017 CLINICAL DATA:  83 year old female with acute kidney injury. Hypertension. EXAM: RENAL / URINARY TRACT ULTRASOUND COMPLETE COMPARISON:  None. FINDINGS: Right Kidney: Length: 10.3 cm. Echogenicity within normal limits. No solid mass or hydronephrosis visualized. A 3.9 cm cyst within the upper right kidney identified. Left Kidney: Length: 10.4 cm. Echogenicity within normal limits. No mass or hydronephrosis visualized. Bladder: Appears normal for degree of bladder distention. IMPRESSION: Unremarkable renal ultrasound except right renal cyst. Electronically Signed   By: Margarette Canada M.D.   On: 01/08/2017 13:07    STUDIES:  Renal US 11/7 > right renal cyst.  SIGNIFICANT EVENTS  11/2 > admit. 11/5 > OR for left hip fx repair. 11/7 > nephrology consult. 11/8 >  PCCM consult.  ASSESSMENT / PLAN:  Hypotension - suspect due to hypovolemia.  Has only had 1L bolus throughout the day.  On exam, appears dry.  Had minimal blood loss post op, but hgb 7.1 on 11/8 (she received 1u PRBC).  Does not appear to have infectious component. Recent Labs    01/09/17 0449 01/10/17 0357  HGB 7.1* 8.1*    Plan: Additional 1L NS bolus 11/8. Goal SBP > 90. Could likely use further boluses, defer for now and see how responds to current bolus (has already  responded and SBP currently 100). Hold all anti hypertensives. Prior to d/c, needs med rec to see whether she actually needs antihypertensive regimen (agree that this could have reason for her fall if she had been hypotensive ever since starting regimen a few months ago). Monitor Hgb   AKI - worsening.  Presumed due to hypoperfusion.  Now with oliguria (bladder scan 11/8 with 25ml). Mild hyperkalemia - s/p temporizing measures. Hyponatremia - presumed hypovolemic. Lab Results  Component Value Date   CREATININE 6.53 (H) 01/10/2017   CREATININE 6.39 (H) 01/09/2017   CREATININE 6.11 (H) 01/09/2017   Recent Labs  Lab 01/09/17 0449 01/09/17 1545 01/10/17 0357  K 5.7* 5.5* 4.9   Recent Labs  Lab 01/09/17 0449 01/09/17 1545 01/10/17 0357  NA 133* 134* 136    Plan: Nephrology following. Family to have discussion regarding goals of care / code status - would pt even want dialysis, etc? If nephrology and family decide to initiate renal replacement, will move to ICU and might require low dose vasopressors at that point.  Currently SBP 100  A.fib. Plan: Continue heparin and transition to eliquis. F/u on echo. Pending as of 11/9  Anemia. Recent Labs    01/09/17 0449 01/10/17 0357  HGB 7.1* 8.1*   Plan: Transfuse for Hgb < 7.  Rest per primary team. PCCM will be available as needed.   Richardson Landry Minor ACNP Maryanna Shape PCCM Pager [phone] till 1 pm If no answer page [phone] 01/10/2017, 11:12 AM  STAFF NOTE: I, Merrie Roof, MD FACP have personally reviewed patient's available data, including medical history, events of note, physical examination and test results as part of my evaluation. I have discussed with resident/NP and other care providers such as pharmacist, RN and RRT. In addition, I personally evaluated patient and elicited key findings of: awake, talking, jvd low, abdo soft, lungs clear without crackles, pcxr I reviewed last did not show any pulm edema, her BP  has improved, and appears to have been hypovolemic, I reviewed renal US without hydro, would maintain pos balance, MAP is adequate for now, she likely could tolerate more volume if renal function would need further resus to resolve, would obtain pcxr if further fluid will be given to ensure no pulm edema present, I updated family in room and pt, will sign off, she is perfusing well now   Lavon Paganini. Titus Mould, MD, [place] Pgr: [facility] 01/10/2017 11:36 AM

## 2017-01-10 NOTE — Progress Notes (Signed)
Subjective: Patient is feeling much better this morning. Denies fatigue or sleepiness. Family members at bedside, happy about care provided so far.  Objective: Vital signs in last 24 hours: Temp:  [97.2 F (36.2 C)-97.9 F (36.6 C)] 97.6 F (36.4 C) (11/09 0830) Pulse Rate:  [25-93] 71 (11/09 0900) Resp:  [12-24] 23 (11/09 0900) BP: (76-109)/(35-71) 100/52 (11/09 0900) SpO2:  [87 %-100 %] 99 % (11/09 0900) Weight change:   Intake/Output from previous day: 11/08 0701 - 11/09 0700 In: 3255.2 [I.V.:1436.5; Blood:335.4; IV Piggyback:1483.3] Out: -  Intake/Output this shift: No intake/output data recorded.  Physical Exam: General: Vital signs reviewed and noted. Well-developed, well-nourished, in no acute distress; alert, appropriate and cooperative throughout examination.  Head: Normocephalic, atraumatic.  Eyes: PERRL, EOMI, No signs of anemia or jaundince.  Nose: Mucous membranes moist, not inflammed, nonerythematous.  Throat: Oropharynx nonerythematous, no exudate appreciated.   Neck: No deformities, masses, or tenderness noted.Supple, No carotid Bruits, no JVD.  Lungs:  Normal respiratory effort. Clear to auscultation BL without crackles or wheezes.  Heart: RRR. S1 and S2 normal without gallop, murmur, or rubs.  Abdomen:  BS normoactive. Soft, Nondistended, non-tender.  No masses or organomegaly.  Extremities: No pretibial edema.  Neurologic: A&O X3, CN II - XII are grossly intact. Motor strength is 5/5 in the all 4 extremities, Sensations intact to light touch, Cerebellar signs negative.  Skin: No visible rashes, scars.    Lab Results: Recent Labs    01/09/17 0449 01/10/17 0357  WBC 10.3 9.8  HGB 7.1* 8.1*  HCT 20.9* 24.0*  PLT 105* 96*   BMET:  Recent Labs    01/09/17 1545 01/10/17 0357  NA 134* 136  K 5.5* 4.9  CL 106 104  CO2 17* 21*  GLUCOSE 129* 136*  BUN 76* 78*  CREATININE 6.39* 6.53*  CALCIUM 7.5* 7.0*   No results for input(s): PTH in the last 72  hours. Iron Studies: No results for input(s): IRON, TIBC, TRANSFERRIN, FERRITIN in the last 72 hours.  Studies/Results: US Renal  Result Date: 01/08/2017 CLINICAL DATA:  83 year old female with acute kidney injury. Hypertension. EXAM: RENAL / URINARY TRACT ULTRASOUND COMPLETE COMPARISON:  None. FINDINGS: Right Kidney: Length: 10.3 cm. Echogenicity within normal limits. No solid mass or hydronephrosis visualized. A 3.9 cm cyst within the upper right kidney identified. Left Kidney: Length: 10.4 cm. Echogenicity within normal limits. No mass or hydronephrosis visualized. Bladder: Appears normal for degree of bladder distention. IMPRESSION: Unremarkable renal ultrasound except right renal cyst. Electronically Signed   By: Margarette Canada M.D.   On: 01/08/2017 13:07    Scheduled: . amiodarone  200 mg Oral Daily  . feeding supplement (ENSURE ENLIVE)  237 mL Oral BID BM  . pantoprazole  40 mg Oral BID  . polyethylene glycol  17 g Oral BID  . rosuvastatin  20 mg Oral QHS  . senna-docusate  1 tablet Oral BID  . Vitamin D (Ergocalciferol)  50,000 Units Oral Weekly    Assessment/Plan: Pt is a 83 y.o. yo female with a PMHX significant for CAD status post stent, CKD, hypertension, A. fib/flutter, anemia, thrombocytopenia  , was admitted to [facility] on 01/03/2017 after mechanical fall at home leading to left hip fracture requiring orthopedic surgery.  #Acute on chronic kidney disease, stable This morning creatinine is 6.53 up from 6.39 yesterday. Patient BP has improved overnight with IVF. Patient was transferred to SDU for closer monitor given sustained hypotension and shock picture that she developed in the past  24 hours. LFTs are stable. Worsening creatinine likely secondary to hypoperfusion, seem to be stabilizing now with IVF and improving BP. Electrolytes are within normal limits, and patient does not require bicarb at this point. Continue to hold all BP pressure meds. Monitor clinically, if hypotension is  sustained despite IVF, patient might require pressors. --Discontinue amlodipine and metoprolol --Continue with NS 100 cc/hr, no bicar --Monitor volume status and blood pressure --Follow-up on am kidney function labs  #Left hip fracture status post repair Managed by orthopedic surgery weight bearing as tolerated with PT and OT. --Continue pain control when necessary  #Atrial fibrillation, controlled Patient is currently on heparin bridge and was given 1 dose of Eliquis status post surgery. Continue management per pharmacy. Will eventually transition to Eliquis.  #Hypertension>>Hypotension, improving BP this morning 100/52, improving with IVF. Patient has felt better and kidney and liver function have shown some improvement. Will continue current therapy. Does not appear at this point that patient will require pressors. --Discontinue metoprolol and amlodipine --Will Continue to monitor BP   #Urinary retention, not improving Patient has received multiple bolus and as IVF running at a rate of 100 cc/hr but has minimal urine output. Prior bladder scan and renal US was reassuring for obstruction/ retention. Foley was attempted by nursing staff multiple unsuccessfully. Primary team consulted urology who wanted to wait. Kidney function may continue to improve and she might start making some urine. For now would recommend repeat bladder scan and if oliguria persist would recommend urology consult for foley placement. --Bladder scan (order placed) --Consider Urology consult for foley placement.  DVT PPX -per primary team, currently on heparin     LOS: 7 days   Virginia Casey 01/10/2017,11:14 AM

## 2017-01-10 NOTE — Progress Notes (Signed)
BP is 73/41 and MAP is 48. Text paged Dr Kennon Holter. Will continue to monitor.

## 2017-01-10 NOTE — Progress Notes (Signed)
Physical Therapy Treatment Patient Details Name: KRYSTAN NORTHROP MRN: [id number] DOB: 1924-03-25 Today's Date: 01/10/2017    History of Present Illness 83 y.o. female with a history of CAD status post stents, atrial fibrillation/flutter, essential hypertension, chronic kidney disease, anemia, thrombocytopenia.  She presented after a fall and was found to have a left hip hip fracture. Pt is now s/p intertrochantric nailing L hip on 11/5. Developed AKI on CKD 01/08/17 Transferred to SDU for hypotension on 01/09/17    PT Comments    Visitors present. Pt very fearful and anxious throughout session. Monitored BP supine-106/68mmHg; HOB elevated 115/34mmHg, Sitting EOB -89/8mmHg. Notified nurse. Pt sat EOB for ~5 min with max-A for trunk elevation due to posterior lean. Transferred Pt using stedy. Frequent cues for hand placement. Positioned pt in recliner for LE exercises. Pt very limited to exercises due to muscle weakness and pain. Post session BP positioned in recliner 147/70mmHg.  Continue to work on strengthening next session.   Follow Up Recommendations  SNF;Supervision/Assistance - 24 hour     Equipment Recommendations  None recommended by PT    Recommendations for Other Services       Precautions / Restrictions Precautions Precautions: Fall Restrictions Weight Bearing Restrictions: Yes LLE Weight Bearing: Weight bearing as tolerated    Mobility  Bed Mobility Overal bed mobility: Needs Assistance Bed Mobility: Supine to Sit     Supine to sit: +2 for physical assistance;+2 for safety/equipment;HOB elevated;Total assist     General bed mobility comments: Heavy max A +2 to sit at EOB. REquired assist for trunk elevation and for scooting hips to EOB. Used chuck pad to help pt scoot to EOB. VC for hand placement on bed to help assist. with bed mobility  Transfers Overall transfer level: Needs assistance   Transfers: Sit to/from Stand           General transfer comment: Heavy  max A+ 2 to stand with stedy. Pt very fearful and required cues for hand placement on stedy. Transferred from bed to recliner.   Ambulation/Gait             General Gait Details: Not safe to attempt as pt unable to stand.    Stairs            Wheelchair Mobility    Modified Rankin (Stroke Patients Only)       Balance Overall balance assessment: Needs assistance Sitting-balance support: Bilateral upper extremity supported;Feet supported Sitting balance-Leahy Scale: Poor Sitting balance - Comments: ModA to Max A for balance with BUE on bed to maintain balance; Reliant on BUE on stedy for balance. Postural control: Posterior lean;Right lateral lean Standing balance support: Bilateral upper extremity supported;During functional activity Standing balance-Leahy Scale: Zero Standing balance comment: unable to stand fully upright.                            Cognition Arousal/Alertness: Awake/alert Behavior During Therapy: Anxious(fearful)   Area of Impairment: Following commands                       Following Commands: Follows one step commands with increased time       General Comments: Very anxious with movement of LLE.      Exercises General Exercises - Lower Extremity Ankle Circles/Pumps: AROM;Both;15 reps Quad Sets: AROM;Both;5 reps Hip ABduction/ADduction: AAROM;Both;10 reps Straight Leg Raises: AAROM;5 reps;Both    General Comments  Pertinent Vitals/Pain Faces Pain Scale: Hurts little more Pain Location: L LE with movement  Pain Descriptors / Indicators: Grimacing;Guarding Pain Intervention(s): Monitored during session;Repositioned(pt declined ICE)    Home Living                      Prior Function            PT Goals (current goals can now be found in the care plan section) Acute Rehab PT Goals Patient Stated Goal: not discussed Progress towards PT goals: Progressing toward goals    Frequency    Min  3X/week      PT Plan Current plan remains appropriate    Co-evaluation              AM-PAC PT "6 Clicks" Daily Activity  Outcome Measure  Difficulty turning over in bed (including adjusting bedclothes, sheets and blankets)?: Unable Difficulty moving from lying on back to sitting on the side of the bed? : Unable Difficulty sitting down on and standing up from a chair with arms (e.g., wheelchair, bedside commode, etc,.)?: Unable Help needed moving to and from a bed to chair (including a wheelchair)?: Total Help needed walking in hospital room?: Total Help needed climbing 3-5 steps with a railing? : Total 6 Click Score: 6    End of Session Equipment Utilized During Treatment: Gait belt Activity Tolerance: Patient limited by fatigue;Other (comment)(patient limited due to fear) Patient left: in chair;with call bell/phone within reach;with family/visitor present Nurse Communication: Mobility status PT Visit Diagnosis: Pain;Difficulty in walking, not elsewhere classified (R26.2);History of falling (Z91.81) Pain - Right/Left: Left Pain - part of body: Leg     Time: 1610-9604 PT Time Calculation (min) (ACUTE ONLY): 35 min  Charges:  $Therapeutic Exercise: 8-22 mins $Therapeutic Activity: 8-22 mins                    G Codes:  Functional Assessment Tool Used: AM-PAC 6 Clicks Basic Mobility    Fransisca Connors, SPTA    Fransisca Connors 01/10/2017, 5:17 PM

## 2017-01-11 DIAGNOSIS — Z515 Encounter for palliative care: Secondary | ICD-10-CM

## 2017-01-11 DIAGNOSIS — R34 Anuria and oliguria: Secondary | ICD-10-CM

## 2017-01-11 LAB — COMPREHENSIVE METABOLIC PANEL
ALK PHOS: 180 U/L — AB (ref 38–126)
ALT: 24 U/L (ref 14–54)
AST: 587 U/L — AB (ref 15–41)
Albumin: 2 g/dL — ABNORMAL LOW (ref 3.5–5.0)
Anion gap: 11 (ref 5–15)
BILIRUBIN TOTAL: 1.2 mg/dL (ref 0.3–1.2)
BUN: 78 mg/dL — AB (ref 6–20)
CHLORIDE: 106 mmol/L (ref 101–111)
CO2: 19 mmol/L — ABNORMAL LOW (ref 22–32)
CREATININE: 6.9 mg/dL — AB (ref 0.44–1.00)
Calcium: 7 mg/dL — ABNORMAL LOW (ref 8.9–10.3)
GFR calc Af Amer: 5 mL/min — ABNORMAL LOW (ref >60)
GFR, EST NON AFRICAN AMERICAN: 5 mL/min — AB (ref >60)
Glucose, Bld: 82 mg/dL (ref 65–99)
Potassium: 5 mmol/L (ref 3.5–5.1)
Sodium: 136 mmol/L (ref 135–145)
Total Protein: 4.4 g/dL — ABNORMAL LOW (ref 6.5–8.1)

## 2017-01-11 LAB — CBC
HEMATOCRIT: 24.1 % — AB (ref 36.0–46.0)
HEMOGLOBIN: 8.2 g/dL — AB (ref 12.0–15.0)
MCH: 24.8 pg — AB (ref 26.0–34.0)
MCHC: 34 g/dL (ref 30.0–36.0)
MCV: 72.8 fL — AB (ref 78.0–100.0)
Platelets: 108 10*3/uL — ABNORMAL LOW (ref 150–400)
RBC: 3.31 MIL/uL — ABNORMAL LOW (ref 3.87–5.11)
RDW: 25.1 % — ABNORMAL HIGH (ref 11.5–15.5)
WBC: 10.5 10*3/uL (ref 4.0–10.5)

## 2017-01-11 LAB — APTT: APTT: 91 s — AB (ref 24–36)

## 2017-01-11 LAB — HEPARIN LEVEL (UNFRACTIONATED): Heparin Unfractionated: 0.94 IU/mL — ABNORMAL HIGH (ref 0.30–0.70)

## 2017-01-11 MED ORDER — NYSTATIN 100000 UNIT/ML MT SUSP
5.0000 mL | Freq: Four times a day (QID) | OROMUCOSAL | Status: DC
  Administered 2017-01-11 (×2): 500000 [IU] via ORAL
  Filled 2017-01-11 (×3): qty 5

## 2017-01-11 MED ORDER — MIDODRINE HCL 5 MG PO TABS
10.0000 mg | ORAL_TABLET | Freq: Three times a day (TID) | ORAL | Status: DC
  Administered 2017-01-11 – 2017-01-13 (×7): 10 mg via ORAL
  Filled 2017-01-11 (×7): qty 2

## 2017-01-11 MED ORDER — MIDODRINE HCL 5 MG PO TABS: 10.0000 mg | ORAL_TABLET | Freq: Three times a day (TID) | ORAL | Status: DC

## 2017-01-11 MED ORDER — SODIUM CHLORIDE 0.9 % IV BOLUS (SEPSIS)
1000.0000 mL | Freq: Once | INTRAVENOUS | Status: AC
  Administered 2017-01-11: 1000 mL via INTRAVENOUS

## 2017-01-11 NOTE — Progress Notes (Signed)
Subjective: 5 Days Post-Op Procedure(s) (LRB): LEFT HIP  INTERTROCHANTRIC  NAIL (Left) Patient reports pain as mild to left hip.  Maintaining on Tylenol. Tolerating PO's. Progress with PT. Denies SOb,CP,or calf pain.  Objective: Vital signs in last 24 hours: Temp:  [97.4 F (36.3 C)-98.4 F (36.9 C)] 98.4 F (36.9 C) (11/10 0900) Pulse Rate:  [31-120] 111 (11/10 0900) Resp:  [13-28] 20 (11/10 0900) BP: (73-110)/(41-83) 91/56 (11/10 0900) SpO2:  [97 %-100 %] 100 % (11/10 0900)  Intake/Output from previous day: 11/09 0701 - 11/10 0700 In: 2650 [P.O.:50; I.V.:2350; IV Piggyback:250] Out: 0  Intake/Output this shift: Total I/O In: 441.8 [I.V.:441.8] Out: -   Recent Labs    01/09/17 0449 01/10/17 0357 01/11/17 0309  HGB 7.1* 8.1* 8.2*   Recent Labs    01/10/17 0357 01/11/17 0309  WBC 9.8 10.5  RBC 3.31* 3.31*  HCT 24.0* 24.1*  PLT 96* 108*   Recent Labs    01/10/17 0357 01/11/17 0309  NA 136 136  K 4.9 5.0  CL 104 106  CO2 21* 19*  BUN 78* 78*  CREATININE 6.53* 6.90*  GLUCOSE 136* 82  CALCIUM 7.0* 7.0*   No results for input(s): LABPT, INR in the last 72 hours.  Alert and oriented x3. RRR, Lungs clear, BS x4. Left Calf soft and non tender. L hip dressing C/D/I. No DVT signs. No signs of infection or compartment syndrome. LLE grossly neurovascularly intact.   Assessment/Plan: 5 Days Post-Op Procedure(s) (LRB): LEFT HIP  INTERTROCHANTRIC  NAIL (Left) WBAT LLE PT/OT Plan SNF at D/c SCDs Continue other care   Linh Johannes L 01/11/2017, 10:19 AM

## 2017-01-11 NOTE — Progress Notes (Signed)
Subjective: Patient is about the same this morning. No acute changes. Family at bedside and very attentive. Nephrlology team started the conversation about palliative care given declining kidney function, age and other comorbidities.  Objective: Vital signs in last 24 hours: Temp:  [97.4 F (36.3 C)-98.4 F (36.9 C)] 98.4 F (36.9 C) (11/10 0900) Pulse Rate:  [31-120] 111 (11/10 0900) Resp:  [13-28] 20 (11/10 0900) BP: (73-110)/(41-83) 91/56 (11/10 0900) SpO2:  [97 %-100 %] 100 % (11/10 0900) Weight change:   Intake/Output from previous day: 11/09 0701 - 11/10 0700 In: 2650 [P.O.:50; I.V.:2350; IV Piggyback:250] Out: 0  Intake/Output this shift: Total I/O In: 441.8 [I.V.:441.8] Out: -   Physical Exam: General: Vital signs reviewed and noted. Well-developed, well-nourished, in no acute distress; alert, appropriate and cooperative throughout examination.  Head: Normocephalic, atraumatic.  Eyes: PERRL, EOMI, No signs of anemia or jaundince.  Nose: Mucous membranes moist, not inflammed, nonerythematous.  Throat: Oropharynx nonerythematous, no exudate appreciated.   Neck: No deformities, masses, or tenderness noted.Supple, No carotid Bruits, no JVD.  Lungs:  Normal respiratory effort. Clear to auscultation BL without crackles or wheezes.  Heart: RRR. S1 and S2 normal without gallop, murmur, or rubs.  Abdomen:  BS normoactive. Soft, Nondistended, non-tender.  No masses or organomegaly.  Extremities: No pretibial edema.  Neurologic: A&O X3, CN II - XII are grossly intact. Motor strength is 5/5 in the all 4 extremities, Sensations intact to light touch, Cerebellar signs negative.  Skin: No visible rashes, scars.    Lab Results: Recent Labs    01/10/17 0357 01/11/17 0309  WBC 9.8 10.5  HGB 8.1* 8.2*  HCT 24.0* 24.1*  PLT 96* 108*   BMET:  Recent Labs    01/10/17 0357 01/11/17 0309  NA 136 136  K 4.9 5.0  CL 104 106  CO2 21* 19*  GLUCOSE 136* 82  BUN 78* 78*   CREATININE 6.53* 6.90*  CALCIUM 7.0* 7.0*   No results for input(s): PTH in the last 72 hours. Iron Studies: No results for input(s): IRON, TIBC, TRANSFERRIN, FERRITIN in the last 72 hours.  Studies/Results: No results found.  Scheduled: . amiodarone  200 mg Oral Daily  . feeding supplement (ENSURE ENLIVE)  237 mL Oral BID BM  . midodrine  10 mg Oral TID WC  . pantoprazole  40 mg Oral BID  . polyethylene glycol  17 g Oral BID  . rosuvastatin  20 mg Oral QHS  . senna-docusate  1 tablet Oral BID  . Vitamin D (Ergocalciferol)  50,000 Units Oral Weekly    Assessment/Plan: Pt is a 83 y.o. yo female with a PMHX significant for CAD status post stent, CKD, hypertension, A. fib/flutter, anemia, thrombocytopenia  , was admitted to [facility] on 01/03/2017 after mechanical fall at home leading to left hip fracture requiring orthopedic surgery.  #Acute on chronic kidney disease, stable This morning creatinine is 6.90 up from 6.53 and appears to be stabilizing. Patient continued to be hypotensive yesterday evening requiring multiple fluid bolus. CCM was also consulted and recommended to continue with IVF with possible transfer to ICU for pressors if dialysis was considered and patient continued to be hypotensive. Patient continue to be anuric. Electrolytes have been stable and patient has not exhibited any signs of uremia. Hemoglobin is stable. ECHO showed en EF of 65-70% with G2DD. Bladder scan showed a volume of 48 ml. Urology was consulted for foley placement, but given structural damage, would hold off for now. Given continue hypotension despite  aggressive resuscitation, will start patient on midodrine today and continue to monitor for any improvement in kidney function. Conversation about palliative care has been started with family members. Nephrology would still considered possibly short term dialysis if needed but patient would not be a good candidate for HD going forward. --Start  midodrine 10 mg  tid --Discontinue amlodipine and metoprolol --Continue with NS 125 cc/hr, bolus as needed --Monitor volume status and blood pressure --Follow-up on am kidney function labs  #Left hip fracture status post repair Managed by orthopedic surgery weight bearing as tolerated with PT and OT. --Continue pain control when necessary  #Atrial fibrillation, controlled Patient is currently on heparin bridge and was given 1 dose of Eliquis status post surgery. Continue management per pharmacy. Will eventually transition to Eliquis.  #Hypertension>>Hypotension, improving BP this morning 91/56. Patient continue to be hypotensive requiring multiple bolus and aggressive fluid resuscitation. CCM recommended continuing with current treatment plan with possible transfer to ICU if pressors were needed for HD. Will start patient on midodrine today and continue to monitor. --Discontinue metoprolol and amlodipine --Will Continue to monitor BP   #Urinary retention, not improving Patient had bladder scan yesterday which show 48 ml. Patient is positive 8L since admission and has been anuric since admission. Foley placement was attempted unsuccessfully on multiple occasions since admission. Urology was consulted and decision wa made to hold off on foley placement given anatomical defect in her GU. Renal US was unremarkable. --Will continue to monitor and discuss with primary team best approach.  DVT PPX -per primary team, currently on heparin     LOS: 8 days   Virginia Casey 01/11/2017,10:02 AM

## 2017-01-11 NOTE — Progress Notes (Signed)
ANTICOAGULATION CONSULT NOTE - Follow-Up Consult  Pharmacy Consult:  Heparin Indication: atrial fibrillation  No Known Allergies  Patient Measurements: Height: 5' 2.01" (157.5 cm) Weight: 155 lb 0 oz (70.3 kg) IBW/kg (Calculated) : 50.12 Heparin Dosing Weight: 65 kg  Assessment: 89 YOF with history of Afib on Eliquis PTA.  Patient was transitioned to IV heparin for surgery on 01/06/17, then transitioned back to Eliquis and received one dose before transitioning back to heparin bridge.  She has AKI. Currently using aPTT to guide heparin dosing because Eliquis can influence heparin levels.  Noted plans to establish goals of care.  PTT at goal today.  No overt bleeding or complications noted.  Goal of Therapy:  Heparin level 0.3-0.7 units/ml aPTT 66 - 102 seconds Monitor platelets by anticoagulation protocol: Yes   Plan:  Continue heparin drip at 550 units/hr Daily CBC/HL/aPTT Restart Eliquis eventually?  Uvaldo Rising, BCPS  Clinical Pharmacist Pager [phone]  01/11/2017 8:49 AM

## 2017-01-11 NOTE — Consult Note (Signed)
                                                                                 Consultation Note Date: 01/11/2017   Patient Name: Virginia Casey  DOB: 11/10/1924  MRN: [id number]  Age / Sex: 83 y.o., female  PCP: Nadel, Scott M, MD Referring Physician: Sheikh, Omair Latif, DO  Reason for Consultation: Establishing goals of care and Psychosocial/spiritual support  HPI/Patient Profile: 83 y.o. female  with past medical history of coronary artery disease status post stenting, atrial fib, hypertension, chronic kidney disease stage III, thrombocytopenia, anemia admitted on 01/03/2017 with left hip fracture secondary to a fall.  Patient underwent surgery for left hip fracture on 01/06/2017.  Postop she began to develop acute kidney injury superimposed on chronic kidney disease on 01/08/2017.  She was transferred to the stepdown unit for closer monitoring because of hypotension on 01/09/2017.  Despite fluid boluses her creatinine has continued to trend up and blood pressure has remained soft.  Her creatinine as of 01/11/2017 at 6.9, potassium 5.0 her blood pressures have ranged from 73-101 systolic 41-56 diastolic..   Clinical Assessment and Goals of Care: Met with patient, reviewed chart, met with multiple family members including granddaughter, Gresha Williams, who is patient's healthcare power of attorney.  She is surrounded by a very supportive living family including great grandchildren are at her bedside.  This is an unfortunate situation where patient comes in secondary to a hip fracture and is now finding herself in multisystem compromise with acute kidney injury not responding to fluids.  A lot of our meeting focused on clinical data, review of treatment options, specifically aggressive trajectory versus comfort, symptom focus trajectory. In terms of aggressive path, we talked about temporary CRRT, the need to move to ICU, other aggressive interventions such as pressors as well as CODE STATUS.   Terms of full code and DO NOT RESUSCITATE are defined.  I did candidly share that patient is very fragile, but I am concerned about her and that despite all these interventions she may be nearing end of life.  Multiple family members in the room have had experience with other loved ones being intubated, on hemodialysis, and the burden of these interventions. Mrs. Apgar directly states that she would not want dialysis on an ongoing basis.  She also is able to articulate that she does not want to be undergo CPR, defibrillation or to be on a ventilator.  Family is struggling whether to see if extra time, CRRT, short-term pressors, if this would be advantageous to seeing if she could recover  Patient can participate in healthcare decision making to a degree at this point.  Her granddaughter, Gresha Williams is her healthcare proxy.  This is a very large loving family and they are making decisions collectively as a family with Mrs. Plancarte as the focus    SUMMARY OF RECOMMENDATIONS   DNR/DNI Plan to meet in am, 0900 with Dr. Webb to discuss further CRRT, more aggressive trajectory  Code Status/Advance Care Planning:  DNR    Symptom Management:   Pain: Morphine would be contraindicated in a patient with acute kidney injury.  Would recommend changing to   oxycodone 2.5-5 mg every 4 hours as needed;  fentanyl IV would also be another alternative with less impact on her blood pressure, shorter acting.  It should be noted that patient is very resistant to opioids as is most of her family and is relying on Tylenol at this time  Palliative Prophylaxis:   Aspiration, Bowel Regimen, Delirium Protocol, Eye Care, Frequent Pain Assessment, Oral Care and Turn Reposition  Additional Recommendations (Limitations, Scope, Preferences):  Full Scope Treatment except for DNR/DNI  Psycho-social/Spiritual:   Desire for further Chaplaincy support:no  Additional Recommendations: Grief/Bereavement  Support  Prognosis:   Unable to determine  Discharge Planning: To Be Determined      Primary Diagnoses: Present on Admission: . Closed left hip fracture (HCC) . Essential hypertension . ATRIAL FLUTTER, PAROXYSMAL . Asthmatic bronchitis . Peripheral vascular disease (HCC) . Hip fracture (HCC) . Anemia   I have reviewed the medical record, interviewed the patient and family, and examined the patient. The following aspects are pertinent.  Past Medical History:  Diagnosis Date  . Anemia   . Anxiety   . Atherosclerotic heart disease   . Atrial flutter, paroxysmal (HCC)   . Colon polyp   . Diverticulosis of colon   . DJD (degenerative joint disease)    "legs" (10/06/2012)  . Exertional shortness of breath   . GERD (gastroesophageal reflux disease)   . Hiatal hernia   . History of blood transfusion ? 2010  . HTN (hypertension)   . Hypercholesterolemia   . Myocardial infarction (HCC) 2010  . Pneumonia    "once; years ago" (10/06/2012)  . PVD (peripheral vascular disease) (HCC)   . Vitamin D deficiency    Social History   Socioeconomic History  . Marital status: Widowed    Spouse name: None  . Number of children: 3  . Years of education: None  . Highest education level: None  Social Needs  . Financial resource strain: None  . Food insecurity - worry: None  . Food insecurity - inability: None  . Transportation needs - medical: None  . Transportation needs - non-medical: None  Occupational History  . Occupation: retired  Tobacco Use  . Smoking status: Never Smoker  . Smokeless tobacco: Never Used  Substance and Sexual Activity  . Alcohol use: No    Alcohol/week: 0.0 oz  . Drug use: No  . Sexual activity: No    Birth control/protection: Post-menopausal  Other Topics Concern  . None  Social History Narrative   Lives with granddaughter   3 children, 1 has passed   No caffeine         Family History  Problem Relation Age of Onset  . Heart disease Mother    . Rheum arthritis Mother   . Heart disease Maternal Grandmother   . Heart disease Maternal Grandfather   . Rheum arthritis Father   . Lung cancer Father   . Stroke Maternal Aunt   . Colon cancer Neg Hx   . Stomach cancer Neg Hx   . Rectal cancer Neg Hx   . Esophageal cancer Neg Hx   . Liver cancer Neg Hx    Scheduled Meds: . amiodarone  200 mg Oral Daily  . feeding supplement (ENSURE ENLIVE)  237 mL Oral BID BM  . midodrine  10 mg Oral TID WC  . nystatin  5 mL Oral QID  . pantoprazole  40 mg Oral BID  . polyethylene glycol  17 g Oral BID  . rosuvastatin  20   mg Oral QHS  . senna-docusate  1 tablet Oral BID  . Vitamin D (Ergocalciferol)  50,000 Units Oral Weekly   Continuous Infusions: . sodium chloride 125 mL/hr at 01/11/17 1200  . heparin 550 Units/hr (01/11/17 1200)  . sodium chloride     PRN Meds:.acetaminophen **OR** acetaminophen, ALPRAZolam, bisacodyl, HYDROcodone-acetaminophen, metoCLOPramide **OR** metoCLOPramide (REGLAN) injection, morphine injection, nitroGLYCERIN, ondansetron **OR** ondansetron (ZOFRAN) IV Medications Prior to Admission:  Prior to Admission medications   Medication Sig Start Date End Date Taking? Authorizing Provider  acetaminophen (TYLENOL) 325 MG tablet Take 325 mg by mouth every 6 (six) hours as needed. For pain   Yes [provider]  ALPRAZolam Duanne Moron) 0.25 MG tablet take 1 tablet by mouth three times a day if needed 12/27/16  Yes Noralee Space, MD  amiodarone (PACERONE) 200 MG tablet Take 200 mg by mouth daily.    Yes [provider]  amLODipine (NORVASC) 2.5 MG tablet Take 2.5 mg by mouth daily. 12/28/16  Yes [provider]  apixaban (ELIQUIS) 2.5 MG TABS tablet Take 2.5 mg by mouth 2 (two) times daily.   Yes [provider]  losartan (COZAAR) 100 MG tablet Take 1 tablet (100 mg total) by mouth daily. 07/18/15  Yes Noralee Space, MD  metoprolol succinate (TOPROL-XL) 50 MG 24 hr tablet Take 50 mg by mouth  daily. Take with or immediately following a meal.   Yes [provider]  nitroGLYCERIN (NITROSTAT) 0.4 MG SL tablet Place 0.4 mg under the tongue every 5 (five) minutes as needed. May repeat x3    Yes [provider]  pantoprazole (PROTONIX) 40 MG tablet take 1 tablet by mouth twice a day 05/01/16  Yes Noralee Space, MD  rosuvastatin (CRESTOR) 20 MG tablet Take 20 mg by mouth at bedtime.    Yes [provider]  Vitamin D, Ergocalciferol, (DRISDOL) 50000 units CAPS capsule take 1 capsule by mouth every week 08/09/16  Yes Noralee Space, MD  dicyclomine (BENTYL) 20 MG tablet Take 1/2-1 tablet by mouth up to 4 times a day as needed for abdominal cramping Patient not taking: Reported on 01/03/2017 07/27/14   Noralee Space, MD  ferrous sulfate (IRON SUPPLEMENT) 325 (65 FE) MG tablet Take 1 tablet (325 mg total) by mouth daily with breakfast. Patient not taking: Reported on 01/03/2017 07/23/16   Willia Craze, NP  HYDROcodone-acetaminophen (NORCO/VICODIN) 5-325 MG tablet Take 1-2 tablets every 6 (six) hours as needed by mouth for moderate pain. 01/06/17   Nicholes Stairs, MD   No Known Allergies Review of Systems  Unable to perform ROS: Acuity of condition    Physical Exam  Constitutional: She is oriented to person, place, and time. She appears well-developed and well-nourished.  HENT:  Head: Normocephalic and atraumatic.  Cardiovascular:  Hypotensive Generalized edema  Pulmonary/Chest: Effort normal.  Neurological: She is alert and oriented to person, place, and time.  Skin: Skin is warm and dry.  Psychiatric:  Short-term memory deficits noted Patient feels overwhelmed by the change in her clinical condition  Nursing note and vitals reviewed.   Vital Signs: BP 98/67 (BP Location: Left Arm)   Pulse 82   Temp 98.3 F (36.8 C) (Oral)   Resp 20   Ht 5' 2.01" (1.575 m)   Wt 70.3 kg (155 lb 0 oz)   SpO2 100%   BMI 28.34 kg/m  Pain Assessment: 0-10 POSS  *See Group Information*: 1-Acceptable,Awake and alert Pain Score: 3  SpO2: SpO2: 100 % O2 Device:SpO2: 100 % O2 Flow Rate: .O2 Flow Rate (L/min): 2 L/min  IO: Intake/output summary:   Intake/Output Summary (Last 24 hours) at 01/11/2017 1615 Last data filed at 01/11/2017 1300 Gross per 24 hour  Intake 3821.08 ml  Output -  Net 3821.08 ml    LBM: Last BM Date: 01/03/17 Baseline Weight: Weight: 70.3 kg (155 lb) Most recent weight: Weight: 70.3 kg (155 lb 0 oz)     Palliative Assessment/Data:   Flowsheet Rows     Most Recent Value  Intake Tab  Referral Department  Nephrology  Unit at Time of Referral  ICU  Palliative Care Primary Diagnosis  Nephrology  Date Notified  01/11/17  Palliative Care Type  New Palliative care  Reason for referral  Clarify Goals of Care, Psychosocial or Spiritual support  Date of Admission  01/03/17  Date first seen by Palliative Care  01/11/17  # of days Palliative referral response time  0 Day(s)  # of days IP prior to Palliative referral  8  Clinical Assessment  Palliative Performance Scale Score  40%  Pain Max last 24 hours  Not able to report  Pain Min Last 24 hours  Not able to report  Dyspnea Max Last 24 Hours  Not able to report  Dyspnea Min Last 24 hours  Not able to report  Nausea Max Last 24 Hours  Not able to report  Nausea Min Last 24 Hours  Not able to report  Anxiety Max Last 24 Hours  Not able to report  Anxiety Min Last 24 Hours  Not able to report  Other Max Last 24 Hours  Not able to report  Psychosocial & Spiritual Assessment  Palliative Care Outcomes  Patient/Family meeting held?  Yes  Who was at the meeting?  55 family memebers including granddaughter, [name], [place]  Patient/Family wishes: Interventions discontinued/not started   Mechanical Ventilation, Hemodialysis  Palliative Care follow-up planned  Yes, Facility      Time In: 1500 Time Out: 1620 Time Total: 80 min Greater than 50%  of this time was  spent counseling and coordinating care related to the above assessment and plan. Staffed with Dr. Justin Mend with nephrology and Dr. Chana Bode, attending  Signed by: Dory Horn, NP   Please contact Palliative Medicine Team phone at [phone] for questions and concerns.  For individual provider: See Shea Evans

## 2017-01-11 NOTE — Progress Notes (Signed)
PROGRESS NOTE    [name]  YOV:[id number] DOB: [birth year] DOA: 01/03/2017 PCP: Noralee Space, MD   Brief Narrative:  Virginia Casey is a 83 y.o. female with a history of CAD status post stents, atrial fibrillation/flutter, essential hypertension, chronic kidney disease, anemia, thrombocytopenia as well as other comorbids who presented after a fall and was found to have a left hip fracture. Orthopedic surgery was consulted and is recommended surgery on Monday, November 5 secondary to chronic anticoagulation use. Patient is s/p intramedullary implant on 11/5. Cr worsened so she was worked up and Nephrology and Urology were consulted. Worsening AKI on CKD likely from Hypotension so Anti-hypertensives discontinued and patient getting IVF Rehydrated. Cr worsened and patient remains Hypotensive so she was transferred to SDU for closer monitor, rehydrated and Cortisol, Troponin, and ECHOCardiogram being checked.  Patient continues to by intermittently hypotensive and has been getting more fluid boluses. Has not made very much urine and concern of volume overloading patient has her Albumin is 2.0. Discussed with Nephrology Dr. Justin Mend who recommended Palliative Care Consultation. Palliative Care met with the family and patient made DNR. Plan is for family to find out more about CRRT in the AM after discussion with Dr. Justin Mend.   Assessment & Plan:   Active Problems:   Anemia   Essential hypertension   ATRIAL FLUTTER, PAROXYSMAL   Peripheral vascular disease (HCC)   Asthmatic bronchitis   Closed left hip fracture (HCC)   Hip fracture (HCC)   Elevated troponin   Abnormal LFTs   Pressure injury of skin   Atrial fibrillation (HCC)   Anticoagulant long-term use   Surgery, elective   AKI (acute kidney injury) ([place])   Palliative care by specialist  Left hip fracture s/p Intramedullary Implant  -S/p left subtrochanteric intramedullary implant on 01/06/17 -Appreciate Orthopedic surgery  recommendations: -Morphine changed to po Oxycodone 2.5-5 mg po q4hprn  -C/w PT/OT; Recommending SNF if stable   Atrial Flutter -C/w Amiodarone 200 mg po Daily and Metoprolol stopped by Nephro -Holding Eliquis for now given Renal Fxn  -C/w Heparin gtt currently  -Checked ECHOCardiogram as below   Essential Hypertension >> Persistent Hypotension  -Discontinued Amlodipine and Metoprolol -Discontinued Hydralazine IV prn  -C/w IVF Rehydration; Give IVF Boluses  -Discussed with PCCM and appreciated their evaluation; Given IVF and Map is Adequate for now but states to maintain positive Balance  -Checked Troponin and stable at 0.07 , Cortisol Level normal, and ECHOCardiogram as below -Nephrology adding Midodrine; Patient continues to have persistent hypotension -Continue to Monitor Volume Status; Patient is +10.843 Liters -Palliative Care Consulted for Goals of Care Discussion -Pending Family meeting with Nephrology may need short term pressors and CRRT  ? Urinary retention, less likely Transient. Voiding. +1460 mL since admission -continue to watch UOP -strict in/out -bladder scan showed 13 mL's; repeat Bladder Scan showed 48 mls -Urology Attempted Foley Catheterization but unsuccessful and discussed with Dr. Alinda Money and will hold off for now -Nephrology rechecking Bladder Scan  Acute kidney injury on CKD 3, worsening  -No documented urine in/out; Cr worsened significantly likely in the setting of Hypoperfusion from Hypotension; Cr now 6.90 -Nephrology consulted and stopped Anti-hypertensives; Now starting Midodrine -Appreciate Additional Nephrology Recommendations;  -Renal U/S was unremarkable except a Right Renal Cyst -Nephrology started Bicarbonate gtt and switching to NS -Palliative Care Consulted for Goals of Care Discussion; Appreciate them meeting with the Family this afternoon -Patient states she does not want dialysis on an ongoing basis  -Plan is for  Family to meet in AM  with Nephrology to discuss CRRT and potential to move to the Intensive Care Unit if aggressive approach is taken   Hyperlipidemia -Continue Rosuvastatin 20 mg po qHS  Leukocytosis -Likely stress related due to Pain and Surgery. -Afebrile. No source for infection. Stable. -Continue to Monitor for S/Sx of Infection and repeat CBC in AM  Thrombocytopenia -Improving. Likely stress related. Platelet Count went from 96 -> 108 -Continue to trend CBC and Monitor for S/Sx of Bleeding   Hyperkalemia, improved -Given Kayexleate x1 -Bicarb Gtt changed to NS -K+ is now 5.0 -Repeat CMP in AM   Elevated Troponin -Troponin was 0.07 x3 and Flat -Likely in the setting of Hypotension and Worsening Renal Failure -Continue to Trend -Checked ECHOCardiogram as below   ABLA in the setting of AoCKD -Hb/Hct Trending down and trended down to 7.1/20.9 -Transfusion of 1 unit of pRBC's and Hb/Hct improved to 8.2/24.1 -Continue to Monitor for S/Sx of Bleeding -Repeat CBC in AM  Abnormal LFT's/Transaminitis -AST went to 750 -> 736 -> 587 and ALT 87 -> 41 -> 24 -Likely 2/2 to Hypoperfusion -C/w IVF Rehydration per Nephro -Repeat CMP in AM and if worsening consider obtaining RUQ U/S and LFT's  DVT prophylaxis: SCD's; Anticoagulated with Heparin gtt Code Status: DO NOT RESUSCITATE Family Communication: Discussed with some Family at bedside  Disposition Plan: SNF when medically stable vs. Potential comfort Care Discussion  Consultants:   Orthopedic Surgery  Nephrology  Urology  PCCM   Procedures:  Left Hip Fracture s/p Left Hip Intertrochanteric Nail   ECHOCARDIOGRAM ------------------------------------------------------------------- Study Conclusions  - Left ventricle: The cavity size was normal. Wall thickness was   normal. Systolic function was vigorous. The estimated ejection   fraction was in the range of 65% to 70%. Wall motion was normal;   there were no regional wall motion  abnormalities. - Mitral valve: Moderately calcified annulus. Moderately thickened,   moderately calcified leaflets . There was mild regurgitation. - Left atrium: The atrium was moderately dilated. - Tricuspid valve: There was mild-moderate regurgitation. - Pulmonary arteries: Systolic pressure was moderately increased.   PA peak pressure: 59 mm Hg (S).  Antimicrobials: Anti-infectives (From admission, onward)   Start     Dose/Rate Route Frequency Ordered Stop   01/07/17 0500  ceFAZolin (ANCEF) IVPB 1 g/50 mL premix     1 g 100 mL/hr over 30 Minutes Intravenous Every 12 hours 01/06/17 2007 01/07/17 1723   01/06/17 1715  ceFAZolin (ANCEF) IVPB 2g/100 mL premix     2 g 200 mL/hr over 30 Minutes Intravenous To ShortStay Surgical 01/06/17 1648 01/06/17 1730   01/06/17 1649  ceFAZolin (ANCEF) 2-4 GM/100ML-% IVPB    Comments:  Grace Blight   : cabinet override      01/06/17 1649 01/06/17 1730     Subjective: Seen and examined and complained of Right shoulder pain. Has not urinated. Denies CP or SOB. No lightheadedness or dizziness. Palliative Care was brought up and family meeting today.     Objective: Vitals:   01/11/17 0500 01/11/17 0900 01/11/17 1151 01/11/17 1545  BP: (!) 80/51 (!) 91/56 (!) 101/56 98/67  Pulse: 94 (!) 111 (!) 108 82  Resp: 13 20 (!) 23 20  Temp:  98.4 F (36.9 C) 97.9 F (36.6 C) 98.3 F (36.8 C)  TempSrc:  Oral Oral Oral  SpO2: 100% 100% 100% 100%  Weight:      Height:        Intake/Output Summary (Last 24  hours) at 01/11/2017 1855 Last data filed at 01/11/2017 1800 Gross per 24 hour  Intake 4463.08 ml  Output -  Net 4463.08 ml   Filed Weights   01/04/17 1300 01/06/17 1617  Weight: 70.3 kg (155 lb) 70.3 kg (155 lb 0 oz)   Examination: Physical Exam:  Constitutional: WN/WD AAF in NAD appears to be calm Eyes: Sclerae anicteric. Lids normal. ENMT: External Ears and nose appear normal Neck: Supple with no JVD Respiratory: Diminished to  auscultation. No appreciable wheezing/rales/rhonchi Cardiovascular: RRR; Patient had 1-2+ edema and legs appeared tight Abdomen: Soft, NT, ND. Bowel sounds present GU: Deferred Musculoskeletal: No contractures. No cyanosis Skin: Warm and dry. Left Hip Incisions appear C/D/I Neurologic: CN 2-12 grossly intact. No appreciable focal deficits Psychiatric: Normal and pleasant mood. Intact judgement and insight  Data Reviewed: I have personally reviewed following labs and imaging studies  CBC: Recent Labs  Lab 01/07/17 0716 01/08/17 0444 01/09/17 0449 01/10/17 0357 01/11/17 0309  WBC 11.1* 9.7 10.3 9.8 10.5  NEUTROABS  --   --  8.4* 7.9*  --   HGB 8.2* 7.3* 7.1* 8.1* 8.2*  HCT 25.1* 22.0* 20.9* 24.0* 24.1*  MCV 72.3* 71.2* 71.6* 72.5* 72.8*  PLT 84* 100* 105* 96* 176*   Basic Metabolic Panel: Recent Labs  Lab 01/08/17 0444 01/09/17 0449 01/09/17 1545 01/10/17 0357 01/11/17 0309  NA 134* 133* 134* 136 136  K 5.4* 5.7* 5.5* 4.9 5.0  CL 105 106 106 104 106  CO2 17* 17* 17* 21* 19*  GLUCOSE 131* 104* 129* 136* 82  BUN 66* 77* 76* 78* 78*  CREATININE 5.02* 6.11* 6.39* 6.53* 6.90*  CALCIUM 7.7* 7.5* 7.5* 7.0* 7.0*  MG  --  2.1  --  2.0  --   PHOS  --  6.4*  --  6.5*  --    GFR: Estimated Creatinine Clearance: 5.1 mL/min (A) (by C-G formula based on SCr of 6.9 mg/dL (H)). Liver Function Tests: Recent Labs  Lab 01/09/17 0449 01/10/17 0357 01/11/17 0309  AST 750* 736* 587*  ALT 87* 41 24  ALKPHOS 154* 170* 180*  BILITOT 1.0 1.1 1.2  PROT 5.4* 4.5* 4.4*  ALBUMIN 2.4* 2.1* 2.0*   No results for input(s): LIPASE, AMYLASE in the last 168 hours. No results for input(s): AMMONIA in the last 168 hours. Coagulation Profile: No results for input(s): INR, PROTIME in the last 168 hours. Cardiac Enzymes: Recent Labs  Lab 01/09/17 1033 01/09/17 1545 01/09/17 2101  TROPONINI 0.07* 0.07* 0.07*   BNP (last 3 results) No results for input(s): PROBNP in the last 8760  hours. HbA1C: No results for input(s): HGBA1C in the last 72 hours. CBG: No results for input(s): GLUCAP in the last 168 hours. Lipid Profile: No results for input(s): CHOL, HDL, LDLCALC, TRIG, CHOLHDL, LDLDIRECT in the last 72 hours. Thyroid Function Tests: No results for input(s): TSH, T4TOTAL, FREET4, T3FREE, THYROIDAB in the last 72 hours. Anemia Panel: No results for input(s): VITAMINB12, FOLATE, FERRITIN, TIBC, IRON, RETICCTPCT in the last 72 hours. Sepsis Labs: No results for input(s): PROCALCITON, LATICACIDVEN in the last 168 hours.  Recent Results (from the past 240 hour(s))  Surgical PCR screen     Status: None   Collection Time: 01/06/17  8:10 AM  Result Value Ref Range Status   MRSA, PCR NEGATIVE NEGATIVE Final   Staphylococcus aureus NEGATIVE NEGATIVE Final    Comment: (NOTE) The Xpert SA Assay (FDA approved for NASAL specimens in patients 30 years of age and  older), is one component of a comprehensive surveillance program. It is not intended to diagnose infection nor to guide or monitor treatment. Performed at [facility], [place] [address], [place], [place] [address]     Radiology Studies: No results found. Scheduled Meds: . amiodarone  200 mg Oral Daily  . feeding supplement (ENSURE ENLIVE)  237 mL Oral BID BM  . midodrine  10 mg Oral TID WC  . nystatin  5 mL Oral QID  . pantoprazole  40 mg Oral BID  . polyethylene glycol  17 g Oral BID  . rosuvastatin  20 mg Oral QHS  . senna-docusate  1 tablet Oral BID  . Vitamin D (Ergocalciferol)  50,000 Units Oral Weekly   Continuous Infusions: . sodium chloride 125 mL/hr at 01/11/17 1600  . heparin 550 Units/hr (01/11/17 1600)  . sodium chloride      LOS: 8 days   Kerney Elbe, DO [facility] Pager [phone]  If 7PM-7AM, please contact night-coverage [email or web address] Password [facility] 01/11/2017, 6:55 PM

## 2017-01-11 NOTE — Progress Notes (Signed)
CSW following for discharge plan and family support. CSW noting per previous CSW intervention that patient's daughter was given SNF bed offers but has not selected a facility yet at this time. Patient continues to not be medically stable for transition to SNF.   Per MD, palliative has been consulted; CSW will follow for goals of care and any potential changes to the disposition plan.  Laveda Abbe, [place] Clinical Social Worker [phone]

## 2017-01-11 NOTE — Progress Notes (Signed)
21:32 250 ml NS bolus given per MD order, see [facility]

## 2017-01-12 DIAGNOSIS — I480 Paroxysmal atrial fibrillation: Secondary | ICD-10-CM

## 2017-01-12 DIAGNOSIS — R945 Abnormal results of liver function studies: Secondary | ICD-10-CM

## 2017-01-12 LAB — MAGNESIUM: MAGNESIUM: 1.8 mg/dL (ref 1.7–2.4)

## 2017-01-12 LAB — COMPREHENSIVE METABOLIC PANEL
ALBUMIN: 2.1 g/dL — AB (ref 3.5–5.0)
ALK PHOS: 201 U/L — AB (ref 38–126)
ALT: 19 U/L (ref 14–54)
AST: 577 U/L — AB (ref 15–41)
Anion gap: 11 (ref 5–15)
BUN: 79 mg/dL — AB (ref 6–20)
CO2: 19 mmol/L — AB (ref 22–32)
CREATININE: 7.2 mg/dL — AB (ref 0.44–1.00)
Calcium: 7.3 mg/dL — ABNORMAL LOW (ref 8.9–10.3)
Chloride: 107 mmol/L (ref 101–111)
GFR calc non Af Amer: 4 mL/min — ABNORMAL LOW (ref >60)
GFR, EST AFRICAN AMERICAN: 5 mL/min — AB (ref >60)
Glucose, Bld: 91 mg/dL (ref 65–99)
Potassium: 5.5 mmol/L — ABNORMAL HIGH (ref 3.5–5.1)
SODIUM: 137 mmol/L (ref 135–145)
TOTAL PROTEIN: 4.8 g/dL — AB (ref 6.5–8.1)
Total Bilirubin: 1.2 mg/dL (ref 0.3–1.2)

## 2017-01-12 LAB — CBC WITH DIFFERENTIAL/PLATELET
BASOS ABS: 0 10*3/uL (ref 0.0–0.1)
Basophils Relative: 0 %
EOS ABS: 0 10*3/uL (ref 0.0–0.7)
Eosinophils Relative: 0 %
HCT: 25.3 % — ABNORMAL LOW (ref 36.0–46.0)
HEMOGLOBIN: 8.5 g/dL — AB (ref 12.0–15.0)
LYMPHS ABS: 1.2 10*3/uL (ref 0.7–4.0)
Lymphocytes Relative: 9 %
MCH: 24.5 pg — AB (ref 26.0–34.0)
MCHC: 33.6 g/dL (ref 30.0–36.0)
MCV: 72.9 fL — ABNORMAL LOW (ref 78.0–100.0)
MONO ABS: 1.3 10*3/uL — AB (ref 0.1–1.0)
Monocytes Relative: 10 %
NEUTROS ABS: 10.4 10*3/uL — AB (ref 1.7–7.7)
Neutrophils Relative %: 81 %
PLATELETS: 125 10*3/uL — AB (ref 150–400)
RBC: 3.47 MIL/uL — AB (ref 3.87–5.11)
RDW: 25.6 % — ABNORMAL HIGH (ref 11.5–15.5)
WBC: 12.9 10*3/uL — AB (ref 4.0–10.5)

## 2017-01-12 LAB — PHOSPHORUS: PHOSPHORUS: 7.3 mg/dL — AB (ref 2.5–4.6)

## 2017-01-12 LAB — HEPARIN LEVEL (UNFRACTIONATED)
Heparin Unfractionated: 0.94 IU/mL — ABNORMAL HIGH (ref 0.30–0.70)
Heparin Unfractionated: 0.72 IU/mL — ABNORMAL HIGH (ref 0.30–0.70)

## 2017-01-12 LAB — APTT
APTT: 126 s — AB (ref 24–36)
aPTT: 85 seconds — ABNORMAL HIGH (ref 24–36)

## 2017-01-12 MED ORDER — STERILE WATER FOR INJECTION IV SOLN
150.0000 meq | INTRAVENOUS | Status: DC
  Administered 2017-01-12: 150 meq via INTRAVENOUS
  Filled 2017-01-12 (×4): qty 850

## 2017-01-12 MED ORDER — LORAZEPAM 2 MG/ML IJ SOLN: 0.5000 mg | INTRAMUSCULAR | Status: DC | PRN

## 2017-01-12 NOTE — Clinical Social Work Note (Signed)
CSW received a phone call from bedside nurse who said family has met with palliative and have decided they would like to home with hospice.  CSW contacted weekend care manager to let her know.  CSW to sign off please reconsult if other social work needs arise.  Schunk Broom. Virginia Casey, MSW, [place]  01/12/2017 11:01 AM

## 2017-01-12 NOTE — Progress Notes (Signed)
Orthopedics Progress Note  Subjective: Patient comfortable as long as she doesn't move the leg too much.  Objective:  Vitals:   01/12/17 0400 01/12/17 0734  BP: 106/62 114/63  Pulse: (!) 101 (!) 101  Resp: 10 15  Temp: (!) 97.2 F (36.2 C) (!) 97.4 F (36.3 C)  SpO2: 100% 100%    General: Awake and alert  Musculoskeletal: Left hip dressing changed, incisions clean and dry with some serous drainage from edema. No erythema, no real local swelling. Compartments supple. Neurovascularly intact  Lab Results  Component Value Date   WBC 12.9 (H) 01/12/2017   HGB 8.5 (L) 01/12/2017   HCT 25.3 (L) 01/12/2017   MCV 72.9 (L) 01/12/2017   PLT 125 (L) 01/12/2017       Component Value Date/Time   NA 137 01/12/2017 0312   K 5.5 (H) 01/12/2017 0312   CL 107 01/12/2017 0312   CO2 19 (L) 01/12/2017 0312   GLUCOSE 91 01/12/2017 0312   BUN 79 (H) 01/12/2017 0312   CREATININE 7.20 (H) 01/12/2017 0312   CALCIUM 7.3 (L) 01/12/2017 0312   GFRNONAA 4 (L) 01/12/2017 0312   GFRAA 5 (L) 01/12/2017 0312    Lab Results  Component Value Date   INR 1.15 04/15/2016   INR 1.09 10/02/2009   INR 1.09 09/29/2009    Assessment/Plan: s/p Procedure(s): LEFT HIP  INTERTROCHANTRIC  NAIL Patient remains stable from an ortho standpoint but dealing with numerous serious medical issues. Hospice on board. We will continue to follow and are available should questions arise. Turn patient/decubitus precautions Comfort care.  Doran Heater. Veverly Fells, MD 01/12/2017 1:05 PM

## 2017-01-12 NOTE — Progress Notes (Signed)
Daily Progress Note   Patient Name: SHAWANDA SIEVERT       Date: 01/12/2017 DOB: [birth year]  Age: 83 y.o. MRN#: [id number] Attending Physician: Kerney Elbe, DO Primary Care Physician: Noralee Space, MD Admit Date: 01/03/2017  Reason for Consultation/Follow-up: Establishing goals of care and Psychosocial/spiritual support  Subjective: Pt's creatinine worsening, now 7.2, K+5.5. Family meeting with nephrology, Dr. Justin Mend, and Dr. Alfredia Ferguson.  Discussed again with patient and family risks and benefits of more aggressive treatment such as CRRT, to see if her kidneys can rebound.  We did share candidly with patient and family that the likelihood of CRRT being successful, and that it restores her renal function, is unlikely.  Patient herself asked me "so you telling me I will not be like I was before?"  I did share with her  that I was afraid that this was end of life.   Patient still struggling with "had a breaking my hip lead to this?"  Later she states that if she is to be like this, she would like to go home  I did speak to her granddaughter [name] as well as her daughter Parks Neptune and other grandchildren in private regarding the care trajectory including discussion of [place] or hospice care in the home.  Family is leaning towards hospice care in the home at this point but this is not a firm decision today  Length of Stay: 9  Current Medications: Scheduled Meds:  . amiodarone  200 mg Oral Daily  . feeding supplement (ENSURE ENLIVE)  237 mL Oral BID BM  . midodrine  10 mg Oral TID WC  . nystatin  5 mL Oral QID  . pantoprazole  40 mg Oral BID  . polyethylene glycol  17 g Oral BID  . rosuvastatin  20 mg Oral QHS  . senna-docusate  1 tablet Oral BID  . Vitamin D (Ergocalciferol)  50,000 Units  Oral Weekly    Continuous Infusions: . sodium chloride 50 mL/hr at 01/11/17 2032  . heparin 400 Units/hr (01/12/17 0512)  . sodium chloride      PRN Meds: acetaminophen **OR** acetaminophen, ALPRAZolam, bisacodyl, HYDROcodone-acetaminophen, metoCLOPramide **OR** metoCLOPramide (REGLAN) injection, morphine injection, nitroGLYCERIN, ondansetron **OR** ondansetron (ZOFRAN) IV  Physical Exam  Constitutional: She is oriented to person, place, and time. She appears well-developed and well-nourished.  HENT:  Head: Normocephalic and atraumatic.  Cardiovascular: Normal rate.  Pulmonary/Chest: Effort normal.  Neurological: She is alert and oriented to person, place, and time.  Skin: Skin is warm and dry.  Psychiatric: She has a normal mood and affect. Her behavior is normal. Judgment and thought content normal.  Nursing note and vitals reviewed.           Vital Signs: BP 114/63 (BP Location: Left Arm)   Pulse (!) 101   Temp (!) 97.4 F (36.3 C) (Oral)   Resp 15   Ht 5' 2.01" (1.575 m)   Wt 70.3 kg (155 lb 0 oz)   SpO2 100%   BMI 28.34 kg/m  SpO2: SpO2: 100 % O2 Device: O2 Device: Nasal Cannula O2 Flow Rate: O2 Flow Rate (L/min): 2 L/min  Intake/output summary:   Intake/Output Summary (Last 24 hours) at 01/12/2017 0900 Last data filed at 01/12/2017 0400 Gross per 24 hour  Intake 2355.25 ml  Output -  Net 2355.25 ml   LBM: Last BM Date: 01/03/17 Baseline Weight: Weight: 70.3 kg (155 lb) Most recent weight: Weight: 70.3 kg (155 lb 0 oz)       Palliative Assessment/Data:    Flowsheet Rows     Most Recent Value  Intake Tab  Referral Department  Nephrology  Unit at Time of Referral  ICU  Palliative Care Primary Diagnosis  Nephrology  Date Notified  01/11/17  Palliative Care Type  New Palliative care  Reason for referral  Clarify Goals of Care, Psychosocial or Spiritual support  Date of Admission  01/03/17  Date first seen by Palliative Care  01/11/17  # of days  Palliative referral response time  0 Day(s)  # of days IP prior to Palliative referral  8  Clinical Assessment  Palliative Performance Scale Score  40%  Pain Max last 24 hours  Not able to report  Pain Min Last 24 hours  Not able to report  Dyspnea Max Last 24 Hours  Not able to report  Dyspnea Min Last 24 hours  Not able to report  Nausea Max Last 24 Hours  Not able to report  Nausea Min Last 24 Hours  Not able to report  Anxiety Max Last 24 Hours  Not able to report  Anxiety Min Last 24 Hours  Not able to report  Other Max Last 24 Hours  Not able to report  Psychosocial & Spiritual Assessment  Palliative Care Outcomes  Patient/Family meeting held?  Yes  Who was at the meeting?  21 family memebers including granddaughter, [name], [place]  Patient/Family wishes: Interventions discontinued/not started   Mechanical Ventilation, Hemodialysis  Palliative Care follow-up planned  Yes, Facility      Patient Active Problem List   Diagnosis Date Noted  . Palliative care by specialist   . Elevated troponin 01/09/2017  . Abnormal LFTs 01/09/2017  . Pressure injury of skin 01/09/2017  . Atrial fibrillation ([place])   . Anticoagulant long-term use   . Surgery, elective   . AKI (acute kidney injury) ([place])   . Closed left hip fracture ([place]) 01/03/2017  . Hip fracture ([place]) 01/03/2017  . Closed 2-part intertrochanteric fracture of proximal end of left femur ([place])   . Unstable angina ([place]) 04/13/2016  . Fall 05/12/2015  . IBS (irritable bowel syndrome) 07/30/2014  . Asthmatic bronchitis 07/16/2010  . Disorder of thyroid 04/18/2010  . Vitamin D deficiency 07/26/2008  . BACK PAIN, LUMBAR 07/26/2008  . ATRIAL FLUTTER, PAROXYSMAL 07/11/2008  .  Diverticulosis of large intestine 07/09/2007  . COLONIC POLYPS 03/16/2007  . HYPERCHOLESTEROLEMIA 03/16/2007  . Anemia 03/16/2007  . Anxiety state 03/16/2007  . Essential hypertension 03/16/2007  . Coronary atherosclerosis 03/16/2007  .  Peripheral vascular disease ([place]) 03/16/2007  . Diaphragmatic hernia 03/16/2007  . Osteoarthritis 03/16/2007    Palliative Care Assessment & Plan   Patient Profile: 83 y.o. female  with past medical history of coronary artery disease status post stenting, atrial fib, hypertension, chronic kidney disease stage III, thrombocytopenia, anemia admitted on 01/03/2017 with left hip fracture secondary to a fall.  Patient underwent surgery for left hip fracture on 01/06/2017.  Postop she began to develop acute kidney injury superimposed on chronic kidney disease on 01/08/2017.  She was transferred to the stepdown unit for closer monitoring because of hypotension on 01/09/2017.  Despite fluid boluses her creatinine has continued to trend up and blood pressure has remained soft.  Her creatinine as of 01/11/2017 at 6.9, potassium 5.0 her blood pressures have ranged from 25-956 systolic 38-75 diastolic..   Consult for [place] (Initial consult 11/10). Fu mtg this am with multidisciplinary team    Recommendations/Plan:  Continue current level of care over the next 24 hours as pt and family comes to terms with her clinical condition  Do not address hyperkalemia today.  No Kayexalate today  Palliative medicine to follow with patient and family on 01/13/2017 to discuss further next steps such as hospice at home.  I did share with family what hospice can offer as well as what they cannot offer such as IV fluids IV antibiotics and they verbalized understanding.  Again, this family does have personal experience with the burdens of aggressive medical care, ICU, hemodialysis as well as having family and friends that were cared for in their home by hospice.  They do verbalize having had a positive experience with hospice in the past  Goals of Care and Additional Recommendations:  Limitations on Scope of Treatment: Avoid Hospitalization, Minimize Medications, Initiate Comfort Feeding, No Artificial Feeding, No Blood  Transfusions, No Chemotherapy, No Hemodialysis, No Radiation, No Surgical Procedures and No Tracheostomy  Code Status:    Code Status Orders  (From admission, onward)        Start     Ordered   01/11/17 1604  Do not attempt resuscitation (DNR)  Continuous    Question Answer Comment  In the event of cardiac or respiratory ARREST Do not call a "code blue"   In the event of cardiac or respiratory ARREST Do not perform Intubation, CPR, defibrillation or ACLS   In the event of cardiac or respiratory ARREST Use medication by any route, position, wound care, and other measures to relive pain and suffering. May use oxygen, suction and manual treatment of airway obstruction as needed for comfort.      01/11/17 1603    Code Status History    Date Active Date Inactive Code Status Order ID Comments User Context   01/03/2017 19:31 01/11/2017 16:03 Full Code [id number]  Rise Patience, MD ED   01/03/2017 19:14 01/03/2017 19:31 Full Code [id number]  Rise Patience, MD ED   04/13/2016 12:51 04/15/2016 21:56 Full Code [id number]  Charolette Forward, MD ED    Advance Directive Documentation     Most Recent Value  Type of Advance Directive  Healthcare Power of Attorney  Pre-existing out of facility DNR order (yellow form or pink MOST form)  No data  "MOST" Form in Place?  No data  Prognosis:  If things were to continue to go the way they are now in the setting of acute kidney injury with worsening creatinine despite fluid support (creatinine 01/12/2017 7.2, potassium 5.5), trauma secondary to hip fracture, heart failure, anasarca, I would not be surprised if her prognosis was days to weeks.  I did share this with family  Discharge Planning:  To Be Determined  Care plan was discussed with Dr. Alfredia Ferguson, Dr. Justin Mend  Thank you for allowing the Palliative Medicine Team to assist in the care of this patient.   Time In: 0900 Time Out: 1030 Total Time 90 min Prolonged Time Billed  yes         Greater than 50%  of this time was spent counseling and coordinating care related to the above assessment and plan.  Dory Horn, NP  Please contact Palliative Medicine Team phone at [phone] for questions and concerns.

## 2017-01-12 NOTE — Progress Notes (Signed)
[facility] ROUNDING NOTE   Subjective:   Interval History:  83 year old lady with history of CAD s/p stent and atrial fibrillation and chronic renal disease  She presented with left hip fracture 11/5. Patient reported feeling unwell and in declining health for several weeks prior and had attributed it to starting a new blood pressure medication.  She developed oliguria and declining renal function postoperatively Her baseline renal function appears to be about 1.3.  She was profoundly hypotensive on initial evaluation and an unremarkable work up for hypotension was undertaken. Her antihypertensive medications were stopped and she was aggressively fluid resuscitated. Attempts to place a foley catheter were aborted due to anatomical difficulties following hip surgery and bladder scan failed to show any hydronephrosis.  She has been anuric and has received 10 L of IV fluid. Her creatinine has continued to worsen and midodrine was added. Palliative medicine met with family to discuss goals of care. She does not want dialysis or intubation and she was made DNR. The continued shock makes recovery unlikely and the only option would be CRRT.  This was posed to the family and they are realistic. A conservative approach is being considered by family and patient.   Objective:  Vital signs in last 24 hours:  Temp:  [97.2 F (36.2 C)-98.3 F (36.8 C)] 97.4 F (36.3 C) (11/11 0734) Pulse Rate:  [82-110] 101 (11/11 0734) Resp:  [10-28] 15 (11/11 0734) BP: (98-114)/(56-72) 114/63 (11/11 0734) SpO2:  [100 %] 100 % (11/11 0734)  Weight change:  Filed Weights   01/04/17 1300 01/06/17 1617  Weight: 155 lb (70.3 kg) 155 lb 0 oz (70.3 kg)    Intake/Output: I/O last 3 completed shifts: In: 4417 [P.O.:360; I.V.:3807; IV Piggyback:250] Out: -    Intake/Output this shift:  Total I/O In: 217.8 [I.V.:217.8] Out: -    Awake and alert and no complaints of pain CVS- RRR RS- CTA  2 L  oxygen ABD- BS present soft non-distended EXT-  2 + lower extremity  edema   Basic Metabolic Panel: Recent Labs  Lab 01/09/17 0449 01/09/17 1545 01/10/17 0357 01/11/17 0309 01/12/17 0312  NA 133* 134* 136 136 137  K 5.7* 5.5* 4.9 5.0 5.5*  CL 106 106 104 106 107  CO2 17* 17* 21* 19* 19*  GLUCOSE 104* 129* 136* 82 91  BUN 77* 76* 78* 78* 79*  CREATININE 6.11* 6.39* 6.53* 6.90* 7.20*  CALCIUM 7.5* 7.5* 7.0* 7.0* 7.3*  MG 2.1  --  2.0  --  1.8  PHOS 6.4*  --  6.5*  --  7.3*    Liver Function Tests: Recent Labs  Lab 01/09/17 0449 01/10/17 0357 01/11/17 0309 01/12/17 0312  AST 750* 736* 587* 577*  ALT 87* 41 24 19  ALKPHOS 154* 170* 180* 201*  BILITOT 1.0 1.1 1.2 1.2  PROT 5.4* 4.5* 4.4* 4.8*  ALBUMIN 2.4* 2.1* 2.0* 2.1*   No results for input(s): LIPASE, AMYLASE in the last 168 hours. No results for input(s): AMMONIA in the last 168 hours.  CBC: Recent Labs  Lab 01/08/17 0444 01/09/17 0449 01/10/17 0357 01/11/17 0309 01/12/17 0312  WBC 9.7 10.3 9.8 10.5 12.9*  NEUTROABS  --  8.4* 7.9*  --  10.4*  HGB 7.3* 7.1* 8.1* 8.2* 8.5*  HCT 22.0* 20.9* 24.0* 24.1* 25.3*  MCV 71.2* 71.6* 72.5* 72.8* 72.9*  PLT 100* 105* 96* 108* 125*    Cardiac Enzymes: Recent Labs  Lab 01/09/17 1033 01/09/17 1545 01/09/17 2101  TROPONINI 0.07* 0.07* 0.07*    BNP: Invalid input(s): POCBNP  CBG: No results for input(s): GLUCAP in the last 168 hours.  Microbiology: Results for orders placed or performed during the hospital encounter of 01/03/17  Surgical PCR screen     Status: None   Collection Time: 01/06/17  8:10 AM  Result Value Ref Range Status   MRSA, PCR NEGATIVE NEGATIVE Final   Staphylococcus aureus NEGATIVE NEGATIVE Final    Comment: (NOTE) The Xpert SA Assay (FDA approved for NASAL specimens in patients 73 years of age and older), is one component of a comprehensive surveillance program. It is not intended to diagnose infection nor to guide or monitor  treatment. Performed at [facility] [place] [address], [place], [place] [address]    *Note: Due to a large number of results and/or encounters for the requested time period, some results have not been displayed. A complete set of results can be found in Results Review.    Coagulation Studies: No results for input(s): LABPROT, INR in the last 72 hours.  Urinalysis: No results for input(s): COLORURINE, LABSPEC, PHURINE, GLUCOSEU, HGBUR, BILIRUBINUR, KETONESUR, PROTEINUR, UROBILINOGEN, NITRITE, LEUKOCYTESUR in the last 72 hours.  Invalid input(s): APPERANCEUR    Imaging: No results found.   Medications:   . sodium chloride 50 mL/hr at 01/12/17 0800  . heparin 400 Units/hr (01/12/17 0800)  . sodium chloride     . amiodarone  200 mg Oral Daily  . feeding supplement (ENSURE ENLIVE)  237 mL Oral BID BM  . midodrine  10 mg Oral TID WC  . nystatin  5 mL Oral QID  . pantoprazole  40 mg Oral BID  . polyethylene glycol  17 g Oral BID  . rosuvastatin  20 mg Oral QHS  . senna-docusate  1 tablet Oral BID  . Vitamin D (Ergocalciferol)  50,000 Units Oral Weekly   acetaminophen **OR** acetaminophen, ALPRAZolam, bisacodyl, HYDROcodone-acetaminophen, metoCLOPramide **OR** metoCLOPramide (REGLAN) injection, morphine injection, nitroGLYCERIN, ondansetron **OR** ondansetron (ZOFRAN) IV  Assessment/ Plan:  Pt is a89 y.o.yo femalewith a PMHX significant for CAD status post stent, CKD, hypertension, A. fib/flutter, anemia, thrombocytopenia, was [facility] to [facility] on 11/[facility]/2018after mechanical fall at home leading to left hip fracture requiring orthopedic surgery.  #Acute on chronic kidney disease, stable Despite aggressive volume resuscitation the patient has remained oligo- anuric  She appears to have shock hypotension that is limiting her recovery.  --Started  midodrine 10 mg tid --Discontinued amlodipine and metoprolol --Continue with NS 125 cc/hr, bolus as  needed --Monitor volume status and blood pressure --Follow-up on am kidney function labs  #Left hip fracture status post repair Managed by orthopedic surgery weight bearing as tolerated with PT and OT. --Continue pain controlwhen necessary  #Atrial fibrillation, controlled Patient is currently on heparin bridge and was given 1 dose ofEliquisstatus post surgery.Continue management per pharmacy.Will eventually transition to Eliquis.  #Hypotension BP  Labile only responding to fluid bolus and then drops into 70 systolic. Will need pressors in my opinion but conflicted about escalating care based on discussions with family  --Discontinued metoprolol and amlodipine --Will Continue to monitor BP   #Urinary retention, not improving Patient had bladder scan yesterday which showed only 48 ml. 11/9  Repeating may be useful although it appears that she is becoming more volume overloaded  # hyperkalemia    Consider kayexalate   # metabolic acidosis   Change to IV bicarbonate   Long discussion this morning with family and palliative care. The family  and patient are leaning towards comfort care.     LOS: 9 Gerrard Crystal W '@TODAY''@11'$ :47 AM

## 2017-01-12 NOTE — Progress Notes (Addendum)
[facility])  Received request from [place] for family interest in [facility] services at home after discharge. Chart and patient information under review by [facility] physician. Hospice eligibility pending at time of this note. (Eligibility confirmed)   Spoke with HCPOA/Gdtr  by phone and met face-to-face with patient's two daughters and another g-dtr to initiate education related to hospice philosophy, services and team approach to care. Family verbalized good understanding of information provided. Brochures with contact information provided to family members present. Provided contact information to [place] by phone. Per discussion, plan is for discharge to home by [facility]. Discharge date not determined at this time.   DME needs discussed. Family requests hospital bed and overbed table to be delivered to home. DME will be ordered from [place]. Marolyn Haller [phone]) is family member to contact for DME delivery to home. (DME has been ordered)   Please send signed out of facility DNR home with patient.  Please send scripts for any medication patient does not already have including comfort medications home with patient.   [facility] liaison will continue to follow daily until discharge.  Please call with hospice related questions.  Thank you for this referral and the opportunity to meet with this family.    Thank you,  Erling Conte, LCSW [phone]  [facility] liaisons are listed on [place] under Hospice and [place]

## 2017-01-12 NOTE — Care Management Note (Signed)
Case Management Note  Patient Details  Name: Virginia Casey MRN: [id number] Date of Birth: 27-Jun-1924  Subjective/Objective:  Met with pts daughter and Granddaughter [name] , whom the daughters have given authority, to discuss choice of Home with Hospice services. They have chosen [facility] and I have called and LM with Clerance Lav who is listed in [place] as the rep on call today. [name] can be reached at [phone]. No DME at home. Anticipating call today to begin preparations for discharge.                   Action/Plan: CM will follow closely for disposition/discharge needs.    Expected Discharge Date:                  Expected Discharge Plan:  Home w Hospice Care  In-House Referral:     Discharge planning Services  CM Consult  Post Acute Care Choice:  Hospice Choice offered to:  Adult Children  DME Arranged:    DME Agency:  [facility]  HH Arranged:    [facility] Agency:  [facility] [place]  Status of Service:  Completed, signed off  If discussed at [place] of Stay Meetings, dates discussed:    Additional Comments:  Delrae Sawyers, RN 01/12/2017, 11:55 AM

## 2017-01-12 NOTE — Progress Notes (Signed)
PROGRESS NOTE    Virginia Casey  EUM:[id number] DOB: 1924-08-01 DOA: 01/03/2017 PCP: Noralee Space, MD   Brief Narrative:  Virginia Casey is a 83 y.o. female with a history of CAD status post stents, atrial fibrillation/flutter, essential hypertension, chronic kidney disease, anemia, thrombocytopenia as well as other comorbids who presented after a fall and was found to have a left hip fracture. Orthopedic surgery was consulted and is recommended surgery on Monday, November 5 secondary to chronic anticoagulation use. Patient is s/p intramedullary implant on 11/5. Cr worsened so she was worked up and Nephrology and Urology were consulted. Worsening AKI on CKD likely from Hypotension so Anti-hypertensives discontinued and patient getting IVF Rehydrated. Cr worsened and patient remains Hypotensive so she was transferred to SDU for closer monitor, rehydrated and Cortisol, Troponin, and ECHOCardiogram being checked.  Patient continues to by intermittently hypotensive and has been getting more fluid boluses. Has not made very much urine and concern of volume overloading patient has her Albumin is 2.0. Discussed with Nephrology Dr. Justin Mend who recommended Palliative Care Consultation. Palliative Care met with the family and patient made DNR. Palliative and Nephrology met with the patient and discussed options about CRRT and family and patient leaning towards conservative measures and Hospice and home comfort care. Hospice Nurse consulted and Home DME is being set up. Plan is to continue continue current level of care for the next 24 hours as they are leaning towards Hospice Care but not a firm decision today.   Assessment & Plan:   Active Problems:   Anemia   Essential hypertension   ATRIAL FLUTTER, PAROXYSMAL   Peripheral vascular disease (HCC)   Asthmatic bronchitis   Closed left hip fracture (HCC)   Hip fracture (HCC)   Elevated troponin   Abnormal LFTs   Pressure injury of skin   Atrial fibrillation  (HCC)   Anticoagulant long-term use   Surgery, elective   AKI (acute kidney injury) ([place])   Palliative care by specialist  Left hip fracture s/p Intramedullary Implant  -S/p left subtrochanteric intramedullary implant on 01/06/17 -Appreciate Orthopedic surgery recommendations: -Morphine changed to po Oxycodone 2.5-5 mg po q4hprn  -C/w PT/OT; Ortho recommending Turning/Decubtius Precautions -Now Leaning toward Comfort and Home Hospice  Atrial Flutter -C/w Amiodarone 200 mg po Daily and Metoprolol stopped by Nephro -Holding Eliquis for now given Renal Fxn  -C/w Heparin gtt currently; May stop if patient goes home with Hospice -Checked ECHOCardiogram as below   Essential Hypertension >> Persistent Hypotension  -Discontinued Amlodipine and Metoprolol -Discontinued Hydralazine IV prn  -C/w IVF Rehydration; Give IVF Boluses  -Discussed with PCCM and appreciated their evaluation; Given IVF and Map is Adequate for now but states to maintain positive Balance  -Checked Troponin and stable at 0.07 , Cortisol Level normal, and ECHOCardiogram as below -Nephrology adding Midodrine; Patient continues to have persistent hypotension -Continue to Monitor Volume Status; Patient is +10.843 Liters -Palliative Care Consulted for Goals of Care Discussion -Family meeting with Nephrology and Palliative discussed about aggressive measures but family and patient leaning toward conservative approach with Hospice. [facility] consulted -Palliative Care to follow up in AM to discuss further next steps and likely Hospice at Home  ? Urinary retention, less likely Transient. Voiding. +1460 mL since admission -continue to watch UOP -strict in/out -bladder scan showed 13 mL's; repeat Bladder Scan showed 48 mls -Urology Attempted Foley Catheterization but unsuccessful and discussed with Dr. Alinda Money and will hold off for now -Not making  urine and is Oliguric/Anuric  Acute kidney  injury on CKD 3, worsening  -No documented urine in/out; Cr worsened significantly likely in the setting of Hypoperfusion from Hypotension; Cr now 7.20 -Nephrology consulted and stopped Anti-hypertensives; Now starting Midodrine -Appreciate Additional Nephrology Recommendations;  -Renal U/S was unremarkable except a Right Renal Cyst -Nephrology started Bicarbonate gtt and switching to NS -Palliative Care Consulted for Goals of Care Discussion; Appreciate them meeting with the Family this afternoon -Patient states she does not want dialysis on an ongoing basis  -Patient and family leaning towards going home with Hospice as opposed to Aggressive Measures  Hyperlipidemia -Continue Rosuvastatin 20 mg po qHS  Leukocytosis, worsened  -Was initially Likely stress related due to Pain and Surgery. WBC trending up now and went from 9.8 -> 10.5 -> 12.9; Likely reactive to Pain and Stress -Afebrile. No source for infection. Stable. -Continue to Monitor for S/Sx of Infection and repeat CBC in AM  Thrombocytopenia -Improving. Likely stress related. Platelet Count went from 96 -> 108 -> 125 -Continue to trend CBC and Monitor for S/Sx of Bleeding   Hyperkalemia -Given Kayexleate x1 previously but will not treat today given discussion with Palliative -Bicarb Gtt changed to NS -K+ is now 5.5 -Repeat CMP in AM   Elevated Troponin -Troponin was 0.07 x3 and Flat -Likely in the setting of Hypotension and Worsening Renal Failure -Continue to Trend -Checked ECHOCardiogram as below   ABLA in the setting of AoCKD -Hb/Hct Trending down and trended down to 7.1/20.9 -Transfusion of 1 unit of pRBC's and Hb/Hct improved to 8.5/25.3 -Continue to Monitor for S/Sx of Bleeding -Repeat CBC in AM  Abnormal LFT's/Transaminitis -AST went to 750 -> 736 -> 587  -> 577 and ALT 87 -> 41 -> 24 -> 19 -Likely 2/2 to Hypoperfusion -C/w IVF Rehydration per Nephro -Repeat CMP in AM and if worsening consider obtaining  RUQ U/S and LFT's  Hyperphosphatemia -Patient's Phos Level went from 6.5 -> 7.3 -Likely from Renal Failure -Repeat Phos Level in AM  DVT prophylaxis: SCD's; Anticoagulated with Heparin gtt Code Status: DO NOT RESUSCITATE Family Communication: Discussed with Family at bedside  Disposition Plan: Leaning toward [place]   Consultants:   Orthopedic Surgery  Nephrology  Urology  PCCM   Procedures:  Left Hip Fracture s/p Left Hip Intertrochanteric Nail   ECHOCARDIOGRAM ------------------------------------------------------------------- Study Conclusions  - Left ventricle: The cavity size was normal. Wall thickness was   normal. Systolic function was vigorous. The estimated ejection   fraction was in the range of 65% to 70%. Wall motion was normal;   there were no regional wall motion abnormalities. - Mitral valve: Moderately calcified annulus. Moderately thickened,   moderately calcified leaflets . There was mild regurgitation. - Left atrium: The atrium was moderately dilated. - Tricuspid valve: There was mild-moderate regurgitation. - Pulmonary arteries: Systolic pressure was moderately increased.   PA peak pressure: 59 mm Hg (S).  Antimicrobials: Anti-infectives (From admission, onward)   Start     Dose/Rate Route Frequency Ordered Stop   01/07/17 0500  ceFAZolin (ANCEF) IVPB 1 g/50 mL premix     1 g 100 mL/hr over 30 Minutes Intravenous Every 12 hours 01/06/17 2007 01/07/17 1723   01/06/17 1715  ceFAZolin (ANCEF) IVPB 2g/100 mL premix     2 g 200 mL/hr over 30 Minutes Intravenous To [facility]Stay [facility] 01/06/17 1648 01/06/17 1730   01/06/17 1649  ceFAZolin (ANCEF) 2-4 GM/100ML-% IVPB    Comments:  Virginia Casey   : cabinet  override      01/06/17 1649 01/06/17 1730     Subjective: Seen and examined and had some chest discomfort last night. No nausea or vomiting. No lightheadedness or dizziness. Explained to the patient and family that the kidneys have shut  down. Had some questions for the Nephrologist. No other concerns or complaints at this time.   Objective: Vitals:   01/12/17 0000 01/12/17 0400 01/12/17 0734 01/12/17 1600  BP: 108/62 106/62 114/63 124/61  Pulse: 95 (!) 101 (!) 101 (!) 103  Resp: '11 10 15 17  '$ Temp: (!) 97.3 F (36.3 C) (!) 97.2 F (36.2 C) (!) 97.4 F (36.3 C) 98 F (36.7 C)  TempSrc: Axillary Oral Oral Oral  SpO2: 100% 100% 100% 100%  Weight:      Height:        Intake/Output Summary (Last 24 hours) at 01/12/2017 1900 Last data filed at 01/12/2017 1600 Gross per 24 hour  Intake 1515.8 ml  Output -  Net 1515.8 ml   Filed Weights   01/04/17 1300 01/06/17 1617  Weight: 70.3 kg (155 lb) 70.3 kg (155 lb 0 oz)   Examination: Physical Exam:  Constitutional: WN/WD AAF in NAD appears calm Eyes: Sclerae anicteric. Lids normal ENMT: External Ears and nose appear normal. Neck: Supple with no JVD Respiratory: Diminished to auscultation with scattered crackles. No appreciable wheezing. Cardiovascular: Tachycardic rate. 2-3+ LE and 1+ UE Edema Abdomen: Soft, NT, ND. Bowel sounds present GU: Deferred Musculoskeletal: No contractures. No cyanosis Skin: Warm and dry. Left Hip incisions appear C/D/I Neurologic: CN 2-12 grossly intact. No appreciable focal deficits Psychiatric: Pleasant mood and affect. Intact judgement and insight  Data Reviewed: I have personally reviewed following labs and imaging studies  CBC: Recent Labs  Lab 01/08/17 0444 01/09/17 0449 01/10/17 0357 01/11/17 0309 01/12/17 0312  WBC 9.7 10.3 9.8 10.5 12.9*  NEUTROABS  --  8.4* 7.9*  --  10.4*  HGB 7.3* 7.1* 8.1* 8.2* 8.5*  HCT 22.0* 20.9* 24.0* 24.1* 25.3*  MCV 71.2* 71.6* 72.5* 72.8* 72.9*  PLT 100* 105* 96* 108* 225*   Basic Metabolic Panel: Recent Labs  Lab 01/09/17 0449 01/09/17 1545 01/10/17 0357 01/11/17 0309 01/12/17 0312  NA 133* 134* 136 136 137  K 5.7* 5.5* 4.9 5.0 5.5*  CL 106 106 104 106 107  CO2 17* 17* 21* 19*  19*  GLUCOSE 104* 129* 136* 82 91  BUN 77* 76* 78* 78* 79*  CREATININE 6.11* 6.39* 6.53* 6.90* 7.20*  CALCIUM 7.5* 7.5* 7.0* 7.0* 7.3*  MG 2.1  --  2.0  --  1.8  PHOS 6.4*  --  6.5*  --  7.3*   GFR: Estimated Creatinine Clearance: 4.9 mL/min (A) (by C-G formula based on SCr of 7.2 mg/dL (H)). Liver Function Tests: Recent Labs  Lab 01/09/17 0449 01/10/17 0357 01/11/17 0309 01/12/17 0312  AST 750* 736* 587* 577*  ALT 87* 41 24 19  ALKPHOS 154* 170* 180* 201*  BILITOT 1.0 1.1 1.2 1.2  PROT 5.4* 4.5* 4.4* 4.8*  ALBUMIN 2.4* 2.1* 2.0* 2.1*   No results for input(s): LIPASE, AMYLASE in the last 168 hours. No results for input(s): AMMONIA in the last 168 hours. Coagulation Profile: No results for input(s): INR, PROTIME in the last 168 hours. Cardiac Enzymes: Recent Labs  Lab 01/09/17 1033 01/09/17 1545 01/09/17 2101  TROPONINI 0.07* 0.07* 0.07*   BNP (last 3 results) No results for input(s): PROBNP in the last 8760 hours. HbA1C: No results for input(s):  HGBA1C in the last 72 hours. CBG: No results for input(s): GLUCAP in the last 168 hours. Lipid Profile: No results for input(s): CHOL, HDL, LDLCALC, TRIG, CHOLHDL, LDLDIRECT in the last 72 hours. Thyroid Function Tests: No results for input(s): TSH, T4TOTAL, FREET4, T3FREE, THYROIDAB in the last 72 hours. Anemia Panel: No results for input(s): VITAMINB12, FOLATE, FERRITIN, TIBC, IRON, RETICCTPCT in the last 72 hours. Sepsis Labs: No results for input(s): PROCALCITON, LATICACIDVEN in the last 168 hours.  Recent Results (from the past 240 hour(s))  Surgical PCR screen     Status: None   Collection Time: 01/06/17  8:10 AM  Result Value Ref Range Status   MRSA, PCR NEGATIVE NEGATIVE Final   Staphylococcus aureus NEGATIVE NEGATIVE Final    Comment: (NOTE) The Xpert SA Assay (FDA approved for NASAL specimens in patients 7 years of age and older), is one component of a comprehensive surveillance program. It is not  intended to diagnose infection nor to guide or monitor treatment. Performed at [facility], [place] [address] [place], [place] [address]     Radiology Studies: No results found. Scheduled Meds: . amiodarone  200 mg Oral Daily  . feeding supplement (ENSURE ENLIVE)  237 mL Oral BID BM  . midodrine  10 mg Oral TID WC  . pantoprazole  40 mg Oral BID  . polyethylene glycol  17 g Oral BID  . rosuvastatin  20 mg Oral QHS  . senna-docusate  1 tablet Oral BID  . Vitamin D (Ergocalciferol)  50,000 Units Oral Weekly   Continuous Infusions: . heparin 400 Units/hr (01/12/17 1600)  .  sodium bicarbonate (isotonic) infusion in sterile water 150 mEq (01/12/17 1300)  . sodium chloride      LOS: 9 days   Kerney Elbe, DO [facility] Pager [phone]  If 7PM-7AM, please contact night-coverage [email or web address] Password [email or web address] 01/12/2017, 7:00 PM

## 2017-01-12 NOTE — Progress Notes (Signed)
ANTICOAGULATION CONSULT NOTE - Follow-Up Consult  Pharmacy Consult:  Heparin Indication: atrial fibrillation  No Known Allergies  Patient Measurements: Height: 5' 2.01" (157.5 cm) Weight: 155 lb 0 oz (70.3 kg) IBW/kg (Calculated) : 50.12 Heparin Dosing Weight: 65 kg  Assessment: 89 YOF with history of Afib on Eliquis PTA.  Patient was transitioned to IV heparin for surgery on 01/06/17, then transitioned back to Eliquis and received one dose before transitioning back to heparin bridge.  She has AKI.  Currently using aPTT to guide heparin dosing because Eliquis can influence heparin levels. Heparin level (0.72) is elevated and aPTT (85) is within goal range. No signs/symptoms of bleeding. CBC stable. No infusion issues.   Goal of Therapy:  Heparin level 0.3-0.7 units/ml aPTT 66 - 102 seconds Monitor platelets by anticoagulation protocol: Yes   Plan:  Continue heparin to 400 units/hr Obtain confirmatory 8 hr HL/aPTT Monitor for s/sx of bleeding Follow-up on plans with palliative regarding anticoagulation  Doylene Canard, PharmD Clinical Pharmacist  Pager: [phone] Phone: [phone]

## 2017-01-12 NOTE — Progress Notes (Signed)
ANTICOAGULATION CONSULT NOTE - Follow-Up Consult  Pharmacy Consult:  Heparin Indication: atrial fibrillation  No Known Allergies  Patient Measurements: Height: 5' 2.01" (157.5 cm) Weight: 155 lb 0 oz (70.3 kg) IBW/kg (Calculated) : 50.12 Heparin Dosing Weight: 65 kg  Assessment: 89 YOF with history of Afib on Eliquis PTA.  Patient was transitioned to IV heparin for surgery on 01/06/17, then transitioned back to Eliquis and received one dose before transitioning back to heparin bridge.  She has AKI.  Currently using aPTT to guide heparin dosing because Eliquis can influence heparin levels.  APTT is elevated this AM. Should be able to start using anti-Xa alone tomorrow.   Goal of Therapy:  Heparin level 0.3-0.7 units/ml aPTT 66 - 102 seconds Monitor platelets by anticoagulation protocol: Yes   Plan:  Dec heparin to 400 units/hr 1300 HL/aPTT Monitor for s/sx of bleeding  Narda Bonds, PharmD, BCPS Clinical Pharmacist Phone: [phone]

## 2017-01-13 ENCOUNTER — Telehealth: Payer: Self-pay | Admitting: Pulmonary Disease

## 2017-01-13 ENCOUNTER — Encounter (HOSPITAL_COMMUNITY): Payer: Medicare Other

## 2017-01-13 DIAGNOSIS — Z7189 Other specified counseling: Secondary | ICD-10-CM

## 2017-01-13 LAB — CBC
HCT: 24.6 % — ABNORMAL LOW (ref 36.0–46.0)
HEMOGLOBIN: 8.3 g/dL — AB (ref 12.0–15.0)
MCH: 24.7 pg — AB (ref 26.0–34.0)
MCHC: 33.7 g/dL (ref 30.0–36.0)
MCV: 73.2 fL — ABNORMAL LOW (ref 78.0–100.0)
PLATELETS: 136 10*3/uL — AB (ref 150–400)
RBC: 3.36 MIL/uL — AB (ref 3.87–5.11)
RDW: 26.2 % — ABNORMAL HIGH (ref 11.5–15.5)
WBC: 12.4 10*3/uL — ABNORMAL HIGH (ref 4.0–10.5)

## 2017-01-13 LAB — COMPREHENSIVE METABOLIC PANEL
ALBUMIN: 2 g/dL — AB (ref 3.5–5.0)
ALK PHOS: 229 U/L — AB (ref 38–126)
ALT: 15 U/L (ref 14–54)
ANION GAP: 13 (ref 5–15)
AST: 508 U/L — ABNORMAL HIGH (ref 15–41)
BUN: 88 mg/dL — ABNORMAL HIGH (ref 6–20)
CALCIUM: 7.8 mg/dL — AB (ref 8.9–10.3)
CHLORIDE: 105 mmol/L (ref 101–111)
CO2: 19 mmol/L — AB (ref 22–32)
Creatinine, Ser: 7.9 mg/dL — ABNORMAL HIGH (ref 0.44–1.00)
GFR calc non Af Amer: 4 mL/min — ABNORMAL LOW (ref >60)
GFR, EST AFRICAN AMERICAN: 5 mL/min — AB (ref >60)
GLUCOSE: 93 mg/dL (ref 65–99)
POTASSIUM: 6.1 mmol/L — AB (ref 3.5–5.1)
SODIUM: 137 mmol/L (ref 135–145)
Total Bilirubin: 1.5 mg/dL — ABNORMAL HIGH (ref 0.3–1.2)
Total Protein: 4.8 g/dL — ABNORMAL LOW (ref 6.5–8.1)

## 2017-01-13 LAB — DIFFERENTIAL
BASOS PCT: 0 %
Basophils Absolute: 0 10*3/uL (ref 0.0–0.1)
EOS ABS: 0 10*3/uL (ref 0.0–0.7)
Eosinophils Relative: 0 %
LYMPHS PCT: 7 %
Lymphs Abs: 0.9 10*3/uL (ref 0.7–4.0)
MONO ABS: 1.5 10*3/uL — AB (ref 0.1–1.0)
Monocytes Relative: 12 %
NEUTROS PCT: 81 %
Neutro Abs: 10 10*3/uL — ABNORMAL HIGH (ref 1.7–7.7)

## 2017-01-13 LAB — MAGNESIUM: MAGNESIUM: 2 mg/dL (ref 1.7–2.4)

## 2017-01-13 LAB — PHOSPHORUS: Phosphorus: 8.6 mg/dL — ABNORMAL HIGH (ref 2.5–4.6)

## 2017-01-13 LAB — HEPARIN LEVEL (UNFRACTIONATED): HEPARIN UNFRACTIONATED: 0.69 [IU]/mL (ref 0.30–0.70)

## 2017-01-13 LAB — APTT: APTT: 91 s — AB (ref 24–36)

## 2017-01-13 MED ORDER — SODIUM CHLORIDE 0.9 % IV BOLUS (SEPSIS): 500.0000 mL | Freq: Once | INTRAVENOUS | Status: DC

## 2017-01-13 MED ORDER — MIDODRINE HCL 10 MG PO TABS: 10.0000 mg | ORAL_TABLET | Freq: Three times a day (TID) | ORAL | 0 refills | Status: AC

## 2017-01-13 MED ORDER — ENSURE ENLIVE PO LIQD: 237.0000 mL | Freq: Two times a day (BID) | ORAL | 12 refills | Status: AC

## 2017-01-13 MED ORDER — HYDROCODONE-ACETAMINOPHEN 5-325 MG PO TABS
1.0000 | ORAL_TABLET | Freq: Four times a day (QID) | ORAL | 0 refills | Status: AC | PRN
Start: 2017-01-13 — End: ?

## 2017-01-13 MED ORDER — POLYETHYLENE GLYCOL 3350 17 G PO PACK: 17.0000 g | PACK | Freq: Two times a day (BID) | ORAL | 0 refills | Status: AC

## 2017-01-13 MED ORDER — ONDANSETRON HCL 4 MG PO TABS: 4.0000 mg | ORAL_TABLET | Freq: Four times a day (QID) | ORAL | 0 refills | Status: AC | PRN

## 2017-01-13 NOTE — Progress Notes (Signed)
Discharged home via [facility] ambulance, family at bedside, belongings taken home. Discharge packet given to ambulance staff.

## 2017-01-13 NOTE — Telephone Encounter (Signed)
Per SN- Yes, willing to be attending.    Called and spoke with [name] at [facility], aware of SN's recs.  Nothing further needed.

## 2017-01-13 NOTE — Progress Notes (Signed)
OT Cancellation Note  Patient Details Name: Virginia Casey MRN: [id number] DOB: 01/16/1925   Cancelled Treatment:    Reason Eval/Treat Not Completed: Other (comment): Pt transitioning to comfort care per chart with plan to discharge home with hospice services this date. Family reports no questions/concerns for therapy prior to D/C. OT will sign off. Please re-order if needs change.   Norman Herrlich, MS OTR/L  Pager: [phone]   Norman Herrlich 01/13/2017, 2:44 PM

## 2017-01-13 NOTE — Discharge Summary (Signed)
Physician Discharge Summary  Virginia Casey HGD:[id number] DOB: 06/22/1924 DOA: 01/03/2017  PCP: Noralee Space, MD  Admit date: 01/03/2017 Discharge date: 01/13/2017  Admitted From: Home Disposition:  Home Hospice  Recommendations for Outpatient Follow-up:  1. Follow up Care per Hospice Protocol  Home Health: No  Equipment/Devices: Hospital Bed and Overbed Table    Discharge Condition: Guarded CODE STATUS: DO NOT RESUSCITATE Diet recommendation: Heart Healthy Diet  Brief/Interim Summary: Virginia Womac Jonesis a 83 y.o.female with a history of CAD status post stents, atrial fibrillation/flutter, essential hypertension, chronic kidney disease, anemia, thrombocytopenia as well as other comorbids who presented after a fall and was found to have a left hip fracture. Orthopedic surgery was consulted and recommendedsurgery on Monday, November 5 secondary to chronic anticoagulation use. Patient is s/p intramedullary implant on 11/5. Cr worsened so she was worked up and Nephrology and Urology were consulted. Worsening AKI on CKD likely from Hypotension so Anti-hypertensives discontinued and patient getting IVF Rehydrated. Cr worsened and patient remains Hypotensive so she was transferred to SDU for closer monitor, rehydrated and Cortisol, Troponin, and ECHOCardiogram being checked.  Patient continued to be intermittently hypotensive and has been getting more fluid boluses. Has not made very much urine and concern of volume overloading patient has her Albumin is 2.0. Discussed with Nephrology Dr. Justin Mend who recommended Palliative Care Consultation. Palliative Care met with the family and patient made DNR. Palliative and Nephrology met with the patient and discussed options about CRRT and family and patient leaning towards conservative measures and Hospice and home comfort care. Hospice Nurse consulted and Home DME is being set up. Plan is to D/C Home with Hospice today. Patient is a High Risk for  Decompensation.  Discharge Diagnoses:  Active Problems:   Anemia   Essential hypertension   ATRIAL FLUTTER, PAROXYSMAL   Peripheral vascular disease (HCC)   Asthmatic bronchitis   Closed left hip fracture (HCC)   Hip fracture (HCC)   Elevated troponin   Abnormal LFTs   Pressure injury of skin   Atrial fibrillation (HCC)   Anticoagulant long-term use   Surgery, elective   AKI (acute kidney injury) ([place])   Palliative care by specialist   Hyperphosphatemia  Left hip fracture s/p Intramedullary Implant  -S/p left subtrochanteric intramedullary implant on 01/06/17 -Appreciate Orthopedic surgery recommendations: -Morphine changed to po Oxycodone 2.5-5 mg po q4hprn  -C/w PT/OT; Ortho recommending Turning/Decubtius Precautions -Now Leaning toward Comfort and Home Hospice; Further Care per H[facility] Care at [facility] Bed and Overbed Table  Atrial Flutter -C/w Amiodarone 200 mg po Daily. Metoprolol stopped by Nephro -Holding Eliquis for now given Renal Fxn and will not continue at D/C -Heparin gtt To stop if patient goes home with Hospice -Checked ECHOCardiogram as below   Essential Hypertension >> Persistent Hypotension  -Discontinued Amlodipine and Metoprolol -Discontinued Hydralazine IV prn  -C/w IVF Rehydration; Give IVF Boluses  -Discussed with PCCM and appreciated their evaluation; Given IVF and Map is Adequate for now but states to maintain positive Balance  -Checked Troponin and stable at 0.07 , Cortisol Level normal, and ECHOCardiogram as below -Nephrology adding Midodrine; Patient continues to have persistent hypotension -Continue to Monitor Volume Status; Patient is +10.843 Liters -Palliative Care Consulted for Goals of Care Discussion -Family meeting with Nephrology and Palliative discussed about aggressive measures but family and patient leaning toward conservative approach with Hospice. Hospice and [place] consulted -Patient to go home with  Hospice today  Oliguria and Anuria  -Patient is +  13.5936 Liters  -continue to watch UOP -strict in/out -bladder scan showed 13 mL's; repeat Bladder Scan showed 48 mls; repeat bladder scan this AM showed 173-225 -Urology Attempted Foley Catheterization but unsuccessful and discussed with Dr. Alinda Money and will hold off for now -Not making urine and is Oliguric/Anuric -Not likely to benefit from CRRT and family choosing Conservative Measures -Hospice to follow at Discharge  Acute kidney injuryon CKD 3, worsening  -No documented urine in/out; Cr worsened significantly likely in the setting of Hypoperfusion from Hypotension; Cr now 7.90 -Nephrology consulted and stopped Anti-hypertensives; Now starting Midodrine -Appreciate Additional Nephrology Recommendations;  -Renal U/S was unremarkable except a Right Renal Cyst -Nephrology started Bicarbonate gtt and switching to NS -Palliative Care Consulted for Goals of Care Discussion; Appreciate them meeting with the Family this afternoon -Patient states she does not want dialysis on an ongoing basis  -Patient and family leaning towards going home with Hospice as opposed to Aggressive Measures; Hospice to Follow up at D/C  Hyperlipidemia -Continue Rosuvastatin 20 mg po qHS  Leukocytosis, worsened  -Was initially Likely stress related due to Pain and Surgery. WBC trending up now and went from 9.8 -> 10.5 -> 12.9 -> 12.4; Likely reactive to Pain and Stress -Afebrile. No source for infection. Stable. -Continue to Monitor for S/Sx of Infection -Further Care per Hospice Care  Thrombocytopenia -Improving. Likely stress related. Platelet Count went from 96 -> 108 -> 125 -> 136 -Follow up Care per Hospice Care  Hyperkalemia -Given Kayexleate x1 previously but will not treat anymore as patient going Home with Hospice -K+ is now 6.1 -Follow up Care per Hospice  Elevated Troponin -Troponin was 0.07 x3 and Flat -Likely in the setting of  Hypotension and Worsening Renal Failure -Continue to Trend -Checked ECHOCardiogram as below  -Going home with Hospice   ABLA in the setting of AoCKD -Hb/Hct Trending down and trended down to 7.1/20.9 -Transfusion of 1 unit of pRBC's and Hb/Hct improved to 8.3/24.6 -Continue to Monitor for S/Sx of Bleeding -Will Discontinue Anticoagulation with Eliquis at D/C as will going Home with Hospice  Abnormal LFT's/Transaminitis -AST went to 750 -> 736 -> 587  -> 577 -> 508 -ALT 87 -> 41 -> 24 -> 19 -> 15 -C/w IVF Rehydration per Nephro; Will Discontinue at D/C -Patient to go home with Hospice   Hyperphosphatemia -Patient's Phos Level went from 6.5 -> 7.3 -> 8.6 -Likely from Renal Failure -Patient going Home with Hospice today  Discharge Instructions  Discharge Instructions    Call MD for:  difficulty breathing, headache or visual disturbances   Complete by:  As directed    Call MD for:  extreme fatigue   Complete by:  As directed    Call MD for:  hives   Complete by:  As directed    Call MD for:  persistant dizziness or light-headedness   Complete by:  As directed    Call MD for:  persistant nausea and vomiting   Complete by:  As directed    Call MD for:  redness, tenderness, or signs of infection (pain, swelling, redness, odor or green/yellow discharge around incision site)   Complete by:  As directed    Call MD for:  severe uncontrolled pain   Complete by:  As directed    Call MD for:  temperature >100.4   Complete by:  As directed    Diet - low sodium heart healthy   Complete by:  As directed    Discharge instructions  Complete by:  As directed    Follow up per Hospice Protocol   Increase activity slowly   Complete by:  As directed      Allergies as of 01/13/2017   No Known Allergies     Medication List    STOP taking these medications   amLODipine 2.5 MG tablet Commonly known as:  NORVASC   ELIQUIS 2.5 MG Tabs tablet Generic drug:  apixaban   losartan  100 MG tablet Commonly known as:  COZAAR   metoprolol succinate 50 MG 24 hr tablet Commonly known as:  TOPROL-XL     TAKE these medications   acetaminophen 325 MG tablet Commonly known as:  TYLENOL Take 325 mg by mouth every 6 (six) hours as needed. For pain   ALPRAZolam 0.25 MG tablet Commonly known as:  XANAX take 1 tablet by mouth three times a day if needed   amiodarone 200 MG tablet Commonly known as:  PACERONE Take 200 mg by mouth daily.   dicyclomine 20 MG tablet Commonly known as:  BENTYL Take 1/2-1 tablet by mouth up to 4 times a day as needed for abdominal cramping   feeding supplement (ENSURE ENLIVE) Liqd Take 237 mLs 2 (two) times daily between meals by mouth. Start taking on:  01/14/2017   ferrous sulfate 325 (65 FE) MG tablet Commonly known as:  IRON SUPPLEMENT Take 1 tablet (325 mg total) by mouth daily with breakfast.   HYDROcodone-acetaminophen 5-325 MG tablet Commonly known as:  NORCO/VICODIN Take 1-2 tablets every 6 (six) hours as needed by mouth for moderate pain.   midodrine 10 MG tablet Commonly known as:  PROAMATINE Take 1 tablet (10 mg total) 3 (three) times daily with meals by mouth.   nitroGLYCERIN 0.4 MG SL tablet Commonly known as:  NITROSTAT Place 0.4 mg under the tongue every 5 (five) minutes as needed. May repeat x3   ondansetron 4 MG tablet Commonly known as:  ZOFRAN Take 1 tablet (4 mg total) every 6 (six) hours as needed by mouth for nausea.   pantoprazole 40 MG tablet Commonly known as:  PROTONIX take 1 tablet by mouth twice a day   polyethylene glycol packet Commonly known as:  MIRALAX / GLYCOLAX Take 17 g 2 (two) times daily by mouth.   rosuvastatin 20 MG tablet Commonly known as:  CRESTOR Take 20 mg by mouth at bedtime.   Vitamin D (Ergocalciferol) 50000 units Caps capsule Commonly known as:  DRISDOL take 1 capsule by mouth every week      Follow-up Information    Nicholes Stairs, MD. Schedule an  appointment as soon as possible for a visit in 2 weeks.   Specialty:  Orthopedic Surgery Why:  For wound re-check Contact information: [address] [place] [address] [phone]          No Known Allergies  Consultations:  Palliative Care Medicine  Nephrology  Orthopedic Surgery  Urology  Procedures/Studies: Dg Chest 1 View  Result Date: 01/03/2017 CLINICAL DATA:  Preop left hip fracture. EXAM: CHEST 1 VIEW COMPARISON:  04/13/2016 FINDINGS: Cardiomegaly with large hiatal hernia. Aortic atherosclerosis without aneurysm. Atelectasis at the lung bases without pneumonic consolidation or CHF. No effusion or pneumothorax. No acute osseous abnormality of bony thorax. Osteoarthritic spurring about the included left shoulder. IMPRESSION: 1. Stable cardiomegaly with large hiatal hernia and bibasilar atelectasis. 2. No focal pneumonia. 3. Aortic atherosclerosis. Electronically Signed   By: Ashley Royalty M.D.   On: 01/03/2017 17:44  US Renal  Result Date: 01/08/2017 CLINICAL DATA:  83 year old female with acute kidney injury. Hypertension. EXAM: RENAL / URINARY TRACT ULTRASOUND COMPLETE COMPARISON:  None. FINDINGS: Right Kidney: Length: 10.3 cm. Echogenicity within normal limits. No solid mass or hydronephrosis visualized. A 3.9 cm cyst within the upper right kidney identified. Left Kidney: Length: 10.4 cm. Echogenicity within normal limits. No mass or hydronephrosis visualized. Bladder: Appears normal for degree of bladder distention. IMPRESSION: Unremarkable renal ultrasound except right renal cyst. Electronically Signed   By: Margarette Canada M.D.   On: 01/08/2017 13:07   Dg C-arm 1-60 Min  Result Date: 01/06/2017 CLINICAL DATA:  83 y/o  F; intratrochanteric nailing of left hip. EXAM: DG C-ARM 61-120 MIN; OPERATIVE LEFT HIP WITH PELVIS COMPARISON:  01/03/2017 left hip radiographs FINDINGS: Four intraoperative fluoroscopic images of left proximal femur intertrochanteric fracture  nail fixation, femoral neck lag screw, and distal interlocking screw with improved angulation of proximal femur post fixation. Avulsed lesser trochanter. Fluoro time is 52 seconds. IMPRESSION: Intraoperative fluoroscopy of left intertrochanteric fracture nail fixation. Fluoro time is 52 seconds. Electronically Signed   By: Kristine Garbe M.D.   On: 01/06/2017 20:33   Dg Hip Operative Unilat W Or W/o Pelvis Left  Result Date: 01/06/2017 CLINICAL DATA:  83 y/o  F; intratrochanteric nailing of left hip. EXAM: DG C-ARM 61-120 MIN; OPERATIVE LEFT HIP WITH PELVIS COMPARISON:  01/03/2017 left hip radiographs FINDINGS: Four intraoperative fluoroscopic images of left proximal femur intertrochanteric fracture nail fixation, femoral neck lag screw, and distal interlocking screw with improved angulation of proximal femur post fixation. Avulsed lesser trochanter. Fluoro time is 52 seconds. IMPRESSION: Intraoperative fluoroscopy of left intertrochanteric fracture nail fixation. Fluoro time is 52 seconds. Electronically Signed   By: Kristine Garbe M.D.   On: 01/06/2017 20:33   Dg Hip Unilat W Or W/o Pelvis 2-3 Views Left  Result Date: 01/03/2017 CLINICAL DATA:  Left hip pain after fall today. EXAM: DG HIP (WITH OR WITHOUT PELVIS) 2-3V LEFT COMPARISON:  05/08/2015 FINDINGS: There is an acute, closed, intertrochanteric fracture of the left femur with avulsed lesser trochanter. Osteoarthritic joint space narrowing of the left hip. Intact bony pelvis, pubic rami and partially included right uncemented hip arthroplasty. Lower lumbar degenerative facet arthropathy. IMPRESSION: 1. Acute, closed, varus angulated intertrochanteric fracture of the left femur with avulsed lesser trochanter. 2. Lower lumbar degenerative facet arthropathy. 3. Intact bony pelvis and included right hip arthroplasty. Electronically Signed   By: Ashley Royalty M.D.   On: 01/03/2017 17:47    ECHOCARDIOGRAM  01/10/17 ------------------------------------------------------------------- Study Conclusions  - Left ventricle: The cavity size was normal. Wall thickness was   normal. Systolic function was vigorous. The estimated ejection   fraction was in the range of 65% to 70%. Wall motion was normal;   there were no regional wall motion abnormalities. - Mitral valve: Moderately calcified annulus. Moderately thickened,   moderately calcified leaflets . There was mild regurgitation. - Left atrium: The atrium was moderately dilated. - Tricuspid valve: There was mild-moderate regurgitation. - Pulmonary arteries: Systolic pressure was moderately increased.   PA peak pressure: 59 mm Hg (S).  Subjective: Seen and examined at bedside and appeared to be more confused today but comfortable. No CP or SOB. Patient to go home with Hospice today. Family in agreement and understand the plan. No other concerns or complaints at this time.  Discharge Exam: Vitals:   01/13/17 0714 01/13/17 1218  BP: (!) 95/51 118/66  Pulse: 96 98  Resp: 18 (!)  25  Temp: 98 F (36.7 C) 97.9 F (36.6 C)  SpO2: 100% 100%   Vitals:   01/12/17 2000 01/13/17 0041 01/13/17 0714 01/13/17 1218  BP: 114/86 100/72 (!) 95/51 118/66  Pulse:   96 98  Resp: (!) '23 20 18 '$ (!) 25  Temp:   98 F (36.7 C) 97.9 F (36.6 C)  TempSrc:   Oral Oral  SpO2:   100% 100%  Weight:      Height:       General: Pt is awake, not in acute distress Cardiovascular: Tachycardic Rage, S1/S2 +, no rubs, no gallops Respiratory: Diminished bilaterally, no wheezing, no rhonchi Abdominal: Soft, NT, ND, bowel sounds + Extremities: 2-3+ LE edema, no cyanosis; Left Hip Incisions appear C/D/I  The results of significant diagnostics from this hospitalization (including imaging, microbiology, ancillary and laboratory) are listed below for reference.    Microbiology: Recent Results (from the past 240 hour(s))  Surgical PCR screen     Status: None   Collection  Time: 01/06/17  8:10 AM  Result Value Ref Range Status   MRSA, PCR NEGATIVE NEGATIVE Final   Staphylococcus aureus NEGATIVE NEGATIVE Final    Comment: (NOTE) The Xpert SA Assay (FDA approved for NASAL specimens in patients 73 years of age and older), is one component of a comprehensive surveillance program. It is not intended to diagnose infection nor to guide or monitor treatment. Performed at [facility], [place] [address], [place], [place] [address]     Labs: BNP (last 3 results) No results for input(s): BNP in the last 8760 hours. Basic Metabolic Panel: Recent Labs  Lab 01/09/17 0449 01/09/17 1545 01/10/17 0357 01/11/17 0309 01/12/17 0312 01/13/17 0412  NA 133* 134* 136 136 137 137  K 5.7* 5.5* 4.9 5.0 5.5* 6.1*  CL 106 106 104 106 107 105  CO2 17* 17* 21* 19* 19* 19*  GLUCOSE 104* 129* 136* 82 91 93  BUN 77* 76* 78* 78* 79* 88*  CREATININE 6.11* 6.39* 6.53* 6.90* 7.20* 7.90*  CALCIUM 7.5* 7.5* 7.0* 7.0* 7.3* 7.8*  MG 2.1  --  2.0  --  1.8 2.0  PHOS 6.4*  --  6.5*  --  7.3* 8.6*   Liver Function Tests: Recent Labs  Lab 01/09/17 0449 01/10/17 0357 01/11/17 0309 01/12/17 0312 01/13/17 0412  AST 750* 736* 587* 577* 508*  ALT 87* 41 '24 19 15  '$ ALKPHOS 154* 170* 180* 201* 229*  BILITOT 1.0 1.1 1.2 1.2 1.5*  PROT 5.4* 4.5* 4.4* 4.8* 4.8*  ALBUMIN 2.4* 2.1* 2.0* 2.1* 2.0*   No results for input(s): LIPASE, AMYLASE in the last 168 hours. No results for input(s): AMMONIA in the last 168 hours. CBC: Recent Labs  Lab 01/09/17 0449 01/10/17 0357 01/11/17 0309 01/12/17 0312 01/13/17 0412  WBC 10.3 9.8 10.5 12.9* 12.4*  NEUTROABS 8.4* 7.9*  --  10.4* 10.0*  HGB 7.1* 8.1* 8.2* 8.5* 8.3*  HCT 20.9* 24.0* 24.1* 25.3* 24.6*  MCV 71.6* 72.5* 72.8* 72.9* 73.2*  PLT 105* 96* 108* 125* 136*   Cardiac Enzymes: Recent Labs  Lab 01/09/17 1033 01/09/17 1545 01/09/17 2101  TROPONINI 0.07* 0.07* 0.07*   BNP: Invalid input(s): POCBNP CBG: No  results for input(s): GLUCAP in the last 168 hours. D-Dimer No results for input(s): DDIMER in the last 72 hours. Hgb A1c No results for input(s): HGBA1C in the last 72 hours. Lipid Profile No results for input(s): CHOL, HDL, LDLCALC, TRIG, CHOLHDL, LDLDIRECT in the last 72  hours. Thyroid function studies No results for input(s): TSH, T4TOTAL, T3FREE, THYROIDAB in the last 72 hours.  Invalid input(s): FREET3 Anemia work up No results for input(s): VITAMINB12, FOLATE, FERRITIN, TIBC, IRON, RETICCTPCT in the last 72 hours. Urinalysis    Component Value Date/Time   COLORURINE YELLOW 06/16/2016 1622   APPEARANCEUR CLEAR 06/16/2016 1622   LABSPEC 1.016 06/16/2016 1622   PHURINE 5.0 06/16/2016 1622   GLUCOSEU NEGATIVE 06/16/2016 1622   GLUCOSEU NEGATIVE 05/26/2007 1157   HGBUR MODERATE (A) 06/16/2016 1622   BILIRUBINUR NEGATIVE 06/16/2016 1622   KETONESUR NEGATIVE 06/16/2016 1622   PROTEINUR 30 (A) 06/16/2016 1622   UROBILINOGEN 0.2 11/15/2012 2135   NITRITE NEGATIVE 06/16/2016 1622   LEUKOCYTESUR TRACE (A) 06/16/2016 1622   Sepsis Labs Invalid input(s): PROCALCITONIN,  WBC,  LACTICIDVEN Microbiology Recent Results (from the past 240 hour(s))  Surgical PCR screen     Status: None   Collection Time: 01/06/17  8:10 AM  Result Value Ref Range Status   MRSA, PCR NEGATIVE NEGATIVE Final   Staphylococcus aureus NEGATIVE NEGATIVE Final    Comment: (NOTE) The Xpert SA Assay (FDA approved for NASAL specimens in patients 76 years of age and older), is one component of a comprehensive surveillance program. It is not intended to diagnose infection nor to guide or monitor treatment. Performed at [facility], [place] [address] [place], [place] [address]    Time coordinating discharge: 25 minutes  SIGNED:  Kerney Elbe, DO [facility] 01/13/2017, 12:56 PM Pager [phone]  If 7PM-7AM, please contact night-coverage [email or web address] Password [email or web address]

## 2017-01-13 NOTE — Care Management Note (Addendum)
Case Management Note Previous Note Created by Brandt Loosen  Patient Details  Name: Virginia Casey MRN: [id number] Date of Birth: 1924/04/26  Subjective/Objective:  Met with pts daughter and Granddaughter [name] , whom the daughters have given authority, to discuss choice of Home with Hospice services. They have chosen [facility] and I have called and LM with Clerance Lav who is listed in Amion as the rep on call today. [name] can be reached at [phone]. No DME at home. Anticipating call today to begin preparations for discharge.                   Action/Plan: CM will follow closely for disposition/discharge needs.    Expected Discharge Date:  01/13/17               Expected Discharge Plan:  Home w Hospice Care  In-House Referral:     Discharge planning Services  CM Consult  Post Acute Care Choice:  Hospice Choice offered to:  Adult Children  DME Arranged:    DME Agency[facility]  [facility]  HH Arranged:    [facility] Agency:  [facility]  Status of Service:  Completed, signed off  If discussed at [place] of Stay Meetings, dates discussed:    Additional Comments: 01/13/2017  [facility] made contact with family - scheduled delivery time of 4pm-8pm.  Pts granddaughter will inform bedside nurse once equipment is delivered.  CM arranged transport packet and provided to nurse- nurse to call [facility] for transport once equipment is delivered.  Bedside nurse will also follow up/complete task with Hospice as requested per Hospice liaison note.  [facility] accepted oxygen referral and will contact family to arrange delivery STAT  CM informed by family that pt has all equipment in the home delivered with the exception of oxygen.  CM spoke with [facility] liaison - oxygen was not previously requested however pt has been on [place] and request oxygen in the home for comfort - [facility] to place STAT order for oxygen to be delivered to the home today, discharge is  written.  Pt will transport home via [facility] once oxygen is delivered to the home [name], RN 01/13/2017, 1:39 PM

## 2017-01-13 NOTE — Progress Notes (Signed)
[facility] ROUNDING NOTE   Subjective:   Interval History:  83 year old lady with history of CAD s/p stent and atrial fibrillation and chronic renal disease  She presented with left hip fracture 11/5. Patient reported feeling unwell and in declining health for several weeks prior and had attributed it to starting a new blood pressure medication.  She developed oliguria and declining renal function postoperatively Her baseline renal function appears to be about 1.3.  She was profoundly hypotensive on initial evaluation and an unremarkable work up for hypotension was undertaken. Her antihypertensive medications were stopped and she was aggressively fluid resuscitated. Attempts to place a foley catheter were aborted due to anatomical difficulties following hip surgery and bladder scan failed to show any hydronephrosis.  She has been anuric and has received 10 L of IV fluid. Her creatinine has continued to worsen and midodrine was added. Palliative medicine met with family to discuss goals of care. She does not want dialysis or intubation and she was made DNR. The continued shock makes recovery unlikely and the only option would be CRRT.  This was posed to the family and they are realistic. A conservative approach is being considered by family and patient.   Objective:  Vital signs in last 24 hours:  Temp:  [98 F (36.7 C)] 98 F (36.7 C) (11/12 0714) Pulse Rate:  [96-103] 96 (11/12 0714) Resp:  [17-23] 18 (11/12 0714) BP: (95-124)/(51-86) 95/51 (11/12 0714) SpO2:  [100 %] 100 % (11/12 0714)  Weight change:  Filed Weights   01/04/17 1300 01/06/17 1617  Weight: 155 lb (70.3 kg) 155 lb 0 oz (70.3 kg)    Intake/Output: I/O last 3 completed shifts: In: 2384.6 [P.O.:120; I.V.:2264.6] Out: -    Intake/Output this shift:  Total I/O In: 104 [I.V.:104] Out: -    Awake and alert and no complaints of pain CVS- RRR RS- CTA  2 L oxygen ABD- BS present soft non-distended EXT-   2 + lower extremity  edema   Basic Metabolic Panel: Recent Labs  Lab 01/09/17 0449 01/09/17 1545 01/10/17 0357 01/11/17 0309 01/12/17 0312 01/13/17 0412  NA 133* 134* 136 136 137 137  K 5.7* 5.5* 4.9 5.0 5.5* 6.1*  CL 106 106 104 106 107 105  CO2 17* 17* 21* 19* 19* 19*  GLUCOSE 104* 129* 136* 82 91 93  BUN 77* 76* 78* 78* 79* 88*  CREATININE 6.11* 6.39* 6.53* 6.90* 7.20* 7.90*  CALCIUM 7.5* 7.5* 7.0* 7.0* 7.3* 7.8*  MG 2.1  --  2.0  --  1.8 2.0  PHOS 6.4*  --  6.5*  --  7.3* 8.6*    Liver Function Tests: Recent Labs  Lab 01/09/17 0449 01/10/17 0357 01/11/17 0309 01/12/17 0312 01/13/17 0412  AST 750* 736* 587* 577* 508*  ALT 87* 41 '24 19 15  '$ ALKPHOS 154* 170* 180* 201* 229*  BILITOT 1.0 1.1 1.2 1.2 1.5*  PROT 5.4* 4.5* 4.4* 4.8* 4.8*  ALBUMIN 2.4* 2.1* 2.0* 2.1* 2.0*   No results for input(s): LIPASE, AMYLASE in the last 168 hours. No results for input(s): AMMONIA in the last 168 hours.  CBC: Recent Labs  Lab 01/09/17 0449 01/10/17 0357 01/11/17 0309 01/12/17 0312 01/13/17 0412  WBC 10.3 9.8 10.5 12.9* 12.4*  NEUTROABS 8.4* 7.9*  --  10.4* 10.0*  HGB 7.1* 8.1* 8.2* 8.5* 8.3*  HCT 20.9* 24.0* 24.1* 25.3* 24.6*  MCV 71.6* 72.5* 72.8* 72.9* 73.2*  PLT 105* 96* 108* 125* 136*  Cardiac Enzymes: Recent Labs  Lab 01/09/17 1033 01/09/17 1545 01/09/17 2101  TROPONINI 0.07* 0.07* 0.07*    BNP: Invalid input(s): POCBNP  CBG: No results for input(s): GLUCAP in the last 168 hours.  Microbiology: Results for orders placed or performed during the hospital encounter of 01/03/17  Surgical PCR screen     Status: None   Collection Time: 01/06/17  8:10 AM  Result Value Ref Range Status   MRSA, PCR NEGATIVE NEGATIVE Final   Staphylococcus aureus NEGATIVE NEGATIVE Final    Comment: (NOTE) The Xpert SA Assay (FDA approved for NASAL specimens in patients 21 years of age and older), is one component of a comprehensive surveillance program. It is not  intended to diagnose infection nor to guide or monitor treatment. Performed at [facility], [place] [address] [place], [place] [address]    *Note: Due to a large number of results and/or encounters for the requested time period, some results have not been displayed. A complete set of results can be found in Results Review.    Coagulation Studies: No results for input(s): LABPROT, INR in the last 72 hours.  Urinalysis: No results for input(s): COLORURINE, LABSPEC, PHURINE, GLUCOSEU, HGBUR, BILIRUBINUR, KETONESUR, PROTEINUR, UROBILINOGEN, NITRITE, LEUKOCYTESUR in the last 72 hours.  Invalid input(s): APPERANCEUR    Imaging: No results found.   Medications:   . heparin 400 Units/hr (01/12/17 1600)  .  sodium bicarbonate (isotonic) infusion in sterile water 150 mEq (01/12/17 1300)  . sodium chloride    . sodium chloride     . amiodarone  200 mg Oral Daily  . feeding supplement (ENSURE ENLIVE)  237 mL Oral BID BM  . midodrine  10 mg Oral TID WC  . pantoprazole  40 mg Oral BID  . polyethylene glycol  17 g Oral BID  . rosuvastatin  20 mg Oral QHS  . senna-docusate  1 tablet Oral BID  . Vitamin D (Ergocalciferol)  50,000 Units Oral Weekly   acetaminophen **OR** acetaminophen, ALPRAZolam, bisacodyl, HYDROcodone-acetaminophen, LORazepam, metoCLOPramide **OR** metoCLOPramide (REGLAN) injection, morphine injection, nitroGLYCERIN, ondansetron **OR** ondansetron (ZOFRAN) IV  Assessment/ Plan:  Pt is a83 y.o.yo femalewith a PMHX significant for CAD status post stent, CKD, hypertension, A. fib/flutter, anemia, thrombocytopenia, [facility]2018after mechanical fall at home leading to left hip fracture requiring orthopedic surgery.  #Acute on chronic kidney disease, stable Despite aggressive volume resuscitation the patient has remained oligo- anuric  She appears to have shock hypotension that is limiting her recovery. Creatinine (7.90) and BUN  (88) have gradually worsens since admission. Discussed with palliative and nephrology team option for patient. Family have decided patient will pursue hospice and decline CRRT. Midodrine was discontinued.  --Discontinued amlodipine and metoprolol --Continue with NS 125 cc/hr, bolus as needed --Monitor volume status and blood pressure --Follow-up on am kidney function labs  #Left hip fracture status post repair Managed by orthopedic surgery weight bearing as tolerated with PT and OT. --Continue pain controlwhen necessary  #Atrial fibrillation, controlled Patient is currently on heparin bridge and was given 1 dose ofEliquisstatus post surgery.Continue management per pharmacy.Will eventually transition to Eliquis.  #Hypotension Patient continue to have intermittent hypotension though BP has improved in the past 48 hrs. Patient has opted for hospice given anuria, age, worsening kidney function and low BP patient would have been poor candidate for dialysis.  --Discontinued metoprolol and amlodipine --Will Continue to monitor BP   #Urinary retention, not improving Patient had bladder scan yesterday which showed only  48 ml. 11/9  Repeating may be useful although it appears that she is becoming more volume overloaded  # Hyperkalemia, worsening Secondary to worsening kidney function. --Consider kayexalate   #Metabolic acidosis   --Change to IV bicarbonate   Patient and family have opted for hospice instead of CRRT. Family had a long discussion with Dr. Glendon Axe 11/11 and all questions were apparently answer. Nephrology team offered additional support and clarification this morning. Nephrology discussed with primary team plan of care and will sign off given transition to hospice. Dr. Joelyn Oms will be available for any questions that the family might have prior to discharge.   LOS: 10 Azia Toutant '@TODAY''@10'$ :00 AM

## 2017-01-13 NOTE — Progress Notes (Signed)
PT Cancellation Note  Patient Details Name: Virginia Casey MRN: [id number] DOB: 1925/02/27   Cancelled Treatment:    Reason Eval/Treat Not Completed: (P) Other (comment)(Pt with plans to d/c home with hospice today, pt sleeping on arrival and family denies PT needs before d/c home.  )   Cristela Blue 01/13/2017, 2:24 PM  Governor Rooks, PTA pager [phone]

## 2017-01-13 NOTE — Progress Notes (Signed)
Bladder scan done about 2000 and 173-225 ml scanned. Patient denies the need to void.

## 2017-01-13 NOTE — Progress Notes (Signed)
Pt. Ready to d/c home, [facility] ambulance made aware for transport. As per family home equipments and o2 tanks has been delivered in their home.

## 2017-01-13 NOTE — Progress Notes (Signed)
Daily Progress Note   Patient Name: Virginia Casey       Date: 01/13/2017 DOB: 04/01/1924  Age: 83 y.o. MRN#: [id number] Attending Physician: Kerney Elbe, DO Primary Care Physician: Noralee Space, MD Admit Date: 01/03/2017  Reason for Consultation/Follow-up: Disposition, Establishing goals of care, Hospice Evaluation and Psychosocial/spiritual support  Subjective: Patient states she feels better today and is in a better mood. Being fed small bites of food by family.   Length of Stay: 10  Current Medications: Scheduled Meds:  . amiodarone  200 mg Oral Daily  . feeding supplement (ENSURE ENLIVE)  237 mL Oral BID BM  . midodrine  10 mg Oral TID WC  . pantoprazole  40 mg Oral BID  . polyethylene glycol  17 g Oral BID  . rosuvastatin  20 mg Oral QHS  . senna-docusate  1 tablet Oral BID  . Vitamin D (Ergocalciferol)  50,000 Units Oral Weekly    Continuous Infusions: . heparin 400 Units/hr (01/12/17 1600)  .  sodium bicarbonate (isotonic) infusion in sterile water 150 mEq (01/12/17 1300)  . sodium chloride    . sodium chloride      PRN Meds: acetaminophen **OR** acetaminophen, ALPRAZolam, bisacodyl, HYDROcodone-acetaminophen, LORazepam, metoCLOPramide **OR** metoCLOPramide (REGLAN) injection, morphine injection, nitroGLYCERIN, ondansetron **OR** ondansetron (ZOFRAN) IV  Physical Exam  Constitutional: She is oriented to person, place, and time. She appears well-developed. She is cooperative. She is easily aroused. No distress.  HENT:  Head: Normocephalic and atraumatic.  Cardiovascular: Intact distal pulses. An irregularly irregular rhythm present.  Pulmonary/Chest: Effort normal and breath sounds normal. No respiratory distress.  Abdominal: Soft. She exhibits no distension. There is no  tenderness. There is no guarding.  Musculoskeletal:       Right forearm: She exhibits edema.       Left forearm: She exhibits edema.       Right lower leg: She exhibits edema.       Left lower leg: She exhibits edema.  Neurological: She is alert, oriented to person, place, and time and easily aroused.  Skin: Skin is warm and dry. Capillary refill takes less than 2 seconds. She is not diaphoretic.  Psychiatric: She has a normal mood and affect. Her behavior is normal.            Vital Signs: BP (!) 95/51 (BP Location: Left Arm)   Pulse 96   Temp 98 F (36.7 C) (Oral)   Resp 18   Ht 5' 2.01" (1.575 m)   Wt 70.3 kg (155 lb 0 oz)   SpO2 100%   BMI 28.34 kg/m  SpO2: SpO2: 100 % O2 Device: O2 Device: Nasal Cannula O2 Flow Rate: O2 Flow Rate (L/min): 2 L/min  Intake/output summary:   Intake/Output Summary (Last 24 hours) at 01/13/2017 1051 Last data filed at 01/13/2017 0900 Gross per 24 hour  Intake 1264.8 ml  Output -  Net 1264.8 ml   LBM: Last BM Date: 01/03/17 Baseline Weight: Weight: 70.3 kg (155 lb) Most recent weight: Weight: 70.3 kg (155 lb 0 oz)       Palliative Assessment/Data: 30%    Flowsheet Rows     Most Recent Value  Intake Tab  Referral Department  Nephrology  Unit at Time of Referral  ICU  Palliative Care Primary Diagnosis  Nephrology  Date Notified  01/11/17  Palliative Care Type  New Palliative care  Reason for referral  Clarify Goals of Care, Psychosocial or Spiritual support  Date of Admission  01/03/17  Date first seen by Palliative Care  01/11/17  # of days Palliative referral response time  0 Day(s)  # of days IP prior to Palliative referral  8  Clinical Assessment  Palliative Performance Scale Score  40%  Pain Max last 24 hours  Not able to report  Pain Min Last 24 hours  Not able to report  Dyspnea Max Last 24 Hours  Not able to report  Dyspnea Min Last 24 hours  Not able to report  Nausea Max Last 24 Hours  Not able to report  Nausea  Min Last 24 Hours  Not able to report  Anxiety Max Last 24 Hours  Not able to report  Anxiety Min Last 24 Hours  Not able to report  Other Max Last 24 Hours  Not able to report  Psychosocial & Spiritual Assessment  Palliative Care Outcomes  Patient/Family meeting held?  Yes  Who was at the meeting?  10 family memebers including granddaughter, [name], [place]  Patient/Family wishes: Interventions discontinued/not started   Mechanical Ventilation, Hemodialysis  Palliative Care follow-up planned  Yes, Facility      Patient Active Problem List   Diagnosis Date Noted  . Hyperphosphatemia 01/12/2017  . Palliative care by specialist   . Elevated troponin 01/09/2017  . Abnormal LFTs 01/09/2017  . Pressure injury of skin 01/09/2017  . Atrial fibrillation ([place])   . Anticoagulant long-term use   . Surgery, elective   . AKI (acute kidney injury) ([place])   . Closed left hip fracture ([place]) 01/03/2017  . Hip fracture ([place]) 01/03/2017  . Closed 2-part intertrochanteric fracture of proximal end of left femur ([place])   . Unstable angina ([place]) 04/13/2016  . Fall 05/12/2015  . IBS (irritable bowel syndrome) 07/30/2014  . Asthmatic bronchitis 07/16/2010  . Disorder of thyroid 04/18/2010  . Vitamin D deficiency 07/26/2008  . BACK PAIN, LUMBAR 07/26/2008  . ATRIAL FLUTTER, PAROXYSMAL 07/11/2008  . Diverticulosis of large intestine 07/09/2007  . COLONIC POLYPS 03/16/2007  . HYPERCHOLESTEROLEMIA 03/16/2007  . Anemia 03/16/2007  . Anxiety state 03/16/2007  . Essential hypertension 03/16/2007  . Coronary atherosclerosis 03/16/2007  . Peripheral vascular disease ([place]) 03/16/2007  . Diaphragmatic hernia 03/16/2007  . Osteoarthritis 03/16/2007    Palliative Care Assessment & Plan   HPI: 83 y.o.femalewith past medical history of coronary artery disease s/p stent, afib, hypertension, chronic kidney disease stage III, thrombocytopenia, and anemiaadmitted on 11/2/2018with left hip fracture  secondary to a fall. Patient underwent surgery for left hip fracture on 01/06/2017. Postop she began to develop acute kidney injury superimposed on chronic kidney disease on 01/08/2017. She was transferred to the stepdown unit for closer monitoring because of hypotension on 01/09/2017. Despite fluid boluses her creatinine has continued to trend up and blood pressure has remained soft. Her creatinine as of 01/13/2017 is 7.9, potassium 6.1, and her blood pressures have ranged from 93-267 systolic and 12-45 diastolic.[name]   PMT was consulted 11/10 for goals of care. Follow up meeting this AM to continue discussion.  Assessment: This NP and Charlynn Court, NP met with patient and at bedside. We discussed patient's and family's thoughts on disposition plan. They are comfortable going home with hospice and  we discussed what that type of care will look like. We discussed changes made to current medical treatment to align patient's care with what she will receive at home, specifically discontinuing labs and heparin drip.  Recommendations/Plan:  Continue norco 5-325 mg 1-2 tabs q6hr PRN  Continue alprazolam 0.25 mg TID PRN  Continue ondansetron 4 mg q6hr PRN  Home w/hospice today  Continue IV bicarb drip until discharge  Discontinue all labs and heparin drip  Goals of Care and Additional Recommendations:  Limitations on Scope of Treatment: Minimize Medications, Initiate Comfort Feeding, No Artificial Feeding, No Blood Transfusions, No Diagnostics, No Glucose Monitoring, No Hemodialysis, No IV Antibiotics and No Lab Draws  Code Status:  DNR  Prognosis:   < 2 weeks  Discharge Planning:  Home with Hospice  Care plan was discussed with patient, family, case manager, and bedside nurse   Thank you for allowing the Palliative Medicine Team to assist in the care of this patient.   Total Time 20 minutes Prolonged Time Billed  no       Greater than 50%  of this time was spent counseling and  coordinating care related to the above assessment and plan.  Juel Burrow, DNP, AGNP-C Palliative Medicine Team Team Phone # [phone]

## 2017-01-13 NOTE — Consult Note (Signed)
   [facility] Inpatient Consult   01/13/2017  Virginia Casey [birth year] [id number]  Patient screened for potential [facility] services. Patient is in network [facility] under patient's [facility]  plan.   Chart review reveals patient to discharge home with hospice today. Will sign off.   Natividad Brood, RN BSN [facility] Liaison  [phone] business mobile phone Toll free office [phone]

## 2017-01-13 NOTE — Progress Notes (Signed)
ANTICOAGULATION CONSULT NOTE - Follow Up Consult  Pharmacy Consult for Heparin Indication: h/o afib/flutter and DVT prophx s/p hip surgery  No Known Allergies  Patient Measurements: Height: 5' 2.01" (157.5 cm) Weight: 155 lb 0 oz (70.3 kg) IBW/kg (Calculated) : 50.12  Vital Signs: Temp: 98 F (36.7 C) (11/12 0714) Temp Source: Oral (11/12 0714) BP: 95/51 (11/12 0714) Pulse Rate: 96 (11/12 0714)  Labs: Recent Labs    01/11/17 0309  01/12/17 0312 01/12/17 1516 01/13/17 0412  HGB 8.2*  --  8.5*  --  8.3*  HCT 24.1*  --  25.3*  --  24.6*  PLT 108*  --  125*  --  136*  APTT 91*  --  126* 85* 91*  HEPARINUNFRC  --    < > 0.94* 0.72* 0.69  CREATININE 6.90*  --  7.20*  --  7.90*   < > = values in this interval not displayed.    Estimated Creatinine Clearance: 4.4 mL/min (A) (by C-G formula based on SCr of 7.9 mg/dL (H)).  Assessment:  Anticoag: Eliquis PTA for Afib/flutter, held d/t fall and need for hip surgery. Now s/p Ortho procedure on 11/5> back to Eliquis and given a dose> now to transition back to Heparin. Hgb 8.3 stable. Plts 136 up slightly. HL 0.69, aPTT 91 in goal.  Goal of Therapy:  Heparin level 0.3-0.7 units/ml  aptt 66-102 Monitor platelets by anticoagulation protocol: Yes   Plan:  IV heparin 400 units/hr Monitor daily heparin level / aPTT, CBC  Alannah Averhart S. Alford Highland, PharmD, [facility] Clinical Staff Pharmacist Pager [phone]  Eilene Ghazi Stillinger 01/13/2017,10:01 AM

## 2017-01-13 NOTE — Telephone Encounter (Signed)
Spoke with [name], she states pt is still in the hospital but would like to see if SN would be the attending physician for pt. SN please advise.

## 2017-01-15 ENCOUNTER — Encounter (HOSPITAL_COMMUNITY): Payer: Medicare Other

## 2017-01-17 ENCOUNTER — Encounter (HOSPITAL_COMMUNITY): Payer: Medicare Other

## 2017-01-20 ENCOUNTER — Encounter (HOSPITAL_COMMUNITY): Payer: Medicare Other

## 2017-01-21 ENCOUNTER — Telehealth: Payer: Self-pay

## 2017-01-21 NOTE — Telephone Encounter (Signed)
On 01/21/17 I received a d/c from [name] (original). The d/c is for burial. The patient is a patient of Doctor Lenna Gilford. The d/c will be taken to Pulmonary Unit ([id number]) when Doctor Lenna Gilford comes back from vacation.  On 01/27/17 I received the d/c back from Doctor Lenna Gilford. I got the d/c ready and called the funeral home to let them know the d/c is ready for pickup.

## 2017-01-22 ENCOUNTER — Encounter (HOSPITAL_COMMUNITY): Payer: Medicare Other

## 2017-01-27 ENCOUNTER — Encounter (HOSPITAL_COMMUNITY): Payer: Medicare Other

## 2017-01-29 ENCOUNTER — Encounter (HOSPITAL_COMMUNITY): Payer: Medicare Other

## 2017-01-31 ENCOUNTER — Encounter (HOSPITAL_COMMUNITY): Payer: Medicare Other

## 2017-02-01 DEATH — deceased

## 2017-03-27 ENCOUNTER — Ambulatory Visit: Payer: Medicare Other | Admitting: Pulmonary Disease

## 2021-04-04 DEATH — deceased
